# Patient Record
Sex: Female | Born: 1950
Health system: Southern US, Community
[De-identification: ages and names within clinical notes are randomized; demographics above are authoritative.]

## PROBLEM LIST (undated history)

## (undated) DIAGNOSIS — I10 Essential (primary) hypertension: Secondary | ICD-10-CM

## (undated) DIAGNOSIS — I341 Nonrheumatic mitral (valve) prolapse: Secondary | ICD-10-CM

## (undated) DIAGNOSIS — R7303 Prediabetes: Secondary | ICD-10-CM

## (undated) DIAGNOSIS — E119 Type 2 diabetes mellitus without complications: Secondary | ICD-10-CM

## (undated) DIAGNOSIS — E785 Hyperlipidemia, unspecified: Secondary | ICD-10-CM

## (undated) DIAGNOSIS — N200 Calculus of kidney: Secondary | ICD-10-CM

## (undated) HISTORY — DX: Nonrheumatic mitral (valve) prolapse: I34.1

## (undated) HISTORY — DX: Hyperlipidemia, unspecified: E78.5

## (undated) HISTORY — PX: TONSILLECTOMY AND ADENOIDECTOMY: SUR1326

## (undated) HISTORY — PX: OTHER SURGICAL HISTORY: SHX169

## (undated) HISTORY — DX: Calculus of kidney: N20.0

## (undated) HISTORY — PX: VAGINAL HYSTERECTOMY: SUR661

## (undated) HISTORY — DX: Type 2 diabetes mellitus without complications: E11.9

---

## 1978-07-08 HISTORY — PX: VAGINAL HYSTERECTOMY: SUR661

## 1997-12-27 ENCOUNTER — Encounter (INDEPENDENT_AMBULATORY_CARE_PROVIDER_SITE_OTHER): Payer: Self-pay | Admitting: Internal Medicine

## 1997-12-27 LAB — CONVERTED CEMR LAB: Pap Smear: NORMAL

## 1999-03-09 ENCOUNTER — Encounter (INDEPENDENT_AMBULATORY_CARE_PROVIDER_SITE_OTHER): Payer: Self-pay | Admitting: Internal Medicine

## 1999-03-09 LAB — CONVERTED CEMR LAB: Pap Smear: NORMAL

## 1999-03-19 ENCOUNTER — Other Ambulatory Visit: Admission: RE | Admit: 1999-03-19 | Discharge: 1999-03-19 | Payer: Self-pay | Admitting: Internal Medicine

## 2001-05-08 ENCOUNTER — Encounter (INDEPENDENT_AMBULATORY_CARE_PROVIDER_SITE_OTHER): Payer: Self-pay | Admitting: Internal Medicine

## 2001-05-08 LAB — CONVERTED CEMR LAB: Pap Smear: NORMAL

## 2001-05-28 ENCOUNTER — Other Ambulatory Visit: Admission: RE | Admit: 2001-05-28 | Discharge: 2001-05-28 | Payer: Self-pay | Admitting: Family Medicine

## 2004-01-06 ENCOUNTER — Encounter (INDEPENDENT_AMBULATORY_CARE_PROVIDER_SITE_OTHER): Payer: Self-pay | Admitting: Internal Medicine

## 2004-01-06 LAB — CONVERTED CEMR LAB
Pap Smear: NORMAL
Pap Smear: NORMAL

## 2004-01-25 ENCOUNTER — Other Ambulatory Visit: Admission: RE | Admit: 2004-01-25 | Discharge: 2004-01-25 | Payer: Self-pay | Admitting: Internal Medicine

## 2004-06-05 ENCOUNTER — Ambulatory Visit: Payer: Self-pay | Admitting: Family Medicine

## 2004-12-05 ENCOUNTER — Ambulatory Visit: Payer: Self-pay | Admitting: Internal Medicine

## 2005-01-28 ENCOUNTER — Emergency Department (HOSPITAL_COMMUNITY): Admission: EM | Admit: 2005-01-28 | Discharge: 2005-01-28 | Payer: Self-pay | Admitting: Emergency Medicine

## 2005-01-29 ENCOUNTER — Ambulatory Visit: Payer: Self-pay | Admitting: Internal Medicine

## 2005-03-05 ENCOUNTER — Ambulatory Visit: Payer: Self-pay | Admitting: Family Medicine

## 2005-03-13 ENCOUNTER — Ambulatory Visit: Payer: Self-pay | Admitting: Family Medicine

## 2005-06-07 ENCOUNTER — Ambulatory Visit: Payer: Self-pay | Admitting: Family Medicine

## 2005-06-18 ENCOUNTER — Ambulatory Visit: Payer: Self-pay | Admitting: Family Medicine

## 2005-12-12 ENCOUNTER — Ambulatory Visit: Payer: Self-pay | Admitting: Family Medicine

## 2006-08-01 ENCOUNTER — Ambulatory Visit: Payer: Self-pay | Admitting: Family Medicine

## 2006-08-25 ENCOUNTER — Ambulatory Visit: Payer: Self-pay | Admitting: Family Medicine

## 2006-08-27 ENCOUNTER — Ambulatory Visit: Payer: Self-pay | Admitting: Family Medicine

## 2006-11-10 ENCOUNTER — Telehealth (INDEPENDENT_AMBULATORY_CARE_PROVIDER_SITE_OTHER): Payer: Self-pay | Admitting: Internal Medicine

## 2006-11-11 ENCOUNTER — Ambulatory Visit: Payer: Self-pay | Admitting: Internal Medicine

## 2006-11-11 DIAGNOSIS — R002 Palpitations: Secondary | ICD-10-CM | POA: Insufficient documentation

## 2006-11-11 DIAGNOSIS — M8580 Other specified disorders of bone density and structure, unspecified site: Secondary | ICD-10-CM | POA: Insufficient documentation

## 2006-11-11 DIAGNOSIS — E785 Hyperlipidemia, unspecified: Secondary | ICD-10-CM | POA: Insufficient documentation

## 2006-11-12 ENCOUNTER — Ambulatory Visit: Payer: Self-pay | Admitting: Cardiology

## 2006-11-17 ENCOUNTER — Ambulatory Visit: Payer: Self-pay | Admitting: Cardiology

## 2006-12-08 ENCOUNTER — Ambulatory Visit: Payer: Self-pay | Admitting: Cardiology

## 2006-12-23 ENCOUNTER — Encounter: Payer: Self-pay | Admitting: Internal Medicine

## 2006-12-23 DIAGNOSIS — R87619 Unspecified abnormal cytological findings in specimens from cervix uteri: Secondary | ICD-10-CM | POA: Insufficient documentation

## 2006-12-23 DIAGNOSIS — F17200 Nicotine dependence, unspecified, uncomplicated: Secondary | ICD-10-CM | POA: Insufficient documentation

## 2006-12-23 DIAGNOSIS — N200 Calculus of kidney: Secondary | ICD-10-CM | POA: Insufficient documentation

## 2006-12-23 DIAGNOSIS — I059 Rheumatic mitral valve disease, unspecified: Secondary | ICD-10-CM | POA: Insufficient documentation

## 2006-12-25 ENCOUNTER — Ambulatory Visit: Payer: Self-pay | Admitting: Family Medicine

## 2006-12-25 DIAGNOSIS — E7800 Pure hypercholesterolemia, unspecified: Secondary | ICD-10-CM | POA: Insufficient documentation

## 2006-12-25 DIAGNOSIS — E669 Obesity, unspecified: Secondary | ICD-10-CM

## 2006-12-29 LAB — CONVERTED CEMR LAB
ALT: 28 units/L (ref 0–40)
AST: 22 units/L (ref 0–37)
BUN: 8 mg/dL (ref 6–23)
Basophils Absolute: 0 10*3/uL (ref 0.0–0.1)
Chloride: 111 meq/L (ref 96–112)
Cholesterol: 227 mg/dL (ref 0–200)
Eosinophils Absolute: 0.3 10*3/uL (ref 0.0–0.6)
GFR calc non Af Amer: 79 mL/min
HCT: 42.4 % (ref 36.0–46.0)
MCHC: 34.4 g/dL (ref 30.0–36.0)
MCV: 89 fL (ref 78.0–100.0)
Monocytes Absolute: 0.6 10*3/uL (ref 0.2–0.7)
Monocytes Relative: 8.6 % (ref 3.0–11.0)
Neutrophils Relative %: 46.8 % (ref 43.0–77.0)
Potassium: 4.7 meq/L (ref 3.5–5.1)
RBC: 4.76 M/uL (ref 3.87–5.11)
Sodium: 144 meq/L (ref 135–145)
Total CHOL/HDL Ratio: 4.7
Triglycerides: 95 mg/dL (ref 0–149)

## 2007-03-11 ENCOUNTER — Ambulatory Visit: Payer: Self-pay | Admitting: Internal Medicine

## 2007-03-17 ENCOUNTER — Ambulatory Visit: Payer: Self-pay | Admitting: Family Medicine

## 2007-03-17 ENCOUNTER — Encounter (INDEPENDENT_AMBULATORY_CARE_PROVIDER_SITE_OTHER): Payer: Self-pay | Admitting: Internal Medicine

## 2007-03-17 ENCOUNTER — Other Ambulatory Visit: Admission: RE | Admit: 2007-03-17 | Discharge: 2007-03-17 | Payer: Self-pay | Admitting: Family Medicine

## 2007-03-17 DIAGNOSIS — R030 Elevated blood-pressure reading, without diagnosis of hypertension: Secondary | ICD-10-CM | POA: Insufficient documentation

## 2007-03-20 ENCOUNTER — Encounter (INDEPENDENT_AMBULATORY_CARE_PROVIDER_SITE_OTHER): Payer: Self-pay | Admitting: Internal Medicine

## 2007-03-31 ENCOUNTER — Encounter (INDEPENDENT_AMBULATORY_CARE_PROVIDER_SITE_OTHER): Payer: Self-pay | Admitting: *Deleted

## 2007-03-31 LAB — CONVERTED CEMR LAB
AST: 18 units/L (ref 0–37)
Albumin: 3.7 g/dL (ref 3.5–5.2)
Basophils Absolute: 0 10*3/uL (ref 0.0–0.1)
Basophils Relative: 0.2 % (ref 0.0–1.0)
CO2: 31 meq/L (ref 19–32)
Chloride: 109 meq/L (ref 96–112)
Creatinine, Ser: 0.9 mg/dL (ref 0.4–1.2)
Eosinophils Relative: 5.5 % — ABNORMAL HIGH (ref 0.0–5.0)
Glucose, Bld: 97 mg/dL (ref 70–99)
HCT: 42 % (ref 36.0–46.0)
LDL Cholesterol: 106 mg/dL — ABNORMAL HIGH (ref 0–99)
Neutrophils Relative %: 49.1 % (ref 43.0–77.0)
RBC: 4.64 M/uL (ref 3.87–5.11)
RDW: 13.1 % (ref 11.5–14.6)
Sodium: 145 meq/L (ref 135–145)
TSH: 1.79 microintl units/mL (ref 0.35–5.50)
Total Bilirubin: 0.8 mg/dL (ref 0.3–1.2)
Total CHOL/HDL Ratio: 4
VLDL: 19 mg/dL (ref 0–40)
WBC: 7.8 10*3/uL (ref 4.5–10.5)

## 2007-05-05 ENCOUNTER — Telehealth (INDEPENDENT_AMBULATORY_CARE_PROVIDER_SITE_OTHER): Payer: Self-pay | Admitting: *Deleted

## 2007-08-31 ENCOUNTER — Telehealth (INDEPENDENT_AMBULATORY_CARE_PROVIDER_SITE_OTHER): Payer: Self-pay | Admitting: *Deleted

## 2007-09-14 ENCOUNTER — Ambulatory Visit: Payer: Self-pay | Admitting: Family Medicine

## 2007-09-15 LAB — CONVERTED CEMR LAB
ALT: 21 units/L (ref 0–35)
AST: 19 units/L (ref 0–37)
Total CHOL/HDL Ratio: 3.2

## 2007-09-24 ENCOUNTER — Encounter (INDEPENDENT_AMBULATORY_CARE_PROVIDER_SITE_OTHER): Payer: Self-pay | Admitting: Internal Medicine

## 2007-09-24 ENCOUNTER — Ambulatory Visit: Payer: Self-pay | Admitting: Family Medicine

## 2007-09-28 ENCOUNTER — Encounter (INDEPENDENT_AMBULATORY_CARE_PROVIDER_SITE_OTHER): Payer: Self-pay | Admitting: Internal Medicine

## 2007-09-28 DIAGNOSIS — N63 Unspecified lump in unspecified breast: Secondary | ICD-10-CM | POA: Insufficient documentation

## 2007-10-05 ENCOUNTER — Ambulatory Visit: Payer: Self-pay | Admitting: Family Medicine

## 2007-10-05 ENCOUNTER — Encounter (INDEPENDENT_AMBULATORY_CARE_PROVIDER_SITE_OTHER): Payer: Self-pay | Admitting: Internal Medicine

## 2008-01-14 ENCOUNTER — Encounter (INDEPENDENT_AMBULATORY_CARE_PROVIDER_SITE_OTHER): Payer: Self-pay | Admitting: Internal Medicine

## 2008-01-14 ENCOUNTER — Ambulatory Visit: Payer: Self-pay | Admitting: Family Medicine

## 2008-01-14 DIAGNOSIS — I10 Essential (primary) hypertension: Secondary | ICD-10-CM | POA: Insufficient documentation

## 2008-01-15 ENCOUNTER — Telehealth (INDEPENDENT_AMBULATORY_CARE_PROVIDER_SITE_OTHER): Payer: Self-pay | Admitting: Internal Medicine

## 2008-02-15 ENCOUNTER — Ambulatory Visit: Payer: Self-pay | Admitting: Family Medicine

## 2008-03-21 ENCOUNTER — Ambulatory Visit: Payer: Self-pay | Admitting: Family Medicine

## 2008-03-22 LAB — CONVERTED CEMR LAB
AST: 19 units/L (ref 0–37)
Alkaline Phosphatase: 62 units/L (ref 39–117)
HDL: 36.1 mg/dL — ABNORMAL LOW (ref >39.0)
LDL Cholesterol: 80 mg/dL (ref 0–99)
Total Bilirubin: 0.9 mg/dL (ref 0.3–1.2)
Total CHOL/HDL Ratio: 4

## 2008-05-17 ENCOUNTER — Ambulatory Visit: Payer: Self-pay | Admitting: Family Medicine

## 2008-05-17 DIAGNOSIS — L989 Disorder of the skin and subcutaneous tissue, unspecified: Secondary | ICD-10-CM | POA: Insufficient documentation

## 2008-05-17 DIAGNOSIS — L909 Atrophic disorder of skin, unspecified: Secondary | ICD-10-CM | POA: Insufficient documentation

## 2008-05-17 DIAGNOSIS — L919 Hypertrophic disorder of the skin, unspecified: Secondary | ICD-10-CM

## 2008-06-21 ENCOUNTER — Encounter (INDEPENDENT_AMBULATORY_CARE_PROVIDER_SITE_OTHER): Payer: Self-pay | Admitting: Internal Medicine

## 2008-08-25 ENCOUNTER — Ambulatory Visit: Payer: Self-pay | Admitting: Family Medicine

## 2008-08-25 ENCOUNTER — Encounter (INDEPENDENT_AMBULATORY_CARE_PROVIDER_SITE_OTHER): Payer: Self-pay | Admitting: Internal Medicine

## 2008-09-15 ENCOUNTER — Encounter (INDEPENDENT_AMBULATORY_CARE_PROVIDER_SITE_OTHER): Payer: Self-pay | Admitting: Internal Medicine

## 2008-09-20 ENCOUNTER — Ambulatory Visit: Payer: Self-pay | Admitting: Family Medicine

## 2008-09-20 LAB — CONVERTED CEMR LAB
AST: 18 units/L (ref 0–37)
Albumin: 3.7 g/dL (ref 3.5–5.2)
BUN: 10 mg/dL (ref 6–23)
Calcium: 9.3 mg/dL (ref 8.4–10.5)
Cholesterol: 144 mg/dL (ref 0–200)
GFR calc non Af Amer: 68.56 mL/min (ref >60)
HDL: 45.4 mg/dL (ref >39.00)
LDL Cholesterol: 79 mg/dL (ref 0–99)
Potassium: 4.3 meq/L (ref 3.5–5.1)
Total Bilirubin: 1 mg/dL (ref 0.3–1.2)
VLDL: 19.2 mg/dL (ref 0.0–40.0)

## 2008-09-28 ENCOUNTER — Ambulatory Visit: Payer: Self-pay | Admitting: Family Medicine

## 2008-10-05 ENCOUNTER — Encounter (INDEPENDENT_AMBULATORY_CARE_PROVIDER_SITE_OTHER): Payer: Self-pay | Admitting: Internal Medicine

## 2008-10-05 ENCOUNTER — Ambulatory Visit: Payer: Self-pay | Admitting: Family Medicine

## 2008-10-13 ENCOUNTER — Encounter (INDEPENDENT_AMBULATORY_CARE_PROVIDER_SITE_OTHER): Payer: Self-pay | Admitting: *Deleted

## 2008-10-13 ENCOUNTER — Encounter (INDEPENDENT_AMBULATORY_CARE_PROVIDER_SITE_OTHER): Payer: Self-pay | Admitting: Internal Medicine

## 2008-10-18 ENCOUNTER — Telehealth (INDEPENDENT_AMBULATORY_CARE_PROVIDER_SITE_OTHER): Payer: Self-pay | Admitting: Internal Medicine

## 2009-01-05 ENCOUNTER — Ambulatory Visit: Payer: Self-pay | Admitting: Family Medicine

## 2009-08-11 ENCOUNTER — Ambulatory Visit: Payer: Self-pay | Admitting: Family Medicine

## 2009-08-11 DIAGNOSIS — M48061 Spinal stenosis, lumbar region without neurogenic claudication: Secondary | ICD-10-CM | POA: Insufficient documentation

## 2009-08-11 DIAGNOSIS — S335XXA Sprain of ligaments of lumbar spine, initial encounter: Secondary | ICD-10-CM

## 2009-10-11 ENCOUNTER — Telehealth: Payer: Self-pay | Admitting: Family Medicine

## 2009-11-20 ENCOUNTER — Telehealth (INDEPENDENT_AMBULATORY_CARE_PROVIDER_SITE_OTHER): Payer: Self-pay | Admitting: *Deleted

## 2009-11-21 ENCOUNTER — Ambulatory Visit: Payer: Self-pay | Admitting: Family Medicine

## 2009-11-22 LAB — CONVERTED CEMR LAB
Albumin: 3.9 g/dL (ref 3.5–5.2)
BUN: 13 mg/dL (ref 6–23)
Calcium: 9.1 mg/dL (ref 8.4–10.5)
Cholesterol: 145 mg/dL (ref 0–200)
Creatinine, Ser: 0.8 mg/dL (ref 0.4–1.2)
GFR calc non Af Amer: 73.93 mL/min (ref >60)
Glucose, Bld: 86 mg/dL (ref 70–99)
HDL: 47.4 mg/dL (ref >39.00)
LDL Cholesterol: 79 mg/dL (ref 0–99)
Potassium: 4.7 meq/L (ref 3.5–5.1)
Total Bilirubin: 0.5 mg/dL (ref 0.3–1.2)
Triglycerides: 92 mg/dL (ref 0.0–149.0)
VLDL: 18.4 mg/dL (ref 0.0–40.0)

## 2009-11-27 ENCOUNTER — Ambulatory Visit: Payer: Self-pay | Admitting: Family Medicine

## 2009-11-27 ENCOUNTER — Other Ambulatory Visit: Admission: RE | Admit: 2009-11-27 | Discharge: 2009-11-27 | Payer: Self-pay | Admitting: Family Medicine

## 2009-11-28 ENCOUNTER — Encounter: Payer: Self-pay | Admitting: Family Medicine

## 2009-11-28 ENCOUNTER — Ambulatory Visit: Payer: Self-pay | Admitting: Family Medicine

## 2009-11-29 ENCOUNTER — Encounter: Payer: Self-pay | Admitting: Family Medicine

## 2009-11-30 ENCOUNTER — Encounter: Payer: Self-pay | Admitting: Family Medicine

## 2009-11-30 LAB — CONVERTED CEMR LAB

## 2010-05-25 ENCOUNTER — Ambulatory Visit: Payer: Self-pay | Admitting: Family Medicine

## 2010-05-25 DIAGNOSIS — J0100 Acute maxillary sinusitis, unspecified: Secondary | ICD-10-CM | POA: Insufficient documentation

## 2010-08-05 LAB — CONVERTED CEMR LAB
BUN: 8 mg/dL (ref 6–23)
CO2: 31 meq/L (ref 19–32)
Calcium: 9.2 mg/dL (ref 8.4–10.5)
Chloride: 104 meq/L (ref 96–112)
Creatinine, Ser: 0.9 mg/dL (ref 0.4–1.2)
HCT: 40.9 % (ref 36.0–46.0)
MCHC: 34.5 g/dL (ref 30.0–36.0)
Potassium: 4.1 meq/L (ref 3.5–5.1)
RDW: 12.9 % (ref 11.5–14.6)
TSH: 1.23 microintl units/mL (ref 0.35–5.50)
WBC: 6.9 10*3/uL (ref 4.5–10.5)

## 2010-08-07 NOTE — Assessment & Plan Note (Signed)
Summary: ?SINUS INFECTION/CLE   Vital Signs:  Patient profile:   60 year old female Height:      64.5 inches Weight:      186 pounds BMI:     31.55 Temp:     98.6 degrees F oral Pulse rate:   72 / minute Pulse rhythm:   regular BP sitting:   130 / 90  (left arm) Cuff size:   regular  Vitals Entered By: Linde Gillis CMA Duncan Dull) (May 25, 2010 12:39 PM) CC: ? sinus infection   History of Present Illness: 60 yo here old with ? sinus infection.  Had  a cold last week- runny nose, sneezing, dry cough.  Was just letting it runs it course but over past two days, has become febrile Tmax 101.2, body aches, facial pressure and malaise.  Taking OTC decongestant and Ibuprofen with mild relief of symptoms.  Current Medications (verified): 1)  Lipitor 10 Mg Tabs (Atorvastatin Calcium) .Marland Kitchen.. 1 By Mouth Once Daily 2)  Fish Oil 1000 Mg Caps (Omega-3 Fatty Acids) .Marland Kitchen.. 1 By Mouth Daily 3)  Daily Multiple Vitamins  Tabs (Multiple Vitamin) .Marland Kitchen.. 1 By Mouth Daily 4)  Caltrate 600   Tabs (Calcium Carbonate Tabs) .... 2 Once Daily 5)  Lisinopril 10 Mg  Tabs (Lisinopril) .Marland Kitchen.. 1 Once Daily For Bp By Mouth 6)  Augmentin 875-125 Mg Tabs (Amoxicillin-Pot Clavulanate) .Marland Kitchen.. 1 By Mouth 2 Times Daily X 10 Days  Allergies (verified): No Known Drug Allergies  Past History:  Past Medical History: Last updated: 11/11/2006 Hyperlipidemia Osteopenia MVP Renal stones  Abn pap Tobacco abuse  Past Surgical History: Last updated: 11/11/2006 Hysterectomy--class IV, abn pap.  OVARIES intact   ~1977 Tonsillectomy and adenoids Extraction of renal stones  ages 40 and 50 DEXA 7/05, 08/01/06  Family History: Last updated: 12/23/2006 Father: Alive 86, CVA age 9 Mother: Alive 53, high BP Siblings: No brothers, 1 sister L&W Grandparents:  MGM died (46) osteoporosis, CHF, MI (70's/80's) MGF died (97) CVA x 1;  PGM died 24 MI (#1) - high BP PGF died 58-65 CA lung - smoker Breast CA:  M G.Aunt,  lung  Social History: Last updated: 12/25/2006 Marital Status: Married #2 - 13 years Children: 2 (30 & 24) Occupation: Museum/gallery exhibitions officer Current Smoker, 1/2 PPD---quit 6/08 Alcohol use-yes, occasional Regular exercise-yes, walks   Risk Factors: Alcohol Use: <1 (09/28/2008) Caffeine Use: 3 (09/28/2008) Exercise: no (09/28/2008)  Risk Factors: Smoking Status: quit (09/28/2008) Packs/Day: 3/4 (09/28/2008) Passive Smoke Exposure: no (01/14/2008)  Review of Systems      See HPI General:  Complains of fever, malaise, and sweats. ENT:  Complains of nasal congestion, postnasal drainage, sinus pressure, and sore throat. Resp:  Complains of cough; denies pleuritic, shortness of breath, sputum productive, and wheezing.  Physical Exam  General:  alert, well-developed, well-nourished, and well-hydrated.   Ears:  TMs retracted bilaterally, right > left Nose:  boggy turbinates, maxillary sinuses TTP, right> left Mouth:  pharyngeal erythema.   Neck:  No deformities, masses, or tenderness noted. Lungs:  Normal respiratory effort, chest expands symmetrically. Lungs are clear to auscultation, no crackles or wheezes. Heart:  Normal rate and regular rhythm. S1 and S2 normal without gallop, murmur, click, rub or other extra sounds. Extremities:  no edema Psych:  memory intact for recent and remote, normally interactive, good eye contact, not anxious appearing, and not depressed appearing.     Impression & Recommendations:  Problem # 1:  ACUTE MAXILLARY SINUSITIS (ICD-461.0) Assessment New Given duration  and progression of symtpoms, will treat for bacterial sinusitis with Augmentin. Supportive care as per pt instructions. Her updated medication list for this problem includes:    Augmentin 875-125 Mg Tabs (Amoxicillin-pot clavulanate) .Marland Kitchen... 1 by mouth 2 times daily x 10 days  Complete Medication List: 1)  Lipitor 10 Mg Tabs (Atorvastatin calcium) .Marland Kitchen.. 1 by mouth once daily 2)  Fish Oil  1000 Mg Caps (Omega-3 fatty acids) .Marland Kitchen.. 1 by mouth daily 3)  Daily Multiple Vitamins Tabs (Multiple vitamin) .Marland Kitchen.. 1 by mouth daily 4)  Caltrate 600 Tabs (Calcium carbonate tabs) .... 2 once daily 5)  Lisinopril 10 Mg Tabs (Lisinopril) .Marland Kitchen.. 1 once daily for bp by mouth 6)  Augmentin 875-125 Mg Tabs (Amoxicillin-pot clavulanate) .Marland Kitchen.. 1 by mouth 2 times daily x 10 days  Patient Instructions: 1)  Take antibiotic as directed.  Drink lots of fluids.  Treat sympotmatically with Mucinex, nasal saline irrigation, and Tylenol/Ibuprofen. Also try claritin D or zyrtec D over the counter- two times a day as needed ( have to sign for them at pharmacy). You can use warm compresses.  Cough suppressant at night. Call if not improving as expected in 5-7 days.  Prescriptions: AUGMENTIN 875-125 MG TABS (AMOXICILLIN-POT CLAVULANATE) 1 by mouth 2 times daily x 10 days  #20 x 0   Entered and Authorized by:   Ruthe Mannan MD   Signed by:   Ruthe Mannan MD on 05/25/2010   Method used:   [facility]        [facility]  [address] [id number]       [address]       [place], [place]  [address]       Ph: [phone] or [phone]       Fax: [phone]   RxID:   [id number]    Orders Added: 1)  Est. Patient Level III [36644]    Current Allergies (reviewed today): No known allergies

## 2010-08-07 NOTE — Letter (Signed)
Summary: Results Follow up Letter  [place] at [facility]  [address] [place], [place] [address]   Phone: [phone]  Fax: [phone]    11/30/2009 MRN: [id number]  [facility] [address] [place], [place]  [address]  Dear Ms. Oblinger,  The following are the results of your recent test(s):  Test         Result    Pap Smear:        Normal __X___  Not Normal _____ Comments: ______________________________________________________ Cholesterol: LDL(Bad cholesterol):         Your goal is less than:         HDL (Good cholesterol):       Your goal is more than: Comments:  ______________________________________________________ Mammogram:        Normal _____  Not Normal _____ Comments:  ___________________________________________________________________ Hemoccult:        Normal _____  Not normal _______ Comments:    _____________________________________________________________________ Other Tests:    We routinely do not discuss normal results over the telephone.  If you desire a copy of the results, or you have any questions about this information we can discuss them at your next office visit.   Sincerely,        Ruthe Mannan, MD

## 2010-08-07 NOTE — Letter (Signed)
Summary: Results Follow up Letter  [place] at [facility]  [address] [place], [place] [address]   Phone: [phone]  Fax: [phone]    11/29/2009 MRN: [id number]  [facility] [address] [place], [place]  [address]  Dear Ms. Shiner,  The following are the results of your recent test(s):  Test         Result    Pap Smear:        Normal _____  Not Normal _____ Comments: ______________________________________________________ Cholesterol: LDL(Bad cholesterol):         Your goal is less than:         HDL (Good cholesterol):       Your goal is more than: Comments:  ______________________________________________________ Mammogram:        Normal __X___  Not Normal _____ Comments:  ___________________________________________________________________ Hemoccult:        Normal _____  Not normal _______ Comments:    _____________________________________________________________________ Other Tests:    We routinely do not discuss normal results over the telephone.  If you desire a copy of the results, or you have any questions about this information we can discuss them at your next office visit.   Sincerely,       Ruthe Mannan, MD

## 2010-08-07 NOTE — Assessment & Plan Note (Signed)
Summary: LOWER BACK PAIN/BILLIES PATIENT/[facility]   Vital Signs:  Patient profile:   60 year old female Height:      64.5 inches Weight:      182.50 pounds BMI:     30.95 Temp:     98.5 degrees F oral Pulse rate:   80 / minute Pulse rhythm:   regular BP sitting:   140 / 90  (left arm) Cuff size:   large  Vitals Entered By: Delilah Shan CMA Duncan Dull) (August 11, 2009 2:06 PM) CC: LBP   History of Present Illness: 60 yo female with 4 days of low back pain. No known trauma or injury. Woke up with pain in her lower back, bilateral. Worse when sitting or standing, tends to do better with walking. No tingling or weakness into her legs. No urinary incontinence. Never had anything like this before. No fevers. Taking Ibuprofen 800 mg three times a day with very minimal relief of symptoms.  Has been under a lot of stress, husband recently diagnosed with bladder cancer.  Current Medications (verified): 1)  Lipitor 10 Mg Tabs (Atorvastatin Calcium) .Marland Kitchen.. 1 By Mouth Once Daily 2)  Fish Oil 1000 Mg Caps (Omega-3 Fatty Acids) .Marland Kitchen.. 1 By Mouth Daily 3)  Daily Multiple Vitamins  Tabs (Multiple Vitamin) .Marland Kitchen.. 1 By Mouth Daily 4)  Caltrate 600   Tabs (Calcium Carbonate Tabs) .... 2 Once Daily 5)  Lisinopril 10 Mg  Tabs (Lisinopril) .Marland Kitchen.. 1 Once Daily For Bp By Mouth 6)  Soma 350 Mg Tabs (Carisoprodol) .Marland Kitchen.. 1 Tab By Mouth Three Times A Day As Needed Back Pain  Allergies (verified): No Known Drug Allergies  Review of Systems      See HPI General:  Denies chills, fatigue, fever, and malaise. GU:  Denies incontinence and urinary frequency. MS:  Complains of low back pain; denies loss of strength and muscle weakness.  Physical Exam  General:  alert, well-developed, well-nourished, and well-hydrated.   Lungs:     Msk:  Back: Normal inspection, no tenderness to palpation over paraspinous muscles, very tight lower lumbar parapspinous muscles. FROM -extension and flexion. SLR neg bilaterally. Neg  fabers bilaterally.   Impression & Recommendations:  Problem # 1:  LUMBAR STRAIN, ACUTE (ICD-847.2) Assessment New No red flag symptoms. Will give muscle relaxant, encourage exercises, Ibuprofen and heat. See pt instrucitons for details. To let me know if develops any red flag symptoms, including radiculopathy that would indicate need for possible imaging.  Complete Medication List: 1)  Lipitor 10 Mg Tabs (Atorvastatin calcium) .Marland Kitchen.. 1 by mouth once daily 2)  Fish Oil 1000 Mg Caps (Omega-3 fatty acids) .Marland Kitchen.. 1 by mouth daily 3)  Daily Multiple Vitamins Tabs (Multiple vitamin) .Marland Kitchen.. 1 by mouth daily 4)  Caltrate 600 Tabs (Calcium carbonate tabs) .... 2 once daily 5)  Lisinopril 10 Mg Tabs (Lisinopril) .Marland Kitchen.. 1 once daily for bp by mouth 6)  Soma 350 Mg Tabs (Carisoprodol) .Marland Kitchen.. 1 tab by mouth three times a day as needed back pain  Patient Instructions: 1)  Most patients (90%) with low back pain will improve with time ( 2-6 weeks). Keep active but avoid activities that are painful. Apply moist heat and/or ice to lower back several times a day.  2)  Soma three times a day as needed (muscle relaxant). 3)  Tell Mr. Mccartney I said hello and keep smiling! Prescriptions: SOMA 350 MG TABS (CARISOPRODOL) 1 tab by mouth three times a day as needed back pain  #30 x 0  Entered and Authorized by:   Ruthe Mannan MD   Signed by:   Ruthe Mannan MD on 08/11/2009   Method used:   [facility]        [facility]  [address]       [address]       [place], [place]  [address]       Ph: [phone] or [phone]       Fax: [phone]   RxID:   [id number]   Current Allergies (reviewed today): No known allergies

## 2010-08-07 NOTE — Progress Notes (Signed)
Summary: refill requests for lipitor, lisinopril  Phone Note Refill Request Message from:  Fax from Pharmacy  Refills Requested: Medication #1:  LISINOPRIL 10 MG  TABS 1 once daily for BP by mouth  Medication #2:  LIPITOR 10 MG TABS 1 by mouth once daily Faxed forms from [facility] are on your desk.  Initial call taken by: Lowella Petties CMA,  October 11, 2009 8:57 AM  Follow-up for Phone Call        On my desk. Follow-up by: Ruthe Mannan MD,  October 11, 2009 9:39 AM  Additional Follow-up for Phone Call Additional follow up Details #1::        Faxed Additional Follow-up by: Delilah Shan CMA (AAMA),  October 11, 2009 11:13 AM

## 2010-08-07 NOTE — Progress Notes (Signed)
----   Converted from flag ---- ---- 11/20/2009 9:33 AM, Ruthe Mannan MD wrote: BMET (401.9), fasting lipid/hepatic panel (272.0)  ---- 11/20/2009 8:14 AM, Liane Comber CMA (AAMA) wrote:  Pt is scheduled for cpx labs tomorrow, what labs to draw and dx codes? Thanks Tasha ------------------------------

## 2010-08-07 NOTE — Assessment & Plan Note (Signed)
Summary: cpx and estabh from [facility]   Vital Signs:  Patient profile:   60 year old female Height:      64.5 inches Weight:      183.13 pounds BMI:     31.06 Temp:     98.6 degrees F oral Pulse rate:   76 / minute Pulse rhythm:   regular BP sitting:   120 / 80  (left arm) Cuff size:   regular  Vitals Entered By: Linde Gillis CMA Duncan Dull) (Nov 27, 2009 8:36 AM) CC: complete physical, establish for Billie   History of Present Illness: 60 yo here to establish care and CPX.  HDL- well controlled on Lipitor 10 mg daily. HDL 47, LDL 79, TG 92. No myalgias or other side effects. Working on diet as well.  Osteopenia- dexa in 08/2008- osteopenia. Taking Catriate 1200 mg daily.  Well woman- due for tetanus, colonoscopy, mammogram. Had a hysterectomy due to heavy bleeding in 1976 but unsure if she still has cervix.  Current Medications (verified): 1)  Lipitor 10 Mg Tabs (Atorvastatin Calcium) .Marland Kitchen.. 1 By Mouth Once Daily 2)  Fish Oil 1000 Mg Caps (Omega-3 Fatty Acids) .Marland Kitchen.. 1 By Mouth Daily 3)  Daily Multiple Vitamins  Tabs (Multiple Vitamin) .Marland Kitchen.. 1 By Mouth Daily 4)  Caltrate 600   Tabs (Calcium Carbonate Tabs) .... 2 Once Daily 5)  Lisinopril 10 Mg  Tabs (Lisinopril) .Marland Kitchen.. 1 Once Daily For Bp By Mouth  Allergies (verified): No Known Drug Allergies  Past History:  Past Medical History: Last updated: 11/11/2006 Hyperlipidemia Osteopenia MVP Renal stones  Abn pap Tobacco abuse  Past Surgical History: Last updated: 11/11/2006 Hysterectomy--class IV, abn pap.  OVARIES intact   ~1977 Tonsillectomy and adenoids Extraction of renal stones  ages 34 and 69 DEXA 7/05, 08/01/06  Family History: Last updated: 12/23/2006 Father: Alive 6, CVA age 36 Mother: Alive 44, high BP Siblings: No brothers, 1 sister L&W Grandparents:  MGM died (60) osteoporosis, CHF, MI (70's/80's) MGF died (39) CVA x 1;  PGM died 37 MI (#1) - high BP PGF died 75-65 CA lung - smoker Breast CA:  M  G.Aunt, lung  Social History: Last updated: 12/25/2006 Marital Status: Married #2 - 13 years Children: 2 (30 & 24) Occupation: Museum/gallery exhibitions officer Current Smoker, 1/2 PPD---quit 6/08 Alcohol use-yes, occasional Regular exercise-yes, walks   Risk Factors: Alcohol Use: <1 (09/28/2008) Caffeine Use: 3 (09/28/2008) Exercise: no (09/28/2008)  Risk Factors: Smoking Status: quit (09/28/2008) Packs/Day: 3/4 (09/28/2008) Passive Smoke Exposure: no (01/14/2008)  Family History: Reviewed history from 12/23/2006 and no changes required. Father: Alive 12, CVA age 36 Mother: Alive 61, high BP Siblings: No brothers, 1 sister L&W Grandparents:  MGM died (72) osteoporosis, CHF, MI (70's/80's) MGF died (28) CVA x 1;  PGM died 11 MI (#1) - high BP PGF died 22-65 CA lung - smoker Breast CA:  M G.Aunt, lung  Social History: Reviewed history from 12/25/2006 and no changes required. Marital Status: Married #2 - 13 years Children: 2 (30 & 24) Occupation: realestate appraisor Current Smoker, 1/2 PPD---quit 6/08 Alcohol use-yes, occasional Regular exercise-yes, walks   Review of Systems      See HPI General:  Denies malaise. Eyes:  Denies blurring. ENT:  Denies difficulty swallowing. CV:  Denies chest pain or discomfort and difficulty breathing at night. Resp:  Denies shortness of breath. GI:  Denies abdominal pain, bloody stools, and change in bowel habits. GU:  Denies abnormal vaginal bleeding. MS:  Denies joint pain, joint redness,  and joint swelling. Derm:  Denies rash. Neuro:  Denies headaches. Psych:  Denies anxiety and depression. Endo:  Denies cold intolerance and heat intolerance. Heme:  Denies abnormal bruising and bleeding.  Physical Exam  General:  alert, well-developed, well-nourished, and well-hydrated.   Head:  Normocephalic and atraumatic without obvious abnormalities. No apparent alopecia or balding. Eyes:  vision grossly intact, pupils equal, and pupils round.     Ears:  R ear normal and L ear normal.   Nose:  no external deformity.   Mouth:  good dentition and no gingival abnormalities.   Breasts:  No mass, nodules, thickening, tenderness, bulging, retraction, inflamation, nipple discharge or skin changes noted.   Lungs:  Normal respiratory effort, chest expands symmetrically. Lungs are clear to auscultation, no crackles or wheezes. Heart:  Normal rate and regular rhythm. S1 and S2 normal without gallop, murmur, click, rub or other extra sounds. Abdomen:  Bowel sounds positive,abdomen soft and non-tender without masses, organomegaly or hernias noted. Genitalia:  Pelvic Exam:        External: normal female genitalia without lesions or masses        Vagina: normal without lesions or masses        Cervix: absent        Adnexa: normal bimanual exam without masses or fullness        Uterus: absent        Pap smear: performed Extremities:  no edema Neurologic:  alert & oriented X3.   Skin:  Intact without suspicious lesions or rashes Psych:  memory intact for recent and remote, normally interactive, good eye contact, not anxious appearing, and not depressed appearing.     Impression & Recommendations:  Problem # 1:  WELL ADULT (ICD-V70.0) Reviewed preventive care protocols, scheduled due services, and updated immunizations Discussed nutrition, exercise, diet, and healthy lifestyle.  tetanus today. Set up mammogram and colonscopy today.  Complete Medication List: 1)  Lipitor 10 Mg Tabs (Atorvastatin calcium) .Marland Kitchen.. 1 by mouth once daily 2)  Fish Oil 1000 Mg Caps (Omega-3 fatty acids) .Marland Kitchen.. 1 by mouth daily 3)  Daily Multiple Vitamins Tabs (Multiple vitamin) .Marland Kitchen.. 1 by mouth daily 4)  Caltrate 600 Tabs (Calcium carbonate tabs) .... 2 once daily 5)  Lisinopril 10 Mg Tabs (Lisinopril) .Marland Kitchen.. 1 once daily for bp by mouth  Other Orders: Pap Smear, Thin Prep ( Collection of) (G2694) Radiology Referral (Radiology) Gastroenterology Referral (GI) Tdap =>  54yrs IM ([address]462) Admin 1st Vaccine (70350)  Patient Instructions: 1)  Great to see you. 2)  Please tell Mr. Levings that I said hello. 3)  Please stop by to see Shirlee Limerick on your way out to set up your mammogram and colonoscopy.  Current Allergies (reviewed today): No known allergies     Tetanus Vaccine (to be given today)

## 2010-10-11 ENCOUNTER — Other Ambulatory Visit: Payer: Self-pay | Admitting: *Deleted

## 2010-10-11 MED ORDER — ATORVASTATIN CALCIUM 10 MG PO TABS: 10.0000 mg | ORAL_TABLET | Freq: Every day | ORAL | Status: DC

## 2010-10-11 MED ORDER — LISINOPRIL 10 MG PO TABS: 10.0000 mg | ORAL_TABLET | Freq: Every day | ORAL | Status: DC

## 2010-10-19 ENCOUNTER — Encounter: Payer: Self-pay | Admitting: Family Medicine

## 2010-10-19 LAB — HM MAMMOGRAPHY

## 2010-10-24 ENCOUNTER — Encounter: Payer: Self-pay | Admitting: Family Medicine

## 2010-10-24 ENCOUNTER — Ambulatory Visit (INDEPENDENT_AMBULATORY_CARE_PROVIDER_SITE_OTHER): Payer: Managed Care, Other (non HMO) | Admitting: Family Medicine

## 2010-10-24 DIAGNOSIS — L821 Other seborrheic keratosis: Secondary | ICD-10-CM | POA: Insufficient documentation

## 2010-10-24 DIAGNOSIS — M722 Plantar fascial fibromatosis: Secondary | ICD-10-CM

## 2010-10-24 NOTE — Progress Notes (Signed)
60 yo here for:  1.  Spot on right face- noticed it several months ago, has not changed in size. Has a similar one on her stomach for years.  Neither one are growing in size, bleeding or itching.  2.  Left foot pain- started several weeks ago.  Hurts from heel to mid foot. First step of the morning is the worst. No swelling, redness or LE weakness.  The PMH, PSH, Social History, Family History, Medications, and allergies have been reviewed in [facility], and have been updated if relevant..  ROS: See HPI  PHysical exam:  BP 110/70  Pulse 60  Temp(Src) 98.3 F (36.8 C) (Oral)  Ht 5\' 6"  (1.676 m)  Wt 186 lb 1.9 oz (84.423 kg)  BMI 30.04 kg/m2  General:  Well-developed,well-nourished,in no acute distress; alert,appropriate and cooperative throughout examination Head:  normocephalic and atraumatic.   Eyes:  vision grossly intact, pupils equal, pupils round, and pupils reactive to light.   Ears:  R ear normal and L ear normal.   Nose:  no external deformity.   Mouth:  good dentition.   Neck:  No deformities, masses, or tenderness noted. Msk:  No deformity of feet, can illict pain with streching foot from left toes, normal stability. Extremities:  No clubbing, cyanosis, edema, or deformity noted with normal full range of motion of all joints.   Neurologic:  alert & oriented X3 and gait normal.   Skin:  I.5 cm lesion on right face Cervical Nodes:  No lymphadenopathy noted Axillary Nodes:  No palpable lymphadenopathy Psych:  Cognition and judgment appear intact. Alert and cooperative with normal attention span and concentration. No apparent delusions, illusions, hallucinations

## 2010-10-24 NOTE — Assessment & Plan Note (Signed)
New. Reassurance provided. Discussed need for immediate follow up if lesion changes in size, color, or shape.

## 2010-10-24 NOTE — Patient Instructions (Addendum)
Plantar Fasciitis Plantar fasciitis is a common condition that causes foot pain. It is soreness (inflammation) of the band of tough fibrous tissue on the bottom of the foot that runs from the heel bone (calcaneus) to the ball of the foot. The cause of this soreness may be from excessive standing, poor fitting shoes, running on hard surfaces, being overweight, having an abnormal walk, or overuse (this is common in runners) of the painful foot or feet. It is also common in aerobic exercise dancers and ballet dancers.  SYMPTOMS Most people with plantar fasciitis complain of:  Severe pain in the morning on the bottom of their foot especially when taking the first steps out of bed. This pain recedes after a few minutes of walking.   Severe pain is experienced also during walking following a long period of inactivity.   Pain is worse when walking barefoot or up stairs  DIAGNOSIS  Your caregiver will diagnose this condition by examining and feeling your foot.   Special tests such as x-rays of your foot, are usually not needed.  PREVENTION  Consult a sports medicine professional before beginning a new exercise program.   Walking programs offer a good workout. With walking there is a lower chance of overuse injuries common to runners. There is less impact and less jarring of the joints.   Begin all new exercise programs slowly. If problems or pain develop, decrease the amount of time or distance until you are at a comfortable level.   Wear good shoes and replace them regularly.   Stretch your foot and the heel cords at the back of the ankle (Achilles tendon) both before and after exercise.   Run or exercise on even surfaces that are not hard. For example, asphalt is better than pavement.   Do not run barefoot on hard surfaces.   If using a treadmill, vary the incline.   Do not continue to workout if you have foot or joint problems. Seek professional help if they do not improve.  HOME CARE  INSTRUCTIONS  Avoid activities that cause you pain until you recover.   Use ice or cold packs on the problem or painful areas after working out.   Only take over-the-counter or prescription medicines for pain, discomfort, or fever as directed by your caregiver.   Soft shoe inserts or athletic shoes with air or gel sole cushions may be helpful.   If problems continue or become more severe, consult a sports medicine caregiver or your own health care provider. Cortisone is a potent anti-inflammatory medication that may be injected into the painful area. You can discuss this treatment with your caregiver.  MAKE SURE YOU:  Understand these instructions.   Will watch your condition.   Will get help right away if you are not doing well or get worse.    [email or web address]

## 2010-10-24 NOTE — Assessment & Plan Note (Signed)
New. Discussed treatment plan. See pt instructions for details.

## 2010-11-20 NOTE — Assessment & Plan Note (Signed)
[facility]                            [place] OFFICE NOTE   NAME:Jacqueline [name], Jacqueline [name]                           MRN:          [id number]  DATE:11/12/2006                            DOB:          05/13/1951    PRIMARY CARE PHYSICIAN:  Arta Silence, M.D.   REASON FOR PRESENTATION:  Evaluate patient with palpitations.   HISTORY OF PRESENT ILLNESS:  Patient is a pleasant, 60 year old white  female.  She does have a history of mitral valve prolapse, documented  about 20 years ago.  She has not been bothered by this over the years  and has taken no medications for this.  She presents because she has had  about two weeks of rapid heartbeat.  This occurs sporadically.  It may  happen a couple of times a day, or not at all.  It usually happens at  rest.  She says she will feel her heart pounding.  It may go on for 10-  30 minutes.  On one occasion, she felt nauseated.  She has felt a little  light-headed, but had no presyncope or syncope.  She has had no  vomiting.  She has had no shortness of breath.  She has no chest  pressure, neck or arm discomfort.  She cannot bring these on.  She  simply waits and they go away.  She has never had symptoms like this  before.   PAST MEDICAL HISTORY:  Borderline dyslipidemia, tobacco abuse.   PAST SURGICAL HISTORY:  Hysterectomy.   ALLERGIES:  None.   MEDICATIONS:  1. Lipitor 10 mg daily.  2. Multivitamin.  3. Calcium.  4. Fish oil.   SOCIAL HISTORY:  The patient is married.  She has two children.  She is  an Event organiser.  She has been smoking cigarettes for 20 years and  currently smokes about a quarter pack per day.  She rarely drinks  alcohol.   FAMILY HISTORY:  Noncontributory for early coronary artery disease,  sudden cardiac death, heart failure, syncope.   REVIEW OF SYSTEMS:  As stated in the HPI and negative for other systems.   PHYSICAL EXAM:  The patient is in no distress.  Blood pressure 142/86,  heart rate 68 and regular.  HEENT:  Eyes unremarkable.  Pupils were equal, round and react to light.  Fundi within normal limits.  Oral mucosa unremarkable.  NECK:  No jugular venous distention, wave form within normal limits.  Carotid upstroke brisk and symmetric.  No bruits, no thyromegaly.  LYMPHATICS:  No cervical, axillary or inguinal adenopathy.  LUNGS:  Clear to auscultation bilaterally.  BACK:  No costovertebral angle tenderness.  CHEST:  Unremarkable.  HEART:  PMI not displaced or sustained.  S1 and S2 within normal limits.  No S3, no S4, no clicks, no rubs, no murmurs.  ABDOMEN:  Flat, positive bowel sounds, normal in frequency and pitch.  No bruits, rebound, guarding.  No midline pulse, no mass, no  hepatomegaly, no splenomegaly.  SKIN:  No rashes, no nodules.  EXTREMITIES:  Two-plus pulses throughout, no edema, no cyanosis,  no  clubbing.  NEUROLOGIC:  Oriented to person, place and time.  Cranial nerves II  through XII grossly intact.  Motor grossly intact throughout.   EKG (Nov 11, 2006):  Sinus rhythm, axis within normal limits, intervals  within normal limits, no acute STT-wave changes.   ASSESSMENT AND PLAN:  1. Palpitations:  Patient has palpitations that may be a      supraventricular tachycardia.  She has had some labs drawn, to      include thyroid.  These are pending.  She is going to get a two-      week event monitor.  We discussed vagal maneuvers that could help.  2. Tobacco:  We had a long discussion about the need to stop smoking      (greater than three minutes).  She was given a prescription for      Chantix.  3. Followup:  I will see her back in about three weeks or sooner, if      she has any symptomatic tachyarrhythmias in need of immediate      attention.     Rollene Rotunda, MD, [facility]     JH/MedQ  DD: 11/12/2006  DT: 11/12/2006  Job #: [id number]   cc:   Willaim Sheng D. Bean, FNP

## 2010-11-20 NOTE — Assessment & Plan Note (Signed)
[facility]                            [place] OFFICE NOTE   NAME:Jacqueline [name], Jacqueline [name]                           MRN:          [id number]  DATE:12/08/2006                            DOB:          20-Feb-1951    PRIMARY CARE PHYSICIAN:  Dr. Laurita Quint.   REASON FOR PRESENTATION:  Patient palpitations.   HISTORY OF PRESENT ILLNESS:  The patient is a pleasant 60 year old who  presents for evaluation of palpitations.  She did wear a 2-week event  monitor.  She had occasional premature ventricular contractions and  premature atrial contractions which she did feel.  Mostly, these were at  rest.  She said overall she thinks she is having much less in the way of  palpitations.  She has not had any presyncope or syncope.  She is not  particularly bothered by these.  She has had no chest pressure or  shortness of breath.  She is cutting down on her cigarettes  significantly though she did not get her Chantix prescription yet.   PAST MEDICAL HISTORY:  Borderline dyslipidemia, borderline hypertension,  tobacco abuse, hysterectomy.   ALLERGIES:  None.   CURRENT MEDICATIONS:  1. Lipitor 10 q.h.s.  2. Multivitamin, calcium, fish oil.   REVIEW OF SYSTEMS:  As stated in the HPI and otherwise negative for any  other systems.   PHYSICAL EXAMINATION:  The patient is in no distress, her blood pressure  142/82, heart rate 76 and regular.  NECK:  No jugular venous distention at 45 degrees, no thyromegaly.  LUNGS:  Clear to auscultation bilaterally.  HEART:  PMI not displaced or sustained, S1 and S2 within normal limits,  no S3, S4, clicks, rubs, murmurs.  ABDOMEN:  Obese, positive bowel sounds normal in frequency, pitch, no  bruits, no rebound, no guarding, or midline pulsatile mass, no  organomegaly.  SKIN:  No rashes.  EXTREMITIES:  2+ pulse, no edema.   ASSESSMENT AND PLAN:  1. Palpitations.  We discussed the nature of these premature      ventricular  contractions.  They are not particularly bothering her.      At this point, she is going to let me know if she has any      increasing symptoms.  No further cardiovascular testing is planned.  2. Hypertension.  She has got borderline hypertension.  She is going      to keep an eye on this.  If in the future she is consistently      hypertensive I would start with therapeutic lifestyle changes.  She      needs to lose weight over time anyway.  She will pay attention to      this, though it may be difficult while she tries to stop smoking.  3. Tobacco.  This is her number one issue.  She is planning on      quitting altogether and I encouraged her to use the Chantix.  4. Followup will be as needed.     Rollene Rotunda, MD, [facility]  Electronically Signed    JH/MedQ  DD:  12/08/2006  DT: 12/08/2006  Job #: [id number]   cc:   Arta Silence, MD

## 2010-12-26 ENCOUNTER — Telehealth: Payer: Self-pay | Admitting: Family Medicine

## 2010-12-26 DIAGNOSIS — Z1231 Encounter for screening mammogram for malignant neoplasm of breast: Secondary | ICD-10-CM

## 2010-12-26 NOTE — Telephone Encounter (Signed)
Please place an order for a MMG at [facility].

## 2011-02-01 ENCOUNTER — Other Ambulatory Visit: Payer: Self-pay | Admitting: Family Medicine

## 2011-02-05 ENCOUNTER — Ambulatory Visit: Payer: Self-pay | Admitting: Family Medicine

## 2011-02-06 ENCOUNTER — Encounter: Payer: Self-pay | Admitting: Family Medicine

## 2011-02-07 ENCOUNTER — Encounter: Payer: Self-pay | Admitting: *Deleted

## 2011-03-08 ENCOUNTER — Ambulatory Visit (INDEPENDENT_AMBULATORY_CARE_PROVIDER_SITE_OTHER): Payer: Managed Care, Other (non HMO) | Admitting: Family Medicine

## 2011-03-08 ENCOUNTER — Encounter: Payer: Self-pay | Admitting: Family Medicine

## 2011-03-08 VITALS — BP 124/78 | HR 76 | Temp 98.0°F | Wt 192.8 lb

## 2011-03-08 DIAGNOSIS — M545 Low back pain, unspecified: Secondary | ICD-10-CM | POA: Insufficient documentation

## 2011-03-08 MED ORDER — NAPROXEN 500 MG PO TABS: ORAL_TABLET | ORAL | Status: DC

## 2011-03-08 NOTE — Patient Instructions (Signed)
I think this is sacroiliitis. Treat with naprosyn twice daily for 1 week then as needed (with food). We will set you up with physical therapy - pass by Marion's office to set this up. If not improving after treatment or any worsening, let us know for orthopedic referral.

## 2011-03-08 NOTE — Assessment & Plan Note (Signed)
Consistent with sacroiliitis, provided with stretching exercises from [facility] pt advisor. Will set up with PT, start naprosyn twice daily with food for 1 wk then as needed. If not improving with PT, pt will call us for referral to ortho for possible dx/tx SIJ injection. Pt agrees with plan.

## 2011-03-08 NOTE — Progress Notes (Signed)
  Subjective:    Patient ID: Jacqueline [name], female    DOB: 08-14-1950, 60 y.o.   MRN: [id number]  HPI CC: back pain  Describes about 4-5 mo h/o progressively worsening lower back pain described as dull achey constant pain, also feels popping in neck when walking.  Ibuprofen makes pain bearable.  Not positional really.  In mornings worse.  Has tempurpedic matress.  In here 2-3 mo ago for foot pain.  Back was bothering her then.  Told to walk and do exercises.  Foot got much better.  Back has not.    No radiculopathy, although does endorse mild left sided lateral shooting pain down to knee.  No fevers/chills, bowel/bladder incontinence.  Doesn't remember inciting event/trauma.  Has tried back stretching exercises including laying on back and bending hip/knee, sitting on wall, and raising legs when laying down, also walking.  Has tried ice which helped but then pain returns.  Taking ibuprofen 400mg  bid.  Review of Systems Per HPI    Objective:   Physical Exam  Nursing note and vitals reviewed. Constitutional: She appears well-developed and well-nourished. No distress.  HENT:  Head: Normocephalic and atraumatic.  Musculoskeletal: Normal range of motion. She exhibits no edema.       No midline thoracic, lumbar spinal pain.  + mild midline sacral pain.  No paraspinous mm tenderness. + pain worse at left SI joint. + FABER mildly on left. Neg SLR bilaterally. No pain with int/ext rotation at hips. No GTB pain or pain at sciatic notch.  Neurological: No sensory deficit.       Diminished DTRs througout  Skin: Skin is warm and dry. No rash noted.  Psychiatric: She has a normal mood and affect.    Mild L faber.      Assessment & Plan:

## 2011-03-13 ENCOUNTER — Encounter: Payer: Self-pay | Admitting: Family Medicine

## 2011-04-08 ENCOUNTER — Encounter: Payer: Self-pay | Admitting: Family Medicine

## 2011-08-28 ENCOUNTER — Ambulatory Visit (INDEPENDENT_AMBULATORY_CARE_PROVIDER_SITE_OTHER): Payer: Managed Care, Other (non HMO) | Admitting: Family Medicine

## 2011-08-28 ENCOUNTER — Encounter: Payer: Self-pay | Admitting: Family Medicine

## 2011-08-28 VITALS — BP 118/72 | HR 80 | Temp 98.2°F | Ht 66.0 in | Wt 188.8 lb

## 2011-08-28 DIAGNOSIS — A499 Bacterial infection, unspecified: Secondary | ICD-10-CM

## 2011-08-28 DIAGNOSIS — J329 Chronic sinusitis, unspecified: Secondary | ICD-10-CM

## 2011-08-28 DIAGNOSIS — B9689 Other specified bacterial agents as the cause of diseases classified elsewhere: Secondary | ICD-10-CM

## 2011-08-28 DIAGNOSIS — F17200 Nicotine dependence, unspecified, uncomplicated: Secondary | ICD-10-CM

## 2011-08-28 MED ORDER — AMOXICILLIN-POT CLAVULANATE 875-125 MG PO TABS: 1.0000 | ORAL_TABLET | Freq: Two times a day (BID) | ORAL | Status: AC

## 2011-08-28 NOTE — Progress Notes (Signed)
Subjective:    Patient ID: Jacqueline [name], female    DOB: July 25, 1950, 61 y.o.   MRN: [id number]  HPI Had a cold last week Got better and then got worse Now since Monday - a lot of facial pain - behind and under eyes  Worse when she bends forward Green nasal discharge  Was running fever last night - a little over 100   Still coughing a bit   Smoker too  She is working on cessation with Dr Dayton Martes Has cut down to 5 cig per day  Habit is the problem- does not crave nicotine  Needs to set a quit date   Husband smokes - this makes the process harder as he does not want to quit  Patient Active Problem List  Diagnoses  . HYPERCHOLESTEROLEMIA, PURE  . HYPERLIPIDEMIA  . OBESITY  . CIGARETTE SMOKER  . HYPERTENSION  . MITRAL VALVE PROLAPSE  . RENAL CALCULUS  . BREAST MASS  . SKIN TAG  . SKIN LESION  . OSTEOPENIA  . PALPITATIONS  . PAP SMEAR, ABNORMAL  . ELEVATED BLOOD PRESSURE WITHOUT DIAGNOSIS OF HYPERTENSION  . LUMBAR STRAIN, ACUTE  . Keratosis, seborrheic  . Plantar fascia syndrome  . Lower back pain  . Bacterial sinusitis   Past Medical History  Diagnosis Date  . Hyperlipidemia   . Osteoporosis     Osteopenia  . MVP (mitral valve prolapse)   . Calcium oxalate renal stones   . Abnormal Pap smear of vagina   . Tobacco abuse    Past Surgical History  Procedure Date  . Vaginal hysterectomy     class IV, ovaries intact  . Tonsillectomy and adenoidectomy   . Extraction of renal stones    History  Substance Use Topics  . Smoking status: Current Everyday Smoker -- 0.5 packs/day    Types: Cigarettes  . Smokeless tobacco: Not on file  . Alcohol Use: Yes   Family History  Problem Relation Age of Onset  . Hypertension Mother   . Stroke Father 64  . Cancer Maternal Aunt     breast and lung  . Heart disease Maternal Grandmother   . Heart disease Maternal Grandfather   . Hypertension Paternal Grandmother    No Known Allergies Current Outpatient Prescriptions on  File Prior to Visit  Medication Sig Dispense Refill  . Calcium Carbonate (CALTRATE 600 PO) Take 2 tablets by mouth daily.        . fish oil-omega-3 fatty acids 1000 MG capsule Take 1 g by mouth daily.        Marland Kitchen ibuprofen (ADVIL,MOTRIN) 200 MG tablet Take 800 mg by mouth 2 (two) times daily.        Marland Kitchen LIPITOR 10 MG tablet TAKE 1 TABLET (10 MG) BY MOUTH DAILY  90 tablet  3  . lisinopril (PRINIVIL,ZESTRIL) 10 MG tablet Take 1 tablet (10 mg total) by mouth daily.  90 tablet  0  . Multiple Vitamin (MULTIVITAMIN) tablet Take 1 tablet by mouth daily.        . naproxen (NAPROSYN) 500 MG tablet Take one po bid x 1 week then prn pain, take with food  60 tablet  0     Review of Systems Review of Systems  Constitutional: Negative for  unexpected weight change. pos for malaise and fever Eyes: Negative for pain and visual disturbance neg for red or swollen eyes ENT neg for st or ear pain .  Respiratory: Negative for sob/ pos for cough / neg for  wheeze   Cardiovascular: Negative for cp or palpitations    Gastrointestinal: Negative for nausea, diarrhea and constipation.  Genitourinary: Negative for urgency and frequency.  Skin: Negative for pallor or rash   Neurological: Negative for weakness, light-headedness, numbness and headaches.  Hematological: Negative for adenopathy. Does not bruise/bleed easily.  Psychiatric/Behavioral: Negative for dysphoric mood. The patient is not nervous/anxious.          Objective:   Physical Exam  Constitutional: She appears well-developed and well-nourished. No distress.  HENT:  Head: Normocephalic and atraumatic.  Right Ear: External ear normal.  Left Ear: External ear normal.  Mouth/Throat: Oropharynx is clear and moist. No oropharyngeal exudate.       Nares are injected and congested  Pos sinus TTP Throat- post nasal drip yellow mucous  Eyes: Conjunctivae and EOM are normal. Pupils are equal, round, and reactive to light. Right eye exhibits no discharge. Left  eye exhibits no discharge.  Neck: Normal range of motion. Neck supple. No JVD present. No thyromegaly present.  Cardiovascular: Normal rate, regular rhythm and normal heart sounds.   Pulmonary/Chest: Effort normal and breath sounds normal. No respiratory distress. She has no wheezes.       Diffusely distant bs Harsh bs throughout   Lymphadenopathy:    She has no cervical adenopathy.  Neurological: She is alert. She has normal reflexes. She exhibits normal muscle tone. Coordination normal.  Skin: Skin is warm and dry. No rash noted. No erythema.  Psychiatric: She has a normal mood and affect.          Assessment & Plan:

## 2011-08-28 NOTE — Assessment & Plan Note (Signed)
Disc in detail risks of smoking and possible outcomes including copd, vascular/ heart disease, cancer , respiratory and sinus infections  Pt voices understanding  She plans to set a quit date Cut down to 5 per day

## 2011-08-28 NOTE — Assessment & Plan Note (Signed)
In smoker s/p uri with sinus pain /fever/ purulent drainage tx with augmentin Disc symptomatic care - see instructions on AVS  Also handout given Update if not starting to improve in a week or if worsening  Will try to quit smoking

## 2011-08-28 NOTE — Patient Instructions (Signed)
Drink lots of fluids Use nasal saline spray for congestion Set a smoking quit date  Take the augmentin as directed mucinex is helpful to loosen mucous also   Sinusitis Sinuses are air pockets within the bones of your face. The growth of bacteria within a sinus leads to infection. The infection prevents the sinuses from draining. This infection is called sinusitis. SYMPTOMS   There will be different areas of pain depending on which sinuses have become infected.  The maxillary sinuses often produce pain beneath the eyes.     Frontal sinusitis may cause pain in the middle of the forehead and above the eyes.  Other problems (symptoms) include:  Toothaches.     Colored, pus-like (purulent) drainage from the nose.     Swelling, warmth, and tenderness over the sinus areas may be signs of infection.  TREATMENT   Sinusitis is most often determined by an exam.X-rays may be taken. If x-rays have been taken, make sure you obtain your results or find out how you are to obtain them. Your caregiver may give you medications (antibiotics). These are medications that will help kill the bacteria causing the infection. You may also be given a medication (decongestant) that helps to reduce sinus swelling.   HOME CARE INSTRUCTIONS    Only take over-the-counter or prescription medicines for pain, discomfort, or fever as directed by your caregiver.     Drink extra fluids. Fluids help thin the mucus so your sinuses can drain more easily.     Applying either moist heat or ice packs to the sinus areas may help relieve discomfort.     Use saline nasal sprays to help moisten your sinuses. The sprays can be found at your local drugstore.  SEEK IMMEDIATE MEDICAL CARE IF:  You have a fever.     You have increasing pain, severe headaches, or toothache.     You have nausea, vomiting, or drowsiness.     You develop unusual swelling around the face or trouble seeing.  MAKE SURE YOU:    Understand these  instructions.     Will watch your condition.     Will get help right away if you are not doing well or get worse.  Document Released: 06/24/2005 Document Revised: 03/06/2011 Document Reviewed: 01/21/2007 [facility] Patient Information 2012 [place], [place].Sinusitis Sinuses are air pockets within the bones of your face. The growth of bacteria within a sinus leads to infection. The infection prevents the sinuses from draining. This infection is called sinusitis. SYMPTOMS   There will be different areas of pain depending on which sinuses have become infected.  The maxillary sinuses often produce pain beneath the eyes.     Frontal sinusitis may cause pain in the middle of the forehead and above the eyes.  Other problems (symptoms) include:  Toothaches.     Colored, pus-like (purulent) drainage from the nose.     Swelling, warmth, and tenderness over the sinus areas may be signs of infection.  TREATMENT   Sinusitis is most often determined by an exam.X-rays may be taken. If x-rays have been taken, make sure you obtain your results or find out how you are to obtain them. Your caregiver may give you medications (antibiotics). These are medications that will help kill the bacteria causing the infection. You may also be given a medication (decongestant) that helps to reduce sinus swelling.   HOME CARE INSTRUCTIONS    Only take over-the-counter or prescription medicines for pain, discomfort, or fever as directed by your  caregiver.     Drink extra fluids. Fluids help thin the mucus so your sinuses can drain more easily.     Applying either moist heat or ice packs to the sinus areas may help relieve discomfort.     Use saline nasal sprays to help moisten your sinuses. The sprays can be found at your local drugstore.  SEEK IMMEDIATE MEDICAL CARE IF:  You have a fever.     You have increasing pain, severe headaches, or toothache.     You have nausea, vomiting, or drowsiness.     You develop  unusual swelling around the face or trouble seeing.  MAKE SURE YOU:    Understand these instructions.     Will watch your condition.     Will get help right away if you are not doing well or get worse.  Document Released: 06/24/2005 Document Revised: 03/06/2011 Document Reviewed: 01/21/2007 [facility] Patient Information 2012 [place], [place].

## 2011-09-20 ENCOUNTER — Ambulatory Visit (INDEPENDENT_AMBULATORY_CARE_PROVIDER_SITE_OTHER): Payer: Managed Care, Other (non HMO) | Admitting: Family Medicine

## 2011-09-20 ENCOUNTER — Encounter: Payer: Self-pay | Admitting: Family Medicine

## 2011-09-20 VITALS — BP 120/78 | HR 73 | Temp 98.1°F | Ht 66.0 in | Wt 190.4 lb

## 2011-09-20 DIAGNOSIS — J40 Bronchitis, not specified as acute or chronic: Secondary | ICD-10-CM

## 2011-09-20 MED ORDER — HYDROCOD POLST-CHLORPHEN POLST 10-8 MG/5ML PO LQCR: 5.0000 mL | Freq: Every evening | ORAL | Status: DC | PRN

## 2011-09-20 MED ORDER — AZITHROMYCIN 250 MG PO TABS: ORAL_TABLET | ORAL | Status: AC

## 2011-09-20 NOTE — Progress Notes (Signed)
SUBJECTIVE:  Jacqueline [name] is a 61 y.o. female who complains of productive cough, myalgias and headache for 3 weeks. She denies a history of anorexia, chest pain, fatigue, shortness of breath, sweats, vomiting and weakness and denies a history of asthma. Patient admits to smoke cigarettes but has not felt well enough to smoke last several weeks.   Saw Dr. Milinda Antis last month, treated for sinusitis with Augmentin.  Sinus symptoms resolved but cough seems to have progressed since then. No CP, SOB only while coughing.   Patient Active Problem List  Diagnoses  . HYPERCHOLESTEROLEMIA, PURE  . HYPERLIPIDEMIA  . OBESITY  . CIGARETTE SMOKER  . HYPERTENSION  . MITRAL VALVE PROLAPSE  . RENAL CALCULUS  . BREAST MASS  . SKIN TAG  . SKIN LESION  . OSTEOPENIA  . PALPITATIONS  . PAP SMEAR, ABNORMAL  . ELEVATED BLOOD PRESSURE WITHOUT DIAGNOSIS OF HYPERTENSION  . LUMBAR STRAIN, ACUTE  . Keratosis, seborrheic  . Plantar fascia syndrome  . Lower back pain  . Bacterial sinusitis   Past Medical History  Diagnosis Date  . Hyperlipidemia   . Osteoporosis     Osteopenia  . MVP (mitral valve prolapse)   . Calcium oxalate renal stones   . Abnormal Pap smear of vagina   . Tobacco abuse    Past Surgical History  Procedure Date  . Vaginal hysterectomy     class IV, ovaries intact  . Tonsillectomy and adenoidectomy   . Extraction of renal stones    History  Substance Use Topics  . Smoking status: Current Everyday Smoker -- 0.5 packs/day    Types: Cigarettes  . Smokeless tobacco: Not on file  . Alcohol Use: Yes   Family History  Problem Relation Age of Onset  . Hypertension Mother   . Stroke Father 68  . Cancer Maternal Aunt     breast and lung  . Heart disease Maternal Grandmother   . Heart disease Maternal Grandfather   . Hypertension Paternal Grandmother    No Known Allergies Current Outpatient Prescriptions on File Prior to Visit  Medication Sig Dispense Refill  . Calcium  Carbonate (CALTRATE 600 PO) Take 2 tablets by mouth daily.        . fish oil-omega-3 fatty acids 1000 MG capsule Take 1 g by mouth daily.        Marland Kitchen ibuprofen (ADVIL,MOTRIN) 200 MG tablet Take 800 mg by mouth 2 (two) times daily.        Marland Kitchen LIPITOR 10 MG tablet TAKE 1 TABLET (10 MG) BY MOUTH DAILY  90 tablet  3  . lisinopril (PRINIVIL,ZESTRIL) 10 MG tablet Take 1 tablet (10 mg total) by mouth daily.  90 tablet  0  . Multiple Vitamin (MULTIVITAMIN) tablet Take 1 tablet by mouth daily.        . naproxen (NAPROSYN) 500 MG tablet Take one po bid x 1 week then prn pain, take with food  60 tablet  0   The PMH, PSH, Social History, Family History, Medications, and allergies have been reviewed in [facility], and have been updated if relevant.  OBJECTIVE: BP 120/78  Pulse 73  Temp(Src) 98.1 F (36.7 C) (Oral)  Ht 5\' 6"  (1.676 m)  Wt 190 lb 6.4 oz (86.365 kg)  BMI 30.73 kg/m2  SpO2 97%  She appears well, vital signs are as noted. Ears normal.  Throat and pharynx normal.  Neck supple. No adenopathy in the neck. Nose is congested. Sinuses non tender. Pos wheezes bilaterally, right >  left  ASSESSMENT:  bronchitis  PLAN: Zpack as directed.  Pt declined inhaler. Symptomatic therapy suggested: push fluids, rest and return office visit prn if symptoms persist or worsen.  Call or return to clinic prn if these symptoms worsen or fail to improve as anticipated.

## 2011-09-20 NOTE — Patient Instructions (Signed)
Take Zpack as directed.  Drink lots of fluids.  Treat sympotmatically with Mucinex, nasal saline irrigation, and Tylenol/Ibuprofen.  Cough suppressant at night. Call if not improving as expected in 5-7 days.    

## 2011-12-13 ENCOUNTER — Other Ambulatory Visit: Payer: Self-pay | Admitting: Family Medicine

## 2012-04-29 ENCOUNTER — Other Ambulatory Visit: Payer: Self-pay | Admitting: Family Medicine

## 2012-04-29 NOTE — Telephone Encounter (Signed)
Refill request for lipitor 10 mg. Last OV was over 6 mos no current labs, ok to refill?

## 2012-06-17 ENCOUNTER — Other Ambulatory Visit: Payer: Self-pay | Admitting: Family Medicine

## 2012-06-17 DIAGNOSIS — E7800 Pure hypercholesterolemia, unspecified: Secondary | ICD-10-CM

## 2012-06-17 DIAGNOSIS — I10 Essential (primary) hypertension: Secondary | ICD-10-CM

## 2012-06-18 ENCOUNTER — Other Ambulatory Visit (INDEPENDENT_AMBULATORY_CARE_PROVIDER_SITE_OTHER): Payer: Managed Care, Other (non HMO)

## 2012-06-18 DIAGNOSIS — E7800 Pure hypercholesterolemia, unspecified: Secondary | ICD-10-CM

## 2012-06-18 DIAGNOSIS — I10 Essential (primary) hypertension: Secondary | ICD-10-CM

## 2012-06-18 LAB — LIPID PANEL
Cholesterol: 147 mg/dL (ref 0–200)
LDL Cholesterol: 84 mg/dL (ref 0–99)
Total CHOL/HDL Ratio: 3
VLDL: 15.8 mg/dL (ref 0.0–40.0)

## 2012-06-18 LAB — COMPREHENSIVE METABOLIC PANEL
AST: 18 U/L (ref 0–37)
Alkaline Phosphatase: 62 U/L (ref 39–117)
Glucose, Bld: 94 mg/dL (ref 70–99)
Sodium: 137 mEq/L (ref 135–145)
Total Bilirubin: 0.8 mg/dL (ref 0.3–1.2)
Total Protein: 7.5 g/dL (ref 6.0–8.3)

## 2012-06-25 ENCOUNTER — Ambulatory Visit (INDEPENDENT_AMBULATORY_CARE_PROVIDER_SITE_OTHER): Payer: Managed Care, Other (non HMO) | Admitting: Family Medicine

## 2012-06-25 ENCOUNTER — Encounter: Payer: Self-pay | Admitting: Family Medicine

## 2012-06-25 ENCOUNTER — Encounter: Payer: Self-pay | Admitting: Internal Medicine

## 2012-06-25 ENCOUNTER — Other Ambulatory Visit: Payer: Self-pay

## 2012-06-25 VITALS — BP 136/88 | HR 76 | Temp 98.3°F | Ht 64.75 in | Wt 184.0 lb

## 2012-06-25 DIAGNOSIS — Z0000 Encounter for general adult medical examination without abnormal findings: Secondary | ICD-10-CM

## 2012-06-25 DIAGNOSIS — I10 Essential (primary) hypertension: Secondary | ICD-10-CM

## 2012-06-25 DIAGNOSIS — Z1211 Encounter for screening for malignant neoplasm of colon: Secondary | ICD-10-CM

## 2012-06-25 DIAGNOSIS — M949 Disorder of cartilage, unspecified: Secondary | ICD-10-CM

## 2012-06-25 DIAGNOSIS — E785 Hyperlipidemia, unspecified: Secondary | ICD-10-CM

## 2012-06-25 DIAGNOSIS — Z1231 Encounter for screening mammogram for malignant neoplasm of breast: Secondary | ICD-10-CM

## 2012-06-25 DIAGNOSIS — D239 Other benign neoplasm of skin, unspecified: Secondary | ICD-10-CM

## 2012-06-25 DIAGNOSIS — D229 Melanocytic nevi, unspecified: Secondary | ICD-10-CM

## 2012-06-25 DIAGNOSIS — M899 Disorder of bone, unspecified: Secondary | ICD-10-CM

## 2012-06-25 MED ORDER — MELOXICAM 15 MG PO TABS: 15.0000 mg | ORAL_TABLET | Freq: Every day | ORAL | Status: DC

## 2012-06-25 NOTE — Patient Instructions (Addendum)
Check with your insurance to see if they will cover the shingles shot. Please stop by to see Shirlee Limerick on your way out to step up your colonoscopy and dermatology referral. Please call [place] to set up your mammogram.

## 2012-06-25 NOTE — Progress Notes (Signed)
Subjective:    Patient ID: Jacqueline [name], female    DOB: 23-Feb-1951, 61 y.o.   MRN: [id number]  HPI  61 yo here for CPX.  HDL- well controlled on Lipitor 10 mg daily.  Lab Results  Component Value Date   CHOL 147 06/18/2012   HDL 46.80 06/18/2012   LDLCALC 84 06/18/2012   LDLDIRECT 149.5 12/25/2006   TRIG 79.0 06/18/2012   CHOLHDL 3 06/18/2012   Lab Results  Component Value Date   ALT 21 06/18/2012   AST 18 06/18/2012   ALKPHOS 62 06/18/2012   BILITOT 0.8 06/18/2012    No myalgias or other side effects.  Working on diet as well.   Osteopenia- dexa in 08/2008- osteopenia.  Taking Catriate 1200 mg daily.   Well woman- due for tetanus, colonoscopy, mammogram.   Had a total hysterectomy due to heavy bleeding in 1976.  Mole on right temple- getting larger over past year.  No bleeding or irritation.  Patient Active Problem List  Diagnosis  . HYPERCHOLESTEROLEMIA, PURE  . HYPERLIPIDEMIA  . OBESITY  . CIGARETTE SMOKER  . HYPERTENSION  . MITRAL VALVE PROLAPSE  . RENAL CALCULUS  . BREAST MASS  . SKIN TAG  . SKIN LESION  . OSTEOPENIA  . PALPITATIONS  . PAP SMEAR, ABNORMAL  . LUMBAR STRAIN, ACUTE  . Keratosis, seborrheic  . Plantar fascia syndrome  . Routine general medical examination at a health care facility   Past Medical History  Diagnosis Date  . Hyperlipidemia   . Osteoporosis     Osteopenia  . MVP (mitral valve prolapse)   . Calcium oxalate renal stones   . Abnormal Pap smear of vagina   . Tobacco abuse    Past Surgical History  Procedure Date  . Vaginal hysterectomy     class IV, ovaries intact  . Tonsillectomy and adenoidectomy   . Extraction of renal stones    History  Substance Use Topics  . Smoking status: Current Every Day Smoker -- 0.5 packs/day    Types: Cigarettes  . Smokeless tobacco: Not on file  . Alcohol Use: Yes   Family History  Problem Relation Age of Onset  . Hypertension Mother   . Stroke Father 46  . Cancer Maternal  Aunt     breast and lung  . Heart disease Maternal Grandmother   . Heart disease Maternal Grandfather   . Hypertension Paternal Grandmother    No Known Allergies Current Outpatient Prescriptions on File Prior to Visit  Medication Sig Dispense Refill  . atorvastatin (LIPITOR) 10 MG tablet TAKE 1 TABLET (10MG ) BY MOUTH DAILY  90 tablet  3  . Calcium Carbonate (CALTRATE 600 PO) Take 2 tablets by mouth daily.        . chlorpheniramine-HYDROcodone (TUSSIONEX PENNKINETIC ER) 10-8 MG/5ML LQCR Take 5 mLs by mouth at bedtime as needed.  140 mL  0  . fish oil-omega-3 fatty acids 1000 MG capsule Take 1 g by mouth daily.        Marland Kitchen lisinopril (PRINIVIL,ZESTRIL) 10 MG tablet TAKE 1 TABLET (10MG  TOTAL) BY MOUTH DAILY  90 tablet  1  . Multiple Vitamin (MULTIVITAMIN) tablet Take 1 tablet by mouth daily.         The PMH, PSH, Social History, Family History, Medications, and allergies have been reviewed in [facility], and have been updated if relevant.    Review of Systems See HPI Patient reports no  vision/ hearing changes,anorexia, weight change, fever ,adenopathy, persistant / recurrent hoarseness, swallowing  issues, chest pain, edema,persistant / recurrent cough, hemoptysis, dyspnea(rest, exertional, paroxysmal nocturnal), gastrointestinal  bleeding (melena, rectal bleeding), abdominal pain, excessive heart burn, GU symptoms(dysuria, hematuria, pyuria, voiding/incontinence  Issues) syncope, focal weakness, severe memory loss, concerning skin lesions, depression, anxiety, abnormal bruising/bleeding, major joint swelling, breast masses or abnormal vaginal bleeding.       Objective:   Physical Exam BP 136/88  Pulse 76  Temp 98.3 F (36.8 C)  Ht 5' 4.75" (1.645 m)  Wt 184 lb (83.462 kg)  BMI 30.86 kg/m2   General:  Well-developed,well-nourished,in no acute distress; alert,appropriate and cooperative throughout examination Head:  normocephalic and atraumatic.   Raised brown circular lesion right temple   (?Sk) Eyes:  vision grossly intact, pupils equal, pupils round, and pupils reactive to light.   Ears:  R ear normal and L ear normal.   Nose:  no external deformity.   Mouth:  good dentition.   Neck:  No deformities, masses, or tenderness noted. Breasts:  No mass, nodules, thickening, tenderness, bulging, retraction, inflamation, nipple discharge or skin changes noted.   Lungs:  Normal respiratory effort, chest expands symmetrically. Lungs are clear to auscultation, no crackles or wheezes. Heart:  Normal rate and regular rhythm. S1 and S2 normal without gallop, murmur, click, rub or other extra sounds. Abdomen:  Bowel sounds positive,abdomen soft and non-tender without masses, organomegaly or hernias noted. Msk:  No deformity or scoliosis noted of thoracic or lumbar spine.   Extremities:  No clubbing, cyanosis, edema, or deformity noted with normal full range of motion of all joints.   Neurologic:  alert & oriented X3 and gait normal.   Skin:  Intact without suspicious lesions or rashes SKs on back Cervical Nodes:  No lymphadenopathy noted Axillary Nodes:  No palpable lymphadenopathy Psych:  Cognition and judgment appear intact. Alert and cooperative with normal attention span and concentration. No apparent delusions, illusions, hallucinations       Assessment & Plan:   1. HYPERTENSION  Stable.   2. HYPERLIPIDEMIA  Well controlled on lipitor 10 mg daily.   3. OSTEOPENIA  Repeat DEXA.   4. Routine general medical examination at a health care facility  Reviewed preventive care protocols, scheduled due services, and updated immunizations Discussed nutrition, exercise, diet, and healthy lifestyle.    5. Other screening mammogram  MM Digital Screening  6. Screening for colon cancer  Ambulatory referral to Gastroenterology  7. Atypical nevus  Probably benign but since it has changed, will refer to derm. The patient indicates understanding of these issues and agrees with the plan.   Ambulatory referral to Dermatology

## 2012-06-25 NOTE — Telephone Encounter (Signed)
[facility].

## 2012-06-25 NOTE — Telephone Encounter (Signed)
Pt was seen today and [facility] does not have Meloxicam prescription; Meloxicam was [facility]. I sent Meloxicam 15 mg as instructed to [facility]. Please call [facility] to cancel Meloxicam.

## 2012-07-15 ENCOUNTER — Ambulatory Visit: Payer: Self-pay | Admitting: Family Medicine

## 2012-07-16 ENCOUNTER — Encounter: Payer: Self-pay | Admitting: Family Medicine

## 2012-07-22 ENCOUNTER — Ambulatory Visit (AMBULATORY_SURGERY_CENTER): Payer: Managed Care, Other (non HMO)

## 2012-07-22 VITALS — Ht 66.0 in | Wt 186.2 lb

## 2012-07-22 DIAGNOSIS — Z1211 Encounter for screening for malignant neoplasm of colon: Secondary | ICD-10-CM

## 2012-07-22 MED ORDER — MOVIPREP 100 G PO SOLR: ORAL | Status: DC

## 2012-07-23 ENCOUNTER — Encounter: Payer: Self-pay | Admitting: Family Medicine

## 2012-07-23 ENCOUNTER — Ambulatory Visit: Payer: Self-pay | Admitting: Family Medicine

## 2012-07-23 ENCOUNTER — Other Ambulatory Visit: Payer: Self-pay | Admitting: Family Medicine

## 2012-07-23 ENCOUNTER — Encounter: Payer: Self-pay | Admitting: Internal Medicine

## 2012-07-23 DIAGNOSIS — N63 Unspecified lump in unspecified breast: Secondary | ICD-10-CM

## 2012-08-14 ENCOUNTER — Encounter: Payer: Self-pay | Admitting: Internal Medicine

## 2012-08-14 ENCOUNTER — Ambulatory Visit (AMBULATORY_SURGERY_CENTER): Payer: Managed Care, Other (non HMO) | Admitting: Internal Medicine

## 2012-08-14 VITALS — BP 139/53 | HR 76 | Temp 98.2°F | Resp 10 | Ht 66.0 in | Wt 188.0 lb

## 2012-08-14 DIAGNOSIS — Z1211 Encounter for screening for malignant neoplasm of colon: Secondary | ICD-10-CM

## 2012-08-14 MED ORDER — SODIUM CHLORIDE 0.9 % IV SOLN: 500.0000 mL | INTRAVENOUS | Status: DC

## 2012-08-14 NOTE — Patient Instructions (Addendum)

## 2012-08-14 NOTE — Op Note (Signed)
[facility] [address] [place], [address]   COLONOSCOPY PROCEDURE REPORT  PATIENT: Jacqueline [name], Jacqueline [name]  MR#: [id number] BIRTHDATE: 02-26-1951 , 62  yrs. old GENDER: Female ENDOSCOPIST: Hart Carwin, MD REFERRED BY:  Ruthe Mannan, M.D. PROCEDURE DATE:  08/14/2012 PROCEDURE:   Colonoscopy, screening ASA CLASS:   Class I INDICATIONS:Average risk patient for colon cancer. MEDICATIONS: MAC sedation, administered by CRNA and propofol (Diprivan) 250mg  IV  DESCRIPTION OF PROCEDURE:   After the risks and benefits and of the procedure were explained, informed consent was obtained.  A digital rectal exam revealed no abnormalities of the rectum.    The LB [id number]-[id number]  endoscope was introduced through the anus and advanced to the cecum, which was identified by both the appendix and ileocecal valve .  The quality of the prep was good, using MoviPrep .  The instrument was then slowly withdrawn as the colon was fully examined.     COLON FINDINGS: Mild diverticulosis was noted.     Retroflexed views revealed no abnormalities.     The scope was then withdrawn from the patient and the procedure completed.  COMPLICATIONS: There were no complications. ENDOSCOPIC IMPRESSION: Mild diverticulosis was noted  RECOMMENDATIONS: High fiber diet   REPEAT EXAM: In 10 year(s)  for Colonoscopy.  cc:  _______________________________ eSignedHart Carwin, MD 08/14/2012 10:54 AM

## 2012-08-14 NOTE — Progress Notes (Signed)
Patient did not experience any of the following events: a burn prior to discharge; a fall within the facility; wrong site/side/patient/procedure/implant event; or a hospital transfer or hospital admission upon discharge from the facility. (G8907) Patient did not have preoperative order for IV antibiotic SSI prophylaxis. (G8918)  

## 2012-08-14 NOTE — Progress Notes (Signed)
Pt stable to RR 

## 2012-08-17 ENCOUNTER — Telehealth: Payer: Self-pay | Admitting: *Deleted

## 2012-08-17 NOTE — Telephone Encounter (Signed)
  Follow up Call-  Call back number 08/14/2012  Post procedure Call Back phone  # [phone]  Permission to leave phone message Yes     Patient questions:  Do you have a fever, pain , or abdominal swelling? no Pain Score  0 *  Have you tolerated food without any problems? yes  Have you been able to return to your normal activities? yes  Do you have any questions about your discharge instructions: Diet   no Medications  no Follow up visit  no  Do you have questions or concerns about your Care? no  Actions: * If pain score is 4 or above: No action needed, pain <4.

## 2012-08-19 HISTORY — PX: BREAST BIOPSY: SHX20

## 2012-09-08 ENCOUNTER — Telehealth: Payer: Self-pay | Admitting: Family Medicine

## 2012-09-08 ENCOUNTER — Ambulatory Visit (INDEPENDENT_AMBULATORY_CARE_PROVIDER_SITE_OTHER): Payer: Managed Care, Other (non HMO) | Admitting: Family Medicine

## 2012-09-08 ENCOUNTER — Encounter: Payer: Self-pay | Admitting: Family Medicine

## 2012-09-08 VITALS — BP 130/76 | HR 96 | Temp 98.8°F | Wt 188.0 lb

## 2012-09-08 DIAGNOSIS — J019 Acute sinusitis, unspecified: Secondary | ICD-10-CM | POA: Insufficient documentation

## 2012-09-08 MED ORDER — AMOXICILLIN-POT CLAVULANATE 875-125 MG PO TABS: 1.0000 | ORAL_TABLET | Freq: Two times a day (BID) | ORAL | Status: DC

## 2012-09-08 NOTE — Telephone Encounter (Signed)
Patient Information:  Caller Name: Soley  Phone: [phone]  Patient: Jacqueline [name], Jacqueline [name]  Gender: Female  DOB: 06-26-1951  Age: 62 Years  PCP: Ruthe Mannan [facility])  Office Follow Up:  Does the office need to follow up with this patient?: No  Instructions For The Office: N/A  RN Note:  Right sided sinus pain and congestion with low grade fever (100 po 09/07/12) during past two evenings.  Mild sinus pain at present but painful when "mashes" on cheekbone or forehead."  Symptoms  Reason For Call & Symptoms: Sinus congestion with pain in right cheekbone and forehead; thinks has a sinus infection.  Reviewed Health History In EMR: Yes  Reviewed Medications In EMR: Yes  Reviewed Allergies In EMR: Yes  Reviewed Surgeries / Procedures: Yes  Date of Onset of Symptoms: 09/05/2012  Treatments Tried: Mucinex, Ibuprofen, nasal saline  Treatments Tried Worked: Yes  Guideline(s) Used:  Sinus Pain and Congestion  Disposition Per Guideline:   See Today or Tomorrow in Office  Reason For Disposition Reached:   Using nasal washes and pain medicine > 24 hours and sinus pain (lower forehead, cheekbone, or eye) persists  Advice Given:  Reassurance:   Sinus congestion is a normal part of a cold.  Usually home treatment with nasal washes can prevent an actual bacterial sinus infection.  Antibiotics are not helpful for the sinus congestion that occurs with colds.  Here is some care advice that should help.  For a Runny Nose With Profuse Discharge:  Nasal mucus and discharge helps to wash viruses and bacteria out of the nose and sinuses.  Blowing the nose is all that is needed.  For a Stuffy Nose - Use Nasal Washes:  Introduction: Saline (salt water) nasal irrigation (nasal wash) is an effective and simple home remedy for treating stuffy nose and sinus congestion. The nose can be irrigated by pouring, spraying, or squirting salt water into the nose and then letting it run back out.  Medicines  for a Stuffy or Runny Nose:  Most cold medicines that are available over-the-counter (OTC) are not helpful.  Antihistamines: Are only helpful if you also have nasal allergies.  If you have a very runny nose and you really think you need a medicine, you can try using a nasal decongestant for a couple days.  Pain and Fever Medicines:  For pain or fever relief, take either acetaminophen or ibuprofen.  Treat fevers above 101 F (38.3 C). The goal of fever therapy is to bring the fever down to a comfortable level. Remember that fever medicine usually lowers fever 2 degrees F (1 - 1 1/2 degrees C).  Hydration:  Drink plenty of liquids (6-8 glasses of water daily). If the air in your home is dry, use a cool mist humidifier  Expected Course:  Sinus congestion from viral upper respiratory infections (colds) usually lasts 5-10 days.  Occasionally a cold can worsen and turn into bacterial sinusitis. Clues to this are sinus symptoms lasting longer than 10 days, fever lasting longer than 3 days, and worsening pain. Bacterial sinusitis may need antibiotic treatment.  Call Back If:   Severe pain lasts longer than 2 hours after pain medicine  Sinus congestion (fullness) lasts longer than 10 days  Fever lasts longer than 3 days  You become worse.  Appointment Scheduled:  09/08/2012 16:00:00 Appointment Scheduled Provider:  Crawford Givens Clelia Croft) [facility])

## 2012-09-08 NOTE — Assessment & Plan Note (Signed)
Nontoxic.  Supportive care, start augmentin.  F/u prn.  D/w pt.

## 2012-09-08 NOTE — Patient Instructions (Signed)
Start the augmentin and use nasal saline spray.  Drink plenty of fluids, take tylenol as needed, and this should gradually improve.  Take care.  Let us know if you have other concerns.

## 2012-09-08 NOTE — Progress Notes (Signed)
She's trying quit smoking.    duration of symptoms: about 1 week, worse over the last few days.  Since ~Sunday with more facial pain.  Had a fever last 2 nights.  HA, R frontal and maxillary pain.   Rhinorrhea: yes congestion:yes ear pain:no  sore throat: no Cough: minimal other concerns: taking mucinex with some help for sleep.  Has had chills at night.   ROS: See HPI.  Otherwise negative.    Meds, vitals, and allergies reviewed.   GEN: nad, alert and oriented HEENT: mucous membranes moist, TM w/o erythema, nasal epithelium injected, OP with cobblestoning, R frontal and max sinus ttp NECK: supple w/o LA CV: rrr. PULM: ctab, no inc wob EXT: no edema

## 2012-09-14 ENCOUNTER — Ambulatory Visit: Payer: Self-pay | Admitting: General Surgery

## 2012-09-29 ENCOUNTER — Other Ambulatory Visit: Payer: Self-pay | Admitting: Family Medicine

## 2012-09-29 NOTE — Telephone Encounter (Signed)
Script sent to pharmacy.

## 2013-02-15 ENCOUNTER — Encounter: Payer: Self-pay | Admitting: Family Medicine

## 2013-02-15 ENCOUNTER — Ambulatory Visit (INDEPENDENT_AMBULATORY_CARE_PROVIDER_SITE_OTHER): Payer: 59 | Admitting: Family Medicine

## 2013-02-15 VITALS — BP 114/80 | HR 100 | Temp 99.0°F | Wt 187.8 lb

## 2013-02-15 DIAGNOSIS — J029 Acute pharyngitis, unspecified: Secondary | ICD-10-CM

## 2013-02-15 DIAGNOSIS — J069 Acute upper respiratory infection, unspecified: Secondary | ICD-10-CM | POA: Insufficient documentation

## 2013-02-15 MED ORDER — LIDOCAINE VISCOUS 2 % MT SOLN: 10.0000 mL | OROMUCOSAL | Status: DC | PRN

## 2013-02-15 MED ORDER — BENZONATATE 200 MG PO CAPS: 200.0000 mg | ORAL_CAPSULE | Freq: Two times a day (BID) | ORAL | Status: DC | PRN

## 2013-02-15 NOTE — Progress Notes (Signed)
3 days of sx, worse yesterday, dry cough.  No ear pain or sinus pressure.  ST, worse with swallowing.  Hoarse. Fever noted recently, sweats and chills. Some HA. Took ibuprofen for fever, some better overall with ibuprofen.  Likely sick contacts.  HA likely from cough per patient.   Meds, vitals, and allergies reviewed.   ROS: See HPI.  Otherwise, noncontributory.  GEN: nad, alert and oriented HEENT: mucous membranes moist, tm w/o erythema, nasal exam w/o erythema, clear discharge noted,  OP with cobblestoning, sinuses not ttp NECK: supple w/o LA CV: rrr.   PULM: ctab, no inc wob EXT: no edema SKIN: no acute rash  RST neg

## 2013-02-15 NOTE — Patient Instructions (Addendum)
Use the tessalon for the cough and the lidocaine for your sore throat. Drink plenty of fluids, take tylenol as needed, and gargle with warm salt water for your throat.  This should gradually improve.  Take care.  Let us know if you have other concerns.

## 2013-02-15 NOTE — Assessment & Plan Note (Signed)
Nontoxic, likely viral, supportive tx and fu prn.  Tessalon and lidocaine in meantime. RST neg.  D/w pt.  She agrees.

## 2013-02-17 ENCOUNTER — Encounter: Payer: Self-pay | Admitting: Family Medicine

## 2013-02-22 ENCOUNTER — Telehealth: Payer: Self-pay | Admitting: *Deleted

## 2013-02-22 NOTE — Telephone Encounter (Signed)
Patient called the office wanting to know if we would be scheduling next mammogram. She received a letter from the hospital stating she is due. I could not find anything in recalls and neither could Caryl-Lyn. Please advise. Thanks.

## 2013-02-25 ENCOUNTER — Telehealth: Payer: Self-pay | Admitting: *Deleted

## 2013-02-25 NOTE — Telephone Encounter (Signed)
Right unilateral mammogram completed in March 2014.  Bilateral diagnostic studies w/ right [facility] views due in September.

## 2013-02-25 NOTE — Telephone Encounter (Signed)
Message for patient to call the office.  Patient needs to be scheduled for a September mammogram and office visit.

## 2013-02-25 NOTE — Telephone Encounter (Signed)
Jacqueline Mayotte, MD at 02/25/2013 8:30 AM   Status: Signed            Right unilateral mammogram completed in March 2014. Bilateral diagnostic studies w/ right [facility] views due in September.    I spoke with patient today and notified her accordingly.  Kim at the [facility] states that patient is not due for a bilateral study until January 2015. Her last bilateral exam was done 07-15-12. Should I place patient in recalls for January? Thanks.

## 2013-02-26 NOTE — Telephone Encounter (Signed)
Will determine timing of future mammograms after September appointment.

## 2013-03-01 ENCOUNTER — Telehealth: Payer: Self-pay | Admitting: *Deleted

## 2013-03-01 NOTE — Telephone Encounter (Signed)
Per Dr. Rutherford Nail final review, patient is due January 2015 for a bilateral diagnostic mammogram with right [facility] views and office visit to follow. A detailed message was left on patient's cell phone with this information. She will be placed in January 2015 recalls.

## 2013-03-28 ENCOUNTER — Other Ambulatory Visit: Payer: Self-pay | Admitting: Family Medicine

## 2013-04-20 ENCOUNTER — Other Ambulatory Visit: Payer: Self-pay | Admitting: Family Medicine

## 2013-05-13 ENCOUNTER — Other Ambulatory Visit: Payer: Self-pay

## 2013-07-15 ENCOUNTER — Encounter: Payer: Self-pay | Admitting: Internal Medicine

## 2013-07-15 ENCOUNTER — Ambulatory Visit (INDEPENDENT_AMBULATORY_CARE_PROVIDER_SITE_OTHER): Payer: Medicare HMO | Admitting: Internal Medicine

## 2013-07-15 VITALS — BP 118/68 | HR 91 | Temp 98.4°F | Wt 185.0 lb

## 2013-07-15 DIAGNOSIS — R05 Cough: Secondary | ICD-10-CM

## 2013-07-15 DIAGNOSIS — J209 Acute bronchitis, unspecified: Secondary | ICD-10-CM

## 2013-07-15 DIAGNOSIS — R059 Cough, unspecified: Secondary | ICD-10-CM

## 2013-07-15 DIAGNOSIS — J208 Acute bronchitis due to other specified organisms: Secondary | ICD-10-CM

## 2013-07-15 NOTE — Patient Instructions (Signed)

## 2013-07-15 NOTE — Progress Notes (Signed)
Pre-visit discussion using our clinic review tool. No additional management support is needed unless otherwise documented below in the visit note.  

## 2013-07-15 NOTE — Progress Notes (Signed)
HPI  Pt presents to the clinic today with c/o cough, fever and chest congestion. This started about 3 weeks ago. The cough is productive of thick green mucous. The cough is worse at night and first thing in the morning. She has had some nasal congestion as well.  She does report that she has been running fevers. She has taken tylenol, ibuprofen and robitussin. She has had sick contacts. She is a current smoker.   Review of Systems      Past Medical History  Diagnosis Date  . Hyperlipidemia   . Osteoporosis     Osteopenia  . MVP (mitral valve prolapse)   . Calcium oxalate renal stones   . Abnormal Pap smear of vagina   . Tobacco abuse   . Anxiety     Family History  Problem Relation Age of Onset  . Hypertension Mother   . Stroke Father 7  . Cancer Maternal Aunt     breast and lung  . Heart disease Maternal Grandmother   . Heart disease Maternal Grandfather   . Hypertension Paternal Grandmother     History   Social History  . Marital Status: Married    Spouse Name: N/A    Number of Children: 2  . Years of Education: N/A   Occupational History  . 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 Smoking status: Current Every Day Smoker -- 0.25 packs/day for 30 years    Types: Cigarettes  . Smokeless tobacco: Never Used  . Alcohol Use: 0.6 oz/week    1 Glasses of wine per week     Comment: occasional  . Drug Use: No  . Sexual Activity: Not on file   Other Topics Concern  . Not on file   Social History Narrative  . No narrative on file    No Known Allergies   Constitutional: Positive headache, fatigue and fever. Denies abrupt weight changes.  HEENT:  Positive sore throat. Denies eye redness, eye pain, pressure behind the eyes, facial pain, nasal congestion, ear pain, ringing in the ears, wax buildup, runny nose or bloody nose. Respiratory: Positive cough. Denies difficulty breathing or shortness of breath.  Cardiovascular: Denies chest pain, chest  tightness, palpitations or swelling in the hands or feet.   No other specific complaints in a complete review of systems (except as listed in HPI above).  Objective:   BP 118/68  Pulse 91  Temp(Src) 98.4 F (36.9 C) (Oral)  Wt 185 lb (83.915 kg)  SpO2 98% Wt Readings from Last 3 Encounters:  07/15/13 185 lb (83.915 kg)  02/15/13 187 lb 12 oz (85.163 kg)  09/08/12 188 lb (85.276 kg)     General: Appears her stated age, well developed, well nourished in NAD. HEENT: Head: normal shape and size; Eyes: sclera white, no icterus, conjunctiva pink, PERRLA and EOMs intact; Ears: Tm's gray and intact, normal light reflex; Nose: mucosa pink and moist, septum midline; Throat/Mouth: + PND. Teeth present, mucosa erythematous and moist, no exudate noted, no lesions or ulcerations noted.  Neck: Neck supple, trachea midline. No massses, lumps or thyromegaly present.  Cardiovascular: Normal rate and rhythm. S1,S2 noted.  No murmur, rubs or gallops noted. No JVD or BLE edema. No carotid bruits noted. Pulmonary/Chest: Normal effort and positive vesicular breath sounds. No respiratory distress. No wheezes, rales or ronchi noted.      Assessment & Plan:   Bronchitis, likely viral, with residual cough  Get some rest and drink plenty of  water Do salt water gargles for the sore throat Take an allergy pill OTC and Delsym for cough She declines RX for cough syrup with codeine today  RTC as needed or if symptoms persist.

## 2013-07-16 ENCOUNTER — Telehealth: Payer: Self-pay | Admitting: Family Medicine

## 2013-07-16 ENCOUNTER — Ambulatory Visit: Payer: Self-pay | Admitting: General Surgery

## 2013-07-16 LAB — HM MAMMOGRAPHY: HM Mammogram: NEGATIVE

## 2013-07-16 NOTE — Telephone Encounter (Signed)
Relevant patient education assigned to patient using Emmi. ° °

## 2013-07-19 ENCOUNTER — Encounter: Payer: Self-pay | Admitting: General Surgery

## 2013-08-02 ENCOUNTER — Ambulatory Visit (INDEPENDENT_AMBULATORY_CARE_PROVIDER_SITE_OTHER): Payer: Managed Care, Other (non HMO) | Admitting: General Surgery

## 2013-08-02 ENCOUNTER — Encounter: Payer: Self-pay | Admitting: General Surgery

## 2013-08-02 VITALS — BP 130/76 | HR 76 | Resp 14 | Ht 66.0 in | Wt 186.0 lb

## 2013-08-02 DIAGNOSIS — Z1239 Encounter for other screening for malignant neoplasm of breast: Secondary | ICD-10-CM

## 2013-08-02 DIAGNOSIS — R920 Mammographic microcalcification found on diagnostic imaging of breast: Secondary | ICD-10-CM

## 2013-08-02 NOTE — Progress Notes (Signed)
Patient ID: Jacqueline [name], female   DOB: Apr 11, 1951, 63 y.o.   MRN: [id number]  Chief Complaint  Patient presents with  . Follow-up    mammogram    HPI Jacqueline [name] is a 63 y.o. female who presents for a breast evaluation. The most recent mammogram was done on 07/16/13.  Patient does perform regular self breast checks and gets regular mammograms done.    HPI  Past Medical History  Diagnosis Date  . Hyperlipidemia   . Osteoporosis     Osteopenia  . MVP (mitral valve prolapse)   . Calcium oxalate renal stones   . Abnormal Pap smear of vagina   . Tobacco abuse   . Anxiety     Past Surgical History  Procedure Laterality Date  . Vaginal hysterectomy      class IV, ovaries intact  . Tonsillectomy and adenoidectomy    . Extraction of renal stones      Family History  Problem Relation Age of Onset  . Hypertension Mother   . Stroke Father 75  . Cancer Maternal Aunt     breast and lung  . Heart disease Maternal Grandmother   . Heart disease Maternal Grandfather   . Hypertension Paternal Grandmother     Social History History  Substance Use Topics  . Smoking status: Current Every Day Smoker -- 0.25 packs/day for 30 years    Types: Cigarettes  . Smokeless tobacco: Never Used  . Alcohol Use: 0.6 oz/week    1 Glasses of wine per week     Comment: occasional    No Known Allergies  Current Outpatient Prescriptions  Medication Sig Dispense Refill  . atorvastatin (LIPITOR) 10 MG tablet TAKE 1 TABLET (10MG ) BY MOUTH DAILY  90 tablet  1  . lisinopril (PRINIVIL,ZESTRIL) 10 MG tablet TAKE 1 TABLET DAILY  90 tablet  0  . Multiple Vitamin (MULTIVITAMIN) tablet Take 1 tablet by mouth daily.         No current facility-administered medications for this visit.    Review of Systems Review of Systems  Constitutional: Negative.   Respiratory: Negative.   Cardiovascular: Negative.     Blood pressure 130/76, pulse 76, resp. rate 14, height 5\' 6"  (1.676 m), weight 186 lb (84.369  kg).  Physical Exam Physical Exam  Constitutional: She is oriented to person, place, and time. She appears well-developed and well-nourished.  Eyes: Conjunctivae are normal.  Neck: Neck supple.  Cardiovascular: Normal rate, regular rhythm and normal heart sounds.   Pulmonary/Chest: Effort normal and breath sounds normal. Right breast exhibits no inverted nipple, no mass, no nipple discharge, no skin change and no tenderness. Left breast exhibits no inverted nipple, no mass, no nipple discharge, no skin change and no tenderness.  Lymphadenopathy:    She has no cervical adenopathy.    She has no axillary adenopathy.  Neurological: She is alert and oriented to person, place, and time.  Skin: Skin is warm and dry.    Data Reviewed Pathology dated August 19, 2012 showed benign breast tissue, primarily fibroadipose with scattered atrophic memory glands with polarized low calcium oxalate crystals noted.  Bilateral mammogram dated July 16, 2013 showed no discernible abnormality. BI-RAD-1.   Assessment    Benign breast exam.     Plan    The patient will resume annual screening mammograms with her primary care provider.        Robert Bellow 08/02/2013, 8:03 PM

## 2013-08-02 NOTE — Patient Instructions (Signed)
Patient to return as needed. 

## 2013-08-07 ENCOUNTER — Other Ambulatory Visit: Payer: Self-pay | Admitting: Family Medicine

## 2013-08-09 ENCOUNTER — Other Ambulatory Visit: Payer: Self-pay | Admitting: *Deleted

## 2013-08-09 NOTE — Telephone Encounter (Signed)
i have sent pt a message response through mychart, informing her that an ov is required for additional refills.

## 2013-08-10 ENCOUNTER — Telehealth: Payer: Self-pay | Admitting: Family Medicine

## 2013-08-10 MED ORDER — ATORVASTATIN CALCIUM 10 MG PO TABS: ORAL_TABLET | ORAL | Status: DC

## 2013-08-10 NOTE — Telephone Encounter (Signed)
Pt has scheduled CPE out in April (THe first morning CPE that was open) Pt is needing refill on Lisinopril  I need to refill the Lisinopril 10MG  as well as the Atorvastatin 10 MG... Changed supplier for meds... [facility]... Phone # [phone] Fax [phone] [place] ID# [id number] [phone] member is DENNETTE FAULCONER... (your office has copy of card)... (I am almost out of the Lisinopril.. have about a week left).... Thank you!!!! Call me if you need...[phone]

## 2013-08-10 NOTE — Telephone Encounter (Signed)
Spoke to pt and informed her Rx sent to requested pharmacy. F/u appt sched for 08/17/13;required for refill of lisinopril

## 2013-08-17 ENCOUNTER — Ambulatory Visit (INDEPENDENT_AMBULATORY_CARE_PROVIDER_SITE_OTHER): Payer: Medicare HMO | Admitting: Family Medicine

## 2013-08-17 ENCOUNTER — Encounter: Payer: Self-pay | Admitting: Family Medicine

## 2013-08-17 ENCOUNTER — Encounter: Payer: Self-pay | Admitting: *Deleted

## 2013-08-17 VITALS — BP 122/62 | HR 76 | Temp 98.1°F | Ht 64.0 in | Wt 187.2 lb

## 2013-08-17 DIAGNOSIS — Z0000 Encounter for general adult medical examination without abnormal findings: Secondary | ICD-10-CM

## 2013-08-17 DIAGNOSIS — E785 Hyperlipidemia, unspecified: Secondary | ICD-10-CM

## 2013-08-17 DIAGNOSIS — I10 Essential (primary) hypertension: Secondary | ICD-10-CM

## 2013-08-17 LAB — COMPREHENSIVE METABOLIC PANEL
ALBUMIN: 3.8 g/dL (ref 3.5–5.2)
ALT: 20 U/L (ref 0–35)
AST: 17 U/L (ref 0–37)
Alkaline Phosphatase: 61 U/L (ref 39–117)
BUN: 12 mg/dL (ref 6–23)
CALCIUM: 9.1 mg/dL (ref 8.4–10.5)
CHLORIDE: 108 meq/L (ref 96–112)
CO2: 27 mEq/L (ref 19–32)
Creatinine, Ser: 0.8 mg/dL (ref 0.4–1.2)
GFR: 74.02 mL/min (ref >60.00)
Glucose, Bld: 92 mg/dL (ref 70–99)
POTASSIUM: 4.3 meq/L (ref 3.5–5.1)
Sodium: 142 mEq/L (ref 135–145)
Total Bilirubin: 0.9 mg/dL (ref 0.3–1.2)
Total Protein: 7.1 g/dL (ref 6.0–8.3)

## 2013-08-17 LAB — CBC WITH DIFFERENTIAL/PLATELET
Basophils Absolute: 0 10*3/uL (ref 0.0–0.1)
Basophils Relative: 0.3 % (ref 0.0–3.0)
EOS PCT: 4.4 % (ref 0.0–5.0)
Eosinophils Absolute: 0.4 10*3/uL (ref 0.0–0.7)
HEMATOCRIT: 43 % (ref 36.0–46.0)
HEMOGLOBIN: 14.2 g/dL (ref 12.0–15.0)
LYMPHS ABS: 2.5 10*3/uL (ref 0.7–4.0)
Lymphocytes Relative: 28.2 % (ref 12.0–46.0)
MCHC: 33.1 g/dL (ref 30.0–36.0)
MCV: 92.1 fl (ref 78.0–100.0)
MONOS PCT: 9.1 % (ref 3.0–12.0)
Monocytes Absolute: 0.8 10*3/uL (ref 0.1–1.0)
NEUTROS ABS: 5.1 10*3/uL (ref 1.4–7.7)
Neutrophils Relative %: 58 % (ref 43.0–77.0)
Platelets: 270 10*3/uL (ref 150.0–400.0)
RBC: 4.66 Mil/uL (ref 3.87–5.11)
RDW: 13.8 % (ref 11.5–14.6)
WBC: 8.9 10*3/uL (ref 4.5–10.5)

## 2013-08-17 LAB — LIPID PANEL
CHOL/HDL RATIO: 3
Cholesterol: 146 mg/dL (ref 0–200)
HDL: 46.4 mg/dL (ref >39.00)
LDL Cholesterol: 78 mg/dL (ref 0–99)
TRIGLYCERIDES: 106 mg/dL (ref 0.0–149.0)
VLDL: 21.2 mg/dL (ref 0.0–40.0)

## 2013-08-17 LAB — TSH: TSH: 0.72 u[IU]/mL (ref 0.35–5.50)

## 2013-08-17 MED ORDER — LISINOPRIL 10 MG PO TABS: ORAL_TABLET | ORAL | Status: DC

## 2013-08-17 NOTE — Assessment & Plan Note (Signed)
Rx refilled for lipitor. Check labs today. Keep CPX appt in 10/2013.  Orders Placed This Encounter  Procedures  . Comprehensive metabolic panel  . Lipid panel  . CBC with Differential  . TSH

## 2013-08-17 NOTE — Progress Notes (Signed)
Subjective:    Patient ID: Jacqueline [name], female    DOB: April 22, 1951, 63 y.o.   MRN: [id number]  HPI  63 yo pleasant female here for rx refills.  She has CPX appt scheduled for 10/2013.  HDL- well controlled on Lipitor 10 mg daily.   Denies myalgias. Lab Results  Component Value Date   CHOL 147 06/18/2012   HDL 46.80 06/18/2012   LDLCALC 84 06/18/2012   LDLDIRECT 149.5 12/25/2006   TRIG 79.0 06/18/2012   CHOLHDL 3 06/18/2012   Lab Results  Component Value Date   ALT 21 06/18/2012   AST 18 06/18/2012   ALKPHOS 62 06/18/2012   BILITOT 0.8 06/18/2012   HTN-  BP has been well controlled on Lisinopril 10 mg daily. Denies any HA, blurred vision, CP, or SOB.  Patient Active Problem List   Diagnosis Date Noted  . HYPERTENSION 01/14/2008    Priority: High  . Breast screening 08/02/2013  . Routine general medical examination at a health care facility 06/25/2012  . Keratosis, seborrheic 10/24/2010  . LUMBAR STRAIN, ACUTE 08/11/2009  . OBESITY 12/25/2006  . CIGARETTE SMOKER 12/23/2006  . MITRAL VALVE PROLAPSE 12/23/2006  . RENAL CALCULUS 12/23/2006  . PAP SMEAR, ABNORMAL 12/23/2006  . HYPERLIPIDEMIA 11/11/2006  . OSTEOPENIA 11/11/2006  . PALPITATIONS 11/11/2006   Past Medical History  Diagnosis Date  . Hyperlipidemia   . Osteoporosis     Osteopenia  . MVP (mitral valve prolapse)   . Calcium oxalate renal stones   . Abnormal Pap smear of vagina   . Tobacco abuse   . Anxiety    Past Surgical History  Procedure Laterality Date  . Vaginal hysterectomy      class IV, ovaries intact  . Tonsillectomy and adenoidectomy    . Extraction of renal stones     History  Substance Use Topics  . Smoking status: Current Every Day Smoker -- 0.25 packs/day for 30 years    Types: Cigarettes  . Smokeless tobacco: Never Used  . Alcohol Use: 0.6 oz/week    1 Glasses of wine per week     Comment: occasional   Family History  Problem Relation Age of Onset  . Hypertension Mother   .  Stroke Father 84  . Cancer Maternal Aunt     breast and lung  . Heart disease Maternal Grandmother   . Heart disease Maternal Grandfather   . Hypertension Paternal Grandmother    No Known Allergies Current Outpatient Prescriptions on File Prior to Visit  Medication Sig Dispense Refill  . atorvastatin (LIPITOR) 10 MG tablet TAKE 1 TABLET (10MG ) BY MOUTH DAILY  90 tablet  1  . Multiple Vitamin (MULTIVITAMIN) tablet Take 1 tablet by mouth daily.         No current facility-administered medications on file prior to visit.   The PMH, PSH, Social History, Family History, Medications, and allergies have been reviewed in [facility], and have been updated if relevant.    Review of Systems See HPI No LE edema    Objective:   Physical Exam BP 122/62  Pulse 76  Temp(Src) 98.1 F (36.7 C) (Oral)  Ht 5\' 4"  (1.626 m)  Wt 187 lb 4 oz (84.936 kg)  BMI 32.13 kg/m2  SpO2 95%   General:  Well-developed,well-nourished,in no acute distress; alert,appropriate and cooperative throughout examination Head:  normocephalic and atraumatic.   Lungs:  Normal respiratory effort, chest expands symmetrically. Lungs are clear to auscultation, no crackles or wheezes. Heart:  Normal rate and  regular rhythm. S1 and S2 normal without gallop, murmur, click, rub or other extra sounds. Abdomen:  Bowel sounds positive,abdomen soft and non-tender without masses, organomegaly or hernias noted. Msk:  No deformity or scoliosis noted of thoracic or lumbar spine.   Extremities:  No clubbing, cyanosis, edema, or deformity noted with normal full range of motion of all joints.   Neurologic:  alert & oriented X3 and gait normal.   Skin:  Intact without suspicious lesions or rashes Psych:  Cognition and judgment appear intact. Alert and cooperative with normal attention span and concentration. No apparent delusions, illusions, hallucinations       Assessment & Plan:

## 2013-08-17 NOTE — Patient Instructions (Signed)
Great to see you. I will call you with your lab results and can you view them online. Keep your appointment for your physical.

## 2013-08-17 NOTE — Progress Notes (Signed)
Pre-visit discussion using our clinic review tool. No additional management support is needed unless otherwise documented below in the visit note.  

## 2013-08-17 NOTE — Assessment & Plan Note (Signed)
Well controlled. Rx refilled. No changes.

## 2013-08-18 ENCOUNTER — Telehealth: Payer: Self-pay | Admitting: Family Medicine

## 2013-08-18 NOTE — Telephone Encounter (Signed)
Relevant patient education assigned to patient using Emmi. ° °

## 2013-09-06 ENCOUNTER — Other Ambulatory Visit: Payer: Self-pay | Admitting: Family Medicine

## 2013-09-06 MED ORDER — LISINOPRIL 10 MG PO TABS: ORAL_TABLET | ORAL | Status: DC

## 2013-10-18 ENCOUNTER — Other Ambulatory Visit: Payer: Self-pay | Admitting: Family Medicine

## 2013-10-18 MED ORDER — LISINOPRIL 10 MG PO TABS: ORAL_TABLET | ORAL | Status: DC

## 2013-10-27 ENCOUNTER — Encounter: Payer: Self-pay | Admitting: Family Medicine

## 2013-10-27 ENCOUNTER — Ambulatory Visit (INDEPENDENT_AMBULATORY_CARE_PROVIDER_SITE_OTHER): Payer: Medicare HMO | Admitting: Family Medicine

## 2013-10-27 VITALS — BP 128/66 | HR 62 | Temp 98.1°F | Ht 64.5 in | Wt 188.8 lb

## 2013-10-27 DIAGNOSIS — E785 Hyperlipidemia, unspecified: Secondary | ICD-10-CM

## 2013-10-27 DIAGNOSIS — Z23 Encounter for immunization: Secondary | ICD-10-CM

## 2013-10-27 DIAGNOSIS — Z0000 Encounter for general adult medical examination without abnormal findings: Secondary | ICD-10-CM

## 2013-10-27 DIAGNOSIS — I10 Essential (primary) hypertension: Secondary | ICD-10-CM

## 2013-10-27 DIAGNOSIS — F17200 Nicotine dependence, unspecified, uncomplicated: Secondary | ICD-10-CM

## 2013-10-27 MED ORDER — VARENICLINE TARTRATE 0.5 MG X 11 & 1 MG X 42 PO MISC: ORAL | Status: DC

## 2013-10-27 NOTE — Progress Notes (Signed)
Pre visit review using our clinic review tool, if applicable. No additional management support is needed unless otherwise documented below in the visit note. 

## 2013-10-27 NOTE — Assessment & Plan Note (Signed)
Well controlled on current rx. No changes. Does not need refills today.

## 2013-10-27 NOTE — Assessment & Plan Note (Signed)
Well controlled on current rx. No changes. 

## 2013-10-27 NOTE — Progress Notes (Signed)
Subjective:    Patient ID: Jacqueline [name], female    DOB: May 06, 1951, 63 y.o.   MRN: [id number]  HPI  63 yo pleasant female here for CPX.   S/p hysterectomy Colonoscopy 08/14/12 Olevia Perches)- 10 year recall. Mammogram 07/16/2013.   HDL- well controlled on Lipitor 10 mg daily.   Denies myalgias. Lab Results  Component Value Date   CHOL 146 08/17/2013   HDL 46.40 08/17/2013   LDLCALC 78 08/17/2013   LDLDIRECT 149.5 12/25/2006   TRIG 106.0 08/17/2013   CHOLHDL 3 08/17/2013   Lab Results  Component Value Date   ALT 20 08/17/2013   AST 17 08/17/2013   ALKPHOS 61 08/17/2013   BILITOT 0.9 08/17/2013   HTN-  BP has been well controlled on Lisinopril 10 mg daily. Denies any HA, blurred vision, CP, or SOB.  Lab Results  Component Value Date   CREATININE 0.8 08/17/2013   Tobacco abuse- smokes 4-5 cigs per day.  She has never quit.  She is motivated to quit now- has small grandchildren.  Denies cough, night sweats or hemoptysis.  Patient Active Problem List   Diagnosis Date Noted  . HYPERTENSION 01/14/2008    Priority: High  . HYPERLIPIDEMIA 11/11/2006    Priority: High  . Routine general medical examination at a health care facility 06/25/2012  . OBESITY 12/25/2006  . CIGARETTE SMOKER 12/23/2006  . MITRAL VALVE PROLAPSE 12/23/2006  . RENAL CALCULUS 12/23/2006  . OSTEOPENIA 11/11/2006  . PALPITATIONS 11/11/2006   Past Medical History  Diagnosis Date  . Hyperlipidemia   . Osteoporosis     Osteopenia  . MVP (mitral valve prolapse)   . Calcium oxalate renal stones   . Abnormal Pap smear of vagina   . Tobacco abuse   . Anxiety    Past Surgical History  Procedure Laterality Date  . Vaginal hysterectomy      class IV, ovaries intact  . Tonsillectomy and adenoidectomy    . Extraction of renal stones     History  Substance Use Topics  . Smoking status: Current Every Day Smoker -- 0.25 packs/day for 30 years    Types: Cigarettes  . Smokeless tobacco: Never Used  . Alcohol Use: 0.6  oz/week    1 Glasses of wine per week     Comment: occasional   Family History  Problem Relation Age of Onset  . Hypertension Mother   . Stroke Father 47  . Cancer Maternal Aunt     breast and lung  . Heart disease Maternal Grandmother   . Heart disease Maternal Grandfather   . Hypertension Paternal Grandmother    No Known Allergies Current Outpatient Prescriptions on File Prior to Visit  Medication Sig Dispense Refill  . atorvastatin (LIPITOR) 10 MG tablet TAKE 1 TABLET (10MG ) BY MOUTH DAILY  90 tablet  1  . lisinopril (PRINIVIL,ZESTRIL) 10 MG tablet TAKE 1 TABLET DAILY  30 tablet  0  . Multiple Vitamin (MULTIVITAMIN) tablet Take 1 tablet by mouth daily.         No current facility-administered medications on file prior to visit.   The PMH, PSH, Social History, Family History, Medications, and allergies have been reviewed in [facility], and have been updated if relevant.    Review of Systems See HPI + Intermittent left sided sciatica-improving with water aerobic  Patient reports no  vision/ hearing changes,anorexia, weight change, fever ,adenopathy, persistant / recurrent hoarseness, swallowing issues, chest pain, edema,persistant / recurrent cough, hemoptysis, dyspnea(rest, exertional, paroxysmal nocturnal), gastrointestinal  bleeding (  melena, rectal bleeding), abdominal pain, excessive heart burn, GU symptoms(dysuria, hematuria, pyuria, voiding/incontinence  Issues) syncope, focal weakness, severe memory loss, concerning skin lesions, depression, anxiety, abnormal bruising/bleeding, major joint swelling, breast masses or abnormal vaginal bleeding.       Objective:   Physical Exam BP 128/66  Pulse 62  Temp(Src) 98.1 F (36.7 C) (Oral)  Ht 5' 4.5" (1.638 m)  Wt 188 lb 12 oz (85.616 kg)  BMI 31.91 kg/m2  SpO2 98%   General:  Well-developed,well-nourished,in no acute distress; alert,appropriate and cooperative throughout examination Head:  normocephalic and atraumatic.   Eyes:   vision grossly intact, pupils equal, pupils round, and pupils reactive to light.   Ears:  R ear normal and L ear normal.   Nose:  no external deformity.   Mouth:  good dentition.   Neck:  No deformities, masses, or tenderness noted. Breasts:  No mass, nodules, thickening, tenderness, bulging, retraction, inflamation, nipple discharge or skin changes noted.   Lungs:  Normal respiratory effort, chest expands symmetrically. Lungs are clear to auscultation, no crackles or wheezes. Heart:  Normal rate and regular rhythm. S1 and S2 normal without gallop, murmur, click, rub or other extra sounds. Abdomen:  Bowel sounds positive,abdomen soft and non-tender without masses, organomegaly or hernias noted. Msk:  No deformity or scoliosis noted of thoracic or lumbar spine.   Extremities:  No clubbing, cyanosis, edema, or deformity noted with normal full range of motion of all joints.   Neurologic:  alert & oriented X3 and gait normal.   Skin:  Intact without suspicious lesions or rashes Psych:  Cognition and judgment appear intact. Alert and cooperative with normal attention span and concentration. No apparent delusions, illusions, hallucinations       Assessment & Plan:

## 2013-10-27 NOTE — Patient Instructions (Signed)
Great to see you. I am proud of you for quitting! Pick a quit date- continue smoke when you first start taking Chantix.  Your labs look great.

## 2013-10-27 NOTE — Assessment & Plan Note (Signed)
Reviewed preventive care protocols, scheduled due services, and updated immunizations Discussed nutrition, exercise, diet, and healthy lifestyle.  Zostavax today. 

## 2013-10-27 NOTE — Assessment & Plan Note (Signed)
Ready to quit. Given rx for Chantix. See AVS.

## 2013-10-27 NOTE — Addendum Note (Signed)
Addended by: Modena Nunnery on: 10/27/2013 09:31 AM   Modules accepted: Orders

## 2013-11-03 ENCOUNTER — Other Ambulatory Visit: Payer: Self-pay | Admitting: Family Medicine

## 2013-11-10 ENCOUNTER — Other Ambulatory Visit: Payer: Self-pay | Admitting: Family Medicine

## 2013-11-11 ENCOUNTER — Other Ambulatory Visit: Payer: Self-pay | Admitting: Family Medicine

## 2013-11-11 MED ORDER — LISINOPRIL 10 MG PO TABS: 10.0000 mg | ORAL_TABLET | Freq: Every day | ORAL | Status: DC

## 2013-11-11 NOTE — Telephone Encounter (Signed)
rx sent to pharmacy by e-script  

## 2014-04-30 ENCOUNTER — Other Ambulatory Visit: Payer: Self-pay | Admitting: Family Medicine

## 2014-05-09 ENCOUNTER — Encounter: Payer: Self-pay | Admitting: Family Medicine

## 2014-05-11 ENCOUNTER — Encounter: Payer: Self-pay | Admitting: Family Medicine

## 2014-05-11 ENCOUNTER — Ambulatory Visit (INDEPENDENT_AMBULATORY_CARE_PROVIDER_SITE_OTHER): Payer: Medicare HMO | Admitting: Family Medicine

## 2014-05-11 VITALS — BP 116/68 | HR 66 | Temp 98.0°F | Wt 192.0 lb

## 2014-05-11 DIAGNOSIS — I10 Essential (primary) hypertension: Secondary | ICD-10-CM

## 2014-05-11 DIAGNOSIS — E785 Hyperlipidemia, unspecified: Secondary | ICD-10-CM

## 2014-05-11 DIAGNOSIS — Z720 Tobacco use: Secondary | ICD-10-CM

## 2014-05-11 DIAGNOSIS — F17200 Nicotine dependence, unspecified, uncomplicated: Secondary | ICD-10-CM

## 2014-05-11 NOTE — Assessment & Plan Note (Signed)
Wanting to quit on her own. She wants to keep chantix on her med list as she may decide to fill it.

## 2014-05-11 NOTE — Patient Instructions (Addendum)
Great to see you. Say hello to your family for me.

## 2014-05-11 NOTE — Assessment & Plan Note (Signed)
Well controlled on current rx. No changes made. 

## 2014-05-11 NOTE — Progress Notes (Signed)
Pre visit review using our clinic review tool, if applicable. No additional management support is needed unless otherwise documented below in the visit note. 

## 2014-05-11 NOTE — Assessment & Plan Note (Signed)
At goal. No changes made 

## 2014-05-11 NOTE — Progress Notes (Signed)
Subjective:    Patient ID: Jacqueline [name], female    DOB: January 18, 1951, 63 y.o.   MRN: [id number]  HPI  63 yo pleasant female here for rx refills.    HDL- well controlled on Lipitor 10 mg daily.   Denies myalgias. Lab Results  Component Value Date   CHOL 146 08/17/2013   HDL 46.40 08/17/2013   LDLCALC 78 08/17/2013   LDLDIRECT 149.5 12/25/2006   TRIG 106.0 08/17/2013   CHOLHDL 3 08/17/2013   Lab Results  Component Value Date   ALT 20 08/17/2013   AST 17 08/17/2013   ALKPHOS 61 08/17/2013   BILITOT 0.9 08/17/2013   HTN-  BP has been well controlled on Lisinopril 10 mg daily. Denies any HA, blurred vision, CP, or SOB.  Lab Results  Component Value Date   CREATININE 0.8 08/17/2013   Tobacco abuse- smoking less- maybe 2 cigs per day- feels it's more of a habit at this point. Given rx for chantix at last office visit.  She never filled it-wants to try to quit on her own first.  Patient Active Problem List   Diagnosis Date Noted  . HYPERTENSION 01/14/2008    Priority: High  . HYPERLIPIDEMIA 11/11/2006    Priority: High  . OBESITY 12/25/2006  . CIGARETTE SMOKER 12/23/2006  . MITRAL VALVE PROLAPSE 12/23/2006  . RENAL CALCULUS 12/23/2006  . OSTEOPENIA 11/11/2006  . PALPITATIONS 11/11/2006   Past Medical History  Diagnosis Date  . Hyperlipidemia   . Osteoporosis     Osteopenia  . MVP (mitral valve prolapse)   . Calcium oxalate renal stones   . Abnormal Pap smear of vagina   . Tobacco abuse   . Anxiety    Past Surgical History  Procedure Laterality Date  . Vaginal hysterectomy      class IV, ovaries intact  . Tonsillectomy and adenoidectomy    . Extraction of renal stones     History  Substance Use Topics  . Smoking status: Current Every Day Smoker -- 0.25 packs/day for 30 years    Types: Cigarettes  . Smokeless tobacco: Never Used  . Alcohol Use: 0.6 oz/week    1 Glasses of wine per week     Comment: occasional   Family History  Problem Relation Age of  Onset  . Hypertension Mother   . Stroke Father 32  . Cancer Maternal Aunt     breast and lung  . Heart disease Maternal Grandmother   . Heart disease Maternal Grandfather   . Hypertension Paternal Grandmother    No Known Allergies Current Outpatient Prescriptions on File Prior to Visit  Medication Sig Dispense Refill  . atorvastatin (LIPITOR) 10 MG tablet TAKE 1 TABLET DAILY 90 tablet 0  . lisinopril (PRINIVIL,ZESTRIL) 10 MG tablet TAKE 1 TABLET DAILY 90 tablet 0  . Multiple Vitamin (MULTIVITAMIN) tablet Take 1 tablet by mouth daily.      . varenicline (CHANTIX STARTING MONTH PAK) 0.5 MG X 11 & 1 MG X 42 tablet Take one 0.5 mg tablet by mouth once daily for 3 days, then increase to one 0.5 mg tablet twice daily for 4 days, then increase to one 1 mg tablet twice daily. 53 tablet 0   No current facility-administered medications on file prior to visit.   The PMH, PSH, Social History, Family History, Medications, and allergies have been reviewed in [facility], and have been updated if relevant.    Review of Systems See HPI No LE edema   No HA,  blurred vision, CP or SOB Objective:   Physical Exam BP 116/68 mmHg  Pulse 66  Temp(Src) 98 F (36.7 C) (Oral)  Wt 192 lb (87.091 kg)  SpO2 97%   General:  Well-developed,well-nourished,in no acute distress; alert,appropriate and cooperative throughout examination Head:  normocephalic and atraumatic.   Lungs:  Normal respiratory effort, chest expands symmetrically. Lungs are clear to auscultation, no crackles or wheezes. Heart:  Normal rate and regular rhythm. S1 and S2 normal without gallop, murmur, click, rub or other extra sounds. Abdomen:  Bowel sounds positive,abdomen soft and non-tender without masses, organomegaly or hernias noted. Msk:  No deformity or scoliosis noted of thoracic or lumbar spine.   Extremities:  No clubbing, cyanosis, edema, or deformity noted with normal full range of motion of all joints.   Neurologic:  alert &  oriented X3 and gait normal.   Skin:  Intact without suspicious lesions or rashes Psych:  Cognition and judgment appear intact. Alert and cooperative with normal attention span and concentration. No apparent delusions, illusions, hallucinations       Assessment & Plan:

## 2014-07-29 ENCOUNTER — Other Ambulatory Visit: Payer: Self-pay | Admitting: Family Medicine

## 2014-08-01 NOTE — Telephone Encounter (Signed)
Received refill request electronically from pharmacy. Last refill note was sent that patient needs office visit for further refills. No upcoming appointment schedule. Is it okay to refill medication?

## 2014-08-02 ENCOUNTER — Other Ambulatory Visit: Payer: Self-pay | Admitting: Family Medicine

## 2014-08-03 MED ORDER — LISINOPRIL 10 MG PO TABS: 10.0000 mg | ORAL_TABLET | Freq: Every day | ORAL | Status: DC

## 2014-08-03 MED ORDER — ATORVASTATIN CALCIUM 10 MG PO TABS: 10.0000 mg | ORAL_TABLET | Freq: Every day | ORAL | Status: DC

## 2014-08-17 ENCOUNTER — Ambulatory Visit: Payer: Self-pay | Admitting: Family Medicine

## 2014-08-18 ENCOUNTER — Encounter: Payer: Self-pay | Admitting: Family Medicine

## 2014-08-23 ENCOUNTER — Ambulatory Visit: Payer: Self-pay | Admitting: Family Medicine

## 2014-08-24 ENCOUNTER — Encounter: Payer: Self-pay | Admitting: Family Medicine

## 2014-09-15 ENCOUNTER — Ambulatory Visit (INDEPENDENT_AMBULATORY_CARE_PROVIDER_SITE_OTHER): Payer: Managed Care, Other (non HMO) | Admitting: Primary Care

## 2014-09-15 ENCOUNTER — Encounter: Payer: Self-pay | Admitting: Primary Care

## 2014-09-15 VITALS — BP 118/68 | HR 73 | Temp 98.8°F | Ht 64.0 in | Wt 192.4 lb

## 2014-09-15 DIAGNOSIS — J069 Acute upper respiratory infection, unspecified: Secondary | ICD-10-CM

## 2014-09-15 MED ORDER — BENZONATATE 100 MG PO CAPS: 100.0000 mg | ORAL_CAPSULE | Freq: Three times a day (TID) | ORAL | Status: DC | PRN

## 2014-09-15 MED ORDER — FLUTICASONE PROPIONATE 50 MCG/ACT NA SUSP: 2.0000 | Freq: Every day | NASAL | Status: DC

## 2014-09-15 NOTE — Progress Notes (Signed)
Pre visit review using our clinic review tool, if applicable. No additional management support is needed unless otherwise documented below in the visit note. 

## 2014-09-15 NOTE — Assessment & Plan Note (Signed)
Suspect viral in origin. Mucinex DM, Flonase, tylenol as needed. Tessalon Pearls for cough. Fluids, rest. Call if symptoms worsen or no improvement by day 10.

## 2014-09-15 NOTE — Patient Instructions (Addendum)
Start Tessalon Pearls three times daily as needed for cough. Try Mucinex DM for chest congestion, tylenol for fevers. Use Flonase daily for nasal congestion. Debrox Wax Removal Kit: Found at any drugstore. Call if no improvement or worsening symptoms next week. I hope you feel better soon!

## 2014-09-15 NOTE — Progress Notes (Signed)
Subjective:    Patient ID: Jacqueline [name] [facility], female    DOB: Jun 08, 1951, 64 y.o.   MRN: [id number]  HPI  [name] is a 64 year old female who presents today with a chief complaint of non prodcutive cough since Sunday night with maxillary facial pressure and chest congestion since yesterday. She reports a fever with chills last night which have since resolved.  Symptoms are worse at night and early morning. She's taken tylenol for fever and theraflu for evening symptoms which did helped her to sleep. Cough has not worsened and fever has not returned.   Review of Systems  Constitutional: Positive for fever and chills. Negative for fatigue.  HENT: Positive for congestion, rhinorrhea and sinus pressure. Negative for ear pain, postnasal drip and sore throat.   Eyes: Negative for itching.  Respiratory: Positive for cough. Negative for shortness of breath.   Cardiovascular: Negative for chest pain.  Gastrointestinal: Negative for nausea and vomiting.  Musculoskeletal: Negative for myalgias.  Neurological: Positive for headaches. Negative for dizziness.       Past Medical History  Diagnosis Date  . Hyperlipidemia   . Osteoporosis     Osteopenia  . MVP (mitral valve prolapse)   . Calcium oxalate renal stones   . Abnormal Pap smear of vagina   . Tobacco abuse   . Anxiety     History   Social History  . Marital Status: Married    Spouse Name: N/A  . Number of Children: 2  . Years of Education: N/A   Occupational History  . 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 Smoking status: Current Every Day Smoker -- 0.25 packs/day for 30 years    Types: Cigarettes  . Smokeless tobacco: Never Used  . Alcohol Use: 0.6 oz/week    1 Glasses of wine per week     Comment: occasional  . Drug Use: No  . Sexual Activity: Not on file   Other Topics Concern  . Not on file   Social History Narrative    Past Surgical History  Procedure Laterality Date  . Vaginal  hysterectomy      class IV, ovaries intact  . Tonsillectomy and adenoidectomy    . Extraction of renal stones      Family History  Problem Relation Age of Onset  . Hypertension Mother   . Stroke Father 46  . Cancer Maternal Aunt     breast and lung  . Heart disease Maternal Grandmother   . Heart disease Maternal Grandfather   . Hypertension Paternal Grandmother     No Known Allergies  Current Outpatient Prescriptions on File Prior to Visit  Medication Sig Dispense Refill  . atorvastatin (LIPITOR) 10 MG tablet Take 1 tablet (10 mg total) by mouth daily. 90 tablet 0  . lisinopril (PRINIVIL,ZESTRIL) 10 MG tablet Take 1 tablet (10 mg total) by mouth daily. 90 tablet 3  . Multiple Vitamin (MULTIVITAMIN) tablet Take 1 tablet by mouth daily.      . varenicline (CHANTIX STARTING MONTH PAK) 0.5 MG X 11 & 1 MG X 42 tablet Take one 0.5 mg tablet by mouth once daily for 3 days, then increase to one 0.5 mg tablet twice daily for 4 days, then increase to one 1 mg tablet twice daily. 53 tablet 0   No current facility-administered medications on file prior to visit.    BP 118/68 mmHg  Pulse 73  Temp(Src) 98.8 F (37.1 C) (Oral)  Ht  5\' 4"  (1.626 m)  Wt 192 lb 6.4 oz (87.272 kg)  BMI 33.01 kg/m2  SpO2 95%    Objective:   Physical Exam  Constitutional: She is oriented to person, place, and time. She appears well-developed.  HENT:  Head: Normocephalic.  Right Ear: External ear normal.  Left Ear: External ear normal.  Nose: Nose normal.  Mouth/Throat: Oropharynx is clear and moist.  Eyes: Conjunctivae are normal. Pupils are equal, round, and reactive to light.  Neck: Neck supple.  Cardiovascular: Normal rate and regular rhythm.   Pulmonary/Chest: Effort normal. She has no wheezes. She has rales.  Lymphadenopathy:    She has no cervical adenopathy.  Neurological: She is alert and oriented to person, place, and time.  Skin: Skin is warm and dry.  Psychiatric: She has a normal mood  and affect.          Assessment & Plan:

## 2015-02-07 ENCOUNTER — Other Ambulatory Visit: Payer: Self-pay | Admitting: Family Medicine

## 2015-02-08 ENCOUNTER — Encounter: Payer: Self-pay | Admitting: Family Medicine

## 2015-02-08 MED ORDER — ATORVASTATIN CALCIUM 10 MG PO TABS
10.0000 mg | ORAL_TABLET | Freq: Every day | ORAL | Status: DC
Start: 2015-02-08 — End: 2015-04-24

## 2015-02-17 ENCOUNTER — Encounter: Payer: Self-pay | Admitting: Family Medicine

## 2015-02-17 MED ORDER — LISINOPRIL 10 MG PO TABS: 10.0000 mg | ORAL_TABLET | Freq: Every day | ORAL | Status: DC

## 2015-02-21 ENCOUNTER — Encounter: Payer: Self-pay | Admitting: Family Medicine

## 2015-02-21 ENCOUNTER — Other Ambulatory Visit (INDEPENDENT_AMBULATORY_CARE_PROVIDER_SITE_OTHER): Payer: Managed Care, Other (non HMO)

## 2015-02-21 ENCOUNTER — Other Ambulatory Visit: Payer: Self-pay | Admitting: Family Medicine

## 2015-02-21 ENCOUNTER — Ambulatory Visit: Payer: Managed Care, Other (non HMO) | Admitting: Family Medicine

## 2015-02-21 VITALS — HR 68 | Temp 98.0°F | Wt 189.5 lb

## 2015-02-21 DIAGNOSIS — Z01419 Encounter for gynecological examination (general) (routine) without abnormal findings: Secondary | ICD-10-CM

## 2015-02-21 DIAGNOSIS — E785 Hyperlipidemia, unspecified: Secondary | ICD-10-CM

## 2015-02-21 DIAGNOSIS — Z0000 Encounter for general adult medical examination without abnormal findings: Secondary | ICD-10-CM

## 2015-02-21 LAB — LIPID PANEL
CHOLESTEROL: 134 mg/dL (ref 0–200)
HDL: 47.3 mg/dL (ref >39.00)
LDL Cholesterol: 69 mg/dL (ref 0–99)
NonHDL: 87.06
TRIGLYCERIDES: 88 mg/dL (ref 0.0–149.0)
Total CHOL/HDL Ratio: 3
VLDL: 17.6 mg/dL (ref 0.0–40.0)

## 2015-02-21 LAB — COMPREHENSIVE METABOLIC PANEL
ALBUMIN: 3.9 g/dL (ref 3.5–5.2)
ALT: 19 U/L (ref 0–35)
AST: 19 U/L (ref 0–37)
Alkaline Phosphatase: 55 U/L (ref 39–117)
BILIRUBIN TOTAL: 0.5 mg/dL (ref 0.2–1.2)
BUN: 14 mg/dL (ref 6–23)
CALCIUM: 9.2 mg/dL (ref 8.4–10.5)
CO2: 29 mEq/L (ref 19–32)
CREATININE: 0.83 mg/dL (ref 0.40–1.20)
Chloride: 104 mEq/L (ref 96–112)
GFR: 73.66 mL/min (ref >60.00)
Glucose, Bld: 90 mg/dL (ref 70–99)
Potassium: 4.6 mEq/L (ref 3.5–5.1)
Sodium: 139 mEq/L (ref 135–145)
TOTAL PROTEIN: 6.8 g/dL (ref 6.0–8.3)

## 2015-02-21 LAB — CBC WITH DIFFERENTIAL/PLATELET
BASOS ABS: 0 10*3/uL (ref 0.0–0.1)
BASOS PCT: 0.4 % (ref 0.0–3.0)
EOS ABS: 0.4 10*3/uL (ref 0.0–0.7)
Eosinophils Relative: 4.1 % (ref 0.0–5.0)
HEMATOCRIT: 42.9 % (ref 36.0–46.0)
HEMOGLOBIN: 14.2 g/dL (ref 12.0–15.0)
LYMPHS PCT: 27.7 % (ref 12.0–46.0)
Lymphs Abs: 2.6 10*3/uL (ref 0.7–4.0)
MCHC: 33.1 g/dL (ref 30.0–36.0)
MCV: 92.9 fl (ref 78.0–100.0)
Monocytes Absolute: 1 10*3/uL (ref 0.1–1.0)
Monocytes Relative: 10.1 % (ref 3.0–12.0)
Neutro Abs: 5.5 10*3/uL (ref 1.4–7.7)
Neutrophils Relative %: 57.7 % (ref 43.0–77.0)
Platelets: 242 10*3/uL (ref 150.0–400.0)
RBC: 4.62 Mil/uL (ref 3.87–5.11)
RDW: 13.5 % (ref 11.5–15.5)
WBC: 9.5 10*3/uL (ref 4.0–10.5)

## 2015-02-21 LAB — TSH: TSH: 1.47 u[IU]/mL (ref 0.35–4.50)

## 2015-02-21 NOTE — Progress Notes (Unsigned)
Pre visit review using our clinic review tool, if applicable. No additional management support is needed unless otherwise documented below in the visit note. 

## 2015-03-02 ENCOUNTER — Encounter: Payer: Self-pay | Admitting: Family Medicine

## 2015-03-02 ENCOUNTER — Ambulatory Visit (INDEPENDENT_AMBULATORY_CARE_PROVIDER_SITE_OTHER): Payer: Managed Care, Other (non HMO) | Admitting: Family Medicine

## 2015-03-02 VITALS — BP 114/60 | HR 59 | Temp 98.1°F | Ht 64.5 in | Wt 188.0 lb

## 2015-03-02 DIAGNOSIS — Z0000 Encounter for general adult medical examination without abnormal findings: Secondary | ICD-10-CM | POA: Diagnosis not present

## 2015-03-02 DIAGNOSIS — F17200 Nicotine dependence, unspecified, uncomplicated: Secondary | ICD-10-CM

## 2015-03-02 DIAGNOSIS — Z01419 Encounter for gynecological examination (general) (routine) without abnormal findings: Secondary | ICD-10-CM

## 2015-03-02 DIAGNOSIS — Z720 Tobacco use: Secondary | ICD-10-CM | POA: Diagnosis not present

## 2015-03-02 DIAGNOSIS — E785 Hyperlipidemia, unspecified: Secondary | ICD-10-CM | POA: Diagnosis not present

## 2015-03-02 DIAGNOSIS — I059 Rheumatic mitral valve disease, unspecified: Secondary | ICD-10-CM

## 2015-03-02 DIAGNOSIS — E669 Obesity, unspecified: Secondary | ICD-10-CM

## 2015-03-02 DIAGNOSIS — I10 Essential (primary) hypertension: Secondary | ICD-10-CM

## 2015-03-02 MED ORDER — VARENICLINE TARTRATE 0.5 MG X 11 & 1 MG X 42 PO MISC: ORAL | Status: DC

## 2015-03-02 NOTE — Assessment & Plan Note (Signed)
Well controlled on low dose lipitor. No changes made.

## 2015-03-02 NOTE — Assessment & Plan Note (Signed)
Ready to quit. Smoking cessation instruction/counseling given:  counseled patient on the dangers of tobacco use, advised patient to stop smoking, and reviewed strategies to maximize success  Chantix rx given to patient.

## 2015-03-02 NOTE — Progress Notes (Signed)
Subjective:    Patient ID: Jacqueline [name] [facility], female    DOB: [birth year], 64 y.o.   MRN: [id number]  HPI  64 yo pleasant female with h/o HLD, HTN here for CPX and follow up of chronic medical conditions.    Remote h/o hysterectomy Colonoscopy 08/14/12 Olevia Perches)- 10 year recall. Mammogram 08/23/14 Zostavax 10/27/13 Tdap 11/27/09    HLD- well controlled on Lipitor 10 mg daily.   Denies myalgias. Lab Results  Component Value Date   CHOL 134 02/21/2015   HDL 47.30 02/21/2015   LDLCALC 69 02/21/2015   LDLDIRECT 149.5 12/25/2006   TRIG 88.0 02/21/2015   CHOLHDL 3 02/21/2015   Lab Results  Component Value Date   ALT 19 02/21/2015   AST 19 02/21/2015   ALKPHOS 55 02/21/2015   BILITOT 0.5 02/21/2015   HTN-  BP has been well controlled on Lisinopril 10 mg daily. Denies any HA, blurred vision, CP, or SOB.  Lab Results  Component Value Date   CREATININE 0.83 02/21/2015   Tobacco abuse- smokes 4-5 cigs per day.  She has never quit.  I have prescribed chantix in past but she never filled it, would rather try to quit on her own. Now she is finally ready to quit and try chantix again. Patient Active Problem List   Diagnosis Date Noted  . Essential hypertension 01/14/2008    Priority: High  . HLD (hyperlipidemia) 11/11/2006    Priority: High  . Well woman exam 03/02/2015  . Obesity 12/25/2006  . CIGARETTE SMOKER 12/23/2006  . Mitral valve disorder 12/23/2006  . RENAL CALCULUS 12/23/2006  . OSTEOPENIA 11/11/2006  . PALPITATIONS 11/11/2006   Past Medical History  Diagnosis Date  . Hyperlipidemia   . Osteoporosis     Osteopenia  . MVP (mitral valve prolapse)   . Calcium oxalate renal stones   . Abnormal Pap smear of vagina   . Tobacco abuse   . Anxiety    Past Surgical History  Procedure Laterality Date  . Vaginal hysterectomy      class IV, ovaries intact  . Tonsillectomy and adenoidectomy    . Extraction of renal stones     Social History  Substance Use Topics  .  Smoking status: Current Every Day Smoker -- 0.25 packs/day for 30 years    Types: Cigarettes  . Smokeless tobacco: Never Used  . Alcohol Use: 0.6 oz/week    1 Glasses of wine per week     Comment: occasional   Family History  Problem Relation Age of Onset  . Hypertension Mother   . Stroke Father 51  . Cancer Maternal Aunt     breast and lung  . Heart disease Maternal Grandmother   . Heart disease Maternal Grandfather   . Hypertension Paternal Grandmother    No Known Allergies Current Outpatient Prescriptions on File Prior to Visit  Medication Sig Dispense Refill  . atorvastatin (LIPITOR) 10 MG tablet Take 1 tablet (10 mg total) by mouth daily. Office visit with labs required for additional refills 90 tablet 0  . lisinopril (PRINIVIL,ZESTRIL) 10 MG tablet Take 1 tablet (10 mg total) by mouth daily. 7 tablet 0  . Multiple Vitamin (MULTIVITAMIN) tablet Take 1 tablet by mouth daily.       No current facility-administered medications on file prior to visit.   The PMH, PSH, Social History, Family History, Medications, and allergies have been reviewed in [facility], and have been updated if relevant.    Review of Systems  Constitutional: Negative.  HENT: Negative.   Eyes: Negative.   Respiratory: Negative.   Cardiovascular: Negative.   Gastrointestinal: Negative.   Endocrine: Negative.   Genitourinary: Negative.   Musculoskeletal: Negative.   Skin: Negative.   Allergic/Immunologic: Negative.   Neurological: Negative.   Hematological: Negative.   Psychiatric/Behavioral: Negative.   All other systems reviewed and are negative.       Objective:   Physical Exam BP 114/60 mmHg  Pulse 59  Temp(Src) 98.1 F (36.7 C) (Oral)  Ht 5' 4.5" (1.638 m)  Wt 188 lb (85.276 kg)  BMI 31.78 kg/m2  SpO2 96%  Wt Readings from Last 3 Encounters:  03/02/15 188 lb (85.276 kg)  02/21/15 189 lb 8 oz (85.957 kg)  09/15/14 192 lb 6.4 oz (87.272 kg)    General:   Well-developed,well-nourished,in no acute distress; alert,appropriate and cooperative throughout examination Head:  normocephalic and atraumatic.   Eyes:  vision grossly intact, pupils equal, pupils round, and pupils reactive to light.   Ears:  R ear normal and L ear normal.   Nose:  no external deformity.   Mouth:  good dentition.   Neck:  No deformities, masses, or tenderness noted. Breasts:  No mass, nodules, thickening, tenderness, bulging, retraction, inflamation, nipple discharge or skin changes noted.   Lungs:  Normal respiratory effort, chest expands symmetrically. Lungs are clear to auscultation, no crackles or wheezes. Heart:  Normal rate and regular rhythm. S1 and S2 normal without gallop, murmur, click, rub or other extra sounds. Abdomen:  Bowel sounds positive,abdomen soft and non-tender without masses, organomegaly or hernias noted. Msk:  No deformity or scoliosis noted of thoracic or lumbar spine.   Extremities:  No clubbing, cyanosis, edema, or deformity noted with normal full range of motion of all joints.   Neurologic:  alert & oriented X3 and gait normal.   Skin:  Intact without suspicious lesions or rashes Psych:  Cognition and judgment appear intact. Alert and cooperative with normal attention span and concentration. No apparent delusions, illusions, hallucinations       Assessment & Plan:

## 2015-03-02 NOTE — Progress Notes (Signed)
Pre visit review using our clinic review tool, if applicable. No additional management support is needed unless otherwise documented below in the visit note. 

## 2015-03-02 NOTE — Assessment & Plan Note (Signed)
Well controlled on current rx. No changes made today. 

## 2015-03-02 NOTE — Assessment & Plan Note (Signed)
Reviewed preventive care protocols, scheduled due services, and updated immunizations Discussed nutrition, exercise, diet, and healthy lifestyle.  

## 2015-03-22 ENCOUNTER — Encounter: Payer: Self-pay | Admitting: Family Medicine

## 2015-04-24 ENCOUNTER — Other Ambulatory Visit: Payer: Self-pay | Admitting: Family Medicine

## 2015-07-17 ENCOUNTER — Other Ambulatory Visit: Payer: Self-pay | Admitting: Family Medicine

## 2015-07-26 ENCOUNTER — Encounter: Payer: Self-pay | Admitting: Family Medicine

## 2015-07-26 ENCOUNTER — Other Ambulatory Visit: Payer: Self-pay | Admitting: Family Medicine

## 2015-07-26 MED ORDER — VARENICLINE TARTRATE 0.5 MG X 11 & 1 MG X 42 PO MISC: ORAL | Status: DC

## 2015-08-07 ENCOUNTER — Encounter: Payer: Self-pay | Admitting: Family Medicine

## 2015-08-07 MED ORDER — VARENICLINE TARTRATE 0.5 MG X 11 & 1 MG X 42 PO MISC: ORAL | Status: DC

## 2015-08-28 ENCOUNTER — Other Ambulatory Visit: Payer: Self-pay | Admitting: Family Medicine

## 2015-09-07 ENCOUNTER — Other Ambulatory Visit: Payer: Self-pay | Admitting: Family Medicine

## 2015-09-07 DIAGNOSIS — Z1231 Encounter for screening mammogram for malignant neoplasm of breast: Secondary | ICD-10-CM

## 2015-09-19 ENCOUNTER — Ambulatory Visit
Admission: RE | Admit: 2015-09-19 | Discharge: 2015-09-19 | Disposition: A | Discharge status: Home / Self Care | Payer: Managed Care, Other (non HMO) | Source: Ambulatory Visit | Attending: Family Medicine | Admitting: Family Medicine

## 2015-09-19 DIAGNOSIS — Z1231 Encounter for screening mammogram for malignant neoplasm of breast: Secondary | ICD-10-CM | POA: Diagnosis present

## 2015-09-30 ENCOUNTER — Other Ambulatory Visit: Payer: Self-pay | Admitting: Family Medicine

## 2015-11-13 ENCOUNTER — Encounter: Payer: Self-pay | Admitting: Family Medicine

## 2015-11-13 MED ORDER — ATORVASTATIN CALCIUM 10 MG PO TABS: ORAL_TABLET | ORAL | Status: DC

## 2015-11-23 ENCOUNTER — Ambulatory Visit (INDEPENDENT_AMBULATORY_CARE_PROVIDER_SITE_OTHER): Payer: Managed Care, Other (non HMO) | Admitting: Family Medicine

## 2015-11-23 ENCOUNTER — Encounter: Payer: Self-pay | Admitting: Family Medicine

## 2015-11-23 VITALS — BP 128/84 | HR 84 | Temp 98.1°F | Wt 186.5 lb

## 2015-11-23 DIAGNOSIS — W57XXXA Bitten or stung by nonvenomous insect and other nonvenomous arthropods, initial encounter: Secondary | ICD-10-CM

## 2015-11-23 DIAGNOSIS — T148 Other injury of unspecified body region: Secondary | ICD-10-CM

## 2015-11-23 MED ORDER — DOXYCYCLINE HYCLATE 100 MG PO TABS: 100.0000 mg | ORAL_TABLET | Freq: Two times a day (BID) | ORAL | Status: DC

## 2015-11-23 NOTE — Patient Instructions (Signed)
This does not sound or look like tick borne illness - I think more likely you do have slight infection or cellulitis around tick bite. Treat with 7 day doxycycline course.  Avoid sun.

## 2015-11-23 NOTE — Progress Notes (Signed)
   BP 128/84 mmHg  Pulse 84  Temp(Src) 98.1 F (36.7 C) (Oral)  Wt 186 lb 8 oz (84.596 kg)   CC: tick bite  Subjective:    Patient ID: Jacqueline [name], female    DOB: 1951-01-22, 65 y.o.   MRN: [id number]  HPI: Jacqueline [name] is a 65 y.o. female presenting on 11/23/2015 for Tick bite   3 wks ago found tick bite on left left lateral lower abdomen/upper thigh. She did have redness develop around tick bite. She treated with alcohol. On Tuesday found 2nd tick at same site! Again red mark around tick.   Has felt well. No fevers, new rash, joint pains, headache or neck stiffness, abd pain, nausea.   She has had tick bites previously but never had red marks around bite.   Relevant past medical, surgical, family and social history reviewed and updated as indicated. Interim medical history since our last visit reviewed. Allergies and medications reviewed and updated. Current Outpatient Prescriptions on File Prior to Visit  Medication Sig  . atorvastatin (LIPITOR) 10 MG tablet TAKE 1 TABLET DAILY  . lisinopril (PRINIVIL,ZESTRIL) 10 MG tablet Take 1 tablet (10 mg total) by mouth daily.  . Multiple Vitamin (MULTIVITAMIN) tablet Take 1 tablet by mouth daily.     No current facility-administered medications on file prior to visit.    Review of Systems Per HPI unless specifically indicated in ROS section     Objective:    BP 128/84 mmHg  Pulse 84  Temp(Src) 98.1 F (36.7 C) (Oral)  Wt 186 lb 8 oz (84.596 kg)  Wt Readings from Last 3 Encounters:  11/23/15 186 lb 8 oz (84.596 kg)  03/02/15 188 lb (85.276 kg)  02/21/15 189 lb 8 oz (85.957 kg)    Physical Exam  Constitutional: She appears well-developed and well-nourished. No distress.  HENT:  Mouth/Throat: Oropharynx is clear and moist. No oropharyngeal exudate.  Skin: Skin is warm and dry. No rash noted. There is erythema.  2 tick bites L lateral upper thigh with ~5cm surrounding erythema and warmth No central clearing    Nursing note and vitals reviewed.      Assessment & Plan:   Problem List Items Addressed This Visit    Tick bite - Primary    Reassured no signs of tick borne illness or retained tick parts however she seems to have developed cellulitis around tick bite - treat with 7d doxycycline. Discussed taking with food and avoiding sun on abx. Pt agrees with plan.          Follow up plan: Return if symptoms worsen or fail to improve.  Ria Bush, MD

## 2015-11-23 NOTE — Assessment & Plan Note (Signed)
Reassured no signs of tick borne illness or retained tick parts however she seems to have developed cellulitis around tick bite - treat with 7d doxycycline. Discussed taking with food and avoiding sun on abx. Pt agrees with plan.

## 2015-11-23 NOTE — Progress Notes (Signed)
Pre visit review using our clinic review tool, if applicable. No additional management support is needed unless otherwise documented below in the visit note. 

## 2016-02-06 ENCOUNTER — Other Ambulatory Visit: Payer: Self-pay | Admitting: Family Medicine

## 2016-03-22 ENCOUNTER — Other Ambulatory Visit: Payer: Self-pay | Admitting: Family Medicine

## 2016-03-22 DIAGNOSIS — E785 Hyperlipidemia, unspecified: Secondary | ICD-10-CM

## 2016-03-22 DIAGNOSIS — Z01419 Encounter for gynecological examination (general) (routine) without abnormal findings: Secondary | ICD-10-CM

## 2016-03-22 DIAGNOSIS — I10 Essential (primary) hypertension: Secondary | ICD-10-CM

## 2016-03-26 ENCOUNTER — Other Ambulatory Visit (INDEPENDENT_AMBULATORY_CARE_PROVIDER_SITE_OTHER): Payer: Managed Care, Other (non HMO)

## 2016-03-26 DIAGNOSIS — Z0000 Encounter for general adult medical examination without abnormal findings: Secondary | ICD-10-CM | POA: Diagnosis not present

## 2016-03-26 DIAGNOSIS — E785 Hyperlipidemia, unspecified: Secondary | ICD-10-CM | POA: Diagnosis not present

## 2016-03-26 DIAGNOSIS — Z01419 Encounter for gynecological examination (general) (routine) without abnormal findings: Secondary | ICD-10-CM

## 2016-03-26 LAB — LIPID PANEL
Cholesterol: 137 mg/dL (ref 0–200)
HDL: 46.6 mg/dL (ref >39.00)
LDL Cholesterol: 71 mg/dL (ref 0–99)
NONHDL: 90.3
TRIGLYCERIDES: 97 mg/dL (ref 0.0–149.0)
Total CHOL/HDL Ratio: 3
VLDL: 19.4 mg/dL (ref 0.0–40.0)

## 2016-03-26 LAB — CBC WITH DIFFERENTIAL/PLATELET
Basophils Absolute: 0 10*3/uL (ref 0.0–0.1)
Basophils Relative: 0.6 % (ref 0.0–3.0)
EOS PCT: 4.3 % (ref 0.0–5.0)
Eosinophils Absolute: 0.3 10*3/uL (ref 0.0–0.7)
HCT: 41 % (ref 36.0–46.0)
Hemoglobin: 14.1 g/dL (ref 12.0–15.0)
LYMPHS ABS: 2.3 10*3/uL (ref 0.7–4.0)
Lymphocytes Relative: 31.7 % (ref 12.0–46.0)
MCHC: 34.4 g/dL (ref 30.0–36.0)
MCV: 91 fl (ref 78.0–100.0)
MONOS PCT: 10.2 % (ref 3.0–12.0)
Monocytes Absolute: 0.8 10*3/uL (ref 0.1–1.0)
NEUTROS ABS: 3.9 10*3/uL (ref 1.4–7.7)
NEUTROS PCT: 53.2 % (ref 43.0–77.0)
PLATELETS: 255 10*3/uL (ref 150.0–400.0)
RBC: 4.51 Mil/uL (ref 3.87–5.11)
RDW: 13.3 % (ref 11.5–15.5)
WBC: 7.3 10*3/uL (ref 4.0–10.5)

## 2016-03-26 LAB — COMPREHENSIVE METABOLIC PANEL
ALK PHOS: 49 U/L (ref 39–117)
ALT: 18 U/L (ref 0–35)
AST: 17 U/L (ref 0–37)
Albumin: 3.8 g/dL (ref 3.5–5.2)
BUN: 12 mg/dL (ref 6–23)
CALCIUM: 8.7 mg/dL (ref 8.4–10.5)
CO2: 29 mEq/L (ref 19–32)
Chloride: 108 mEq/L (ref 96–112)
Creatinine, Ser: 0.81 mg/dL (ref 0.40–1.20)
GFR: 75.5 mL/min (ref >60.00)
GLUCOSE: 88 mg/dL (ref 70–99)
POTASSIUM: 4.3 meq/L (ref 3.5–5.1)
Sodium: 141 mEq/L (ref 135–145)
TOTAL PROTEIN: 6.7 g/dL (ref 6.0–8.3)
Total Bilirubin: 0.7 mg/dL (ref 0.2–1.2)

## 2016-03-26 LAB — TSH: TSH: 1.15 u[IU]/mL (ref 0.35–4.50)

## 2016-04-02 ENCOUNTER — Ambulatory Visit (INDEPENDENT_AMBULATORY_CARE_PROVIDER_SITE_OTHER): Payer: Managed Care, Other (non HMO) | Admitting: Family Medicine

## 2016-04-02 ENCOUNTER — Ambulatory Visit (INDEPENDENT_AMBULATORY_CARE_PROVIDER_SITE_OTHER)
Admission: RE | Admit: 2016-04-02 | Discharge: 2016-04-02 | Disposition: A | Discharge status: Home / Self Care | Payer: Managed Care, Other (non HMO) | Source: Ambulatory Visit | Attending: Family Medicine | Admitting: Family Medicine

## 2016-04-02 ENCOUNTER — Encounter: Payer: Self-pay | Admitting: Family Medicine

## 2016-04-02 VITALS — BP 126/74 | HR 88 | Temp 98.2°F | Ht 64.5 in | Wt 189.0 lb

## 2016-04-02 DIAGNOSIS — E785 Hyperlipidemia, unspecified: Secondary | ICD-10-CM

## 2016-04-02 DIAGNOSIS — M25561 Pain in right knee: Secondary | ICD-10-CM | POA: Diagnosis not present

## 2016-04-02 DIAGNOSIS — Z0000 Encounter for general adult medical examination without abnormal findings: Secondary | ICD-10-CM | POA: Diagnosis not present

## 2016-04-02 DIAGNOSIS — Z01419 Encounter for gynecological examination (general) (routine) without abnormal findings: Secondary | ICD-10-CM

## 2016-04-02 DIAGNOSIS — I10 Essential (primary) hypertension: Secondary | ICD-10-CM

## 2016-04-02 NOTE — Progress Notes (Signed)
Subjective:    Patient ID: Jacqueline [name] [facility], female    DOB: Jul 23, 1950, 65 y.o.   MRN: [id number]  HPI  65 yo pleasant female with h/o HLD, HTN here for CPX and follow up of chronic medical conditions.    Remote h/o hysterectomy Colonoscopy 08/14/12 Olevia Perches)- 10 year recall. Mammogram 09/19/15 Zostavax 10/27/13 Tdap 11/27/09    HLD- well controlled on Lipitor 10 mg daily.   Denies myalgias. Lab Results  Component Value Date   CHOL 137 03/26/2016   HDL 46.60 03/26/2016   LDLCALC 71 03/26/2016   LDLDIRECT 149.5 12/25/2006   TRIG 97.0 03/26/2016   CHOLHDL 3 03/26/2016   Lab Results  Component Value Date   ALT 18 03/26/2016   AST 17 03/26/2016   ALKPHOS 49 03/26/2016   BILITOT 0.7 03/26/2016   HTN-  BP has been well controlled on Lisinopril 10 mg daily. Denies any HA, blurred vision, CP, or SOB.  Lab Results  Component Value Date   CREATININE 0.81 03/26/2016    Patient Active Problem List  Diagnosis  . HLD (hyperlipidemia)  . Obesity  . CIGARETTE SMOKER  . Essential hypertension  . Mitral valve disorder  . RENAL CALCULUS  . OSTEOPENIA  . PALPITATIONS  . Well woman exam  . Tick bite   Past Medical History:  Diagnosis Date  . Abnormal Pap smear of vagina   . Anxiety   . Calcium oxalate renal stones   . Hyperlipidemia   . MVP (mitral valve prolapse)   . Osteoporosis    Osteopenia  . Tobacco abuse    Past Surgical History:  Procedure Laterality Date  . BREAST BIOPSY Right 08/19/12  . extraction of renal stones    . TONSILLECTOMY AND ADENOIDECTOMY    . VAGINAL HYSTERECTOMY     class IV, ovaries intact   Social History  Substance Use Topics  . Smoking status: Former Smoker    Packs/day: 0.25    Years: 30.00    Types: Cigarettes  . Smokeless tobacco: Never Used  . Alcohol use 0.6 oz/week    1 Glasses of wine per week     Comment: occasional   Family History  Problem Relation Age of Onset  . Hypertension Mother   . Stroke Father 93  . Cancer  Maternal Aunt     breast and lung  . Heart disease Maternal Grandmother   . Heart disease Maternal Grandfather   . Hypertension Paternal Grandmother    No Known Allergies Current Outpatient Prescriptions on File Prior to Visit  Medication Sig Dispense Refill  . atorvastatin (LIPITOR) 10 MG tablet TAKE 1 TABLET DAILY 90 tablet 0  . lisinopril (PRINIVIL,ZESTRIL) 10 MG tablet Take 1 tablet (10 mg total) by mouth daily. COMPLETE PHYSICAL EXAM REQUIRED FOR ADDITIONAL REFILLS 90 tablet 0  . Multiple Vitamin (MULTIVITAMIN) tablet Take 1 tablet by mouth daily.       No current facility-administered medications on file prior to visit.    The PMH, PSH, Social History, Family History, Medications, and allergies have been reviewed in [facility], and have been updated if relevant.    Review of Systems  Constitutional: Negative.   HENT: Negative.   Eyes: Negative.   Respiratory: Negative.   Cardiovascular: Negative.   Gastrointestinal: Negative.   Endocrine: Negative.   Genitourinary: Negative.   Musculoskeletal: Negative.   Skin: Negative.   Allergic/Immunologic: Negative.   Neurological: Negative.   Hematological: Negative.   Psychiatric/Behavioral: Negative.   All other systems reviewed and are  negative.       Objective:   Physical Exam BP 126/74   Pulse 88   Temp 98.2 F (36.8 C) (Oral)   Ht 5' 4.5" (1.638 m)   Wt 189 lb (85.7 kg)   SpO2 98%   BMI 31.94 kg/m   Wt Readings from Last 3 Encounters:  04/02/16 189 lb (85.7 kg)  11/23/15 186 lb 8 oz (84.6 kg)  03/02/15 188 lb (85.3 kg)    General:  Well-developed,well-nourished,in no acute distress; alert,appropriate and cooperative throughout examination Head:  normocephalic and atraumatic.   Eyes:  vision grossly intact, pupils equal, pupils round, and pupils reactive to light.   Ears:  R ear normal and L ear normal.   Nose:  no external deformity.   Mouth:  good dentition.   Neck:  No deformities, masses, or tenderness  noted. Breasts:  No mass, nodules, thickening, tenderness, bulging, retraction, inflamation, nipple discharge or skin changes noted.   Lungs:  Normal respiratory effort, chest expands symmetrically. Lungs are clear to auscultation, no crackles or wheezes. Heart:  Normal rate and regular rhythm. S1 and S2 normal without gallop, murmur, click, rub or other extra sounds. Abdomen:  Bowel sounds positive,abdomen soft and non-tender without masses, organomegaly or hernias noted. Msk:  No deformity or scoliosis noted of thoracic or lumbar spine.   Extremities:  No clubbing, cyanosis, edema, or deformity noted with normal full range of motion of all joints.   Neurologic:  alert & oriented X3 and gait normal.   Skin:  Intact without suspicious lesions or rashes Psych:  Cognition and judgment appear intact. Alert and cooperative with normal attention span and concentration. No apparent delusions, illusions, hallucinations       Assessment & Plan:

## 2016-04-02 NOTE — Progress Notes (Signed)
SUBJECTIVE: Jacqueline [name] is a 65 y.o. female who complains of a right knee pain for past  1 month(s).  No know injury. Symptoms have been recurrent since that time. Prior history of related problems: no prior problems with this area in the past.  Current Outpatient Prescriptions on File Prior to Visit  Medication Sig Dispense Refill  . atorvastatin (LIPITOR) 10 MG tablet TAKE 1 TABLET DAILY 90 tablet 0  . lisinopril (PRINIVIL,ZESTRIL) 10 MG tablet Take 1 tablet (10 mg total) by mouth daily. COMPLETE PHYSICAL EXAM REQUIRED FOR ADDITIONAL REFILLS 90 tablet 0  . Multiple Vitamin (MULTIVITAMIN) tablet Take 1 tablet by mouth daily.       No current facility-administered medications on file prior to visit.     No Known Allergies  Past Medical History:  Diagnosis Date  . Abnormal Pap smear of vagina   . Anxiety   . Calcium oxalate renal stones   . Hyperlipidemia   . MVP (mitral valve prolapse)   . Osteoporosis    Osteopenia  . Tobacco abuse     Past Surgical History:  Procedure Laterality Date  . BREAST BIOPSY Right 08/19/12  . extraction of renal stones    . TONSILLECTOMY AND ADENOIDECTOMY    . VAGINAL HYSTERECTOMY     class IV, ovaries intact    Family History  Problem Relation Age of Onset  . Hypertension Mother   . Stroke Father 15  . Cancer Maternal Aunt     breast and lung  . Heart disease Maternal Grandmother   . Heart disease Maternal Grandfather   . Hypertension Paternal Grandmother     Social History   Social History  . Marital status: Married    Spouse name: N/A  . Number of children: 2  . 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 Smoking status: Former Smoker    Packs/day: 0.25    Years: 30.00    Types: Cigarettes  . Smokeless tobacco: Never Used  . Alcohol use 0.6 oz/week    1 Glasses of wine per week     Comment: occasional  . Drug use: No  . Sexual activity: Not on file    Other Topics Concern  . Not on file   Social History Narrative  . No narrative on file   The PMH, PSH, Social History, Family History, Medications, and allergies have been reviewed in [facility], and have been updated if relevant.  OBJECTIVE: BP 126/74   Pulse 88   Temp 98.2 F (36.8 C) (Oral)   Ht 5' 4.5" (1.638 m)   Wt 189 lb (85.7 kg)   SpO2 98%   BMI 31.94 kg/m   Vital signs as noted above. Appearance: alert, well appearing, and in no distress and oriented to person, place, and time. Knee exam: effusion, exam limited by acuity of pain, negative drawer sign. X-ray: ordered, but results not yet available.  ASSESSMENT: Knee underlying chronic DJD likely  PLAN: rest the injured area as much as practical See orders for this visit as documented in the electronic medical record.

## 2016-04-02 NOTE — Progress Notes (Signed)
Pre visit review using our clinic review tool, if applicable. No additional management support is needed unless otherwise documented below in the visit note. 

## 2016-04-02 NOTE — Patient Instructions (Signed)
Great to see you.  I will call you with your xray results. 

## 2016-04-10 ENCOUNTER — Encounter: Payer: Self-pay | Admitting: Family Medicine

## 2016-04-10 ENCOUNTER — Ambulatory Visit (INDEPENDENT_AMBULATORY_CARE_PROVIDER_SITE_OTHER): Payer: Managed Care, Other (non HMO) | Admitting: Family Medicine

## 2016-04-10 VITALS — BP 110/64 | HR 80 | Temp 98.7°F | Ht 64.5 in | Wt 189.5 lb

## 2016-04-10 DIAGNOSIS — M25561 Pain in right knee: Secondary | ICD-10-CM

## 2016-04-10 NOTE — Patient Instructions (Signed)
Balance Exercises:  While brushing teeth, practice standing on 1 foot. Do for as long as you can, ideally 30 seconds to 1 minute each foot.     Treadmill: lower extremity rehab  Forward Walking: Go light 2 mins, easy about 2-3 mph: At Sideways Left: 2 mins, 0.6 - 0.8 mph Sideways Right: 2 mins, 0.6 - 0.8 mph Backwards, 2 mins, 1.8 - 2.2 mph Repeat, several cycles Goal is 30 minute  Start at a 3 degree incline - can slightly lower if necessary When able, increase to 3.5, then 4, then 4.5 (After you comfortably can do 30 minutes)

## 2016-04-10 NOTE — Progress Notes (Signed)
Dr. Frederico Hamman T. Lacey Wallman, MD, [facility] [place], [address] Phone: [phone] Fax: [phone]  04/10/2016  Patient: Jacqueline [name] [facility], MRN: [id number], DOB: Mar 27, 1951, 65 y.o.  Primary Physician:  Arnette Norris, MD   Chief Complaint  Patient presents with  . Knee Pain    Right-comes and goes x 1 month   Subjective:   Zoeya Styers is a 65 y.o. very pleasant female patient who presents with the following:  A couple of weeks ago, flared up after walking on sand. She's been having some pain intermittently for the last 4 weeks. She does not have any symptoms at all right now. She has not had any mechanical symptoms. She has not had any swelling. No prior history of knee injury or pain. No history of fracture.  She is a very functional and active at age 47.  Past Medical History, Surgical History, Social History, Family History, Problem List, Medications, and Allergies have been reviewed and updated if relevant.  Patient Active Problem List   Diagnosis Date Noted  . Right knee pain 04/02/2016  . Tick bite 11/23/2015  . Well woman exam 03/02/2015  . Essential hypertension 01/14/2008  . Obesity 12/25/2006  . CIGARETTE SMOKER 12/23/2006  . Mitral valve disorder 12/23/2006  . RENAL CALCULUS 12/23/2006  . HLD (hyperlipidemia) 11/11/2006  . OSTEOPENIA 11/11/2006  . PALPITATIONS 11/11/2006    Past Medical History:  Diagnosis Date  . Abnormal Pap smear of vagina   . Anxiety   . Calcium oxalate renal stones   . Hyperlipidemia   . MVP (mitral valve prolapse)   . Osteoporosis    Osteopenia  . Tobacco abuse     Past Surgical History:  Procedure Laterality Date  . BREAST BIOPSY Right 08/19/12  . extraction of renal stones    . TONSILLECTOMY AND ADENOIDECTOMY    . VAGINAL HYSTERECTOMY     class IV, ovaries intact    Social History   Social History  . Marital status: Married    Spouse name: N/A  .  Number of children: 2  . 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 Smoking status: Former Smoker    Packs/day: 0.25    Years: 30.00    Types: Cigarettes  . Smokeless tobacco: Never Used  . Alcohol use 0.6 oz/week    1 Glasses of wine per week     Comment: occasional  . Drug use: No  . Sexual activity: Not on file   Other Topics Concern  . Not on file   Social History Narrative  . No narrative on file    Family History  Problem Relation Age of Onset  . Hypertension Mother   . Stroke Father 56  . Cancer Maternal Aunt     breast and lung  . Heart disease Maternal Grandmother   . Heart disease Maternal Grandfather   . Hypertension Paternal Grandmother     No Known Allergies  Medication list reviewed and updated in full in [place].  GEN: No fevers, chills. Nontoxic. Primarily MSK c/o today. MSK: Detailed in the HPI GI: tolerating PO intake without difficulty Neuro: No numbness, parasthesias, or tingling associated. Otherwise the pertinent positives of the ROS are noted above.   Objective:   BP 110/64   Pulse 80   Temp 98.7 F (37.1 C) (Oral)   Ht 5' 4.5" (  1.638 m)   Wt 189 lb 8 oz (86 kg)   BMI 32.03 kg/m    GEN: WDWN, NAD, Non-toxic, Alert & Oriented x 3 HEENT: Atraumatic, Normocephalic.  Ears and Nose: No external deformity. EXTR: No clubbing/cyanosis/edema NEURO: Normal gait.  PSYCH: Normally interactive. Conversant. Not depressed or anxious appearing.  Calm demeanor.   Knee:  R Gait: Normal heel toe pattern ROM: 0-130 Effusion: neg Echymosis or edema: none Patellar tendon NT Painful PLICA: neg Patellar grind: negative Medial and lateral patellar facet loading: negative medial and lateral joint lines:NT Mcmurray's neg Flexion-pinch neg Varus and valgus stress: stable Lachman: neg Ant and Post drawer: neg Hip abduction, IR, ER: WNL Hip flexion str: 5/5 Hip abd:  5/5 Quad: 5/5 VMO atrophy:No Hamstring concentric and eccentric: 5/5   Radiology: Dg Knee Complete 4 Views Right  Result Date: 04/02/2016 CLINICAL DATA:  Right knee pain for 3 weeks.  Swelling. EXAM: RIGHT KNEE - COMPLETE 4+ VIEW COMPARISON:  None. FINDINGS: No evidence of fracture, dislocation, or joint effusion. No evidence of arthropathy or other focal bone abnormality. Soft tissues are unremarkable. IMPRESSION: No fracture, dislocation or arthropathy of the right knee. Electronically Signed   By: Ulyses Jarred M.D.   On: 04/02/2016 13:32    Assessment and Plan:   Acute pain of right knee   >25 minutes spent in face to face time with patient, >50% spent in counselling or coordination of care  Clinically and on exam, the patient's knee is very stable with no sign of internal derangement. Excellent joint space on radiograph. I reassured the patient.  I'm suspicious that she is having some pain after her knee gets tired. Her proprioception is not good, and likely would be worsened after walking or walking on uneven surfaces.  I gave her some additional rehabilitation and proprioception to try to work on her home.  Follow-up: Return in about 6 weeks (around 05/22/2016).  Patient Instructions  Balance Exercises:  While brushing teeth, practice standing on 1 foot. Do for as long as you can, ideally 30 seconds to 1 minute each foot.     Treadmill: lower extremity rehab  Forward Walking: Go light 2 mins, easy about 2-3 mph: At Sideways Left: 2 mins, 0.6 - 0.8 mph Sideways Right: 2 mins, 0.6 - 0.8 mph Backwards, 2 mins, 1.8 - 2.2 mph Repeat, several cycles Goal is 30 minute  Start at a 3 degree incline - can slightly lower if necessary When able, increase to 3.5, then 4, then 4.5 (After you comfortably can do 30 minutes)     Signed,  Dekari Bures T. Shantanique Hodo, MD   Patient's Medications  New Prescriptions   No medications on file  Previous Medications   ATORVASTATIN (LIPITOR)  10 MG TABLET    TAKE 1 TABLET DAILY   LISINOPRIL (PRINIVIL,ZESTRIL) 10 MG TABLET    Take 1 tablet (10 mg total) by mouth daily. COMPLETE PHYSICAL EXAM REQUIRED FOR ADDITIONAL REFILLS   MULTIPLE VITAMIN (MULTIVITAMIN) TABLET    Take 1 tablet by mouth daily.    Modified Medications   No medications on file  Discontinued Medications   No medications on file

## 2016-04-10 NOTE — Progress Notes (Signed)
Pre visit review using our clinic review tool, if applicable. No additional management support is needed unless otherwise documented below in the visit note. 

## 2016-04-26 ENCOUNTER — Other Ambulatory Visit: Payer: Self-pay | Admitting: Family Medicine

## 2016-05-12 ENCOUNTER — Other Ambulatory Visit: Payer: Self-pay | Admitting: Family Medicine

## 2016-05-22 ENCOUNTER — Ambulatory Visit: Payer: Managed Care, Other (non HMO) | Admitting: Family Medicine

## 2016-08-06 ENCOUNTER — Other Ambulatory Visit: Payer: Self-pay | Admitting: *Deleted

## 2016-08-06 MED ORDER — ATORVASTATIN CALCIUM 10 MG PO TABS: 10.0000 mg | ORAL_TABLET | Freq: Every day | ORAL | 1 refills | Status: DC

## 2016-08-14 ENCOUNTER — Other Ambulatory Visit: Payer: Self-pay | Admitting: Family Medicine

## 2016-08-14 DIAGNOSIS — Z1231 Encounter for screening mammogram for malignant neoplasm of breast: Secondary | ICD-10-CM

## 2016-09-19 ENCOUNTER — Ambulatory Visit
Admission: RE | Admit: 2016-09-19 | Discharge: 2016-09-19 | Disposition: A | Discharge status: Home / Self Care | Payer: Managed Care, Other (non HMO) | Source: Ambulatory Visit | Attending: Family Medicine | Admitting: Family Medicine

## 2016-09-19 DIAGNOSIS — Z1231 Encounter for screening mammogram for malignant neoplasm of breast: Secondary | ICD-10-CM | POA: Diagnosis not present

## 2016-10-14 ENCOUNTER — Encounter: Payer: Self-pay | Admitting: Internal Medicine

## 2016-10-14 ENCOUNTER — Ambulatory Visit (INDEPENDENT_AMBULATORY_CARE_PROVIDER_SITE_OTHER): Payer: 59 | Admitting: Internal Medicine

## 2016-10-14 VITALS — BP 126/84 | HR 72 | Temp 98.2°F | Wt 199.0 lb

## 2016-10-14 DIAGNOSIS — M25561 Pain in right knee: Secondary | ICD-10-CM

## 2016-10-14 DIAGNOSIS — R635 Abnormal weight gain: Secondary | ICD-10-CM

## 2016-10-14 DIAGNOSIS — G8929 Other chronic pain: Secondary | ICD-10-CM | POA: Diagnosis not present

## 2016-10-14 MED ORDER — PREDNISONE 10 MG PO TABS: ORAL_TABLET | ORAL | 0 refills | Status: DC

## 2016-10-14 NOTE — Patient Instructions (Signed)
Knee Exercises Ask your health care provider which exercises are safe for you. Do exercises exactly as told by your health care provider and adjust them as directed. It is normal to feel mild stretching, pulling, tightness, or discomfort as you do these exercises, but you should stop right away if you feel sudden pain or your pain gets worse.Do not begin these exercises until told by your health care provider. STRETCHING AND RANGE OF MOTION EXERCISES  These exercises warm up your muscles and joints and improve the movement and flexibility of your knee. These exercises also help to relieve pain, numbness, and tingling. Exercise A: Knee Extension, Prone  1. Lie on your abdomen on a bed. 2. Place your left / right knee just beyond the edge of the surface so your knee is not on the bed. You can put a towel under your left / right thigh just above your knee for comfort. 3. Relax your leg muscles and allow gravity to straighten your knee. You should feel a stretch behind your left / right knee. 4. Hold this position for __________ seconds. 5. Scoot up so your knee is supported between repetitions. Repeat __________ times. Complete this stretch __________ times a day. Exercise B: Knee Flexion, Active   1. Lie on your back with both knees straight. If this causes back discomfort, bend your left / right knee so your foot is flat on the floor. 2. Slowly slide your left / right heel back toward your buttocks until you feel a gentle stretch in the front of your knee or thigh. 3. Hold this position for __________ seconds. 4. Slowly slide your left / right heel back to the starting position. Repeat __________ times. Complete this exercise __________ times a day. Exercise C: Quadriceps, Prone   1. Lie on your abdomen on a firm surface, such as a bed or padded floor. 2. Bend your left / right knee and hold your ankle. If you cannot reach your ankle or pant leg, loop a belt around your foot and grab the belt  instead. 3. Gently pull your heel toward your buttocks. Your knee should not slide out to the side. You should feel a stretch in the front of your thigh and knee. 4. Hold this position for __________ seconds. Repeat __________ times. Complete this stretch __________ times a day. Exercise D: Hamstring, Supine  1. Lie on your back. 2. Loop a belt or towel over the ball of your left / right foot. The ball of your foot is on the walking surface, right under your toes. 3. Straighten your left / right knee and slowly pull on the belt to raise your leg until you feel a gentle stretch behind your knee.  Do not let your left / right knee bend while you do this.  Keep your other leg flat on the floor. 4. Hold this position for __________ seconds. Repeat __________ times. Complete this stretch __________ times a day. STRENGTHENING EXERCISES  These exercises build strength and endurance in your knee. Endurance is the ability to use your muscles for a long time, even after they get tired. Exercise E: Quadriceps, Isometric   1. Lie on your back with your left / right leg extended and your other knee bent. Put a rolled towel or small pillow under your knee if told by your health care provider. 2. Slowly tense the muscles in the front of your left / right thigh. You should see your kneecap slide up toward your hip or see  increased dimpling just above the knee. This motion will push the back of the knee toward the floor. 3. For __________ seconds, keep the muscle as tight as you can without increasing your pain. 4. Relax the muscles slowly and completely. Repeat __________ times. Complete this exercise __________ times a day. Exercise F: Straight Leg Raises - Quadriceps  1. Lie on your back with your left / right leg extended and your other knee bent. 2. Tense the muscles in the front of your left / right thigh. You should see your kneecap slide up or see increased dimpling just above the knee. Your thigh  may even shake a bit. 3. Keep these muscles tight as you raise your leg 4-6 inches (10-15 cm) off the floor. Do not let your knee bend. 4. Hold this position for __________ seconds. 5. Keep these muscles tense as you lower your leg. 6. Relax your muscles slowly and completely after each repetition. Repeat __________ times. Complete this exercise __________ times a day. Exercise G: Hamstring, Isometric  1. Lie on your back on a firm surface. 2. Bend your left / right knee approximately __________ degrees. 3. Dig your left / right heel into the surface as if you are trying to pull it toward your buttocks. Tighten the muscles in the back of your thighs to dig as hard as you can without increasing any pain. 4. Hold this position for __________ seconds. 5. Release the tension gradually and allow your muscles to relax completely for __________ seconds after each repetition. Repeat __________ times. Complete this exercise __________ times a day. Exercise H: Hamstring Curls   If told by your health care provider, do this exercise while wearing ankle weights. Begin with __________ weights. Then increase the weight by 1 lb (0.5 kg) increments. Do not wear ankle weights that are more than __________. 1. Lie on your abdomen with your legs straight. 2. Bend your left / right knee as far as you can without feeling pain. Keep your hips flat against the floor. 3. Hold this position for __________ seconds. 4. Slowly lower your leg to the starting position. Repeat __________ times. Complete this exercise __________ times a day. Exercise I: Squats (Quadriceps)  1. Stand in front of a table, with your feet and knees pointing straight ahead. You may rest your hands on the table for balance but not for support. 2. Slowly bend your knees and lower your hips like you are going to sit in a chair.  Keep your weight over your heels, not over your toes.  Keep your lower legs upright so they are parallel with the  table legs.  Do not let your hips go lower than your knees.  Do not bend lower than told by your health care provider.  If your knee pain increases, do not bend as low. 3. Hold the squat position for __________ seconds. 4. Slowly push with your legs to return to standing. Do not use your hands to pull yourself to standing. Repeat __________ times. Complete this exercise __________ times a day. Exercise J: Wall Slides (Quadriceps)   1. Lean your back against a smooth wall or door while you walk your feet out 18-24 inches (46-61 cm) from it. 2. Place your feet hip-width apart. 3. Slowly slide down the wall or door until your knees Repeat __________ times. Complete this exercise __________ times a day. 4. Exercise K: Straight Leg Raises - Hip Abductors  1. Lie on your side with your left / right leg   in the top position. Lie so your head, shoulder, knee, and hip line up. You may bend your bottom knee to help you keep your balance. 2. Roll your hips slightly forward so your hips are stacked directly over each other and your left / right knee is facing forward. 3. Leading with your heel, lift your top leg 4-6 inches (10-15 cm). You should feel the muscles in your outer hip lifting.  Do not let your foot drift forward.  Do not let your knee roll toward the ceiling. 4. Hold this position for __________ seconds. 5. Slowly return your leg to the starting position. 6. Let your muscles relax completely after each repetition. Repeat __________ times. Complete this exercise __________ times a day. Exercise L: Straight Leg Raises - Hip Extensors  1. Lie on your abdomen on a firm surface. You can put a pillow under your hips if that is more comfortable. 2. Tense the muscles in your buttocks and lift your left / right leg about 4-6 inches (10-15 cm). Keep your knee straight as you lift your leg. 3. Hold this position for __________ seconds. 4. Slowly lower your leg to the starting position. 5. Let  your leg relax completely after each repetition. Repeat __________ times. Complete this exercise __________ times a day. This information is not intended to replace advice given to you by your health care provider. Make sure you discuss any questions you have with your health care provider. Document Released: 05/08/2005 Document Revised: 03/18/2016 Document Reviewed: 04/30/2015 Elsevier Interactive Patient Education  2017 [employer]  

## 2016-10-14 NOTE — Progress Notes (Signed)
Subjective:    Patient ID: Jacqueline [name] [facility], female    DOB: 1951-06-05, 66 y.o.   MRN: [id number]  HPI  Pt presents to the clinic today with c/o right knee pain. She reports she woke up this morning with the pain. The pain is located on the inner side of her knee and underneath her knee. She describes the pain as throbbing. The pain does radiate into her lower leg. She has noticed some associated swelling. She is having trouble bending it, and having trouble driving. She has gained 10 lbs in the last 6 months which is likely a contributing factor. She has been trying the exercise that were given to her by Dr. Lorelei Pont 04/2016, used a brace, ice and Ibuprofen with minimal relief. She had an xray of her right knee 03/2016 which was normal.   Review of Systems      Past Medical History:  Diagnosis Date  . Abnormal Pap smear of vagina   . Anxiety   . Calcium oxalate renal stones   . Hyperlipidemia   . MVP (mitral valve prolapse)   . Osteoporosis    Osteopenia  . Tobacco abuse     Current Outpatient Prescriptions  Medication Sig Dispense Refill  . atorvastatin (LIPITOR) 10 MG tablet Take 1 tablet (10 mg total) by mouth daily. 90 tablet 1  . lisinopril (PRINIVIL,ZESTRIL) 10 MG tablet Take 1 tablet (10 mg total) by mouth daily. 90 tablet 2  . Multiple Vitamin (MULTIVITAMIN) tablet Take 1 tablet by mouth daily.       No current facility-administered medications for this visit.     No Known Allergies  Family History  Problem Relation Age of Onset  . Hypertension Mother   . Stroke Father 88  . Heart disease Maternal Grandmother   . Heart disease Maternal Grandfather   . Hypertension Paternal Grandmother   . Cancer Maternal Aunt     breast and lung    Social History   Social History  . Marital status: Married    Spouse name: N/A  . Number of children: 2  . 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 Smoking  status: Former Smoker    Packs/day: 0.25    Years: 30.00    Types: Cigarettes  . Smokeless tobacco: Never Used  . Alcohol use 0.6 oz/week    1 Glasses of wine per week     Comment: occasional  . Drug use: No  . Sexual activity: Not on file   Other Topics Concern  . Not on file   Social History Narrative  . No narrative on file     Constitutional: Denies fever, malaise, fatigue, headache or abrupt weight changes.  Musculoskeletal: Pt reports right knee pain and swelling. Denies decrease in range of motion, difficulty with gait, muscle pain.  Skin: Denies redness, rashes, lesions or ulcercations.    No other specific complaints in a complete review of systems (except as listed in HPI above).  Objective:   Physical Exam  BP 126/84   Pulse 72   Temp 98.2 F (36.8 C) (Oral)   Wt 199 lb (90.3 kg)   SpO2 98%   BMI 33.63 kg/m  Wt Readings from Last 3 Encounters:  10/14/16 199 lb (90.3 kg)  04/10/16 189 lb 8 oz (86 kg)  04/02/16 189 lb (85.7 kg)    General: Appears her stated age, obese in NAD. Skin: No  redness or warmth noted of right knee. Musculoskeletal:  Normal extension. Decreased flexion of the knee. No pain with palpation of the patellar tendons, bursa or joint lines. Pain with McMurray. Negative Lachman's. She is limping when she walks. She is able to walk on toes and heels.  Neurological: Sensation intact to BLE.   BMET    Component Value Date/Time   NA 141 03/26/2016 0845   K 4.3 03/26/2016 0845   CL 108 03/26/2016 0845   CO2 29 03/26/2016 0845   GLUCOSE 88 03/26/2016 0845   BUN 12 03/26/2016 0845   CREATININE 0.81 03/26/2016 0845   CALCIUM 8.7 03/26/2016 0845   GFRNONAA 73.93 11/21/2009 0915   GFRAA 84 03/11/2007 1057    Lipid Panel     Component Value Date/Time   CHOL 137 03/26/2016 0845   TRIG 97.0 03/26/2016 0845   HDL 46.60 03/26/2016 0845   CHOLHDL 3 03/26/2016 0845   VLDL 19.4 03/26/2016 0845   LDLCALC 71 03/26/2016 0845    CBC      Component Value Date/Time   WBC 7.3 03/26/2016 0845   RBC 4.51 03/26/2016 0845   HGB 14.1 03/26/2016 0845   HCT 41.0 03/26/2016 0845   PLT 255.0 03/26/2016 0845   MCV 91.0 03/26/2016 0845   MCHC 34.4 03/26/2016 0845   RDW 13.3 03/26/2016 0845   LYMPHSABS 2.3 03/26/2016 0845   MONOABS 0.8 03/26/2016 0845   EOSABS 0.3 03/26/2016 0845   BASOSABS 0.0 03/26/2016 0845    Hgb A1C No results found for: HGBA1C          Assessment & Plan:   Persistent Right Knee Pain:  Stop Ibuprofen eRx for Pred Taper x 9 days Continue rest, compression and ice She declines PT at this time, will consider again if Prednisone doesn't work If worse, consider MRI  Advised her to follow up with Dr. Lorelei Pont at any time if knee pain persist or worsens Kavina Cantave, NP

## 2016-10-15 ENCOUNTER — Encounter: Payer: Self-pay | Admitting: Internal Medicine

## 2016-10-24 ENCOUNTER — Other Ambulatory Visit: Payer: Self-pay | Admitting: Family Medicine

## 2016-12-16 ENCOUNTER — Encounter: Payer: Self-pay | Admitting: Internal Medicine

## 2016-12-17 ENCOUNTER — Ambulatory Visit (INDEPENDENT_AMBULATORY_CARE_PROVIDER_SITE_OTHER): Payer: 59 | Admitting: Internal Medicine

## 2016-12-17 ENCOUNTER — Encounter: Payer: Self-pay | Admitting: Internal Medicine

## 2016-12-17 VITALS — BP 126/80 | HR 78 | Temp 98.2°F | Wt 199.0 lb

## 2016-12-17 DIAGNOSIS — M25561 Pain in right knee: Secondary | ICD-10-CM

## 2016-12-17 DIAGNOSIS — G8929 Other chronic pain: Secondary | ICD-10-CM | POA: Diagnosis not present

## 2016-12-17 MED ORDER — PREDNISONE 10 MG PO TABS: ORAL_TABLET | ORAL | 0 refills | Status: DC

## 2016-12-17 NOTE — Progress Notes (Signed)
Subjective:    Patient ID: Jacqueline [name] [facility], female    DOB: 06/23/1951, 66 y.o.   MRN: [id number]  HPI  Pt presents to the clinic today to follow up right knee pain. This is a recurrent issue for her but she reports this episode started 4-5 days ago. The pain is located on the inner portion of her knee, under the kneecap and behind her knee. She describes the pain as achy and throbbing. The pain radiates into her lower leg. She reports associated swelling but denies numbness and tingling. Pain is worse with bending. She denies any injury to the area. She has tried knee exercises, ice, Ibuprofen. Xray of the knee from 03/2016 did not show any acute findings. She was last seen for this 10/2016, treated with Prednisone, with good relief.  Review of Systems      Past Medical History:  Diagnosis Date  . Abnormal Pap smear of vagina   . Anxiety   . Calcium oxalate renal stones   . Hyperlipidemia   . MVP (mitral valve prolapse)   . Osteoporosis    Osteopenia  . Tobacco abuse     Current Outpatient Prescriptions  Medication Sig Dispense Refill  . atorvastatin (LIPITOR) 10 MG tablet Take 1 tablet (10 mg total) by mouth daily. 90 tablet 1  . atorvastatin (LIPITOR) 10 MG tablet TAKE 1 TABLET BY MOUTH DAILY 90 tablet 1  . lisinopril (PRINIVIL,ZESTRIL) 10 MG tablet Take 1 tablet (10 mg total) by mouth daily. 90 tablet 2  . Multiple Vitamin (MULTIVITAMIN) tablet Take 1 tablet by mouth daily.      . predniSONE (DELTASONE) 10 MG tablet Take 3 tabs on days 1-3, take 2 tabs on days 4-6, take 1 tab on days 7-9 18 tablet 0   No current facility-administered medications for this visit.     No Known Allergies  Family History  Problem Relation Age of Onset  . Hypertension Mother   . Stroke Father 48  . Heart disease Maternal Grandmother   . Heart disease Maternal Grandfather   . Hypertension Paternal Grandmother   . Cancer Maternal Aunt        breast and lung    Social History   Social  History  . Marital status: Married    Spouse name: N/A  . Number of children: 2  . 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 Smoking status: Former Smoker    Packs/day: 0.25    Years: 30.00    Types: Cigarettes  . Smokeless tobacco: Never Used  . Alcohol use 0.6 oz/week    1 Glasses of wine per week     Comment: occasional  . Drug use: No  . Sexual activity: Not on file   Other Topics Concern  . Not on file   Social History Narrative  . No narrative on file     Constitutional: Denies fever, malaise, fatigue, headache or abrupt weight changes.  Musculoskeletal: Pt reports knee pain. Denies decrease in range of motion, difficulty with gait, muscle pain.    No other specific complaints in a complete review of systems (except as listed in HPI above).  Objective:   Physical Exam  BP 126/80   Pulse 78   Temp 98.2 F (36.8 C) (Oral)   Wt 199 lb (90.3 kg)   SpO2 98%   BMI 33.63 kg/m  Wt Readings from Last 3 Encounters:  12/17/16  199 lb (90.3 kg)  10/14/16 199 lb (90.3 kg)  04/10/16 189 lb 8 oz (86 kg)    General: Appears her stated age, well developed, well nourished in NAD. Musculoskeletal: Normal extension. Pain with flexion of the right knee. No joint swelling noted. Pain with McMurry. Negative Lachman's. No Baker's Cyst noted. Gait slow but steady.  BMET    Component Value Date/Time   NA 141 03/26/2016 0845   K 4.3 03/26/2016 0845   CL 108 03/26/2016 0845   CO2 29 03/26/2016 0845   GLUCOSE 88 03/26/2016 0845   BUN 12 03/26/2016 0845   CREATININE 0.81 03/26/2016 0845   CALCIUM 8.7 03/26/2016 0845   GFRNONAA 73.93 11/21/2009 0915   GFRAA 84 03/11/2007 1057    Lipid Panel     Component Value Date/Time   CHOL 137 03/26/2016 0845   TRIG 97.0 03/26/2016 0845   HDL 46.60 03/26/2016 0845   CHOLHDL 3 03/26/2016 0845   VLDL 19.4 03/26/2016 0845   LDLCALC 71 03/26/2016 0845    CBC      Component Value Date/Time   WBC 7.3 03/26/2016 0845   RBC 4.51 03/26/2016 0845   HGB 14.1 03/26/2016 0845   HCT 41.0 03/26/2016 0845   PLT 255.0 03/26/2016 0845   MCV 91.0 03/26/2016 0845   MCHC 34.4 03/26/2016 0845   RDW 13.3 03/26/2016 0845   LYMPHSABS 2.3 03/26/2016 0845   MONOABS 0.8 03/26/2016 0845   EOSABS 0.3 03/26/2016 0845   BASOSABS 0.0 03/26/2016 0845    Hgb A1C No results found for: HGBA1C          Assessment & Plan:   Chronic Right Knee Pain:  Advised her she is going to have to have further workup at some point, PT vs MRI. She reports she is leaving for the beach this week and so she can't schedule that now eRx for Pred Taper x 6 days Continue ice Knee exercises given  RTC as needed or if symptoms persist or worsen BAITY, REGINA, NP

## 2016-12-17 NOTE — Patient Instructions (Signed)

## 2017-01-13 ENCOUNTER — Encounter: Payer: Self-pay | Admitting: Family Medicine

## 2017-01-14 ENCOUNTER — Other Ambulatory Visit: Payer: Self-pay

## 2017-01-14 MED ORDER — LISINOPRIL 10 MG PO TABS: 10.0000 mg | ORAL_TABLET | Freq: Every day | ORAL | 0 refills | Status: DC

## 2017-04-12 ENCOUNTER — Other Ambulatory Visit: Payer: Self-pay | Admitting: Family Medicine

## 2017-05-07 ENCOUNTER — Telehealth: Payer: Self-pay | Admitting: Family Medicine

## 2017-05-07 MED ORDER — LISINOPRIL 10 MG PO TABS: 10.0000 mg | ORAL_TABLET | Freq: Every day | ORAL | 0 refills | Status: DC

## 2017-05-07 NOTE — Telephone Encounter (Signed)
Copied from [place] [phone]. Topic: Quick Communication - See Telephone Encounter >> May 07, 2017  2:34 PM Arletha Grippe wrote: CRM for notification. See Telephone encounter for:  05/07/17. Pt would like to have lisinopril (PRINIVIL,ZESTRIL) 10 MG tablet refilled.  Pt uses [facility]  cb for pt is [phone]

## 2017-05-07 NOTE — Telephone Encounter (Signed)
Rx refill

## 2017-05-07 NOTE — Telephone Encounter (Signed)
Faxed in only #15 for Lisinopril with note (again) needs OV for further fills/thx dmf

## 2017-05-12 ENCOUNTER — Other Ambulatory Visit: Payer: Self-pay | Admitting: Family Medicine

## 2017-05-12 NOTE — Telephone Encounter (Signed)
Refill was faxed in on 10.31.18 with note for pt to sched appt/thx dmf

## 2017-05-26 ENCOUNTER — Other Ambulatory Visit: Payer: Self-pay | Admitting: Internal Medicine

## 2017-06-08 ENCOUNTER — Other Ambulatory Visit: Payer: Self-pay | Admitting: Internal Medicine

## 2017-06-10 MED ORDER — LISINOPRIL 10 MG PO TABS: 10.0000 mg | ORAL_TABLET | Freq: Every day | ORAL | 0 refills | Status: DC

## 2017-06-10 NOTE — Telephone Encounter (Signed)
Copied from [place] [phone]. Topic: Quick Communication - See Telephone Encounter >> Jun 10, 2017  2:18 PM Ether Griffins B wrote: CRM for notification. See Telephone encounter for:  Pt needing refill on lisinopril. Pt has set up an appt to transfer care to baity 07/10/17 06/10/17.

## 2017-06-10 NOTE — Telephone Encounter (Signed)
I asked R Baity NP if could refill lisinopril # 30 until new pt transfer on 07/10/17; Avie Echevaria NP OKed refill # 30. I spoke with pt and she request rx to [facility]. Refill done as requested.

## 2017-06-18 NOTE — Telephone Encounter (Signed)
Spoken to patient. We have on her chart that [facility]. I have asked [facility] to [facility]. Patient verbalized understanding.

## 2017-06-18 NOTE — Telephone Encounter (Signed)
Refill status? Patient only has 1 more pill

## 2017-06-21 ENCOUNTER — Other Ambulatory Visit: Payer: Self-pay | Admitting: Family Medicine

## 2017-07-10 ENCOUNTER — Ambulatory Visit (INDEPENDENT_AMBULATORY_CARE_PROVIDER_SITE_OTHER): Payer: 59 | Admitting: Internal Medicine

## 2017-07-10 ENCOUNTER — Encounter: Payer: Self-pay | Admitting: Internal Medicine

## 2017-07-10 DIAGNOSIS — M8580 Other specified disorders of bone density and structure, unspecified site: Secondary | ICD-10-CM

## 2017-07-10 DIAGNOSIS — E7800 Pure hypercholesterolemia, unspecified: Secondary | ICD-10-CM

## 2017-07-10 DIAGNOSIS — I10 Essential (primary) hypertension: Secondary | ICD-10-CM | POA: Diagnosis not present

## 2017-07-10 MED ORDER — LISINOPRIL 10 MG PO TABS: 10.0000 mg | ORAL_TABLET | Freq: Every day | ORAL | 1 refills | Status: DC

## 2017-07-10 MED ORDER — ATORVASTATIN CALCIUM 10 MG PO TABS: 10.0000 mg | ORAL_TABLET | Freq: Every day | ORAL | 1 refills | Status: DC

## 2017-07-10 NOTE — Assessment & Plan Note (Signed)
Encouraged her to consume a low fat diet Continue Atorvastatin, refilled today

## 2017-07-10 NOTE — Assessment & Plan Note (Signed)
Will get bone density at annual exam Advised her to start Calcium and Vit D OTC Encouraged her to get 30 minutes of weight bearing activity daily.

## 2017-07-10 NOTE — Assessment & Plan Note (Signed)
Controlled on Lisinopril, refilled today Discussed DASH diet and exercise for weight loss

## 2017-07-10 NOTE — Progress Notes (Signed)
HPI  Pt presents to the clinic today to establish care and for management of the conditions listed below. She is transferring care from Dr. Deborra Medina.  HLD: Her last LDL was 71, 03/2016. She denies myalgias on Atorvastatin. She tries to consume a low fat diet.  Osteopenia: Bone density from 07/2010 reviewed. She is taking a multivitamin.   HTN: Her BP today is 128/68. She is taking Lisinopril as prescribed. ECG from 01/2008 reviewed.  Flu: never Tetanus: 11/2009 Pneumovax: never Prevnar: never Zostovax: 10/2013 Shingrix: never Mammogram: 09/2016 Pap Smear: 10/2010, s/p partial hysterectomy Colon Screening: 08/2012, 10 years Vision Screening: annually Dentist: as needed  Past Medical History:  Diagnosis Date  . Abnormal Pap smear of vagina   . Anxiety   . Calcium oxalate renal stones   . Hyperlipidemia   . MVP (mitral valve prolapse)   . Osteoporosis    Osteopenia  . Tobacco abuse     Current Outpatient Medications  Medication Sig Dispense Refill  . atorvastatin (LIPITOR) 10 MG tablet TAKE 1 TABLET BY MOUTH DAILY 90 tablet 1  . lisinopril (PRINIVIL,ZESTRIL) 10 MG tablet Take 1 tablet (10 mg total) by mouth daily. 30 tablet 0  . Multiple Vitamin (MULTIVITAMIN) tablet Take 1 tablet by mouth daily.      . predniSONE (DELTASONE) 10 MG tablet Take 6 tabs day 1, 5 tabs day 2, 4 tabs day 3, 3 tabs day 4, 2 tabs day 5, 1 tab day 6 21 tablet 0   No current facility-administered medications for this visit.     No Known Allergies  Family History  Problem Relation Age of Onset  . Hypertension Mother   . Stroke Father 36  . Heart disease Maternal Grandmother   . Heart disease Maternal Grandfather   . Hypertension Paternal Grandmother   . Cancer Maternal Aunt        breast and lung    Social History   Socioeconomic History  . Marital status: Married    Spouse name: Not on file  . Number of children: 2  . Years of education: Not on file  . Highest education level: Not on file   Social Needs  . Financial resource strain: Not on file  . Food insecurity - worry: Not on file  . Food insecurity - inability: Not on file  . Transportation needs - medical: Not on file  . Transportation needs - non-medical: Not on file  Occupational History  . Occupation: Naval architect  Tobacco Use  . Smoking status: Former Smoker    Packs/day: 0.25    Years: 30.00    Pack years: 7.50    Types: Cigarettes  . Smokeless tobacco: Never Used  Substance and Sexual Activity  . Alcohol use: Yes    Alcohol/week: 0.6 oz    Types: 1 Glasses of wine per week    Comment: occasional  . Drug use: No  . Sexual activity: Not on file  Other Topics Concern  . Not on file  Social History Narrative  . Not on file    ROS:  Constitutional: Denies fever, malaise, fatigue, headache or abrupt weight changes.  HEENT: Denies eye pain, eye redness, ear pain, ringing in the ears, wax buildup, runny nose, nasal congestion, bloody nose, or sore throat. Respiratory: Denies difficulty breathing, shortness of breath, cough or sputum production.   Cardiovascular: Denies chest pain, chest tightness, palpitations or swelling in the hands or feet.  Gastrointestinal: Denies abdominal pain, bloating, constipation, diarrhea or blood in the  stool.  GU: Denies frequency, urgency, pain with urination, blood in urine, odor or discharge. Musculoskeletal: Pt reports intermittent joint pain. Denies decrease in range of motion, difficulty with gait, muscle pain or joint swelling.  Skin: Denies redness, rashes, lesions or ulcercations.  Neurological: Denies dizziness, difficulty with memory, difficulty with speech or problems with balance and coordination.  Psych: Denies anxiety, depression, SI/HI.  No other specific complaints in a complete review of systems (except as listed in HPI above).  PE:  BP 128/68   Pulse 71   Temp 98.1 F (36.7 C) (Oral)   Wt 200 lb 8 oz (90.9 kg)   SpO2 97%   BMI 33.88 kg/m    Wt Readings from Last 3 Encounters:  12/17/16 199 lb (90.3 kg)  10/14/16 199 lb (90.3 kg)  04/10/16 189 lb 8 oz (86 kg)    General: Appears her stated age, obese in NAD. Cardiovascular: Normal rate and rhythm. S1,S2 noted.  No murmur, rubs or gallops noted. No JVD or BLE edema.  Pulmonary/Chest: Normal effort and positive vesicular breath sounds. No respiratory distress. No wheezes, rales or ronchi noted.  Musculoskeletal: No difficulty with gait.  Neurological: Alert and oriented.  Psychiatric: Mood and affect normal. Behavior is normal. Judgment and thought content normal.    BMET    Component Value Date/Time   NA 141 03/26/2016 0845   K 4.3 03/26/2016 0845   CL 108 03/26/2016 0845   CO2 29 03/26/2016 0845   GLUCOSE 88 03/26/2016 0845   BUN 12 03/26/2016 0845   CREATININE 0.81 03/26/2016 0845   CALCIUM 8.7 03/26/2016 0845   GFRNONAA 73.93 11/21/2009 0915   GFRAA 84 03/11/2007 1057    Lipid Panel     Component Value Date/Time   CHOL 137 03/26/2016 0845   TRIG 97.0 03/26/2016 0845   HDL 46.60 03/26/2016 0845   CHOLHDL 3 03/26/2016 0845   VLDL 19.4 03/26/2016 0845   LDLCALC 71 03/26/2016 0845    CBC    Component Value Date/Time   WBC 7.3 03/26/2016 0845   RBC 4.51 03/26/2016 0845   HGB 14.1 03/26/2016 0845   HCT 41.0 03/26/2016 0845   PLT 255.0 03/26/2016 0845   MCV 91.0 03/26/2016 0845   MCHC 34.4 03/26/2016 0845   RDW 13.3 03/26/2016 0845   LYMPHSABS 2.3 03/26/2016 0845   MONOABS 0.8 03/26/2016 0845   EOSABS 0.3 03/26/2016 0845   BASOSABS 0.0 03/26/2016 0845    Hgb A1C No results found for: HGBA1C   Assessment and Plan:

## 2017-07-10 NOTE — Patient Instructions (Signed)
Fat and Cholesterol Restricted Diet Getting too much fat and cholesterol in your diet may cause health problems. Following this diet helps keep your fat and cholesterol at normal levels. This can keep you from getting sick. What types of fat should I choose?  Choose monosaturated and polyunsaturated fats. These are found in foods such as olive oil, canola oil, flaxseeds, walnuts, almonds, and seeds.  Eat more omega-3 fats. Good choices include salmon, mackerel, sardines, tuna, flaxseed oil, and ground flaxseeds.  Limit saturated fats. These are in animal products such as meats, butter, and cream. They can also be in plant products such as palm oil, palm kernel oil, and coconut oil.  Avoid foods with partially hydrogenated oils in them. These contain trans fats. Examples of foods that have trans fats are stick margarine, some tub margarines, cookies, crackers, and other baked goods. What general guidelines do I need to follow?  Check food labels. Look for the words "trans fat" and "saturated fat."  When preparing a meal: ? Fill half of your plate with vegetables and green salads. ? Fill one fourth of your plate with whole grains. Look for the word "whole" as the first word in the ingredient list. ? Fill one fourth of your plate with lean protein foods.  Eat more foods that have fiber, like apples, carrots, beans, peas, and barley.  Eat more home-cooked foods. Eat less at restaurants and buffets.  Limit or avoid alcohol.  Limit foods high in starch and sugar.  Limit fried foods.  Cook foods without frying them. Baking, boiling, grilling, and broiling are all great options.  Lose weight if you are overweight. Losing even a small amount of weight can help your overall health. It can also help prevent diseases such as diabetes and heart disease. What foods can I eat? Grains Whole grains, such as whole wheat or whole grain breads, crackers, cereals, and pasta. Unsweetened oatmeal,  bulgur, barley, quinoa, or brown rice. Corn or whole wheat flour tortillas. Vegetables Fresh or frozen vegetables (raw, steamed, roasted, or grilled). Green salads. Fruits All fresh, canned (in natural juice), or frozen fruits. Meat and Other Protein Products Ground beef (85% or leaner), grass-fed beef, or beef trimmed of fat. Skinless chicken or turkey. Ground chicken or turkey. Pork trimmed of fat. All fish and seafood. Eggs. Dried beans, peas, or lentils. Unsalted nuts or seeds. Unsalted canned or dry beans. Dairy Low-fat dairy products, such as skim or 1% milk, 2% or reduced-fat cheeses, low-fat ricotta or cottage cheese, or plain low-fat yogurt. Fats and Oils Tub margarines without trans fats. Light or reduced-fat mayonnaise and salad dressings. Avocado. Olive, canola, sesame, or safflower oils. Natural peanut or almond butter (choose ones without added sugar and oil). The items listed above may not be a complete list of recommended foods or beverages. Contact your dietitian for more options. What foods are not recommended? Grains White bread. White pasta. White rice. Cornbread. Bagels, pastries, and croissants. Crackers that contain trans fat. Vegetables White potatoes. Corn. Creamed or fried vegetables. Vegetables in a cheese sauce. Fruits Dried fruits. Canned fruit in light or heavy syrup. Fruit juice. Meat and Other Protein Products Fatty cuts of meat. Ribs, chicken wings, bacon, sausage, bologna, salami, chitterlings, fatback, hot dogs, bratwurst, and packaged luncheon meats. Liver and organ meats. Dairy Whole or 2% milk, cream, half-and-half, and cream cheese. Whole milk cheeses. Whole-fat or sweetened yogurt. Full-fat cheeses. Nondairy creamers and whipped toppings. Processed cheese, cheese spreads, or cheese curds. Sweets and Desserts Corn   syrup, sugars, honey, and molasses. Candy. Jam and jelly. Syrup. Sweetened cereals. Cookies, pies, cakes, donuts, muffins, and ice  cream. Fats and Oils Butter, stick margarine, lard, shortening, ghee, or bacon fat. Coconut, palm kernel, or palm oils. Beverages Alcohol. Sweetened drinks (such as sodas, lemonade, and fruit drinks or punches). The items listed above may not be a complete list of foods and beverages to avoid. Contact your dietitian for more information. This information is not intended to replace advice given to you by your health care provider. Make sure you discuss any questions you have with your health care provider. Document Released: 12/24/2011 Document Revised: 02/29/2016 Document Reviewed: 09/23/2013 Elsevier Interactive Patient Education  2018 [employer]  

## 2017-08-20 DIAGNOSIS — M5416 Radiculopathy, lumbar region: Secondary | ICD-10-CM | POA: Diagnosis not present

## 2017-08-20 DIAGNOSIS — M5432 Sciatica, left side: Secondary | ICD-10-CM | POA: Diagnosis not present

## 2017-08-20 DIAGNOSIS — M9903 Segmental and somatic dysfunction of lumbar region: Secondary | ICD-10-CM | POA: Diagnosis not present

## 2017-08-20 DIAGNOSIS — M9905 Segmental and somatic dysfunction of pelvic region: Secondary | ICD-10-CM | POA: Diagnosis not present

## 2017-09-10 ENCOUNTER — Other Ambulatory Visit: Payer: Self-pay | Admitting: Family Medicine

## 2017-09-10 DIAGNOSIS — M5416 Radiculopathy, lumbar region: Secondary | ICD-10-CM | POA: Diagnosis not present

## 2017-09-10 DIAGNOSIS — Z1231 Encounter for screening mammogram for malignant neoplasm of breast: Secondary | ICD-10-CM

## 2017-09-10 DIAGNOSIS — M9903 Segmental and somatic dysfunction of lumbar region: Secondary | ICD-10-CM | POA: Diagnosis not present

## 2017-09-10 DIAGNOSIS — M9905 Segmental and somatic dysfunction of pelvic region: Secondary | ICD-10-CM | POA: Diagnosis not present

## 2017-09-10 DIAGNOSIS — M5432 Sciatica, left side: Secondary | ICD-10-CM | POA: Diagnosis not present

## 2017-09-22 ENCOUNTER — Ambulatory Visit
Admission: RE | Admit: 2017-09-22 | Discharge: 2017-09-22 | Disposition: A | Discharge status: Home / Self Care | Payer: Medicare HMO | Source: Ambulatory Visit | Attending: Family Medicine | Admitting: Family Medicine

## 2017-09-22 DIAGNOSIS — L578 Other skin changes due to chronic exposure to nonionizing radiation: Secondary | ICD-10-CM | POA: Diagnosis not present

## 2017-09-22 DIAGNOSIS — L739 Follicular disorder, unspecified: Secondary | ICD-10-CM | POA: Diagnosis not present

## 2017-09-22 DIAGNOSIS — Z1231 Encounter for screening mammogram for malignant neoplasm of breast: Secondary | ICD-10-CM | POA: Insufficient documentation

## 2017-09-22 DIAGNOSIS — L821 Other seborrheic keratosis: Secondary | ICD-10-CM | POA: Diagnosis not present

## 2017-09-22 DIAGNOSIS — L570 Actinic keratosis: Secondary | ICD-10-CM | POA: Diagnosis not present

## 2017-09-22 DIAGNOSIS — Z1283 Encounter for screening for malignant neoplasm of skin: Secondary | ICD-10-CM | POA: Diagnosis not present

## 2017-09-22 DIAGNOSIS — L814 Other melanin hyperpigmentation: Secondary | ICD-10-CM | POA: Diagnosis not present

## 2017-09-25 ENCOUNTER — Ambulatory Visit (INDEPENDENT_AMBULATORY_CARE_PROVIDER_SITE_OTHER): Payer: Medicare HMO | Admitting: Internal Medicine

## 2017-09-25 ENCOUNTER — Encounter: Payer: Self-pay | Admitting: Internal Medicine

## 2017-09-25 VITALS — BP 126/84 | HR 76 | Temp 98.2°F | Ht 64.5 in | Wt 199.0 lb

## 2017-09-25 DIAGNOSIS — H6123 Impacted cerumen, bilateral: Secondary | ICD-10-CM

## 2017-09-25 DIAGNOSIS — Z0001 Encounter for general adult medical examination with abnormal findings: Secondary | ICD-10-CM | POA: Diagnosis not present

## 2017-09-25 DIAGNOSIS — F5104 Psychophysiologic insomnia: Secondary | ICD-10-CM | POA: Diagnosis not present

## 2017-09-25 DIAGNOSIS — Z1159 Encounter for screening for other viral diseases: Secondary | ICD-10-CM

## 2017-09-25 DIAGNOSIS — Z0000 Encounter for general adult medical examination without abnormal findings: Secondary | ICD-10-CM

## 2017-09-25 DIAGNOSIS — R69 Illness, unspecified: Secondary | ICD-10-CM | POA: Diagnosis not present

## 2017-09-25 DIAGNOSIS — E559 Vitamin D deficiency, unspecified: Secondary | ICD-10-CM | POA: Diagnosis not present

## 2017-09-25 LAB — CBC
HEMATOCRIT: 45.4 % (ref 36.0–46.0)
HEMOGLOBIN: 15.1 g/dL — AB (ref 12.0–15.0)
MCHC: 33.2 g/dL (ref 30.0–36.0)
MCV: 92.1 fl (ref 78.0–100.0)
PLATELETS: 297 10*3/uL (ref 150.0–400.0)
RBC: 4.93 Mil/uL (ref 3.87–5.11)
RDW: 13.5 % (ref 11.5–15.5)
WBC: 7.2 10*3/uL (ref 4.0–10.5)

## 2017-09-25 LAB — VITAMIN D 25 HYDROXY (VIT D DEFICIENCY, FRACTURES): VITD: 26.17 ng/mL — ABNORMAL LOW (ref 30.00–100.00)

## 2017-09-25 MED ORDER — TRAZODONE HCL 50 MG PO TABS: 25.0000 mg | ORAL_TABLET | Freq: Every evening | ORAL | 3 refills | Status: DC | PRN

## 2017-09-25 NOTE — Patient Instructions (Signed)
Health Maintenance for Postmenopausal Women Menopause is a normal process in which your reproductive ability comes to an end. This process happens gradually over a span of months to years, usually between the ages of 22 and 9. Menopause is complete when you have missed 12 consecutive menstrual periods. It is important to talk with your health care provider about some of the most common conditions that affect postmenopausal women, such as heart disease, cancer, and bone loss (osteoporosis). Adopting a healthy lifestyle and getting preventive care can help to promote your health and wellness. Those actions can also lower your chances of developing some of these common conditions. What should I know about menopause? During menopause, you may experience a number of symptoms, such as:  Moderate-to-severe hot flashes.  Night sweats.  Decrease in sex drive.  Mood swings.  Headaches.  Tiredness.  Irritability.  Memory problems.  Insomnia.  Choosing to treat or not to treat menopausal changes is an individual decision that you make with your health care provider. What should I know about hormone replacement therapy and supplements? Hormone therapy products are effective for treating symptoms that are associated with menopause, such as hot flashes and night sweats. Hormone replacement carries certain risks, especially as you become older. If you are thinking about using estrogen or estrogen with progestin treatments, discuss the benefits and risks with your health care provider. What should I know about heart disease and stroke? Heart disease, heart attack, and stroke become more likely as you age. This may be due, in part, to the hormonal changes that your body experiences during menopause. These can affect how your body processes dietary fats, triglycerides, and cholesterol. Heart attack and stroke are both medical emergencies. There are many things that you can do to help prevent heart disease  and stroke:  Have your blood pressure checked at least every 1-2 years. High blood pressure causes heart disease and increases the risk of stroke.  If you are 53-22 years old, ask your health care provider if you should take aspirin to prevent a heart attack or a stroke.  Do not use any tobacco products, including cigarettes, chewing tobacco, or electronic cigarettes. If you need help quitting, ask your health care provider.  It is important to eat a healthy diet and maintain a healthy weight. ? Be sure to include plenty of vegetables, fruits, low-fat dairy products, and lean protein. ? Avoid eating foods that are high in solid fats, added sugars, or salt (sodium).  Get regular exercise. This is one of the most important things that you can do for your health. ? Try to exercise for at least 150 minutes each week. The type of exercise that you do should increase your heart rate and make you sweat. This is known as moderate-intensity exercise. ? Try to do strengthening exercises at least twice each week. Do these in addition to the moderate-intensity exercise.  Know your numbers.Ask your health care provider to check your cholesterol and your blood glucose. Continue to have your blood tested as directed by your health care provider.  What should I know about cancer screening? There are several types of cancer. Take the following steps to reduce your risk and to catch any cancer development as early as possible. Breast Cancer  Practice breast self-awareness. ? This means understanding how your breasts normally appear and feel. ? It also means doing regular breast self-exams. Let your health care provider know about any changes, no matter how small.  If you are 40  or older, have a clinician do a breast exam (clinical breast exam or CBE) every year. Depending on your age, family history, and medical history, it may be recommended that you also have a yearly breast X-ray (mammogram).  If you  have a family history of breast cancer, talk with your health care provider about genetic screening.  If you are at high risk for breast cancer, talk with your health care provider about having an MRI and a mammogram every year.  Breast cancer (BRCA) gene test is recommended for women who have family members with BRCA-related cancers. Results of the assessment will determine the need for genetic counseling and BRCA1 and for BRCA2 testing. BRCA-related cancers include these types: ? Breast. This occurs in males or females. ? Ovarian. ? Tubal. This may also be called fallopian tube cancer. ? Cancer of the abdominal or pelvic lining (peritoneal cancer). ? Prostate. ? Pancreatic.  Cervical, Uterine, and Ovarian Cancer Your health care provider may recommend that you be screened regularly for cancer of the pelvic organs. These include your ovaries, uterus, and vagina. This screening involves a pelvic exam, which includes checking for microscopic changes to the surface of your cervix (Pap test).  For women ages 21-65, health care providers may recommend a pelvic exam and a Pap test every three years. For women ages 79-65, they may recommend the Pap test and pelvic exam, combined with testing for human papilloma virus (HPV), every five years. Some types of HPV increase your risk of cervical cancer. Testing for HPV may also be done on women of any age who have unclear Pap test results.  Other health care providers may not recommend any screening for nonpregnant women who are considered low risk for pelvic cancer and have no symptoms. Ask your health care provider if a screening pelvic exam is right for you.  If you have had past treatment for cervical cancer or a condition that could lead to cancer, you need Pap tests and screening for cancer for at least 20 years after your treatment. If Pap tests have been discontinued for you, your risk factors (such as having a new sexual partner) need to be  reassessed to determine if you should start having screenings again. Some women have medical problems that increase the chance of getting cervical cancer. In these cases, your health care provider may recommend that you have screening and Pap tests more often.  If you have a family history of uterine cancer or ovarian cancer, talk with your health care provider about genetic screening.  If you have vaginal bleeding after reaching menopause, tell your health care provider.  There are currently no reliable tests available to screen for ovarian cancer.  Lung Cancer Lung cancer screening is recommended for adults 69-62 years old who are at high risk for lung cancer because of a history of smoking. A yearly low-dose CT scan of the lungs is recommended if you:  Currently smoke.  Have a history of at least 30 pack-years of smoking and you currently smoke or have quit within the past 15 years. A pack-year is smoking an average of one pack of cigarettes per day for one year.  Yearly screening should:  Continue until it has been 15 years since you quit.  Stop if you develop a health problem that would prevent you from having lung cancer treatment.  Colorectal Cancer  This type of cancer can be detected and can often be prevented.  Routine colorectal cancer screening usually begins at  age 42 and continues through age 45.  If you have risk factors for colon cancer, your health care provider may recommend that you be screened at an earlier age.  If you have a family history of colorectal cancer, talk with your health care provider about genetic screening.  Your health care provider may also recommend using home test kits to check for hidden blood in your stool.  A small camera at the end of a tube can be used to examine your colon directly (sigmoidoscopy or colonoscopy). This is done to check for the earliest forms of colorectal cancer.  Direct examination of the colon should be repeated every  5-10 years until age 71. However, if early forms of precancerous polyps or small growths are found or if you have a family history or genetic risk for colorectal cancer, you may need to be screened more often.  Skin Cancer  Check your skin from head to toe regularly.  Monitor any moles. Be sure to tell your health care provider: ? About any new moles or changes in moles, especially if there is a change in a mole's shape or color. ? If you have a mole that is larger than the size of a pencil eraser.  If any of your family members has a history of skin cancer, especially at a young age, talk with your health care provider about genetic screening.  Always use sunscreen. Apply sunscreen liberally and repeatedly throughout the day.  Whenever you are outside, protect yourself by wearing long sleeves, pants, a wide-brimmed hat, and sunglasses.  What should I know about osteoporosis? Osteoporosis is a condition in which bone destruction happens more quickly than new bone creation. After menopause, you may be at an increased risk for osteoporosis. To help prevent osteoporosis or the bone fractures that can happen because of osteoporosis, the following is recommended:  If you are 46-71 years old, get at least 1,000 mg of calcium and at least 600 mg of vitamin D per day.  If you are older than age 55 but younger than age 65, get at least 1,200 mg of calcium and at least 600 mg of vitamin D per day.  If you are older than age 54, get at least 1,200 mg of calcium and at least 800 mg of vitamin D per day.  Smoking and excessive alcohol intake increase the risk of osteoporosis. Eat foods that are rich in calcium and vitamin D, and do weight-bearing exercises several times each week as directed by your health care provider. What should I know about how menopause affects my mental health? Depression may occur at any age, but it is more common as you become older. Common symptoms of depression  include:  Low or sad mood.  Changes in sleep patterns.  Changes in appetite or eating patterns.  Feeling an overall lack of motivation or enjoyment of activities that you previously enjoyed.  Frequent crying spells.  Talk with your health care provider if you think that you are experiencing depression. What should I know about immunizations? It is important that you get and maintain your immunizations. These include:  Tetanus, diphtheria, and pertussis (Tdap) booster vaccine.  Influenza every year before the flu season begins.  Pneumonia vaccine.  Shingles vaccine.  Your health care provider may also recommend other immunizations. This information is not intended to replace advice given to you by your health care provider. Make sure you discuss any questions you have with your health care provider. Document Released: 08/16/2005  Document Revised: 01/12/2016 Document Reviewed: 03/28/2015 Else[employer] Interactive Patient Education  2018 [employer]  

## 2017-09-25 NOTE — Progress Notes (Addendum)
HPI:  Pt presents to the clinic today for her Medicare Wellness Exam.   She c/o insomnia. This has been going on for the last few months. She reports her son is an alcoholic. She is very concerned about him and she thinks this is why she isn't able to sleep at night. She is having trouble falling asleep and staying asleep. She has tried Melatonin, Benadryl and Tylenol PM with minimal relief.   Past Medical History:  Diagnosis Date  . Calcium oxalate renal stones   . Hyperlipidemia   . MVP (mitral valve prolapse)     Current Outpatient Medications  Medication Sig Dispense Refill  . atorvastatin (LIPITOR) 10 MG tablet Take 1 tablet (10 mg total) by mouth daily. 90 tablet 1  . lisinopril (PRINIVIL,ZESTRIL) 10 MG tablet Take 1 tablet (10 mg total) by mouth daily. 90 tablet 1  . Multiple Vitamin (MULTIVITAMIN) tablet Take 1 tablet by mouth daily.       No current facility-administered medications for this visit.     No Known Allergies  Family History  Problem Relation Age of Onset  . Hypertension Mother   . Stroke Father 83  . Heart disease Maternal Grandmother   . Heart disease Maternal Grandfather   . Hypertension Paternal Grandmother   . Cancer Maternal Aunt        breast and lung    Social History   Socioeconomic History  . Marital status: Married    Spouse name: Not on file  . Number of children: 2  . Years of education: Not on file  . Highest education level: Not on file  Occupational History  . Occupation: Naval architect  Social Needs  . Financial resource strain: Not on file  . Food insecurity:    Worry: Not on file    Inability: Not on file  . Transportation needs:    Medical: Not on file    Non-medical: Not on file  Tobacco Use  . Smoking status: Former Smoker    Packs/day: 0.25    Years: 30.00    Pack years: 7.50    Types: Cigarettes  . Smokeless tobacco: Never Used  Substance and Sexual Activity  . Alcohol use: Yes    Alcohol/week: 0.6 oz     Types: 1 Glasses of wine per week    Comment: occasional  . Drug use: No  . Sexual activity: Not on file  Lifestyle  . Physical activity:    Days per week: Not on file    Minutes per session: Not on file  . Stress: Not on file  Relationships  . Social connections:    Talks on phone: Not on file    Gets together: Not on file    Attends religious service: Not on file    Active member of club or organization: Not on file    Attends meetings of clubs or organizations: Not on file    Relationship status: Not on file  . Intimate partner violence:    Fear of current or ex partner: Not on file    Emotionally abused: Not on file    Physically abused: Not on file    Forced sexual activity: Not on file  Other Topics Concern  . Not on file  Social History Narrative  . Not on file    Hospitiliaztions: None  Health Maintenance:    Flu: never  Tetanus: 11/2009  Pneumovax: never  Prevnar: never  Zostavax: 10/2013  Mammogram: 09/2017  Pap Smear: 2012-  hysterectomy  Bone Density: never  Colon Screening: 08/2012  Eye Doctor: annually  Dental Exam: as needed   Providers:   PCP: Webb Silversmith, NP-C  Dermatologist: Dr. Nicole Kindred    I have personally reviewed and have noted:  1. The patient's medical and social history 2. Their use of alcohol, tobacco or illicit drugs 3. Their current medications and supplements 4. The patient's functional ability including ADL's, fall risks, home safety risks and hearing or visual impairment. 5. Diet and physical activities 6. Evidence for depression or mood disorder  Subjective:   Review of Systems:   Constitutional: Denies fever, malaise, fatigue, headache or abrupt weight changes.  HEENT: Pt reports hearing loss secondary to wax buildup. Denies eye pain, eye redness, ear pain, ringing in the ears, runny nose, nasal congestion, bloody nose, or sore throat. Respiratory: Denies difficulty breathing, shortness of breath, cough or sputum  production.   Cardiovascular: Denies chest pain, chest tightness, palpitations or swelling in the hands or feet.  Gastrointestinal: Pt reports reflux (triggered by spicy and greasy foods). Denies abdominal pain, bloating, constipation, diarrhea or blood in the stool.  GU: Denies urgency, frequency, pain with urination, burning sensation, blood in urine, odor or discharge. Musculoskeletal: Denies decrease in range of motion, difficulty with gait, muscle pain or joint pain and swelling.  Skin: Denies redness, rashes, lesions or ulcercations.  Neurological: Pt reports insomnia. Denies dizziness, difficulty with memory, difficulty with speech or problems with balance and coordination.  Psych: Denies anxiety, depression, SI/HI.  No other specific complaints in a complete review of systems (except as listed in HPI above).  Objective:  PE:   BP 126/84   Pulse 76   Temp 98.2 F (36.8 C) (Oral)   Ht 5' 4.5" (1.638 m)   Wt 199 lb (90.3 kg)   SpO2 98%   BMI 33.63 kg/m   Wt Readings from Last 3 Encounters:  07/10/17 200 lb 8 oz (90.9 kg)  12/17/16 199 lb (90.3 kg)  10/14/16 199 lb (90.3 kg)    General: Appears her stated age, obese in NAD. ENT: Bilateral cerumen buildup. Cardiovascular: Normal rate and rhythm. S1,S2 noted.  No murmur, rubs or gallops noted. No JVD or BLE edema. No carotid bruits noted. Pulmonary/Chest: Normal effort and positive vesicular breath sounds. No respiratory distress. No wheezes, rales or ronchi noted.  Abdomen: Soft and nontender. Normal bowel sounds. No distention or masses noted. Liver, spleen and kidneys non palpable. Musculoskeletal:  Strength 5/5 BUE/BLE. No difficulty with gait.  Neurological: Alert and oriented. Coordination normal.  Psychiatric: Mood and affect normal. Behavior is normal. Judgment and thought content normal.     BMET    Component Value Date/Time   NA 141 03/26/2016 0845   K 4.3 03/26/2016 0845   CL 108 03/26/2016 0845   CO2 29  03/26/2016 0845   GLUCOSE 88 03/26/2016 0845   BUN 12 03/26/2016 0845   CREATININE 0.81 03/26/2016 0845   CALCIUM 8.7 03/26/2016 0845   GFRNONAA 73.93 11/21/2009 0915   GFRAA 84 03/11/2007 1057    Lipid Panel     Component Value Date/Time   CHOL 137 03/26/2016 0845   TRIG 97.0 03/26/2016 0845   HDL 46.60 03/26/2016 0845   CHOLHDL 3 03/26/2016 0845   VLDL 19.4 03/26/2016 0845   LDLCALC 71 03/26/2016 0845    CBC    Component Value Date/Time   WBC 7.3 03/26/2016 0845   RBC 4.51 03/26/2016 0845   HGB 14.1 03/26/2016 0845  HCT 41.0 03/26/2016 0845   PLT 255.0 03/26/2016 0845   MCV 91.0 03/26/2016 0845   MCHC 34.4 03/26/2016 0845   RDW 13.3 03/26/2016 0845   LYMPHSABS 2.3 03/26/2016 0845   MONOABS 0.8 03/26/2016 0845   EOSABS 0.3 03/26/2016 0845   BASOSABS 0.0 03/26/2016 0845    Hgb A1C No results found for: HGBA1C    Assessment and Plan:   Medicare Annual Wellness Visit:  Diet: She does eat meat. She consumes fruits and veggies daily. She rarely eats fried foods. She drinks mostly Gatorade, Powerade, Sweet Tea. Physical activity: Treadmill for 20 minutes 3 days per week Depression/mood screen: Negative Hearing: Intact to whispered voice Visual acuity: Grossly normal, performs annual eye exam  ADLs: Capable Fall risk: None Home safety: Good Cognitive evaluation: Intact to orientation, naming, recall and repetition EOL planning: No adv directives, full code/ I agree  Preventative Medicine: She declines flu, pneumovax or prevnar. Tetanus and zostovax UTD. Mammogram UTD. She no longer needs pap smears. She declines bone density exam. Colon screening UTD. Encouraged her to consume a balanced diet and exercise regimen. Advised her to see an eye doctor and dentist annually. Will check CBC, CMET, Lipid, Hep C and Vit D today.  Insomnia:  Support offered today Will trial Trazadone, eRx sent to pharmacy  Hearing Loss Secondary to Bilateral Cerumen  Impaction:  Manual lavage by CMA, canal's clear but erythematous, TM's normal. Pt tolerated well. Advised her to try Debrox 2 x week to prevent wax buildup   Next appointment: 1 year, Medicare Wellness Exam   Webb Silversmith, NP

## 2017-09-26 LAB — COMPREHENSIVE METABOLIC PANEL
ALT: 23 U/L (ref 0–35)
AST: 18 U/L (ref 0–37)
Albumin: 4.3 g/dL (ref 3.5–5.2)
Alkaline Phosphatase: 64 U/L (ref 39–117)
BILIRUBIN TOTAL: 0.4 mg/dL (ref 0.2–1.2)
BUN: 13 mg/dL (ref 6–23)
CALCIUM: 9.7 mg/dL (ref 8.4–10.5)
CO2: 22 mEq/L (ref 19–32)
Chloride: 105 mEq/L (ref 96–112)
Creatinine, Ser: 0.94 mg/dL (ref 0.40–1.20)
GFR: 63.29 mL/min (ref >60.00)
GLUCOSE: 97 mg/dL (ref 70–99)
POTASSIUM: 4.9 meq/L (ref 3.5–5.1)
Sodium: 141 mEq/L (ref 135–145)
Total Protein: 7.6 g/dL (ref 6.0–8.3)

## 2017-09-26 LAB — LIPID PANEL
Cholesterol: 148 mg/dL (ref 0–200)
HDL: 53.9 mg/dL (ref >39.00)
LDL Cholesterol: 79 mg/dL (ref 0–99)
NONHDL: 93.87
TRIGLYCERIDES: 73 mg/dL (ref 0.0–149.0)
Total CHOL/HDL Ratio: 3
VLDL: 14.6 mg/dL (ref 0.0–40.0)

## 2017-09-26 LAB — HEPATITIS C ANTIBODY
Hepatitis C Ab: NONREACTIVE
SIGNAL TO CUT-OFF: 0.04 (ref <1.00)

## 2017-09-27 ENCOUNTER — Encounter: Payer: Self-pay | Admitting: Internal Medicine

## 2017-10-08 DIAGNOSIS — M5432 Sciatica, left side: Secondary | ICD-10-CM | POA: Diagnosis not present

## 2017-10-08 DIAGNOSIS — M5416 Radiculopathy, lumbar region: Secondary | ICD-10-CM | POA: Diagnosis not present

## 2017-10-08 DIAGNOSIS — M9903 Segmental and somatic dysfunction of lumbar region: Secondary | ICD-10-CM | POA: Diagnosis not present

## 2017-10-08 DIAGNOSIS — M9905 Segmental and somatic dysfunction of pelvic region: Secondary | ICD-10-CM | POA: Diagnosis not present

## 2017-10-20 ENCOUNTER — Other Ambulatory Visit: Payer: Self-pay

## 2017-10-20 MED ORDER — TRAZODONE HCL 50 MG PO TABS: 25.0000 mg | ORAL_TABLET | Freq: Every evening | ORAL | 0 refills | Status: DC | PRN

## 2017-10-30 ENCOUNTER — Ambulatory Visit (INDEPENDENT_AMBULATORY_CARE_PROVIDER_SITE_OTHER): Payer: Medicare HMO | Admitting: Internal Medicine

## 2017-10-30 ENCOUNTER — Encounter: Payer: Self-pay | Admitting: Internal Medicine

## 2017-10-30 DIAGNOSIS — J209 Acute bronchitis, unspecified: Secondary | ICD-10-CM

## 2017-10-30 MED ORDER — METHYLPREDNISOLONE ACETATE 80 MG/ML IJ SUSP
80.0000 mg | Freq: Once | INTRAMUSCULAR | Status: AC
  Administered 2017-10-30: 80 mg via INTRAMUSCULAR

## 2017-10-30 MED ORDER — AZITHROMYCIN 250 MG PO TABS: ORAL_TABLET | ORAL | 0 refills | Status: DC

## 2017-10-30 NOTE — Patient Instructions (Signed)

## 2017-10-30 NOTE — Progress Notes (Signed)
HPI  Pt presents to the clinic today with c/o sore throat, cough and chest congestion. This started 4 days ago. The cough is non productive. She denies shortness of breath. She denies runny nose, nasal congestion or ear pain.  She reports low grade fever at night but denies chills or body aches. She has tried Ibuprofen and Mucinex with minimal relief. She denies history of allergies or breathing problems. She has not had sick contacts. She no longer smokes.   Review of Systems      Past Medical History:  Diagnosis Date  . Calcium oxalate renal stones   . Hyperlipidemia   . MVP (mitral valve prolapse)     Family History  Problem Relation Age of Onset  . Hypertension Mother   . Stroke Father 28  . Heart disease Maternal Grandmother   . Heart disease Maternal Grandfather   . Hypertension Paternal Grandmother   . Cancer Maternal Aunt        breast and lung    Social History   Socioeconomic History  . Marital status: Married    Spouse name: Not on file  . Number of children: 2  . Years of education: Not on file  . Highest education level: Not on file  Occupational History  . Occupation: Naval architect  Social Needs  . Financial resource strain: Not on file  . Food insecurity:    Worry: Not on file    Inability: Not on file  . Transportation needs:    Medical: Not on file    Non-medical: Not on file  Tobacco Use  . Smoking status: Former Smoker    Packs/day: 0.25    Years: 30.00    Pack years: 7.50    Types: Cigarettes  . Smokeless tobacco: Never Used  Substance and Sexual Activity  . Alcohol use: Yes    Alcohol/week: 0.6 oz    Types: 1 Glasses of wine per week    Comment: occasional  . Drug use: No  . Sexual activity: Not on file  Lifestyle  . Physical activity:    Days per week: Not on file    Minutes per session: Not on file  . Stress: Not on file  Relationships  . Social connections:    Talks on phone: Not on file    Gets together: Not on file   Attends religious service: Not on file    Active member of club or organization: Not on file    Attends meetings of clubs or organizations: Not on file    Relationship status: Not on file  . Intimate partner violence:    Fear of current or ex partner: Not on file    Emotionally abused: Not on file    Physically abused: Not on file    Forced sexual activity: Not on file  Other Topics Concern  . Not on file  Social History Narrative  . Not on file    No Known Allergies   Constitutional: Denies headache, fatigue, fever or abrupt weight changes.  HEENT:  Positive sore throat. Denies eye redness, eye pain, pressure behind the eyes, facial pain, nasal congestion, ear pain, ringing in the ears, wax buildup, runny nose or bloody nose. Respiratory: Positive cough. Denies difficulty breathing or shortness of breath.  Cardiovascular: Denies chest pain, chest tightness, palpitations or swelling in the hands or feet.   No other specific complaints in a complete review of systems (except as listed in HPI above).  Objective:   BP 118/60 (  BP Location: Right Arm, Patient Position: Sitting, Cuff Size: Normal)   Pulse 89   Temp 98.5 F (36.9 C) (Oral)   Ht 5' 4.5" (1.638 m)   Wt 200 lb 8 oz (90.9 kg)   SpO2 96%   BMI 33.88 kg/m  Wt Readings from Last 3 Encounters:  10/30/17 200 lb 8 oz (90.9 kg)  09/25/17 199 lb (90.3 kg)  07/10/17 200 lb 8 oz (90.9 kg)     General: Appears her stated age, in NAD. HEENT: Head: normal shape and size, no sinus tenderness noted;Throat/Mouth: + PND. Teeth present, mucosa erythematous and moist, no exudate noted, no lesions or ulcerations noted.  Neck: No cervical lymphadenopathy.  Cardiovascular: Normal rate and rhythm. S1,S2 noted.  No murmur, rubs or gallops noted.  Pulmonary/Chest: Normal effort with scattered rhonchi and intermittent bilateral expiratory wheezing noted. No respiratory distress.       Assessment & Plan:   Acute Bronchitis:  Get some  rest and drink plenty of water Do salt water gargles for the sore throat Start Zyrtec OTC eRx for Azithromax x 5 days 80 mg Depo IM today Delsym as needed for cough  RTC as needed or if symptoms persist.   Webb Silversmith, NP

## 2017-11-04 DIAGNOSIS — M9903 Segmental and somatic dysfunction of lumbar region: Secondary | ICD-10-CM | POA: Diagnosis not present

## 2017-11-04 DIAGNOSIS — M9905 Segmental and somatic dysfunction of pelvic region: Secondary | ICD-10-CM | POA: Diagnosis not present

## 2017-11-04 DIAGNOSIS — M5432 Sciatica, left side: Secondary | ICD-10-CM | POA: Diagnosis not present

## 2017-11-04 DIAGNOSIS — M5416 Radiculopathy, lumbar region: Secondary | ICD-10-CM | POA: Diagnosis not present

## 2017-12-04 DIAGNOSIS — M9903 Segmental and somatic dysfunction of lumbar region: Secondary | ICD-10-CM | POA: Diagnosis not present

## 2017-12-04 DIAGNOSIS — M9905 Segmental and somatic dysfunction of pelvic region: Secondary | ICD-10-CM | POA: Diagnosis not present

## 2017-12-04 DIAGNOSIS — M5416 Radiculopathy, lumbar region: Secondary | ICD-10-CM | POA: Diagnosis not present

## 2017-12-04 DIAGNOSIS — M5432 Sciatica, left side: Secondary | ICD-10-CM | POA: Diagnosis not present

## 2017-12-31 DIAGNOSIS — M5432 Sciatica, left side: Secondary | ICD-10-CM | POA: Diagnosis not present

## 2017-12-31 DIAGNOSIS — M9905 Segmental and somatic dysfunction of pelvic region: Secondary | ICD-10-CM | POA: Diagnosis not present

## 2017-12-31 DIAGNOSIS — M9903 Segmental and somatic dysfunction of lumbar region: Secondary | ICD-10-CM | POA: Diagnosis not present

## 2017-12-31 DIAGNOSIS — M5416 Radiculopathy, lumbar region: Secondary | ICD-10-CM | POA: Diagnosis not present

## 2018-01-08 ENCOUNTER — Other Ambulatory Visit: Payer: Self-pay | Admitting: Internal Medicine

## 2018-01-20 ENCOUNTER — Other Ambulatory Visit: Payer: Self-pay | Admitting: Internal Medicine

## 2018-01-29 DIAGNOSIS — M5432 Sciatica, left side: Secondary | ICD-10-CM | POA: Diagnosis not present

## 2018-01-29 DIAGNOSIS — M9905 Segmental and somatic dysfunction of pelvic region: Secondary | ICD-10-CM | POA: Diagnosis not present

## 2018-01-29 DIAGNOSIS — M5416 Radiculopathy, lumbar region: Secondary | ICD-10-CM | POA: Diagnosis not present

## 2018-01-29 DIAGNOSIS — M9903 Segmental and somatic dysfunction of lumbar region: Secondary | ICD-10-CM | POA: Diagnosis not present

## 2018-02-26 DIAGNOSIS — M9903 Segmental and somatic dysfunction of lumbar region: Secondary | ICD-10-CM | POA: Diagnosis not present

## 2018-02-26 DIAGNOSIS — M9905 Segmental and somatic dysfunction of pelvic region: Secondary | ICD-10-CM | POA: Diagnosis not present

## 2018-02-26 DIAGNOSIS — M5432 Sciatica, left side: Secondary | ICD-10-CM | POA: Diagnosis not present

## 2018-02-26 DIAGNOSIS — M5416 Radiculopathy, lumbar region: Secondary | ICD-10-CM | POA: Diagnosis not present

## 2018-03-17 DIAGNOSIS — H524 Presbyopia: Secondary | ICD-10-CM | POA: Diagnosis not present

## 2018-03-26 DIAGNOSIS — M9903 Segmental and somatic dysfunction of lumbar region: Secondary | ICD-10-CM | POA: Diagnosis not present

## 2018-03-26 DIAGNOSIS — M5416 Radiculopathy, lumbar region: Secondary | ICD-10-CM | POA: Diagnosis not present

## 2018-03-26 DIAGNOSIS — M5432 Sciatica, left side: Secondary | ICD-10-CM | POA: Diagnosis not present

## 2018-03-26 DIAGNOSIS — M9905 Segmental and somatic dysfunction of pelvic region: Secondary | ICD-10-CM | POA: Diagnosis not present

## 2018-04-30 DIAGNOSIS — M9905 Segmental and somatic dysfunction of pelvic region: Secondary | ICD-10-CM | POA: Diagnosis not present

## 2018-04-30 DIAGNOSIS — M5416 Radiculopathy, lumbar region: Secondary | ICD-10-CM | POA: Diagnosis not present

## 2018-04-30 DIAGNOSIS — M5432 Sciatica, left side: Secondary | ICD-10-CM | POA: Diagnosis not present

## 2018-04-30 DIAGNOSIS — M9903 Segmental and somatic dysfunction of lumbar region: Secondary | ICD-10-CM | POA: Diagnosis not present

## 2018-05-28 DIAGNOSIS — M9903 Segmental and somatic dysfunction of lumbar region: Secondary | ICD-10-CM | POA: Diagnosis not present

## 2018-05-28 DIAGNOSIS — M9905 Segmental and somatic dysfunction of pelvic region: Secondary | ICD-10-CM | POA: Diagnosis not present

## 2018-05-28 DIAGNOSIS — M5432 Sciatica, left side: Secondary | ICD-10-CM | POA: Diagnosis not present

## 2018-05-28 DIAGNOSIS — M5416 Radiculopathy, lumbar region: Secondary | ICD-10-CM | POA: Diagnosis not present

## 2018-06-15 ENCOUNTER — Other Ambulatory Visit: Payer: Self-pay | Admitting: Internal Medicine

## 2018-06-25 DIAGNOSIS — M5432 Sciatica, left side: Secondary | ICD-10-CM | POA: Diagnosis not present

## 2018-06-25 DIAGNOSIS — M5416 Radiculopathy, lumbar region: Secondary | ICD-10-CM | POA: Diagnosis not present

## 2018-06-25 DIAGNOSIS — M9903 Segmental and somatic dysfunction of lumbar region: Secondary | ICD-10-CM | POA: Diagnosis not present

## 2018-06-25 DIAGNOSIS — M9905 Segmental and somatic dysfunction of pelvic region: Secondary | ICD-10-CM | POA: Diagnosis not present

## 2018-07-18 ENCOUNTER — Other Ambulatory Visit: Payer: Self-pay | Admitting: Internal Medicine

## 2018-07-23 DIAGNOSIS — M9903 Segmental and somatic dysfunction of lumbar region: Secondary | ICD-10-CM | POA: Diagnosis not present

## 2018-07-23 DIAGNOSIS — M5416 Radiculopathy, lumbar region: Secondary | ICD-10-CM | POA: Diagnosis not present

## 2018-07-23 DIAGNOSIS — M9905 Segmental and somatic dysfunction of pelvic region: Secondary | ICD-10-CM | POA: Diagnosis not present

## 2018-07-23 DIAGNOSIS — M5432 Sciatica, left side: Secondary | ICD-10-CM | POA: Diagnosis not present

## 2018-07-30 ENCOUNTER — Ambulatory Visit (INDEPENDENT_AMBULATORY_CARE_PROVIDER_SITE_OTHER): Payer: Medicare HMO

## 2018-07-30 DIAGNOSIS — Z23 Encounter for immunization: Secondary | ICD-10-CM | POA: Diagnosis not present

## 2018-08-20 DIAGNOSIS — M5432 Sciatica, left side: Secondary | ICD-10-CM | POA: Diagnosis not present

## 2018-08-20 DIAGNOSIS — M9905 Segmental and somatic dysfunction of pelvic region: Secondary | ICD-10-CM | POA: Diagnosis not present

## 2018-08-20 DIAGNOSIS — M5416 Radiculopathy, lumbar region: Secondary | ICD-10-CM | POA: Diagnosis not present

## 2018-08-20 DIAGNOSIS — M9903 Segmental and somatic dysfunction of lumbar region: Secondary | ICD-10-CM | POA: Diagnosis not present

## 2018-09-17 DIAGNOSIS — M9905 Segmental and somatic dysfunction of pelvic region: Secondary | ICD-10-CM | POA: Diagnosis not present

## 2018-09-17 DIAGNOSIS — M5432 Sciatica, left side: Secondary | ICD-10-CM | POA: Diagnosis not present

## 2018-09-17 DIAGNOSIS — M5416 Radiculopathy, lumbar region: Secondary | ICD-10-CM | POA: Diagnosis not present

## 2018-09-17 DIAGNOSIS — M9903 Segmental and somatic dysfunction of lumbar region: Secondary | ICD-10-CM | POA: Diagnosis not present

## 2018-09-22 DIAGNOSIS — L812 Freckles: Secondary | ICD-10-CM | POA: Diagnosis not present

## 2018-09-22 DIAGNOSIS — L859 Epidermal thickening, unspecified: Secondary | ICD-10-CM | POA: Diagnosis not present

## 2018-09-22 DIAGNOSIS — L739 Follicular disorder, unspecified: Secondary | ICD-10-CM | POA: Diagnosis not present

## 2018-09-22 DIAGNOSIS — Z1283 Encounter for screening for malignant neoplasm of skin: Secondary | ICD-10-CM | POA: Diagnosis not present

## 2018-09-22 DIAGNOSIS — L820 Inflamed seborrheic keratosis: Secondary | ICD-10-CM | POA: Diagnosis not present

## 2018-09-22 DIAGNOSIS — L821 Other seborrheic keratosis: Secondary | ICD-10-CM | POA: Diagnosis not present

## 2018-09-22 DIAGNOSIS — D1800 Hemangioma unspecified site: Secondary | ICD-10-CM | POA: Diagnosis not present

## 2018-09-22 DIAGNOSIS — L578 Other skin changes due to chronic exposure to nonionizing radiation: Secondary | ICD-10-CM | POA: Diagnosis not present

## 2018-10-04 ENCOUNTER — Other Ambulatory Visit: Payer: Self-pay | Admitting: Internal Medicine

## 2018-10-13 ENCOUNTER — Other Ambulatory Visit: Payer: Self-pay | Admitting: Internal Medicine

## 2018-10-13 DIAGNOSIS — M5416 Radiculopathy, lumbar region: Secondary | ICD-10-CM | POA: Diagnosis not present

## 2018-10-13 DIAGNOSIS — M9905 Segmental and somatic dysfunction of pelvic region: Secondary | ICD-10-CM | POA: Diagnosis not present

## 2018-10-13 DIAGNOSIS — M5432 Sciatica, left side: Secondary | ICD-10-CM | POA: Diagnosis not present

## 2018-10-13 DIAGNOSIS — Z1231 Encounter for screening mammogram for malignant neoplasm of breast: Secondary | ICD-10-CM

## 2018-10-13 DIAGNOSIS — M9903 Segmental and somatic dysfunction of lumbar region: Secondary | ICD-10-CM | POA: Diagnosis not present

## 2018-10-13 NOTE — Telephone Encounter (Signed)
Pt has appt tomorrow will wait

## 2018-10-13 NOTE — Telephone Encounter (Signed)
appt scheduled for tomorrow

## 2018-10-13 NOTE — Telephone Encounter (Signed)
Left message on voicemail to see when pt can do a video visit to f/u

## 2018-10-14 ENCOUNTER — Encounter: Payer: Self-pay | Admitting: Internal Medicine

## 2018-10-14 ENCOUNTER — Ambulatory Visit (INDEPENDENT_AMBULATORY_CARE_PROVIDER_SITE_OTHER): Payer: Medicare HMO | Admitting: Internal Medicine

## 2018-10-14 ENCOUNTER — Other Ambulatory Visit: Payer: Self-pay | Admitting: Internal Medicine

## 2018-10-14 DIAGNOSIS — I10 Essential (primary) hypertension: Secondary | ICD-10-CM

## 2018-10-14 DIAGNOSIS — E7800 Pure hypercholesterolemia, unspecified: Secondary | ICD-10-CM | POA: Diagnosis not present

## 2018-10-14 DIAGNOSIS — Z87442 Personal history of urinary calculi: Secondary | ICD-10-CM | POA: Insufficient documentation

## 2018-10-14 DIAGNOSIS — F5101 Primary insomnia: Secondary | ICD-10-CM

## 2018-10-14 DIAGNOSIS — G4700 Insomnia, unspecified: Secondary | ICD-10-CM | POA: Insufficient documentation

## 2018-10-14 DIAGNOSIS — M8589 Other specified disorders of bone density and structure, multiple sites: Secondary | ICD-10-CM

## 2018-10-14 DIAGNOSIS — R69 Illness, unspecified: Secondary | ICD-10-CM | POA: Diagnosis not present

## 2018-10-14 NOTE — Assessment & Plan Note (Signed)
Will set up nurse visit for BP check Continue Lisinopril for now Reinforced DASH diet and exercise for weight loss Will set up lab only appt for CMET

## 2018-10-14 NOTE — Assessment & Plan Note (Signed)
Will set up lab only appt for CMET and Lipid profile Encouraged her to consume a low fat diet Continue Atorvastatin for now.

## 2018-10-14 NOTE — Patient Instructions (Signed)
Fat and Cholesterol Restricted Eating Plan Getting too much fat and cholesterol in your diet may cause health problems. Choosing the right foods helps keep your fat and cholesterol at normal levels. This can keep you from getting certain diseases. Your doctor may recommend an eating plan that includes:  Total fat: ______% or less of total calories a day.  Saturated fat: ______% or less of total calories a day.  Cholesterol: less than _________mg a day.  Fiber: ______g a day. What are tips for following this plan? Meal planning  At meals, divide your plate into four equal parts: ? Fill one-half of your plate with vegetables and green salads. ? Fill one-fourth of your plate with whole grains. ? Fill one-fourth of your plate with low-fat (lean) protein foods.  Eat fish that is high in omega-3 fats at least two times a week. This includes mackerel, tuna, sardines, and salmon.  Eat foods that are high in fiber, such as whole grains, beans, apples, broccoli, carrots, peas, and barley. General tips   Work with your doctor to lose weight if you need to.  Avoid: ? Foods with added sugar. ? Fried foods. ? Foods with partially hydrogenated oils.  Limit alcohol intake to no more than 1 drink a day for nonpregnant women and 2 drinks a day for men. One drink equals 12 oz of beer, 5 oz of wine, or 1 oz of hard liquor. Reading food labels  Check food labels for: ? Trans fats. ? Partially hydrogenated oils. ? Saturated fat (g) in each serving. ? Cholesterol (mg) in each serving. ? Fiber (g) in each serving.  Choose foods with healthy fats, such as: ? Monounsaturated fats. ? Polyunsaturated fats. ? Omega-3 fats.  Choose grain products that have whole grains. Look for the word "whole" as the first word in the ingredient list. Cooking  Cook foods using low-fat methods. These include baking, boiling, grilling, and broiling.  Eat more home-cooked foods. Eat at restaurants and buffets  less often.  Avoid cooking using saturated fats, such as butter, cream, palm oil, palm kernel oil, and coconut oil. Recommended foods  Fruits  All fresh, canned (in natural juice), or frozen fruits. Vegetables  Fresh or frozen vegetables (raw, steamed, roasted, or grilled). Green salads. Grains  Whole grains, such as whole wheat or whole grain breads, crackers, cereals, and pasta. Unsweetened oatmeal, bulgur, barley, quinoa, or brown rice. Corn or whole wheat flour tortillas. Meats and other protein foods  Ground beef (85% or leaner), grass-fed beef, or beef trimmed of fat. Skinless chicken or turkey. Ground chicken or turkey. Pork trimmed of fat. All fish and seafood. Egg whites. Dried beans, peas, or lentils. Unsalted nuts or seeds. Unsalted canned beans. Nut butters without added sugar or oil. Dairy  Low-fat or nonfat dairy products, such as skim or 1% milk, 2% or reduced-fat cheeses, low-fat and fat-free ricotta or cottage cheese, or plain low-fat and nonfat yogurt. Fats and oils  Tub margarine without trans fats. Light or reduced-fat mayonnaise and salad dressings. Avocado. Olive, canola, sesame, or safflower oils. The items listed above may not be a complete list of foods and beverages you can eat. Contact a dietitian for more information. Foods to avoid Fruits  Canned fruit in heavy syrup. Fruit in cream or butter sauce. Fried fruit. Vegetables  Vegetables cooked in cheese, cream, or butter sauce. Fried vegetables. Grains  White bread. White pasta. White rice. Cornbread. Bagels, pastries, and croissants. Crackers and snack foods that contain trans fat   and hydrogenated oils. Meats and other protein foods  Fatty cuts of meat. Ribs, chicken wings, bacon, sausage, bologna, salami, chitterlings, fatback, hot dogs, bratwurst, and packaged lunch meats. Liver and organ meats. Whole eggs and egg yolks. Chicken and turkey with skin. Fried meat. Dairy  Whole or 2% milk, cream,  half-and-half, and cream cheese. Whole milk cheeses. Whole-fat or sweetened yogurt. Full-fat cheeses. Nondairy creamers and whipped toppings. Processed cheese, cheese spreads, and cheese curds. Beverages  Alcohol. Sugar-sweetened drinks such as sodas, lemonade, and fruit drinks. Fats and oils  Butter, stick margarine, lard, shortening, ghee, or bacon fat. Coconut, palm kernel, and palm oils. Sweets and desserts  Corn syrup, sugars, honey, and molasses. Candy. Jam and jelly. Syrup. Sweetened cereals. Cookies, pies, cakes, donuts, muffins, and ice cream. The items listed above may not be a complete list of foods and beverages you should avoid. Contact a dietitian for more information. Summary  Choosing the right foods helps keep your fat and cholesterol at normal levels. This can keep you from getting certain diseases.  At meals, fill one-half of your plate with vegetables and green salads.  Eat high-fiber foods, like whole grains, beans, apples, carrots, peas, and barley.  Limit added sugar, saturated fats, alcohol, and fried foods. This information is not intended to replace advice given to you by your health care provider. Make sure you discuss any questions you have with your health care provider. Document Released: 12/24/2011 Document Revised: 02/25/2018 Document Reviewed: 03/11/2017 Elsevier Interactive Patient Education  2019 [employer]   

## 2018-10-14 NOTE — Assessment & Plan Note (Signed)
None recently Will monitor

## 2018-10-14 NOTE — Assessment & Plan Note (Signed)
Will obtain bone density Continue Calcium and Vit D Encouraged daily weight bearing exercise

## 2018-10-14 NOTE — Progress Notes (Signed)
Virtual Visit via Video Note  I connected with Elon Lom[facility] on 10/14/18 at  3:15 PM EDT by a video enabled telemedicine application and verified that I am speaking with the correct person using two identifiers.   I discussed the limitations of evaluation and management by telemedicine and the availability of in person appointments. The patient expressed understanding and agreed to proceed.  History of Present Illness:  Pt due for follow up of chronic conditions.  HLD: Her last LDL was 79, 09/2017.  She denies myalgias on Atorvastatin. She does not consume a low fat diet.  HTN: She does not monitor her blood pressure at home. She is taking Lisinopril as prescribed. ECG from 2009 reviewed.  Hx of Kidney Stones: None recently.  Osteopenia: She has not had a bone density in > 5 years. She is taking Calcium and Vit D as prescribed. She tries to get 30 minutes of weight bearing exercise daily.   Insomnia: She has trouble falling asleep. She takes Trazadone nightly with good relief.  Observations/Objective:  Alert and oriented NAD Well kempt Judgement and thought content are normal.   Assessment and Plan:  See problem based charting.  Follow Up Instructions:    I discussed the assessment and treatment plan with the patient. The patient was provided an opportunity to ask questions and all were answered. The patient agreed with the plan and demonstrated an understanding of the instructions.   The patient was advised to call back or seek an in-person evaluation if the symptoms worsen or if the condition fails to improve as anticipated.     Webb Silversmith, NP

## 2018-10-14 NOTE — Assessment & Plan Note (Signed)
Continue Trazadone Will monitor

## 2018-10-15 ENCOUNTER — Other Ambulatory Visit: Payer: Self-pay

## 2018-10-15 ENCOUNTER — Ambulatory Visit: Payer: Medicare HMO

## 2018-10-15 ENCOUNTER — Other Ambulatory Visit (INDEPENDENT_AMBULATORY_CARE_PROVIDER_SITE_OTHER): Payer: Medicare HMO

## 2018-10-15 VITALS — BP 132/84

## 2018-10-15 DIAGNOSIS — I10 Essential (primary) hypertension: Secondary | ICD-10-CM | POA: Diagnosis not present

## 2018-10-15 DIAGNOSIS — R69 Illness, unspecified: Secondary | ICD-10-CM | POA: Diagnosis not present

## 2018-10-15 DIAGNOSIS — M8589 Other specified disorders of bone density and structure, multiple sites: Secondary | ICD-10-CM | POA: Diagnosis not present

## 2018-10-15 DIAGNOSIS — E7800 Pure hypercholesterolemia, unspecified: Secondary | ICD-10-CM | POA: Diagnosis not present

## 2018-10-15 DIAGNOSIS — F5101 Primary insomnia: Secondary | ICD-10-CM

## 2018-10-15 LAB — LIPID PANEL
Cholesterol: 136 mg/dL (ref 0–200)
HDL: 46.5 mg/dL (ref >39.00)
LDL Cholesterol: 78 mg/dL (ref 0–99)
NonHDL: 89.56
Total CHOL/HDL Ratio: 3
Triglycerides: 59 mg/dL (ref 0.0–149.0)
VLDL: 11.8 mg/dL (ref 0.0–40.0)

## 2018-10-15 LAB — COMPREHENSIVE METABOLIC PANEL
ALT: 24 U/L (ref 0–35)
AST: 17 U/L (ref 0–37)
Albumin: 3.9 g/dL (ref 3.5–5.2)
Alkaline Phosphatase: 56 U/L (ref 39–117)
BUN: 14 mg/dL (ref 6–23)
CO2: 28 mEq/L (ref 19–32)
Calcium: 9.2 mg/dL (ref 8.4–10.5)
Chloride: 105 mEq/L (ref 96–112)
Creatinine, Ser: 0.9 mg/dL (ref 0.40–1.20)
GFR: 62.41 mL/min (ref >60.00)
Glucose, Bld: 101 mg/dL — ABNORMAL HIGH (ref 70–99)
Potassium: 4.5 mEq/L (ref 3.5–5.1)
Sodium: 140 mEq/L (ref 135–145)
Total Bilirubin: 0.5 mg/dL (ref 0.2–1.2)
Total Protein: 6.9 g/dL (ref 6.0–8.3)

## 2018-10-15 NOTE — Progress Notes (Signed)
Pt came in for a BP Check per [facility]. 132/84 in the right arm. No issues.

## 2018-10-26 ENCOUNTER — Telehealth: Payer: Self-pay

## 2018-10-26 NOTE — Telephone Encounter (Signed)
Left message on voicemail for pt to increase lisinopril to 20mg , she needs to schedule nurse visit in 2 weeks for BP recheck

## 2018-10-26 NOTE — Telephone Encounter (Signed)
Pt returned your call.  She stated she already talked to you last week

## 2018-11-03 ENCOUNTER — Other Ambulatory Visit: Payer: Self-pay

## 2018-11-03 ENCOUNTER — Ambulatory Visit: Payer: Medicare HMO

## 2018-11-03 VITALS — BP 124/78

## 2018-11-03 DIAGNOSIS — I10 Essential (primary) hypertension: Secondary | ICD-10-CM

## 2018-11-03 NOTE — Progress Notes (Signed)
Patient was seen for a nurse visit for a blood pressure check following an increase in Lisinopril from 10mg  to 20mg . She states that the medication is taken at night blood pressure was 124 78

## 2018-11-10 DIAGNOSIS — M5432 Sciatica, left side: Secondary | ICD-10-CM | POA: Diagnosis not present

## 2018-11-10 DIAGNOSIS — M5416 Radiculopathy, lumbar region: Secondary | ICD-10-CM | POA: Diagnosis not present

## 2018-11-10 DIAGNOSIS — M9905 Segmental and somatic dysfunction of pelvic region: Secondary | ICD-10-CM | POA: Diagnosis not present

## 2018-11-10 DIAGNOSIS — M9903 Segmental and somatic dysfunction of lumbar region: Secondary | ICD-10-CM | POA: Diagnosis not present

## 2018-11-16 ENCOUNTER — Ambulatory Visit (INDEPENDENT_AMBULATORY_CARE_PROVIDER_SITE_OTHER): Payer: Medicare HMO | Admitting: Internal Medicine

## 2018-11-16 ENCOUNTER — Encounter: Payer: Self-pay | Admitting: Internal Medicine

## 2018-11-16 DIAGNOSIS — J301 Allergic rhinitis due to pollen: Secondary | ICD-10-CM | POA: Diagnosis not present

## 2018-11-16 MED ORDER — HYDROCODONE-HOMATROPINE 5-1.5 MG/5ML PO SYRP: 5.0000 mL | ORAL_SOLUTION | Freq: Three times a day (TID) | ORAL | 0 refills | Status: DC | PRN

## 2018-11-16 NOTE — Progress Notes (Signed)
Virtual Visit via Video Note  I connected with Jacqueline [name] [facility] on 11/16/18 at  9:45 AM EDT by a video enabled telemedicine application and verified that I am speaking with the correct person using two identifiers.  Location: Patient: Home Provider: Office   I discussed the limitations of evaluation and management by telemedicine and the availability of in person appointments. The patient expressed understanding and agreed to proceed.  History of Present Illness:  Pt reports nasal congestion, cough and chest congestion. This started 5 days ago. She is blowing clear mucous out of her nose. The cough is non productive. Her symptoms seem worse in the morning. She denies runny nose, ear pain, sore throat or shortness of breath. She denies fever, chills, body aches or shortness of breath. She has tried Mucinex and Tylenol with minimal relief. She has no history of allergies. She has not had sick contacts.   Past Medical History:  Diagnosis Date  . Calcium oxalate renal stones   . Hyperlipidemia   . MVP (mitral valve prolapse)     Current Outpatient Medications  Medication Sig Dispense Refill  . atorvastatin (LIPITOR) 10 MG tablet Take 1 tablet (10 mg total) by mouth daily. MUST SCHEDULE ANNUAL PHYSICAL 90 tablet 0  . calcium carbonate (CALCIUM 600) 600 MG TABS tablet Take 1 tablet (600 mg total) by mouth 2 (two) times daily with a meal. 30 tablet 0  . Cholecalciferol (VITAMIN D) 50 MCG (2000 UT) CAPS Take by mouth. 30 capsule   . lisinopril (PRINIVIL,ZESTRIL) 10 MG tablet TAKE 1 TABLET BY MOUTH EVERY DAY 90 tablet 0  . Multiple Vitamin (MULTIVITAMIN) tablet Take 1 tablet by mouth daily.      . traZODone (DESYREL) 50 MG tablet Take 0.5-1 tablets (25-50 mg total) by mouth at bedtime. 90 tablet 1   No current facility-administered medications for this visit.     No Known Allergies  Family History  Problem Relation Age of Onset  . Hypertension Mother   . Stroke Father 42  . Heart  disease Maternal Grandmother   . Heart disease Maternal Grandfather   . Hypertension Paternal Grandmother   . Cancer Maternal Aunt        breast and lung    Social History   Socioeconomic History  . Marital status: Married    Spouse name: Not on file  . Number of children: 2  . Years of education: Not on file  . Highest education level: Not on file  Occupational History  . Occupation: Naval architect  Social Needs  . Financial resource strain: Not on file  . Food insecurity:    Worry: Not on file    Inability: Not on file  . Transportation needs:    Medical: Not on file    Non-medical: Not on file  Tobacco Use  . Smoking status: Former Smoker    Packs/day: 0.25    Years: 30.00    Pack years: 7.50    Types: Cigarettes  . Smokeless tobacco: Never Used  Substance and Sexual Activity  . Alcohol use: Yes    Alcohol/week: 1.0 standard drinks    Types: 1 Glasses of wine per week    Comment: occasional  . Drug use: No  . Sexual activity: Not on file  Lifestyle  . Physical activity:    Days per week: Not on file    Minutes per session: Not on file  . Stress: Not on file  Relationships  . Social connections:  Talks on phone: Not on file    Gets together: Not on file    Attends religious service: Not on file    Active member of club or organization: Not on file    Attends meetings of clubs or organizations: Not on file    Relationship status: Not on file  . Intimate partner violence:    Fear of current or ex partner: Not on file    Emotionally abused: Not on file    Physically abused: Not on file    Forced sexual activity: Not on file  Other Topics Concern  . Not on file  Social History Narrative  . Not on file     Constitutional: Denies fever, malaise, fatigue, headache or abrupt weight changes.  HEENT: Pt reports nasal congestion. Denies eye pain, eye redness, ear pain, ringing in the ears, wax buildup, runny nose, bloody nose, or sore  throat. Respiratory: Pt reports cough. Denies difficulty breathing, shortness of breath, or sputum production.   Cardiovascular: Denies chest pain, chest tightness, palpitations or swelling in the hands or feet.   No other specific complaints in a complete review of systems (except as listed in HPI above).   Wt Readings from Last 3 Encounters:  10/30/17 200 lb 8 oz (90.9 kg)  09/25/17 199 lb (90.3 kg)  07/10/17 200 lb 8 oz (90.9 kg)    General: Appears her stated age, well developed, well nourished in NAD. HEENT: Head: normal shape and size; Eyes: sclera white, EOMs intact;  Pulmonary/Chest: Normal effort. No respiratory distress.  Neurological: Alert and oriented.    BMET    Component Value Date/Time   NA 140 10/15/2018 0904   K 4.5 10/15/2018 0904   CL 105 10/15/2018 0904   CO2 28 10/15/2018 0904   GLUCOSE 101 (H) 10/15/2018 0904   BUN 14 10/15/2018 0904   CREATININE 0.90 10/15/2018 0904   CALCIUM 9.2 10/15/2018 0904   GFRNONAA 73.93 11/21/2009 0915   GFRAA 84 03/11/2007 1057    Lipid Panel     Component Value Date/Time   CHOL 136 10/15/2018 0904   TRIG 59.0 10/15/2018 0904   HDL 46.50 10/15/2018 0904   CHOLHDL 3 10/15/2018 0904   VLDL 11.8 10/15/2018 0904   LDLCALC 78 10/15/2018 0904    CBC    Component Value Date/Time   WBC 7.2 09/25/2017 0916   RBC 4.93 09/25/2017 0916   HGB 15.1 (H) 09/25/2017 0916   HCT 45.4 09/25/2017 0916   PLT 297.0 09/25/2017 0916   MCV 92.1 09/25/2017 0916   MCHC 33.2 09/25/2017 0916   RDW 13.5 09/25/2017 0916   LYMPHSABS 2.3 03/26/2016 0845   MONOABS 0.8 03/26/2016 0845   EOSABS 0.3 03/26/2016 0845   BASOSABS 0.0 03/26/2016 0845    Hgb A1C No results found for: HGBA1C      Assessment and Plan:  Allergic Rhinitis:  Start Allegra and Flonase OTC RX for Hycodan for cough  Return precautions discussed  Follow Up Instructions:    I discussed the assessment and treatment plan with the patient. The patient was  provided an opportunity to ask questions and all were answered. The patient agreed with the plan and demonstrated an understanding of the instructions.   The patient was advised to call back or seek an in-person evaluation if the symptoms worsen or if the condition fails to improve as anticipated.     Webb Silversmith, NP

## 2018-11-16 NOTE — Patient Instructions (Signed)

## 2018-12-01 ENCOUNTER — Other Ambulatory Visit: Payer: Self-pay | Admitting: Internal Medicine

## 2018-12-03 MED ORDER — ATORVASTATIN CALCIUM 10 MG PO TABS: 10.0000 mg | ORAL_TABLET | Freq: Every day | ORAL | 0 refills | Status: DC

## 2018-12-03 MED ORDER — LISINOPRIL 20 MG PO TABS: 20.0000 mg | ORAL_TABLET | Freq: Every day | ORAL | 0 refills | Status: DC

## 2018-12-03 NOTE — Addendum Note (Signed)
Addended by: Lurlean Nanny on: 12/03/2018 03:46 PM   Modules accepted: Orders

## 2018-12-03 NOTE — Telephone Encounter (Signed)
Best number [phone] Pt called checking on her refills for   Lisinopril Atorvastatin  Pt is complete out of both meds [facility]  [facility]

## 2018-12-03 NOTE — Addendum Note (Signed)
Addended by: Lurlean Nanny on: 12/03/2018 03:45 PM   Modules accepted: Orders

## 2018-12-03 NOTE — Telephone Encounter (Signed)
Rx sent through e-scribe  

## 2018-12-08 DIAGNOSIS — M9903 Segmental and somatic dysfunction of lumbar region: Secondary | ICD-10-CM | POA: Diagnosis not present

## 2018-12-08 DIAGNOSIS — M5432 Sciatica, left side: Secondary | ICD-10-CM | POA: Diagnosis not present

## 2018-12-08 DIAGNOSIS — M5416 Radiculopathy, lumbar region: Secondary | ICD-10-CM | POA: Diagnosis not present

## 2018-12-08 DIAGNOSIS — M9905 Segmental and somatic dysfunction of pelvic region: Secondary | ICD-10-CM | POA: Diagnosis not present

## 2018-12-22 ENCOUNTER — Other Ambulatory Visit: Payer: Self-pay

## 2018-12-22 ENCOUNTER — Ambulatory Visit (INDEPENDENT_AMBULATORY_CARE_PROVIDER_SITE_OTHER)
Admission: RE | Admit: 2018-12-22 | Discharge: 2018-12-22 | Disposition: A | Discharge status: Home / Self Care | Payer: Medicare HMO | Source: Ambulatory Visit | Attending: Internal Medicine | Admitting: Internal Medicine

## 2018-12-22 DIAGNOSIS — M8589 Other specified disorders of bone density and structure, multiple sites: Secondary | ICD-10-CM

## 2018-12-22 DIAGNOSIS — E7800 Pure hypercholesterolemia, unspecified: Secondary | ICD-10-CM

## 2018-12-22 DIAGNOSIS — I10 Essential (primary) hypertension: Secondary | ICD-10-CM

## 2018-12-22 DIAGNOSIS — F5101 Primary insomnia: Secondary | ICD-10-CM

## 2018-12-28 ENCOUNTER — Ambulatory Visit (INDEPENDENT_AMBULATORY_CARE_PROVIDER_SITE_OTHER): Payer: Medicare HMO | Admitting: Family Medicine

## 2018-12-28 ENCOUNTER — Other Ambulatory Visit: Payer: Self-pay

## 2018-12-28 ENCOUNTER — Encounter: Payer: Self-pay | Admitting: Family Medicine

## 2018-12-28 VITALS — BP 104/70 | HR 79 | Temp 97.9°F | Ht 66.0 in | Wt 203.0 lb

## 2018-12-28 DIAGNOSIS — N898 Other specified noninflammatory disorders of vagina: Secondary | ICD-10-CM | POA: Diagnosis not present

## 2018-12-28 DIAGNOSIS — R350 Frequency of micturition: Secondary | ICD-10-CM | POA: Diagnosis not present

## 2018-12-28 LAB — POC URINALSYSI DIPSTICK (AUTOMATED)
Bilirubin, UA: NEGATIVE
Blood, UA: NEGATIVE
Glucose, UA: NEGATIVE
Ketones, UA: NEGATIVE
Leukocytes, UA: NEGATIVE
Nitrite, UA: NEGATIVE
Protein, UA: NEGATIVE
Spec Grav, UA: >=1.03 — AB (ref 1.010–1.025)
Urobilinogen, UA: 0.2 E.U./dL
pH, UA: 6 (ref 5.0–8.0)

## 2018-12-28 MED ORDER — SULFAMETHOXAZOLE-TRIMETHOPRIM 800-160 MG PO TABS: 1.0000 | ORAL_TABLET | Freq: Two times a day (BID) | ORAL | 0 refills | Status: DC

## 2018-12-28 NOTE — Progress Notes (Signed)
Subjective:    Patient ID: Jacqueline [name] [facility], female    DOB: December 26, 1950, 68 y.o.   MRN: [id number]  HPI This is a 68 yo female who presents today with two days of urinary frequency, lower abdominal pressure, voiding small amounts. No back pian, no fever, no nausea or vomiting. Some dysuria. No hematuria.  Has had some ongoing vaginal itching, small amount discharge in underwear. Not wearing pads.  Remote history of kidney stones.    Past Medical History:  Diagnosis Date  . Calcium oxalate renal stones   . Hyperlipidemia   . MVP (mitral valve prolapse)    Past Surgical History:  Procedure Laterality Date  . BREAST BIOPSY Right 08/19/12   neg  . extraction of renal stones    . TONSILLECTOMY AND ADENOIDECTOMY    . VAGINAL HYSTERECTOMY     class IV, ovaries intact   Family History  Problem Relation Age of Onset  . Hypertension Mother   . Stroke Father 8  . Heart disease Maternal Grandmother   . Heart disease Maternal Grandfather   . Hypertension Paternal Grandmother   . Cancer Maternal Aunt        breast and lung   Social History   Tobacco Use  . Smoking status: Former Smoker    Packs/day: 0.25    Years: 30.00    Pack years: 7.50    Types: Cigarettes  . Smokeless tobacco: Never Used  Substance Use Topics  . Alcohol use: Yes    Alcohol/week: 1.0 standard drinks    Types: 1 Glasses of wine per week    Comment: occasional  . Drug use: No      Review of Systems Per HPI    Objective:   Physical Exam Physical Exam  Constitutional: She is oriented to person, place, and time. She appears well-developed and well-nourished. No distress.  HENT:  Head: Normocephalic and atraumatic.  Cardiovascular: Normal rate, regular rhythm and normal heart sounds.   Pulmonary/Chest: Effort normal and breath sounds normal.  Abdominal: Soft. She exhibits no distension. There is no tenderness. There is no rebound, no guarding and no CVA tenderness.  Neurological: She is alert and  oriented to person, place, and time.  Skin: Skin is warm and dry. She is not diaphoretic.  Psychiatric: She has a normal mood and affect. Her behavior is normal. Judgment and thought content normal.  Vitals reviewed.     BP 104/70 (BP Location: Left Arm, Patient Position: Sitting, Cuff Size: Large)   Pulse 79   Temp 97.9 F (36.6 C) (Oral)   Ht 5\' 6"  (1.676 m) Comment: per pt  Wt 203 lb (92.1 kg)   SpO2 97%   BMI 32.77 kg/m  Wt Readings from Last 3 Encounters:  12/28/18 203 lb (92.1 kg)  10/30/17 200 lb 8 oz (90.9 kg)  09/25/17 199 lb (90.3 kg)   Results for orders placed or performed in visit on 12/28/18  POCT Urinalysis Dipstick (Automated)  Result Value Ref Range   Color, UA yellow    Clarity, UA clear    Glucose, UA Negative Negative   Bilirubin, UA negative    Ketones, UA negative    Spec Grav, UA >=1.030 (A) 1.010 - 1.025   Blood, UA negative    pH, UA 6.0 5.0 - 8.0   Protein, UA Negative Negative   Urobilinogen, UA 0.2 0.2 or 1.0 E.U./dL   Nitrite, UA negative    Leukocytes, UA Negative Negative  Assessment & Plan:  1. Urinary frequency - urine concentrated, otherwise unremarkable, will go ahead and treat based on symptoms while awaiting culture results. Encouraged her to significantly increase fluids. -  ? could be related to yeast/BV - POCT Urinalysis Dipstick (Automated) - Urine Culture - sulfamethoxazole-trimethoprim (BACTRIM DS) 800-160 MG tablet; Take 1 tablet by mouth 2 (two) times daily.  Dispense: 10 tablet; Refill: 0 - Follow up precautions reviewed  2. Vaginal itching - WET PREP BY MOLECULAR PROBE  3. Vaginal discharge - WET PREP BY MOLECULAR PROBE   Clarene Reamer, FNP-BC  [facility] [place] [facility]  12/28/2018 2:31 PM

## 2018-12-28 NOTE — Patient Instructions (Signed)
Good to see you today  I have sent in a prescription for an antibiotic based on your symptoms. I will notify you of your culture results and vaginal swab results in a couple of days.   Please be in touch if you develop fever over 100, vomiting or severe pain.

## 2018-12-29 ENCOUNTER — Ambulatory Visit
Admission: RE | Admit: 2018-12-29 | Discharge: 2018-12-29 | Disposition: A | Discharge status: Home / Self Care | Payer: Medicare HMO | Source: Ambulatory Visit | Attending: Internal Medicine | Admitting: Internal Medicine

## 2018-12-29 DIAGNOSIS — Z1231 Encounter for screening mammogram for malignant neoplasm of breast: Secondary | ICD-10-CM | POA: Diagnosis not present

## 2018-12-29 LAB — WET PREP BY MOLECULAR PROBE
Candida species: NOT DETECTED
Gardnerella vaginalis: NOT DETECTED
MICRO NUMBER:: 593511
SPECIMEN QUALITY:: ADEQUATE
Trichomonas vaginosis: NOT DETECTED

## 2018-12-29 LAB — URINE CULTURE
MICRO NUMBER:: 593512
SPECIMEN QUALITY:: ADEQUATE

## 2019-01-06 DIAGNOSIS — M9905 Segmental and somatic dysfunction of pelvic region: Secondary | ICD-10-CM | POA: Diagnosis not present

## 2019-01-06 DIAGNOSIS — M5432 Sciatica, left side: Secondary | ICD-10-CM | POA: Diagnosis not present

## 2019-01-06 DIAGNOSIS — M9903 Segmental and somatic dysfunction of lumbar region: Secondary | ICD-10-CM | POA: Diagnosis not present

## 2019-01-06 DIAGNOSIS — M5416 Radiculopathy, lumbar region: Secondary | ICD-10-CM | POA: Diagnosis not present

## 2019-02-11 DIAGNOSIS — M9903 Segmental and somatic dysfunction of lumbar region: Secondary | ICD-10-CM | POA: Diagnosis not present

## 2019-02-11 DIAGNOSIS — M5416 Radiculopathy, lumbar region: Secondary | ICD-10-CM | POA: Diagnosis not present

## 2019-02-11 DIAGNOSIS — M9905 Segmental and somatic dysfunction of pelvic region: Secondary | ICD-10-CM | POA: Diagnosis not present

## 2019-02-11 DIAGNOSIS — M5432 Sciatica, left side: Secondary | ICD-10-CM | POA: Diagnosis not present

## 2019-02-27 ENCOUNTER — Other Ambulatory Visit: Payer: Self-pay | Admitting: Internal Medicine

## 2019-03-02 ENCOUNTER — Other Ambulatory Visit: Payer: Self-pay | Admitting: Internal Medicine

## 2019-03-11 DIAGNOSIS — M5432 Sciatica, left side: Secondary | ICD-10-CM | POA: Diagnosis not present

## 2019-03-11 DIAGNOSIS — M9903 Segmental and somatic dysfunction of lumbar region: Secondary | ICD-10-CM | POA: Diagnosis not present

## 2019-03-11 DIAGNOSIS — M9905 Segmental and somatic dysfunction of pelvic region: Secondary | ICD-10-CM | POA: Diagnosis not present

## 2019-03-11 DIAGNOSIS — M5416 Radiculopathy, lumbar region: Secondary | ICD-10-CM | POA: Diagnosis not present

## 2019-03-22 ENCOUNTER — Other Ambulatory Visit: Payer: Self-pay

## 2019-03-22 DIAGNOSIS — Z20822 Contact with and (suspected) exposure to covid-19: Secondary | ICD-10-CM

## 2019-03-22 DIAGNOSIS — R6889 Other general symptoms and signs: Secondary | ICD-10-CM | POA: Diagnosis not present

## 2019-03-23 LAB — NOVEL CORONAVIRUS, NAA: SARS-CoV-2, NAA: NOT DETECTED

## 2019-03-24 ENCOUNTER — Ambulatory Visit (INDEPENDENT_AMBULATORY_CARE_PROVIDER_SITE_OTHER): Payer: Medicare HMO | Admitting: Family Medicine

## 2019-03-24 ENCOUNTER — Encounter: Payer: Self-pay | Admitting: Family Medicine

## 2019-03-24 ENCOUNTER — Other Ambulatory Visit (INDEPENDENT_AMBULATORY_CARE_PROVIDER_SITE_OTHER): Payer: Medicare HMO

## 2019-03-24 VITALS — Temp 99.0°F | Ht 66.0 in | Wt 195.0 lb

## 2019-03-24 DIAGNOSIS — R509 Fever, unspecified: Secondary | ICD-10-CM | POA: Diagnosis not present

## 2019-03-24 NOTE — Progress Notes (Signed)
Virtual Visit via Video Note  I connected with [name] [facility] on 03/24/19 at  2:00 PM EDT by a video enabled telemedicine application and verified that I am speaking with Jacqueline correct person using two identifiers.  Location: Patient: In her home Provider: [place]   I discussed Jacqueline limitations of evaluation and management by telemedicine and Jacqueline availability of in person appointments. Jacqueline patient expressed understanding and agreed to proceed.  History of Present Illness: Chief Complaint  Patient presents with  . Fever    x 10days, ranging 99-101.8 (orally). Pt states that she feels bad. Feels okay in AM but when fever started setting in later in Jacqueline afternoon she starts feeling worse. No other symptoms. Covid test NEG.  This is a 68 yo female who requests virtual visit for Jacqueline above cc.  Patient reports fevers that started in Jacqueline late afternoon/early evening for approximately 10 days.  They have ranged from 99 to 1-1.8.  Approximately September 4 she had what she suspects was a bug bite on her toe.  She had some localized redness, swelling and what sounds like a pustule.  She used a needle to lance this and it drained some pus.  She reports that she had complete resolution of this issue.  Around that time she had a fever for 3-4 nights and then it resolved for several nights.  She has had about an 8 pound weight loss in Jacqueline last several months but she attributes this to watching her diet more carefully and insists that this is intentional. She has a few body aches as Jacqueline fever comes on in Jacqueline evening.  She has been taking 2 extra strength Tylenol in Jacqueline evening.  Her fever resolves with this and she also has sweating requiring changing her pajamas.  She has had a negative COVID test since her symptoms started. She has had a little headache in Jacqueline mornings but not anything that is unusual for her. She denies visual changes, nasal drainage, sore throat, ear pain, cough, shortness of  breath, palpitations, fatigue, hematuria, urine frequency, abdominal pain, nausea, diarrhea.   Past Medical History:  Diagnosis Date  . Calcium oxalate renal stones   . Hyperlipidemia   . MVP (mitral valve prolapse)    Past Surgical History:  Procedure Laterality Date  . BREAST BIOPSY Right 08/19/12   neg  . extraction of renal stones    . TONSILLECTOMY AND ADENOIDECTOMY    . VAGINAL HYSTERECTOMY     class IV, ovaries intact   Family History  Problem Relation Age of Onset  . Hypertension Mother   . Stroke Father 52  . Heart disease Maternal Grandmother   . Heart disease Maternal Grandfather   . Hypertension Paternal Grandmother   . Cancer Maternal Aunt        breast and lung      Observations/Objective: Jacqueline patient is alert and answers questions appropriately.  Visible skin is unremarkable.  She is normally conversive without audible wheeze, shortness of breath or witnessed cough.  Her mood and affect are appropriate. Temp 99 F (37.2 C) (Oral)   Ht 5\' 6"  (1.676 m)   Wt 185 lb (83.9 kg)   BMI 29.86 kg/m  Wt Readings from Last 3 Encounters:  03/24/19 195 lb (88.5 kg)  12/28/18 203 lb (92.1 kg)  10/30/17 200 lb 8 oz (90.9 kg)     Assessment and Plan: 1. Intermittent FUO -Unclear etiology, she will come in today to have labs drawn -  She was instructed to notify me of any new symptoms - CBC with Differential; Future - Sedimentation rate; Future - Comprehensive metabolic panel; Future - High sensitivity CRP; Future - Urine Culture; Future   Clarene Reamer, FNP-BC  [place] [facility] [place] [facility]  03/26/2019 7:36 AM   Follow Up Instructions: Visit recap sent via my chart   I discussed Jacqueline assessment and treatment plan with Jacqueline patient. Jacqueline patient was provided an opportunity to ask questions and all were answered. Jacqueline patient agreed with Jacqueline plan and demonstrated an understanding of Jacqueline instructions.   Jacqueline patient was advised  to call back or seek an in-person evaluation if Jacqueline symptoms worsen or if Jacqueline condition fails to improve as anticipated.   Elby Beck, FNP

## 2019-03-25 ENCOUNTER — Encounter: Payer: Self-pay | Admitting: Family Medicine

## 2019-03-25 LAB — CBC WITH DIFFERENTIAL/PLATELET
Basophils Absolute: 0.2 10*3/uL — ABNORMAL HIGH (ref 0.0–0.1)
Basophils Relative: 3.6 % — ABNORMAL HIGH (ref 0.0–3.0)
Eosinophils Absolute: 0.3 10*3/uL (ref 0.0–0.7)
Eosinophils Relative: 5.9 % — ABNORMAL HIGH (ref 0.0–5.0)
HCT: 39.1 % (ref 36.0–46.0)
Hemoglobin: 13 g/dL (ref 12.0–15.0)
Lymphocytes Relative: 28.1 % (ref 12.0–46.0)
Lymphs Abs: 1.6 10*3/uL (ref 0.7–4.0)
MCHC: 33.3 g/dL (ref 30.0–36.0)
MCV: 91.6 fl (ref 78.0–100.0)
Monocytes Absolute: 0.8 10*3/uL (ref 0.1–1.0)
Monocytes Relative: 14.5 % — ABNORMAL HIGH (ref 3.0–12.0)
Neutro Abs: 2.7 10*3/uL (ref 1.4–7.7)
Neutrophils Relative %: 47.9 % (ref 43.0–77.0)
Platelets: 210 10*3/uL (ref 150.0–400.0)
RBC: 4.27 Mil/uL (ref 3.87–5.11)
RDW: 13.4 % (ref 11.5–15.5)
WBC: 5.7 10*3/uL (ref 4.0–10.5)

## 2019-03-25 LAB — COMPREHENSIVE METABOLIC PANEL
ALT: 33 U/L (ref 0–35)
AST: 24 U/L (ref 0–37)
Albumin: 3.6 g/dL (ref 3.5–5.2)
Alkaline Phosphatase: 56 U/L (ref 39–117)
BUN: 17 mg/dL (ref 6–23)
CO2: 28 mEq/L (ref 19–32)
Calcium: 8.8 mg/dL (ref 8.4–10.5)
Chloride: 104 mEq/L (ref 96–112)
Creatinine, Ser: 0.88 mg/dL (ref 0.40–1.20)
GFR: 63.97 mL/min (ref >60.00)
Glucose, Bld: 156 mg/dL — ABNORMAL HIGH (ref 70–99)
Potassium: 4.1 mEq/L (ref 3.5–5.1)
Sodium: 138 mEq/L (ref 135–145)
Total Bilirubin: 0.5 mg/dL (ref 0.2–1.2)
Total Protein: 6.6 g/dL (ref 6.0–8.3)

## 2019-03-25 LAB — SEDIMENTATION RATE: Sed Rate: 16 mm/hr (ref 0–30)

## 2019-03-25 LAB — HIGH SENSITIVITY CRP: CRP, High Sensitivity: 5.95 mg/L — ABNORMAL HIGH (ref 0.000–5.000)

## 2019-03-26 ENCOUNTER — Encounter: Payer: Self-pay | Admitting: Family Medicine

## 2019-03-26 ENCOUNTER — Other Ambulatory Visit: Payer: Self-pay | Admitting: Internal Medicine

## 2019-03-26 ENCOUNTER — Other Ambulatory Visit (INDEPENDENT_AMBULATORY_CARE_PROVIDER_SITE_OTHER): Payer: Medicare HMO

## 2019-03-26 DIAGNOSIS — R7309 Other abnormal glucose: Secondary | ICD-10-CM | POA: Diagnosis not present

## 2019-03-26 LAB — HEMOGLOBIN A1C: Hgb A1c MFr Bld: 6.6 % — ABNORMAL HIGH (ref 4.6–6.5)

## 2019-03-26 LAB — URINE CULTURE
MICRO NUMBER:: 888431
Result:: NO GROWTH
SPECIMEN QUALITY:: ADEQUATE

## 2019-03-26 MED ORDER — AMOXICILLIN-POT CLAVULANATE 875-125 MG PO TABS: 1.0000 | ORAL_TABLET | Freq: Two times a day (BID) | ORAL | 0 refills | Status: DC

## 2019-03-29 ENCOUNTER — Other Ambulatory Visit: Payer: Self-pay | Admitting: Family Medicine

## 2019-03-29 DIAGNOSIS — R509 Fever, unspecified: Secondary | ICD-10-CM

## 2019-04-01 ENCOUNTER — Encounter: Payer: Self-pay | Admitting: Internal Medicine

## 2019-04-01 ENCOUNTER — Encounter: Payer: Self-pay | Admitting: Infectious Diseases

## 2019-04-01 ENCOUNTER — Ambulatory Visit: Payer: Medicare HMO | Attending: Infectious Diseases | Admitting: Infectious Diseases

## 2019-04-01 ENCOUNTER — Ambulatory Visit (INDEPENDENT_AMBULATORY_CARE_PROVIDER_SITE_OTHER): Payer: Medicare HMO | Admitting: Internal Medicine

## 2019-04-01 ENCOUNTER — Other Ambulatory Visit: Payer: Self-pay

## 2019-04-01 VITALS — BP 106/71 | HR 102 | Temp 98.4°F | Wt 202.0 lb

## 2019-04-01 VITALS — BP 107/63 | Temp 98.6°F

## 2019-04-01 DIAGNOSIS — I341 Nonrheumatic mitral (valve) prolapse: Secondary | ICD-10-CM | POA: Diagnosis not present

## 2019-04-01 DIAGNOSIS — Z87891 Personal history of nicotine dependence: Secondary | ICD-10-CM

## 2019-04-01 DIAGNOSIS — E119 Type 2 diabetes mellitus without complications: Secondary | ICD-10-CM | POA: Diagnosis not present

## 2019-04-01 DIAGNOSIS — Z87442 Personal history of urinary calculi: Secondary | ICD-10-CM | POA: Diagnosis not present

## 2019-04-01 DIAGNOSIS — Z792 Long term (current) use of antibiotics: Secondary | ICD-10-CM

## 2019-04-01 DIAGNOSIS — R509 Fever, unspecified: Secondary | ICD-10-CM | POA: Diagnosis not present

## 2019-04-01 DIAGNOSIS — R7982 Elevated C-reactive protein (CRP): Secondary | ICD-10-CM | POA: Diagnosis not present

## 2019-04-01 NOTE — Patient Instructions (Addendum)
You have fever for past 3 weeks- clinically theres is no localizing finding. You are on antibiotic now- stop it and we will do blood work on Monday including blood culture.please monitor your temp 4 times a day and bring the log book next visit.if your symptoms change please call me or if severe go to ED  Will follow up after that.

## 2019-04-01 NOTE — Patient Instructions (Signed)

## 2019-04-01 NOTE — Progress Notes (Signed)
Virtual Visit via Video Note  I connected with Utha Frede [facility] on 04/01/19 at  8:30 AM EDT by a video enabled telemedicine application and verified that I am speaking with the correct person using two identifiers.  Location: Patient: Home Provider: Office   I discussed the limitations of evaluation and management by telemedicine and the availability of in person appointments. The patient expressed understanding and agreed to proceed.  History of Present Illness:  Pt needs to follow up recent labs ordered by Tor Netters, NP. CRP of 5, having URI symptoms and fevers. COVID test was negative. Has been referred to ID for further evaluation.  Also recent A1C of 6.6%. She has never been told that she has diabetes. She does not check her sugars. She denies visual changes, increased thirst, urinary frequency, numbness or tingling in her feet or non healing wounds. She has no family history of diabetes.    Past Medical History:  Diagnosis Date  . Calcium oxalate renal stones   . Hyperlipidemia   . MVP (mitral valve prolapse)     Current Outpatient Medications  Medication Sig Dispense Refill  . amoxicillin-clavulanate (AUGMENTIN) 875-125 MG tablet Take 1 tablet by mouth 2 (two) times daily. 20 tablet 0  . atorvastatin (LIPITOR) 10 MG tablet TAKE 1 TABLET (10 MG TOTAL) BY MOUTH DAILY. MUST SCHEDULE ANNUAL PHYSICAL 90 tablet 0  . calcium carbonate (CALCIUM 600) 600 MG TABS tablet Take 1 tablet (600 mg total) by mouth 2 (two) times daily with a meal. 30 tablet 0  . Cholecalciferol (VITAMIN D) 50 MCG (2000 UT) CAPS Take by mouth. 30 capsule   . lisinopril (ZESTRIL) 20 MG tablet Take 1 tablet (20 mg total) by mouth daily. 90 tablet 0  . Multiple Vitamin (MULTIVITAMIN) tablet Take 1 tablet by mouth daily.      . traZODone (DESYREL) 50 MG tablet Take 0.5-1 tablets (25-50 mg total) by mouth at bedtime. 90 tablet 1   No current facility-administered medications for this visit.     No Known  Allergies  Family History  Problem Relation Age of Onset  . Hypertension Mother   . Stroke Father 27  . Heart disease Maternal Grandmother   . Heart disease Maternal Grandfather   . Hypertension Paternal Grandmother   . Cancer Maternal Aunt        breast and lung    Social History   Socioeconomic History  . Marital status: Married    Spouse name: Not on file  . Number of children: 2  . Years of education: Not on file  . Highest education level: Not on file  Occupational History  . Occupation: Naval architect  Social Needs  . Financial resource strain: Not on file  . Food insecurity    Worry: Not on file    Inability: Not on file  . Transportation needs    Medical: Not on file    Non-medical: Not on file  Tobacco Use  . Smoking status: Former Smoker    Packs/day: 0.25    Years: 30.00    Pack years: 7.50    Types: Cigarettes  . Smokeless tobacco: Never Used  Substance and Sexual Activity  . Alcohol use: Yes    Alcohol/week: 1.0 standard drinks    Types: 1 Glasses of wine per week    Comment: occasional  . Drug use: No  . Sexual activity: Not on file  Lifestyle  . Physical activity    Days per week: Not on file  Minutes per session: Not on file  . Stress: Not on file  Relationships  . Social Herbalist on phone: Not on file    Gets together: Not on file    Attends religious service: Not on file    Active member of club or organization: Not on file    Attends meetings of clubs or organizations: Not on file    Relationship status: Not on file  . Intimate partner violence    Fear of current or ex partner: Not on file    Emotionally abused: Not on file    Physically abused: Not on file    Forced sexual activity: Not on file  Other Topics Concern  . Not on file  Social History Narrative  . Not on file     Constitutional: Denies fever, malaise, fatigue, headache or abrupt weight changes.  HEENT: Denies eye pain, eye redness, ear pain,  ringing in the ears, wax buildup, runny nose, nasal congestion, bloody nose, or sore throat. Respiratory: Denies difficulty breathing, shortness of breath, cough or sputum production.   Cardiovascular: Denies chest pain, chest tightness, palpitations or swelling in the hands or feet.  Gastrointestinal: Denies abdominal pain, bloating, constipation, diarrhea or blood in the stool.  GU: Denies urgency, frequency, pain with urination, burning sensation, blood in urine, odor or discharge. Skin: Denies redness, rashes, lesions or ulcercations.  Neurological: Denies dizziness, difficulty with memory, difficulty with speech or problems with balance and coordination.   No other specific complaints in a complete review of systems (except as listed in HPI above).  Observations/Objective:  Wt Readings from Last 3 Encounters:  03/24/19 195 lb (88.5 kg)  12/28/18 203 lb (92.1 kg)  10/30/17 200 lb 8 oz (90.9 kg)    General: Appears her stated age, well developed, well nourished in NAD. Skin: No ulcerations noted of BLE. Pulmonary/Chest: Normal effort. No respiratory distress.  Neurological: Alert and oriented.  BMET    Component Value Date/Time   NA 138 03/24/2019 1500   K 4.1 03/24/2019 1500   CL 104 03/24/2019 1500   CO2 28 03/24/2019 1500   GLUCOSE 156 (H) 03/24/2019 1500   BUN 17 03/24/2019 1500   CREATININE 0.88 03/24/2019 1500   CALCIUM 8.8 03/24/2019 1500   GFRNONAA 73.93 11/21/2009 0915   GFRAA 84 03/11/2007 1057    Lipid Panel     Component Value Date/Time   CHOL 136 10/15/2018 0904   TRIG 59.0 10/15/2018 0904   HDL 46.50 10/15/2018 0904   CHOLHDL 3 10/15/2018 0904   VLDL 11.8 10/15/2018 0904   LDLCALC 78 10/15/2018 0904    CBC    Component Value Date/Time   WBC 5.7 03/24/2019 1500   RBC 4.27 03/24/2019 1500   HGB 13.0 03/24/2019 1500   HCT 39.1 03/24/2019 1500   PLT 210.0 03/24/2019 1500   MCV 91.6 03/24/2019 1500   MCHC 33.3 03/24/2019 1500   RDW 13.4  03/24/2019 1500   LYMPHSABS 1.6 03/24/2019 1500   MONOABS 0.8 03/24/2019 1500   EOSABS 0.3 03/24/2019 1500   BASOSABS 0.2 (H) 03/24/2019 1500    Hgb A1C Lab Results  Component Value Date   HGBA1C 6.6 (H) 03/26/2019        Assessment and Plan:  New Onset DM 2:  Discussed diabetes and standards of medical care She wants to hold off on medication at this time Discussed lifestyle changes, low carb diet, exercise for weight loss She declines referral to diabetes education and  nutrition Discussed need for flu and pneumonia vaccines- she is not feeling well today, will get at next visit Discussed importance of eye exam, foot exam  Elevated CRP, Fevers:  She has been referred to ID  If they do not repeat CRP, will have her schedule lab only appt for 1 week for repeat CRP   Follow Up Instructions:    I discussed the assessment and treatment plan with the patient. The patient was provided an opportunity to ask questions and all were answered. The patient agreed with the plan and demonstrated an understanding of the instructions.   The patient was advised to call back or seek an in-person evaluation if the symptoms worsen or if the condition fails to improve as anticipated.    Webb Silversmith, NP

## 2019-04-01 NOTE — Progress Notes (Signed)
NAME: Jalayiah Bibian [facility]  DOB: [birth year]  MRN: [id number]  Date/Time: 04/01/2019 10:22 AM  REQUESTING PROVIDER: Webb Silversmith Subjective:  REASON FOR CONSULT: FUO ? Stefanee Mckell [facility] is a 68 y.o.female with history of renal stones presents with fever for 3 weeks duration. 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  And in the morning she would get headache and dizziness. 2 days later she noted on her left fifth toe a small boil-like lesion which she used depends soaked in alcohol and opened it and release some pus and that healed after that.  The fever continued.  It was not accompanied by sore throat or cough or shortness of breath or runny nose or nasal congestion or diarrhea or abdominal pain or dysuria or incontinence or hematuria or joint pain or skin rash.  She has good appetite.  She has not lost any weight.  She had checked her temperature couple of times and it was 101 once but predominantly around 99 when she will take a Tylenol.   she did not have an obvious tick bite or insect bite.  But she does work in the yard a lot.  [place] continued to go to work on a limited hour basis. She lives with her husband and takes care every day for 2 hours her grandchild who is 67 years old. She also has an 50-monthgreat grandson whom she takes care of once a week.  None of the family numbers have been sick. She has no travel history other than going to the beach. She has a 67year-old lab.  No animal bites. No water sports or exploring caves. She had a virtual visit with her PCP on 03/24/2019 and was given amoxicillin clavulanate.  Some blood work was sent.  Urine culture was negative, WBC was normal but had in the differential 15% monocytes, 5% eosinophils . LFTs were normal.  Renal function normal.  ESR was  15. She has a history of mitral valve prolapse which was 2 she was told many years ago but is not been giving her any problems and I do not see any echo done recently. She has not been to the dentist in 5 years.  But she does not have any tooth pain currently.  Past Medical History:  Diagnosis Date  . Calcium oxalate renal stones   . Hyperlipidemia   . MVP (mitral valve prolapse)     Past Surgical History:  Procedure Laterality Date  . BREAST BIOPSY Right 08/19/12   neg  . extraction of renal stones    . TONSILLECTOMY AND ADENOIDECTOMY    . VAGINAL HYSTERECTOMY     class IV, ovaries intact    Social History   Socioeconomic History  . Marital status: Married    Spouse name: Not on file  . Number of children: 2  . Years of education: Not on file  . Highest education level: Not on file  Occupational History  . Occupation: [employer] architect Social Needs  . Financial resource strain: Not on file  . Food insecurity    Worry: Not on file    Inability: Not on file  . Transportation needs    Medical: Not on file    Non-medical: Not on file  Tobacco Use  . Smoking status: Former Smoker  Packs/day: 0.25    Years: 30.00    Pack years: 7.50    Types: Cigarettes  . Smokeless tobacco: Never Used  Substance and Sexual Activity  . Alcohol use: Yes    Alcohol/week: 1.0 standard drinks    Types: 1 Glasses of wine per week    Comment: occasional  . Drug use: No  . Sexual activity: Not on file  Lifestyle  . Physical activity    Days per week: Not on file    Minutes per session: Not on file  . Stress: Not on file  Relationships  . Social Herbalist on phone: Not on file    Gets together: Not on file    Attends religious service: Not on file    Active member of club or organization: Not on file    Attends meetings of clubs or organizations: Not on file    Relationship status: Not on file  . Intimate partner violence    Fear of current or ex partner: Not on  file    Emotionally abused: Not on file    Physically abused: Not on file    Forced sexual activity: Not on file  Other Topics Concern  . Not on file  Social History Narrative  . Not on file    Family History  Problem Relation Age of Onset  . Hypertension Mother   . Stroke Father 51  . Heart disease Maternal Grandmother   . Heart disease Maternal Grandfather   . Hypertension Paternal Grandmother   . Cancer Maternal Aunt        breast and lung   No Known Allergies I ? Current Outpatient Medications  Medication Sig Dispense Refill  . amoxicillin-clavulanate (AUGMENTIN) 875-125 MG tablet Take 1 tablet by mouth 2 (two) times daily. 20 tablet 0  . atorvastatin (LIPITOR) 10 MG tablet TAKE 1 TABLET (10 MG TOTAL) BY MOUTH DAILY. MUST SCHEDULE ANNUAL PHYSICAL 90 tablet 0  . calcium carbonate (CALCIUM 600) 600 MG TABS tablet Take 1 tablet (600 mg total) by mouth 2 (two) times daily with a meal. 30 tablet 0  . Cholecalciferol (VITAMIN D) 50 MCG (2000 UT) CAPS Take by mouth. 30 capsule   . lisinopril (ZESTRIL) 20 MG tablet Take 1 tablet (20 mg total) by mouth daily. 90 tablet 0  . Multiple Vitamin (MULTIVITAMIN) tablet Take 1 tablet by mouth daily.      . traZODone (DESYREL) 50 MG tablet Take 0.5-1 tablets (25-50 mg total) by mouth at bedtime. 90 tablet 1   No current facility-administered medications for this visit.      Abtx:  Anti-infectives (From admission, onward)   None      REVIEW OF SYSTEMS:  Const: fever, chills, negative weight loss Eyes: negative diplopia or visual changes, negative eye pain ENT: negative coryza, negative sore throat Resp: negative cough, hemoptysis, dyspnea Cards: negative for chest pain, palpitations, lower extremity edema GU: negative for frequency, dysuria and hematuria GI: Negative for abdominal pain, diarrhea, bleeding, constipation Skin: negative for rash and pruritus Heme: negative for easy bruising and gum/nose bleeding MS: negative for  myalgias, arthralgias, back pain and muscle weakness Neurolo:negative for headaches, dizziness, vertigo, memory problems  Psych: negative for feelings of anxiety, depression  Endocrine: negative for thyroid, diabetes Allergy/Immunology- negative for any medication or food allergies ? Objective:  VITALS:  BP 106/71 (BP Location: Right Arm, Patient Position: Sitting, Cuff Size: Large)   Pulse (!) 102   Temp 98.4 F (36.9 C) (  Oral)   Wt 202 lb (91.6 kg)   BMI 32.60 kg/m  PHYSICAL EXAM:  General: Alert, cooperative, no distress, appears stated age.  Head: Normocephalic, without obvious abnormality, atraumatic. Eyes: Conjunctivae clear, anicteric sclerae. Pupils are equal ENT Nares normal. No drainage or sinus tenderness. Lips, mucosa, and tongue normal. No Thrush Dentition poor Neck: Supple, symmetrical, no adenopathy, thyroid: non tender no carotid bruit and no JVD. Back: No CVA tenderness. Lungs: Clear to auscultation bilaterally. No Wheezing or Rhonchi. No rales. Heart: Regular rate and rhythm, no murmur, rub or gallop. Abdomen: Soft, non-tender,not distended. Bowel sounds normal. No masses Extremities: atraumatic, no cyanosis. No edema. No clubbing Skin: No rashes or lesions. Or bruising Lymph: Cervical, supraclavicular normal. Neurologic: Grossly non-focal Pertinent Labs Lab Results CBC    Component Value Date/Time   WBC 5.7 03/24/2019 1500   RBC 4.27 03/24/2019 1500   HGB 13.0 03/24/2019 1500   HCT 39.1 03/24/2019 1500   PLT 210.0 03/24/2019 1500   MCV 91.6 03/24/2019 1500   MCHC 33.3 03/24/2019 1500   RDW 13.4 03/24/2019 1500   LYMPHSABS 1.6 03/24/2019 1500   MONOABS 0.8 03/24/2019 1500   EOSABS 0.3 03/24/2019 1500   BASOSABS 0.2 (H) 03/24/2019 1500    CMP Latest Ref Rng & Units 03/24/2019 10/15/2018 09/25/2017  Glucose 70 - 99 mg/dL 156(H) 101(H) 97  BUN 6 - 23 mg/dL 17 14 13   Creatinine 0.40 - 1.20 mg/dL 0.88 0.90 0.94  Sodium 135 - 145 mEq/L 138 140 141   Potassium 3.5 - 5.1 mEq/L 4.1 4.5 4.9  Chloride 96 - 112 mEq/L 104 105 105  CO2 19 - 32 mEq/L 28 28 22   Calcium 8.4 - 10.5 mg/dL 8.8 9.2 9.7  Total Protein 6.0 - 8.3 g/dL 6.6 6.9 7.6  Total Bilirubin 0.2 - 1.2 mg/dL 0.5 0.5 0.4  Alkaline Phos 39 - 117 U/L 56 56 64  AST 0 - 37 U/L 24 17 18   ALT 0 - 35 U/L 33 24 23      Microbiology: Recent Results (from the past 240 hour(s))  Urine Culture     Status: None   Collection Time: 03/24/19  3:00 PM   Specimen: Urine  Result Value Ref Range Status   MICRO NUMBER: [id number]  Final   SPECIMEN QUALITY: Adequate  Final   Sample Source URINE  Final   STATUS: FINAL  Final   Result: No Growth  Final    IMAGING RESULTS:  None ? Impression/Recommendation ?Fever of unknown origin.  Patient does not have any other localizing signs or symptoms.  She only has headache and dizziness in the morning.  ESR is normal. Unclear etiology for fever Could be infectious versus noninfectious cause. If it is infection it could be viral.  She was tested negative for COVID.  As she is currently on oral antibiotics I have asked her to stop it and we will do blood culture and other tests including LDH, ferritin, CMV antibodies, SARS-CoV-2 antibodies, QuantiFERON gold, Ehrlichia antibodies, peripheral smear, chest x-ray early next week. With a history of mitral valve prolapse even though I am not able to hear a murmur would consider endocarditis if blood culture is positive or fever persist and may get a 2D echo. I have asked her to maintain a temperature log off Tylenol and bring it back next week when I see her ? ?Discussed the management with the patient in great detail.   Note:  This document was prepared using Set designer  software and may include unintentional dictation errors.

## 2019-04-05 ENCOUNTER — Ambulatory Visit
Admission: RE | Admit: 2019-04-05 | Discharge: 2019-04-05 | Disposition: A | Discharge status: Home / Self Care | Payer: Medicare HMO | Source: Ambulatory Visit | Attending: Infectious Diseases | Admitting: Infectious Diseases

## 2019-04-05 ENCOUNTER — Other Ambulatory Visit
Admission: RE | Admit: 2019-04-05 | Discharge: 2019-04-05 | Disposition: A | Discharge status: Home / Self Care | Payer: Medicare HMO | Source: Ambulatory Visit | Attending: Infectious Diseases | Admitting: Infectious Diseases

## 2019-04-05 DIAGNOSIS — R509 Fever, unspecified: Secondary | ICD-10-CM | POA: Diagnosis not present

## 2019-04-05 DIAGNOSIS — Z0184 Encounter for antibody response examination: Secondary | ICD-10-CM | POA: Insufficient documentation

## 2019-04-05 DIAGNOSIS — R5383 Other fatigue: Secondary | ICD-10-CM | POA: Diagnosis not present

## 2019-04-05 DIAGNOSIS — R0602 Shortness of breath: Secondary | ICD-10-CM | POA: Diagnosis not present

## 2019-04-05 LAB — COMPREHENSIVE METABOLIC PANEL
ALT: 56 U/L — ABNORMAL HIGH (ref 0–44)
AST: 43 U/L — ABNORMAL HIGH (ref 15–41)
Albumin: 3.4 g/dL — ABNORMAL LOW (ref 3.5–5.0)
Alkaline Phosphatase: 50 U/L (ref 38–126)
Anion gap: 7 (ref 5–15)
BUN: 15 mg/dL (ref 8–23)
CO2: 27 mmol/L (ref 22–32)
Calcium: 9.1 mg/dL (ref 8.9–10.3)
Chloride: 100 mmol/L (ref 98–111)
Creatinine, Ser: 1.03 mg/dL — ABNORMAL HIGH (ref 0.44–1.00)
GFR calc Af Amer: >60 mL/min (ref >60)
GFR calc non Af Amer: 56 mL/min — ABNORMAL LOW (ref >60)
Glucose, Bld: 117 mg/dL — ABNORMAL HIGH (ref 70–99)
Potassium: 4.6 mmol/L (ref 3.5–5.1)
Sodium: 134 mmol/L — ABNORMAL LOW (ref 135–145)
Total Bilirubin: 0.8 mg/dL (ref 0.3–1.2)
Total Protein: 7.2 g/dL (ref 6.5–8.1)

## 2019-04-05 LAB — URINALYSIS, ROUTINE W REFLEX MICROSCOPIC
Bilirubin Urine: NEGATIVE
Glucose, UA: NEGATIVE mg/dL
Hgb urine dipstick: NEGATIVE
Ketones, ur: NEGATIVE mg/dL
Leukocytes,Ua: NEGATIVE
Nitrite: NEGATIVE
Protein, ur: NEGATIVE mg/dL
Specific Gravity, Urine: 1.023 (ref 1.005–1.030)
pH: 6 (ref 5.0–8.0)

## 2019-04-05 LAB — CBC WITH DIFFERENTIAL/PLATELET
Abs Immature Granulocytes: 0.13 10*3/uL — ABNORMAL HIGH (ref 0.00–0.07)
Basophils Absolute: 0.2 10*3/uL — ABNORMAL HIGH (ref 0.0–0.1)
Basophils Relative: 1 %
Eosinophils Absolute: 0.3 10*3/uL (ref 0.0–0.5)
Eosinophils Relative: 2 %
HCT: 42.7 % (ref 36.0–46.0)
Hemoglobin: 14 g/dL (ref 12.0–15.0)
Immature Granulocytes: 1 %
Lymphocytes Relative: 55 %
Lymphs Abs: 8 10*3/uL — ABNORMAL HIGH (ref 0.7–4.0)
MCH: 29.6 pg (ref 26.0–34.0)
MCHC: 32.8 g/dL (ref 30.0–36.0)
MCV: 90.3 fL (ref 80.0–100.0)
Monocytes Absolute: 1.3 10*3/uL — ABNORMAL HIGH (ref 0.1–1.0)
Monocytes Relative: 9 %
Neutro Abs: 4.7 10*3/uL (ref 1.7–7.7)
Neutrophils Relative %: 32 %
Platelets: 266 10*3/uL (ref 150–400)
RBC: 4.73 MIL/uL (ref 3.87–5.11)
RDW: 13.5 % (ref 11.5–15.5)
Smear Review: NORMAL
WBC: 14.6 10*3/uL — ABNORMAL HIGH (ref 4.0–10.5)
nRBC: 0 % (ref 0.0–0.2)

## 2019-04-05 LAB — FERRITIN: Ferritin: 606 ng/mL — ABNORMAL HIGH (ref 11–307)

## 2019-04-05 LAB — TSH: TSH: 1.193 u[IU]/mL (ref 0.350–4.500)

## 2019-04-05 LAB — LACTATE DEHYDROGENASE: LDH: 327 U/L — ABNORMAL HIGH (ref 98–192)

## 2019-04-05 LAB — PATHOLOGIST SMEAR REVIEW

## 2019-04-05 MED ORDER — BLOOD GLUCOSE MONITOR KIT: PACK | 0 refills | Status: AC

## 2019-04-05 NOTE — Addendum Note (Signed)
Addended by: Santiago Bur on: 04/05/2019 08:36 AM   Modules accepted: Orders

## 2019-04-05 NOTE — Addendum Note (Signed)
Addended by: Santiago Bur on: 04/05/2019 08:35 AM   Modules accepted: Orders

## 2019-04-06 ENCOUNTER — Other Ambulatory Visit: Payer: Self-pay | Admitting: Infectious Diseases

## 2019-04-06 DIAGNOSIS — R509 Fever, unspecified: Secondary | ICD-10-CM

## 2019-04-06 LAB — ANA COMPREHENSIVE PANEL
Anti JO-1: <0.2 AI (ref 0.0–0.9)
Centromere Ab Screen: <0.2 AI (ref 0.0–0.9)
Chromatin Ab SerPl-aCnc: <0.2 AI (ref 0.0–0.9)
ENA SM Ab Ser-aCnc: <0.2 AI (ref 0.0–0.9)
Ribonucleic Protein: >8 AI — ABNORMAL HIGH (ref 0.0–0.9)
SSA (Ro) (ENA) Antibody, IgG: <0.2 AI (ref 0.0–0.9)
SSB (La) (ENA) Antibody, IgG: <0.2 AI (ref 0.0–0.9)
Scleroderma (Scl-70) (ENA) Antibody, IgG: <0.2 AI (ref 0.0–0.9)
ds DNA Ab: <1 IU/mL (ref 0–9)

## 2019-04-06 LAB — CMV IGM: CMV IgM: >240 AU/mL — ABNORMAL HIGH (ref 0.0–29.9)

## 2019-04-06 LAB — HIV ANTIBODY (ROUTINE TESTING W REFLEX): HIV Screen 4th Generation wRfx: NONREACTIVE

## 2019-04-06 LAB — EHRLICHIA ANTIBODY PANEL
E chaffeensis (HGE) Ab, IgG: NEGATIVE
E chaffeensis (HGE) Ab, IgM: NEGATIVE
E. Chaffeensis (HME) IgM Titer: NEGATIVE
E.Chaffeensis (HME) IgG: NEGATIVE

## 2019-04-06 LAB — CMV ANTIBODY, IGG (EIA): CMV Ab - IgG: 5.3 U/mL — ABNORMAL HIGH (ref 0.00–0.59)

## 2019-04-07 ENCOUNTER — Other Ambulatory Visit: Payer: Self-pay | Admitting: Internal Medicine

## 2019-04-07 LAB — QUANTIFERON-TB GOLD PLUS (RQFGPL)
QuantiFERON Mitogen Value: 7.46 IU/mL
QuantiFERON Nil Value: 0.1 IU/mL
QuantiFERON TB1 Ag Value: 0.13 IU/mL
QuantiFERON TB2 Ag Value: 0.12 IU/mL

## 2019-04-07 LAB — QUANTIFERON-TB GOLD PLUS: QuantiFERON-TB Gold Plus: NEGATIVE

## 2019-04-08 ENCOUNTER — Ambulatory Visit: Payer: Medicare HMO | Attending: Infectious Diseases | Admitting: Infectious Diseases

## 2019-04-08 ENCOUNTER — Other Ambulatory Visit
Admission: RE | Admit: 2019-04-08 | Discharge: 2019-04-08 | Disposition: A | Discharge status: Home / Self Care | Payer: Medicare HMO | Source: Ambulatory Visit | Attending: Infectious Diseases | Admitting: Infectious Diseases

## 2019-04-08 ENCOUNTER — Other Ambulatory Visit: Payer: Self-pay

## 2019-04-08 ENCOUNTER — Encounter: Payer: Self-pay | Admitting: Infectious Diseases

## 2019-04-08 VITALS — BP 103/71 | HR 105 | Temp 98.0°F | Wt 198.0 lb

## 2019-04-08 DIAGNOSIS — R509 Fever, unspecified: Secondary | ICD-10-CM | POA: Diagnosis not present

## 2019-04-08 DIAGNOSIS — R42 Dizziness and giddiness: Secondary | ICD-10-CM

## 2019-04-08 DIAGNOSIS — Z87442 Personal history of urinary calculi: Secondary | ICD-10-CM | POA: Diagnosis not present

## 2019-04-08 DIAGNOSIS — Z79899 Other long term (current) drug therapy: Secondary | ICD-10-CM

## 2019-04-08 DIAGNOSIS — R768 Other specified abnormal immunological findings in serum: Secondary | ICD-10-CM

## 2019-04-08 DIAGNOSIS — Z87891 Personal history of nicotine dependence: Secondary | ICD-10-CM | POA: Diagnosis not present

## 2019-04-08 DIAGNOSIS — K0889 Other specified disorders of teeth and supporting structures: Secondary | ICD-10-CM | POA: Diagnosis not present

## 2019-04-08 DIAGNOSIS — R5383 Other fatigue: Secondary | ICD-10-CM

## 2019-04-08 LAB — SAR COV2 SEROLOGY (COVID19)AB(IGG),IA: SARS-CoV-2 Ab, IgG: NONREACTIVE

## 2019-04-08 NOTE — Patient Instructions (Addendum)
You are here for follow up of Fever- the last 4 days your fever curve is trending low. You are still dizzyy and your BP is low Your labs revealed highly positive CMV IgM, slightly increased liver enzymes and increased lymphocytes indicative of a viral infection- called CMV I am doing another confirmatory test today to check any virus particles in the blood-  Drink plenty of fluids, do not take tylenol or advil unless temp > 100.6 Continue the temp log Will check your labs in 2 weeks  Talk to your PCP about low BP and lisinopril  Cytomegalovirus Infection, Adult Cytomegalovirus (CMV) is a common virus. Usually, a CMV infection does not cause problems in adults with a normal body defense system (immune system). However, in some people with a weak immune system, the infection can lead to serious conditions, such as blindness or swelling in the brain. If the virus spreads to a baby during pregnancy, it can cause pregnancy loss or growth problems or developmental disabilities in the baby. A CMV infection is more likely to be serious in:  People who have a weak immune system, such as from: ? HIV (human immunodeficiency virus). ? Cancer treatment. ? Treatment related to an organ or stem cell transplant.  Babies of pregnant women infected with the virus. A CMV infection cannot be prevented with a vaccine. What are the causes? This condition is caused by CMV, which may also be called human herpesvirus 5. This virus can spread through body fluids such as blood, urine, breast milk, saliva, and semen. You can get CMV by coming into contact with the body fluids of someone who is infected. This can happen if:  You get body fluids from an infected person on your hands and then rub your eyes or touch the inside of your nose or mouth.  You have unprotected sex with an infected person.  You kiss an infected person on the mouth.  You receive infected blood.  You have an organ transplant or bone marrow  transplant from an infected donor. What are the signs or symptoms? Symptoms of this condition include:  Tiredness (fatigue).  Weakness.  Fever that lasts for several days.  Achy muscles.  Sore throat.  Sore or enlarged lymph nodes.  Weight loss.  Headache. Symptoms may be more severe in people who have a weak immune system. In those people, the virus may affect one part of the body. For example, it may affect:  The stomach or intestines. This can lead to an infection called gastroenteritis.  The liver. This can lead to a liver disease called hepatitis.  The lungs. This can lead to inflammation of the lungs (pneumonia).  The eyes. This can make it hard to see and can lead to blindness.  The brain (rare). This can lead to seizures or a coma. Most people who have this condition never have symptoms. How is this diagnosed? This condition is diagnosed based on:  Symptoms.  A physical exam.  Blood and urine tests. How is this treated? This condition may be managed with:  Medicines to relieve symptoms, prevent complications, and keep the virus from spreading.  Antiviral medicines to treat symptoms if you are at higher risk of having a severe infection. No treatment will completely remove the virus from your body. If you get the virus, you will have it for the rest of your life. Follow these instructions at home:   Take over-the-counter and prescription medicines only as told by your health care provider.  Drink enough fluid to keep your urine pale yellow.  Take actions to keep the virus from spreading to others. For example: ? Wash your hands often. ? Throw away used tissues. ? Avoid sharing eating and drinking utensils.  Keep all follow-up visits as told by your health care provider. This is important. Contact a health care provider if:  You develop any symptoms.  You have a fever.  You are extremely tired.  You have muscle aches and a headache.  Your  muscles are unusually weak.  You become confused.  You are very irritable.  Your vision gets blurry or you have trouble seeing.  You get pregnant or you wish to get pregnant. Summary  Cytomegalovirus (CMV) is a common virus.  A CMV infection does not usually cause problems in adults with a normal body defense system (immune system). However, in some people with a weak immune system, the infection can lead to serious conditions, such as blindness or swelling in the brain.  Most people with CMV never have symptoms. If it does cause symptoms, they are similar to a flu-like illness.  No treatment will completely remove the virus from your body. If you get the virus, you will have it for the rest of your life. However, medicines can help relieve symptoms, prevent complications, and keep the virus from spreading. This information is not intended to replace advice given to you by your health care provider. Make sure you discuss any questions you have with your health care provider. Document Released: 06/12/2009 Document Revised: 06/26/2017 Document Reviewed: 06/26/2017 Elsevier Patient Education  2020 [employer].

## 2019-04-08 NOTE — Progress Notes (Signed)
NAME: [name]  DOB: Dec 23, 1950  MRN: [id number]  Date/Time: 04/08/2019 9:46 AM   Subjective:  followup visit for FUO-= saw her first last week and aksed her to keep a temperature log and get labs off antibiotic for 96 hrs- she had all her labs done this Monday   ?the following is from my last visit on 04/01/19 [name] is a 68 y.o.female with history of renal stones presents with fever for 3 weeks duration. 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  And in the morning she would get headache and dizziness. 2 days later she noted on her left fifth toe a small boil-like lesion which she used depends soaked in alcohol and opened it and release some pus and that healed after that.  The fever continued.  It was not accompanied by sore throat or cough or shortness of breath or runny nose or nasal congestion or diarrhea or abdominal pain or dysuria or incontinence or hematuria or joint pain or skin rash.  She has good appetite.  She has not lost any weight.  She had checked her temperature couple of times and it was 101 once but predominantly around 99 when she will take a Tylenol.   she did not have an obvious tick bite or insect bite.  But she does work in the yard a lot.  [place] She continued to go to work on a limited hour basis. She lives with her husband and takes care every day for 2 hours her grandchild who is 42 years old. She also has an 3-monthgreat grandson whom she takes care of once a week.  None of the family numbers have been sick. She has no travel history other than going to the beach. She has a 67year-old lab.  No animal bites. No water sports or exploring caves. She had a virtual visit with her PCP on 03/24/2019 and was given amoxicillin clavulanate.  Some blood work was  sent.  Urine culture was negative, WBC was normal but had in the differential 15% monocytes, 5% eosinophils . LFTs were normal.  Renal function normal.  ESR was 15. She has a history of mitral valve prolapse which was 2 she was told many years ago but is not been giving her any problems and I do not see any echo done recently. She has not been to the dentist in 5 years.  But she does not have any tooth pain currently.   Pt today is still having fatigue, fever curve trending down     She is dizzy early morning. She takes 20 mg of lisinopril at night Headaches better  Past Medical History:  Diagnosis Date  . Calcium oxalate renal stones   . Hyperlipidemia   . MVP (mitral valve prolapse)     Past Surgical History:  Procedure Laterality Date  . BREAST BIOPSY Right 08/19/12   neg  . extraction of renal stones    . TONSILLECTOMY AND ADENOIDECTOMY    . VAGINAL HYSTERECTOMY     class IV, ovaries intact    Social History   Socioeconomic History  . Marital status: Married    Spouse name: Not on file  . Number of children: 2  . Years of education: Not on file  . Highest education level: Not  on file  Occupational History  . Occupation: Naval architect  Social Needs  . Financial resource strain: Not on file  . Food insecurity    Worry: Not on file    Inability: Not on file  . Transportation needs    Medical: Not on file    Non-medical: Not on file  Tobacco Use  . Smoking status: Former Smoker    Packs/day: 0.25    Years: 30.00    Pack years: 7.50    Types: Cigarettes  . Smokeless tobacco: Never Used  Substance and Sexual Activity  . Alcohol use: Yes    Alcohol/week: 1.0 standard drinks    Types: 1 Glasses of wine per week    Comment: occasional  . Drug use: No  . Sexual activity: Not on file  Lifestyle  . Physical activity    Days per week: Not on file    Minutes per session: Not on file  . Stress: Not on file  Relationships  . Social Herbalist on  phone: Not on file    Gets together: Not on file    Attends religious service: Not on file    Active member of club or organization: Not on file    Attends meetings of clubs or organizations: Not on file    Relationship status: Not on file  . Intimate partner violence    Fear of current or ex partner: Not on file    Emotionally abused: Not on file    Physically abused: Not on file    Forced sexual activity: Not on file  Other Topics Concern  . Not on file  Social History Narrative  . Not on file    Family History  Problem Relation Age of Onset  . Hypertension Mother   . Stroke Father 72  . Heart disease Maternal Grandmother   . Heart disease Maternal Grandfather   . Hypertension Paternal Grandmother   . Cancer Maternal Aunt        breast and lung   No Known Allergies I ? Current Outpatient Medications  Medication Sig Dispense Refill  . atorvastatin (LIPITOR) 10 MG tablet TAKE 1 TABLET (10 MG TOTAL) BY MOUTH DAILY. MUST SCHEDULE ANNUAL PHYSICAL 90 tablet 0  . blood glucose meter kit and supplies KIT Lifescan meter Check blood sugar 2 times daily as needed 1 each 0  . calcium carbonate (CALCIUM 600) 600 MG TABS tablet Take 1 tablet (600 mg total) by mouth 2 (two) times daily with a meal. 30 tablet 0  . Cholecalciferol (VITAMIN D) 50 MCG (2000 UT) CAPS Take by mouth. 30 capsule   . lisinopril (ZESTRIL) 20 MG tablet Take 1 tablet (20 mg total) by mouth daily. 90 tablet 0  . Multiple Vitamin (MULTIVITAMIN) tablet Take 1 tablet by mouth daily.      . traZODone (DESYREL) 50 MG tablet Take 0.5-1 tablets (25-50 mg total) by mouth at bedtime. 90 tablet 1  . amoxicillin-clavulanate (AUGMENTIN) 875-125 MG tablet Take 1 tablet by mouth 2 (two) times daily. (Patient not taking: Reported on 04/08/2019) 20 tablet 0   No current facility-administered medications for this visit.      Abtx:  Anti-infectives (From admission, onward)   None      REVIEW OF SYSTEMS:  Const: fever negative  weight loss Eyes: negative diplopia or visual changes, negative eye pain ENT: negative coryza, negative sore throat Resp: negative cough, hemoptysis, dyspnea Cards: negative for chest pain, palpitations, lower extremity edema GU:  negative for frequency, dysuria and hematuria GI: Negative for abdominal pain, diarrhea, bleeding, constipation Skin: negative for rash and pruritus Heme: negative for easy bruising and gum/nose bleeding MS: negative for myalgias, arthralgias, back pain and muscle weakness Neurolo: headaches, dizziness, No vertigo, memory problems  Psych: negative for feelings of anxiety, depression  Endocrine: negative for thyroid, diabetes Allergy/Immunology- negative for any medication or food allergies ? Objective:  VITALS:  BP 103/71 (BP Location: Left Arm, Patient Position: Sitting, Cuff Size: Normal)   Pulse (!) 105   Temp 98 F (36.7 C) (Oral)   Wt 198 lb (89.8 kg)   BMI 31.96 kg/m     115/74 supine- 101 126/75 sitting-112 113/75 standing-109   PHYSICAL EXAM:  General: Alert, cooperative, no distress, appears stated age.  Head: Normocephalic, without obvious abnormality, atraumatic. Eyes: Conjunctivae clear, anicteric sclerae. Pupils are equal ENT Nares normal. No drainage or sinus tenderness. Lips, mucosa, and tongue normal. No Thrush Dentition poor Neck: Supple, symmetrical, no adenopathy, thyroid: non tender no carotid bruit and no JVD. Back: No CVA tenderness. Lungs: Clear to auscultation bilaterally. No Wheezing or Rhonchi. No rales. Heart: Regular rate and rhythm, no murmur, rub or gallop. Abdomen: Soft, non-tender,not distended. Bowel sounds normal. No masses Extremities: atraumatic, no cyanosis. No edema. No clubbing Skin: No rashes or lesions. Or bruising Lymph: Cervical, supraclavicular normal. Neurologic: Grossly non-focal Pertinent Labs Lab Results CBC    Component Value Date/Time   WBC 14.6 (H) 04/05/2019 0924   RBC 4.73 04/05/2019  0924   HGB 14.0 04/05/2019 0924   HCT 42.7 04/05/2019 0924   PLT 266 04/05/2019 0924   MCV 90.3 04/05/2019 0924   MCH 29.6 04/05/2019 0924   MCHC 32.8 04/05/2019 0924   RDW 13.5 04/05/2019 0924   LYMPHSABS 8.0 (H) 04/05/2019 0924   MONOABS 1.3 (H) 04/05/2019 0924   EOSABS 0.3 04/05/2019 0924   BASOSABS 0.2 (H) 04/05/2019 0924    CMP Latest Ref Rng & Units 04/05/2019 03/24/2019 10/15/2018  Glucose 70 - 99 mg/dL 117(H) 156(H) 101(H)  BUN 8 - 23 mg/dL 15 17 14   Creatinine 0.44 - 1.00 mg/dL 1.03(H) 0.88 0.90  Sodium 135 - 145 mmol/L 134(L) 138 140  Potassium 3.5 - 5.1 mmol/L 4.6 4.1 4.5  Chloride 98 - 111 mmol/L 100 104 105  CO2 22 - 32 mmol/L 27 28 28   Calcium 8.9 - 10.3 mg/dL 9.1 8.8 9.2  Total Protein 6.5 - 8.1 g/dL 7.2 6.6 6.9  Total Bilirubin 0.3 - 1.2 mg/dL 0.8 0.5 0.5  Alkaline Phos 38 - 126 U/L 50 56 56  AST 15 - 41 U/L 43(H) 24 17  ALT 0 - 44 U/L 56(H) 33 24      Microbiology: Recent Results (from the past 240 hour(s))  Blood culture (routine single)     Status: None (Preliminary result)   Collection Time: 04/05/19  9:24 AM   Specimen: BLOOD  Result Value Ref Range Status   Specimen Description BLOOD LEFT ANTECUBITAL  Final   Special Requests   Final    BOTTLES DRAWN AEROBIC AND ANAEROBIC Blood Culture adequate volume   Culture   Final    NO GROWTH 3 DAYS Performed at [facility], [address] [place], [place] [address]    Report Status PENDING  Incomplete  Blood culture (routine single)     Status: None (Preliminary result)   Collection Time: 04/05/19  9:24 AM   Specimen: BLOOD  Result Value Ref Range Status   Specimen  Description BLOOD RIGHT ANTECUBITAL  Final   Special Requests   Final    BOTTLES DRAWN AEROBIC AND ANAEROBIC Blood Culture adequate volume   Culture   Final    NO GROWTH 3 DAYS Performed at [facility], [address] [place], [place] [address]    Report Status PENDING  Incomplete    IMAGING RESULTS:  CXr N ?  Impression/Recommendation ?Fever of unknown origin.  Work up revealed CMV IgM > 200 and IgG only 3.atypical lymphocytosis in the peripheral smear, minimally elevated AST/ALT- they all favor acute CMV mononucleosis syndrome Fever curve is down, headache better She is dizzy every morning She takes lisinopril 20 mg at night and BP was low- No orthostasis but BP on the lower side   Other work up including Quantiferon gold, HIV, CXR, Blood culture, ehrlichia panel were all negative. ANA complex showed RNP > 8- dont know the significance- could be false positive - can check later   Asked lab to check CMV DNA   Asked patient to monitor her temp, no take any tylenol, Speal to ehr PCP about reducing dose of lisinopril as the dizziness is due to that. Asked her to hold lisinopril until she gets in touch with her Will repeat CBC/CMP 2-3 weeks   Note:  This document was prepared using Dragon voice recognition software and may include unintentional dictation errors.

## 2019-04-09 DIAGNOSIS — S99922A Unspecified injury of left foot, initial encounter: Secondary | ICD-10-CM | POA: Diagnosis not present

## 2019-04-09 DIAGNOSIS — S8992XA Unspecified injury of left lower leg, initial encounter: Secondary | ICD-10-CM | POA: Diagnosis not present

## 2019-04-09 DIAGNOSIS — I10 Essential (primary) hypertension: Secondary | ICD-10-CM | POA: Diagnosis not present

## 2019-04-09 DIAGNOSIS — M62838 Other muscle spasm: Secondary | ICD-10-CM | POA: Diagnosis not present

## 2019-04-09 DIAGNOSIS — S99912A Unspecified injury of left ankle, initial encounter: Secondary | ICD-10-CM | POA: Diagnosis not present

## 2019-04-09 DIAGNOSIS — M6281 Muscle weakness (generalized): Secondary | ICD-10-CM | POA: Diagnosis not present

## 2019-04-09 DIAGNOSIS — W1789XA Other fall from one level to another, initial encounter: Secondary | ICD-10-CM | POA: Diagnosis not present

## 2019-04-09 DIAGNOSIS — M79669 Pain in unspecified lower leg: Secondary | ICD-10-CM | POA: Diagnosis not present

## 2019-04-09 DIAGNOSIS — E119 Type 2 diabetes mellitus without complications: Secondary | ICD-10-CM | POA: Diagnosis not present

## 2019-04-09 LAB — EPSTEIN-BARR VIRUS (EBV) ANTIBODY PROFILE
EBV NA IgG: 526 U/mL — ABNORMAL HIGH (ref 0.0–17.9)
EBV VCA IgG: 580 U/mL — ABNORMAL HIGH (ref 0.0–17.9)
EBV VCA IgM: >160 U/mL — ABNORMAL HIGH (ref 0.0–35.9)

## 2019-04-10 LAB — CULTURE, BLOOD (SINGLE)
Culture: NO GROWTH
Culture: NO GROWTH
Special Requests: ADEQUATE
Special Requests: ADEQUATE

## 2019-04-12 LAB — HEPATITIS PANEL, ACUTE
HCV Ab: <0.1 s/co ratio (ref 0.0–0.9)
Hep A IgM: NEGATIVE
Hep B C IgM: NEGATIVE
Hepatitis B Surface Ag: NEGATIVE

## 2019-04-13 LAB — CMV DNA, QUANTITATIVE, PCR
CMV DNA Quant: 1080 IU/mL
Log10 CMV Qn DNA Pl: 3.033 log10 IU/mL

## 2019-04-14 ENCOUNTER — Encounter: Payer: Self-pay | Admitting: Internal Medicine

## 2019-04-14 LAB — MISC LABCORP TEST (SEND OUT): Labcorp test code: 520075

## 2019-04-15 ENCOUNTER — Encounter: Payer: Self-pay | Admitting: Infectious Diseases

## 2019-04-15 NOTE — Progress Notes (Signed)
Pt left a message thru my chart saying she had a leg cramp when she was out on staurday which made her fall and she had to go to the ER in [place]. Electrolytes were normal she said-  I had seen her on 9/24 and 10/1  for FUO and was diagnosed with Acute CMV infection-CMV DNA was around 1000 indicating that it is improving and her acute infection is resolving. her fever has resolved - and she has not had any in the past week. She was having dizziness and during her last visit to me her BP was low and she had orthostasis- I asked her to hold lisinopril and contact her PCP ASAP to adjust the dose. Which patient did not do. When she went to the ER on Saturday she was asked to take 1/2 lisinopril and since then she does not have dizziness The cramping of her legs is not due to CMV and I think is related to low BP. She will follow up with her PCP- she will need CBC/CMP in 2 weeks done by her PCP to check whether the LFTS have normalized- She also had a high ANA and positive RNP ( part of FUO workup) . Not sure whether this is false positive due to CMV or whether ti is  True positive test = She will have to follow up with her PCP regarding this as well. Explained this to her and  answered her questions

## 2019-04-16 ENCOUNTER — Ambulatory Visit (INDEPENDENT_AMBULATORY_CARE_PROVIDER_SITE_OTHER): Payer: Medicare HMO | Admitting: Internal Medicine

## 2019-04-16 ENCOUNTER — Other Ambulatory Visit: Payer: Self-pay

## 2019-04-16 ENCOUNTER — Encounter: Payer: Self-pay | Admitting: Internal Medicine

## 2019-04-16 VITALS — BP 112/76 | HR 75 | Temp 98.3°F | Wt 193.0 lb

## 2019-04-16 DIAGNOSIS — R768 Other specified abnormal immunological findings in serum: Secondary | ICD-10-CM | POA: Diagnosis not present

## 2019-04-16 DIAGNOSIS — B251 Cytomegaloviral hepatitis: Secondary | ICD-10-CM

## 2019-04-16 DIAGNOSIS — I951 Orthostatic hypotension: Secondary | ICD-10-CM | POA: Diagnosis not present

## 2019-04-16 DIAGNOSIS — R252 Cramp and spasm: Secondary | ICD-10-CM

## 2019-04-16 DIAGNOSIS — I952 Hypotension due to drugs: Secondary | ICD-10-CM

## 2019-04-16 DIAGNOSIS — R69 Illness, unspecified: Secondary | ICD-10-CM | POA: Diagnosis not present

## 2019-04-16 MED ORDER — LISINOPRIL 10 MG PO TABS: 10.0000 mg | ORAL_TABLET | Freq: Every day | ORAL | 2 refills | Status: DC

## 2019-04-16 NOTE — Progress Notes (Signed)
Subjective:    Patient ID: Jacqueline [name] [facility], female    DOB: [birth year], 68 y.o.   MRN: [id number]  HPI  Pt presents to the clinic today for follow up of HTN and cramping in her legs. She reports she had been feeling lightheaded the last few months. She was advised by ID to hold her Lisinopril which she did not. She ended up being out of town and became very lightheaded. She went to the ER in [facility]. She was diagnosed with orthostasis and hypotension. She was advised to cut her Lisinopril in half. She has been taking the half dose of Lisinopril and feeling much better. Her dizziness has resolved. It sounded like the leg cramping was actually a charlie horse and has resolved without reoccurrence. Her BP's at home are running around 115/70.  She also saw ID for follow up for FUO. She was diagnosed with CMV. Labs show improving infection. Her ANA and RNP were elevated. She wanted to have repeat labwork for further evaluation.  Review of Systems      Past Medical History:  Diagnosis Date  . Calcium oxalate renal stones   . Hyperlipidemia   . MVP (mitral valve prolapse)     Current Outpatient Medications  Medication Sig Dispense Refill  . amoxicillin-clavulanate (AUGMENTIN) 875-125 MG tablet Take 1 tablet by mouth 2 (two) times daily. (Patient not taking: Reported on 04/08/2019) 20 tablet 0  . atorvastatin (LIPITOR) 10 MG tablet TAKE 1 TABLET (10 MG TOTAL) BY MOUTH DAILY. MUST SCHEDULE ANNUAL PHYSICAL 90 tablet 0  . blood glucose meter kit and supplies KIT Lifescan meter Check blood sugar 2 times daily as needed 1 each 0  . calcium carbonate (CALCIUM 600) 600 MG TABS tablet Take 1 tablet (600 mg total) by mouth 2 (two) times daily with a meal. 30 tablet 0  . Cholecalciferol (VITAMIN D) 50 MCG (2000 UT) CAPS Take by mouth. 30 capsule   . lisinopril (ZESTRIL) 20 MG tablet Take 1 tablet (20 mg total) by mouth daily. 90 tablet 0  . Multiple Vitamin (MULTIVITAMIN) tablet Take 1 tablet by  mouth daily.      . traZODone (DESYREL) 50 MG tablet TAKE 0.5-1 TABLETS (25-50 MG TOTAL) BY MOUTH AT BEDTIME. 90 tablet 0   No current facility-administered medications for this visit.     No Known Allergies  Family History  Problem Relation Age of Onset  . Hypertension Mother   . Stroke Father 82  . Heart disease Maternal Grandmother   . Heart disease Maternal Grandfather   . Hypertension Paternal Grandmother   . Cancer Maternal Aunt        breast and lung    Social History   Socioeconomic History  . Marital status: Married    Spouse name: Not on file  . Number of children: 2  . Years of education: Not on file  . Highest education level: Not on file  Occupational History  . Occupation: Naval architect  Social Needs  . Financial resource strain: Not on file  . Food insecurity    Worry: Not on file    Inability: Not on file  . Transportation needs    Medical: Not on file    Non-medical: Not on file  Tobacco Use  . Smoking status: Former Smoker    Packs/day: 0.25    Years: 30.00    Pack years: 7.50    Types: Cigarettes  . Smokeless tobacco: Never Used  Substance and Sexual Activity  .  Alcohol use: Yes    Alcohol/week: 1.0 standard drinks    Types: 1 Glasses of wine per week    Comment: occasional  . Drug use: No  . Sexual activity: Not on file  Lifestyle  . Physical activity    Days per week: Not on file    Minutes per session: Not on file  . Stress: Not on file  Relationships  . Social Herbalist on phone: Not on file    Gets together: Not on file    Attends religious service: Not on file    Active member of club or organization: Not on file    Attends meetings of clubs or organizations: Not on file    Relationship status: Not on file  . Intimate partner violence    Fear of current or ex partner: Not on file    Emotionally abused: Not on file    Physically abused: Not on file    Forced sexual activity: Not on file  Other Topics  Concern  . Not on file  Social History Narrative  . Not on file     Constitutional: Denies fever, malaise, fatigue, headache or abrupt weight changes.  Respiratory: Denies difficulty breathing, shortness of breath, cough or sputum production.   Cardiovascular: Denies chest pain, chest tightness, palpitations or swelling in the hands or feet.  Musculoskeletal: Pt reports muscle cramp of leg, now resolved. Denies decrease in range of motion, difficulty with gait, muscle or joint pain and swelling.  Skin: Denies redness, rashes, lesions or ulcercations.  Neurological: Denies dizziness, difficulty with memory, difficulty with speech or problems with balance and coordination.    No other specific complaints in a complete review of systems (except as listed in HPI above).  Objective:   Physical Exam  BP 112/76   Pulse 75   Temp 98.3 F (36.8 C) (Oral)   Wt 193 lb (87.5 kg)   SpO2 98%   BMI 31.15 kg/m  Wt Readings from Last 3 Encounters:  04/16/19 193 lb (87.5 kg)  04/08/19 198 lb (89.8 kg)  04/01/19 202 lb (91.6 kg)    General: Appears her stated age, obese, in NAD. Cardiovascular: Normal rate and rhythm. S1,S2 noted.  No murmur, rubs or gallops noted. No JVD or BLE edema.  Pulmonary/Chest: Normal effort and positive vesicular breath sounds. No respiratory distress. No wheezes, rales or ronchi noted.  Musculoskeletal: Strength 5/5 BUE/BLE. No difficulty with gait.  Neurological: Alert and oriented. Cranial nerves II-XII grossly intact. Coordination normal.  Psychiatric: Anxious appearing.  BMET    Component Value Date/Time   NA 134 (L) 04/05/2019 0924   K 4.6 04/05/2019 0924   CL 100 04/05/2019 0924   CO2 27 04/05/2019 0924   GLUCOSE 117 (H) 04/05/2019 0924   BUN 15 04/05/2019 0924   CREATININE 1.03 (H) 04/05/2019 0924   CALCIUM 9.1 04/05/2019 0924   GFRNONAA 56 (L) 04/05/2019 0924   GFRAA >60 04/05/2019 0924    Lipid Panel     Component Value Date/Time   CHOL 136  10/15/2018 0904   TRIG 59.0 10/15/2018 0904   HDL 46.50 10/15/2018 0904   CHOLHDL 3 10/15/2018 0904   VLDL 11.8 10/15/2018 0904   LDLCALC 78 10/15/2018 0904    CBC    Component Value Date/Time   WBC 14.6 (H) 04/05/2019 0924   RBC 4.73 04/05/2019 0924   HGB 14.0 04/05/2019 0924   HCT 42.7 04/05/2019 0924   PLT 266 04/05/2019 0924  MCV 90.3 04/05/2019 0924   MCH 29.6 04/05/2019 0924   MCHC 32.8 04/05/2019 0924   RDW 13.5 04/05/2019 0924   LYMPHSABS 8.0 (H) 04/05/2019 0924   MONOABS 1.3 (H) 04/05/2019 0924   EOSABS 0.3 04/05/2019 0924   BASOSABS 0.2 (H) 04/05/2019 0924    Hgb A1C Lab Results  Component Value Date   HGBA1C 6.6 (H) 03/26/2019            Assessment & Plan:   ER Follow Up for Hypotension, Orthostasis, Muscle Cramp of Leg:  ER notes, labs and imaging reviewed Will plan to continue Lisinopril 10 mg daily Encouraged adequate water intake, make position changes slowly Encouraged regular stretching of the lower extremities No further workup needed at this time  Elevated  ANA, RNP, CMV Infection:  Will repeat CMET, ANA, RNP in 1 month, lab only  Will continue to monitor blood pressure, update me if symptoms reoccur Webb Silversmith, NP

## 2019-04-19 ENCOUNTER — Encounter: Payer: Self-pay | Admitting: Internal Medicine

## 2019-04-19 NOTE — Patient Instructions (Signed)
Hypotension °As your heart beats, it forces blood through your body. This force is called blood pressure. If you have hypotension, you have low blood pressure. When your blood pressure is too low, you may not get enough blood to your brain or other parts of your body. This may cause you to feel weak, light-headed, have a fast heartbeat, or even pass out (faint). Low blood pressure may be harmless, or it may cause serious problems. °What are the causes? °· Blood loss. °· Not enough water in the body (dehydration). °· Heart problems. °· Hormone problems. °· Pregnancy. °· A very bad infection. °· Not having enough of certain nutrients. °· Very bad allergic reactions. °· Certain medicines. °What increases the risk? °· Age. The risk increases as you get older. °· Conditions that affect the heart or the brain and spinal cord (central nervous system). °· Taking certain medicines. °· Being pregnant. °What are the signs or symptoms? °· Feeling: °? Weak. °? Light-headed. °? Dizzy. °? Tired (fatigued). °· Blurred vision. °· Fast heartbeat. °· Passing out, in very bad cases. °How is this treated? °· Changing your diet. This may involve eating more salt (sodium) or drinking more water. °· Taking medicines to raise your blood pressure. °· Changing how much you take (the dosage) of some of your medicines. °· Wearing compression stockings. These stockings help to prevent blood clots and reduce swelling in your legs. °In some cases, you may need to go to the hospital for: °· Fluid replacement. This means you will receive fluids through an IV tube. °· Blood replacement. This means you will receive donated blood through an IV tube (transfusion). °· Treating an infection or heart problems, if this applies. °· Monitoring. You may need to be monitored while medicines that you are taking wear off. °Follow these instructions at home: °Eating and drinking ° °· Drink enough fluids to keep your pee (urine) pale yellow. °· Eat a healthy diet.  Follow instructions from your doctor about what you can eat or drink. A healthy diet includes: °? Fresh fruits and vegetables. °? Whole grains. °? Low-fat (lean) meats. °? Low-fat dairy products. °· Eat extra salt only as told. Do not add extra salt to your diet unless your doctor tells you to. °· Eat small meals often. °· Avoid standing up quickly after you eat. °Medicines °· Take over-the-counter and prescription medicines only as told by your doctor. °? Follow instructions from your doctor about changing how much you take of your medicines, if this applies. °? Do not stop or change any of your medicines on your own. °General instructions ° °· Wear compression stockings as told by your doctor. °· Get up slowly from lying down or sitting. °· Avoid hot showers and a lot of heat as told by your doctor. °· Return to your normal activities as told by your doctor. Ask what activities are safe for you. °· Do not use any products that contain nicotine or tobacco, such as cigarettes, e-cigarettes, and chewing tobacco. If you need help quitting, ask your doctor. °· Keep all follow-up visits as told by your doctor. This is important. °Contact a doctor if: °· You throw up (vomit). °· You have watery poop (diarrhea). °· You have a fever for more than 2-3 days. °· You feel more thirsty than normal. °· You feel weak and tired. °Get help right away if: °· You have chest pain. °· You have a fast or uneven heartbeat. °· You lose feeling (have numbness) in any   part of your body. °· You cannot move your arms or your legs. °· You have trouble talking. °· You get sweaty or feel light-headed. °· You pass out. °· You have trouble breathing. °· You have trouble staying awake. °· You feel mixed up (confused). °Summary °· Hypotension is also called low blood pressure. It is when the force of blood pumping through your arteries is too weak. °· Hypotension may be harmless, or it may cause serious problems. °· Treatment may include changing  your diet and medicines, and wearing compression stockings. °· In very bad cases, you may need to go to the hospital. °This information is not intended to replace advice given to you by your health care provider. Make sure you discuss any questions you have with your health care provider. °Document Released: 09/18/2009 Document Revised: 12/18/2017 Document Reviewed: 12/18/2017 °Elsevier Patient Education © 2020 [employer] ° °

## 2019-04-30 ENCOUNTER — Other Ambulatory Visit (INDEPENDENT_AMBULATORY_CARE_PROVIDER_SITE_OTHER): Payer: Medicare HMO

## 2019-04-30 DIAGNOSIS — B251 Cytomegaloviral hepatitis: Secondary | ICD-10-CM

## 2019-04-30 DIAGNOSIS — R768 Other specified abnormal immunological findings in serum: Secondary | ICD-10-CM | POA: Diagnosis not present

## 2019-04-30 LAB — COMPREHENSIVE METABOLIC PANEL
ALT: 32 U/L (ref 0–35)
AST: 24 U/L (ref 0–37)
Albumin: 4 g/dL (ref 3.5–5.2)
Alkaline Phosphatase: 53 U/L (ref 39–117)
BUN: 13 mg/dL (ref 6–23)
CO2: 31 mEq/L (ref 19–32)
Calcium: 9.7 mg/dL (ref 8.4–10.5)
Chloride: 101 mEq/L (ref 96–112)
Creatinine, Ser: 1.18 mg/dL (ref 0.40–1.20)
GFR: 45.58 mL/min — ABNORMAL LOW (ref >60.00)
Glucose, Bld: 147 mg/dL — ABNORMAL HIGH (ref 70–99)
Potassium: 4.1 mEq/L (ref 3.5–5.1)
Sodium: 138 mEq/L (ref 135–145)
Total Bilirubin: 0.7 mg/dL (ref 0.2–1.2)
Total Protein: 7.2 g/dL (ref 6.0–8.3)

## 2019-05-03 LAB — ANA: Anti Nuclear Antibody (ANA): NEGATIVE

## 2019-05-03 LAB — RNP ANTIBODY: Ribonucleic Protein(ENA) Antibody, IgG: >8 AI — AB

## 2019-05-05 ENCOUNTER — Encounter: Payer: Self-pay | Admitting: Internal Medicine

## 2019-05-05 NOTE — Telephone Encounter (Signed)
Lab result note forwarded to ID, Dr who requested labs

## 2019-05-28 ENCOUNTER — Other Ambulatory Visit: Payer: Self-pay

## 2019-05-28 ENCOUNTER — Encounter: Payer: Self-pay | Admitting: Family Medicine

## 2019-05-28 ENCOUNTER — Ambulatory Visit (INDEPENDENT_AMBULATORY_CARE_PROVIDER_SITE_OTHER): Payer: Medicare HMO | Admitting: Family Medicine

## 2019-05-28 VITALS — BP 132/68 | HR 94 | Temp 97.3°F | Resp 16 | Ht 64.0 in | Wt 189.1 lb

## 2019-05-28 DIAGNOSIS — R7402 Elevation of levels of lactic acid dehydrogenase (LDH): Secondary | ICD-10-CM | POA: Diagnosis not present

## 2019-05-28 DIAGNOSIS — R768 Other specified abnormal immunological findings in serum: Secondary | ICD-10-CM | POA: Diagnosis not present

## 2019-05-28 DIAGNOSIS — M62838 Other muscle spasm: Secondary | ICD-10-CM | POA: Diagnosis not present

## 2019-05-28 DIAGNOSIS — M791 Myalgia, unspecified site: Secondary | ICD-10-CM | POA: Diagnosis not present

## 2019-05-28 DIAGNOSIS — E785 Hyperlipidemia, unspecified: Secondary | ICD-10-CM

## 2019-05-28 DIAGNOSIS — I10 Essential (primary) hypertension: Secondary | ICD-10-CM | POA: Diagnosis not present

## 2019-05-28 DIAGNOSIS — N179 Acute kidney failure, unspecified: Secondary | ICD-10-CM

## 2019-05-28 DIAGNOSIS — E119 Type 2 diabetes mellitus without complications: Secondary | ICD-10-CM

## 2019-05-28 DIAGNOSIS — B259 Cytomegaloviral disease, unspecified: Secondary | ICD-10-CM

## 2019-05-28 DIAGNOSIS — Z5181 Encounter for therapeutic drug level monitoring: Secondary | ICD-10-CM

## 2019-05-28 DIAGNOSIS — Z7689 Persons encountering health services in other specified circumstances: Secondary | ICD-10-CM | POA: Diagnosis not present

## 2019-05-28 NOTE — Progress Notes (Signed)
Name: Jacqueline [name] [facility]   MRN: [id number]    DOB: October 07, 1950   Date:05/28/2019       Progress Note  Chief Complaint  Patient presents with  . Establish Care  . Diabetes  . Extremity Weakness    Bilateral legs and they will spasm up  . CMV    Has quesions about her new diagnosis-ran a fever 102 F over a month in September     Subjective:   Jacqueline [name] is a 68 y.o. female, presents to clinic to establish care, was previously established with PCP at [facility].  She complains of a lot of new acute issues, leg cramps, a fall, hospitalizations, infections and shes seen specialist and has a lot of abnormal labs that have not been followed up on - per her report.    Leg cramps, onset about 5-6 weeks ago, sudden onset and severe - were like a severe charlie horse, both legs in calves and up the back of her legs feel tight, spasm, they feel weak as well.  Muscle spasms cramps and pain are not waking from sleep, sx have been less severe but continued to occur intermittently, several times a day, no certain activity seems to be bringing it on.  Feels like spasms, cramps, aches and pain also described as weakness.   For example she had some earlier today while at her desk working.  [place], has been seen by her previous PCP since for follow-up, and on her own she has been supplementing magnesium, potassium, drinking gallon of water a day.  She does feel weak all over and slightly achy all over when asked if she has some similar symptoms in her arm she does endorse similar symptoms all over her body.  She needs help getting up and down from the exam table today and she is never had anything like this before. [place] first weekend of October, she was at [employer] on vacation at the grocery store had severe charlie horse cause her to fall and then she went to the hospital   2 months ago she also had  fever of unknown origin which was initially worked up by her PCP and she was then referred to infectious disease.   ID initial OV A&P : ?Fever of unknown origin.  Patient does not have any other localizing signs or symptoms.  She only has headache and dizziness in the morning.  ESR is normal. Unclear etiology for fever Could be infectious versus noninfectious cause. If it is infection it could be viral.  She was tested negative for COVID.  As she is currently on oral antibiotics I have asked her to stop it and we will do blood culture and other tests including LDH, ferritin, CMV antibodies, SARS-CoV-2 antibodies, QuantiFERON gold, Ehrlichia antibodies, peripheral smear, chest x-ray early next week. She was found to have CMV, and Epstein-Barr virus antibody panel + for past infection.  Patient has a lot of questions about her labs from the specialist With initial work-up LDH was also elevated, ferritin was elevated, LFTs were mildly elevated, albumin was low creatinine was mildly out of normal range serum creatinine 1.03  Infectious disease, Dr. Delaine Lame did follow CMV viral load which continue to decrease.  Follow-up visit on October 1 OV A&P:   Impression/Recommendation ?Fever of unknown origin.  Work up revealed CMV IgM > 200 and IgG only 3.atypical  lymphocytosis in the peripheral smear, minimally elevated AST/ALT- they all favor acute CMV mononucleosis syndrome Fever curve is down, headache better She is dizzy every morning She takes lisinopril 20 mg at night and BP was low- No orthostasis but BP on the lower side   Other work up including Quantiferon gold, HIV, CXR, Blood culture, ehrlichia panel were all negative. ANA complex showed RNP > 8- dont know the significance- could be false positive - can check later   Asked lab to check CMV DNA   Asked patient to monitor her temp, no take any tylenol, Speal to ehr PCP about reducing dose of lisinopril as the dizziness is due to that.  Asked her to hold lisinopril until she gets in touch with her Will repeat CBC/CMP 2-3 weeks  Patient was given in a handout explaining all of her lab work on 04/08/2019 she also was given a handout about cytomegaly virus.  Dr. Delaine Lame also did listen to the patient's symptoms noticed her blood pressure was soft and encouraged her to decrease her blood pressure medication dose and follow-up with her PCP and this was written in the chart and given to the patient on her instructions.  About a week later patient called Dr. Alfonso Patten again about her blood pressure medication and was again asked to hold her lisinopril and contact her PCP as soon as possible to adjust the dose.  It was explained again to the patient that leg cramps were not due to CMV is more likely related to blood pressure medication or soft BP. She was told that her labs for her liver improved but she did have high ANA and positive RNP work-up, and that it was unclear if it was a false positive due to CMV or if it is a true positive test which should be followed up by PCP.  It was documented that all of these things were explained to her and her questions were asked and answered. The next day the patient did follow-up with her primary care doctor.  Her PCP at that time plan to continue the decreased dose of lisinopril at 10 mg daily, encouraged water intake changing positions slowly.  Elevated labs, ANA RNP, CMV her PCP plan for repeat CMP ANA RNP in 1 month -  They were repeated on April 30, 2019 ANA was negative, RNP antibody was positive, and chemistry was significant for improved albumin, normal liver function test, normal potassium, decreased GFR 45, and elevated blood sugar 147.    I reached out to [facility] NP her past PCP -she states that she wanted her to follow back up with infectious disease on their labs that they ordered which were just drawn at her clinic.   Diabetes Mellitus Type II: Diagnosed in September 2020 Currently managing  with lifestyle diet changes:  She has been cutting out simple carbs and sweets, shes been walking Denies: Polyuria, polydipsia, polyphagia, vision changes, or neuropathy She drinks a lot of water and does have polyuria and wakes up once a night to urinate, drinks her water up until 8 pm Recent pertinent labs: Lab Results  Component Value Date   HGBA1C 6.6 (H) 03/26/2019      Component Value Date/Time   NA 138 04/30/2019 0903   K 4.1 04/30/2019 0903   CL 101 04/30/2019 0903   CO2 31 04/30/2019 0903   GLUCOSE 147 (H) 04/30/2019 0903   BUN 13 04/30/2019 0903   CREATININE 1.18 04/30/2019 0903   CALCIUM 9.7 04/30/2019 0903  PROT 7.2 04/30/2019 0903   ALBUMIN 4.0 04/30/2019 0903   AST 24 04/30/2019 0903   ALT 32 04/30/2019 0903   ALKPHOS 53 04/30/2019 0903   BILITOT 0.7 04/30/2019 0903   GFRNONAA 56 (L) 04/05/2019 0924   GFRAA >60 04/05/2019 0924   Current diet: in general, a "healthy" diet   Current exercise: walking -her physical activity has been limited from her pain weakness ACEI/ARB: Yes Statin: Yes   Hyperlipidemia: Current Medication Regimen:   lipitor 10 mg on for years for cholesterol - no SE in the past, she does eat a healthy diet and is also taking aspirin.  Never had myalgias before.  Labs a few months ago show elevated LFTs but most recent labs show normalized LFTs Last Lipids: Lab Results  Component Value Date   CHOL 136 10/15/2018   HDL 46.50 10/15/2018   LDLCALC 78 10/15/2018   LDLDIRECT 149.5 12/25/2006   TRIG 59.0 10/15/2018   CHOLHDL 3 10/15/2018   - Current Diet:  Healthy - Denies: Chest pain, shortness of breath Discussed further her leg symptoms and cramps and they do not come on with exertion - so no claudication, she denies any thinning of her skin or hair in her lower extremities, pallor - Documented aortic atherosclerosis? No - Risk factors for atherosclerosis: diabetes mellitus, hypercholesterolemia and hypertension  Hypertension:  Currently  managed on lisinopril 10 mg daily Pt reports good med compliance and denies any SE. after decreasing dose from 20 mg back down to 10 mg she denies  lightheadedness, hypotension, syncope. Blood pressure today is well controlled. BP Readings from Last 3 Encounters:  05/28/19 132/68  04/16/19 112/76  04/08/19 103/71   Pt denies CP, SOB, exertional sx, LE edema, palpitation, Ha's, visual disturbances  Patient denies any associated rash, skin changes, lymphadenopathy, chest pain, shortness of breath, headaches, abdominal pain, nausea, vomiting, change in bowel movement, back pain.  She denies any joint swelling or stiffness.    AKI? Patient's most recent labs with her prior PCP did also show a decrease in GFR 45  -her baseline prior to that was GFR > 60, 60-75 over the past several years.    Ref. Range 02/21/2015 09:39 03/26/2016 08:45 09/25/2017 09:16 10/15/2018 09:04 03/24/2019 15:00 04/05/2019 09:24 04/30/2019 09:03  GFR Latest Ref Range: >60.00 mL/min 73.66 75.50 63.29 62.41 63.97  45.58 (L)      Patient Active Problem List   Diagnosis Date Noted  . New onset type 2 diabetes mellitus ([place]) 04/01/2019  . FUO (fever of unknown origin) 04/01/2019  . History of kidney stones 10/14/2018  . Insomnia 10/14/2018  . Essential hypertension 01/14/2008  . HLD (hyperlipidemia) 11/11/2006  . Osteopenia 11/11/2006    Past Surgical History:  Procedure Laterality Date  . BREAST BIOPSY Right 08/19/2012   neg, Dr. Bary Castilla  . extraction of renal stones    . TONSILLECTOMY AND ADENOIDECTOMY    . VAGINAL HYSTERECTOMY  1980   class IV, ovaries intact    Family History  Problem Relation Age of Onset  . Hypertension Mother   . Dementia Mother   . Stroke Mother   . Cancer - Other Father        Bile Duct  . Heart disease Maternal Grandmother   . Heart disease Maternal Grandfather   . Hypertension Paternal Grandmother   . Cancer Maternal Aunt        breast and lung  . Stroke Paternal Grandfather      Social History   Socioeconomic  History  . Marital status: Married    Spouse name: Richardson Landry  . Number of children: 2  . Years of education: Not on file  . Highest education level: Some college, no degree  Occupational History  . Occupation: Naval architect  Social Needs  . Financial resource strain: Not hard at all  . Food insecurity    Worry: Never true    Inability: Never true  . Transportation needs    Medical: No    Non-medical: No  Tobacco Use  . Smoking status: Former Smoker    Packs/day: 0.25    Years: 30.00    Pack years: 7.50    Types: Cigarettes    Start date: 07/09/1983    Quit date: 05/27/2014    Years since quitting: 5.0  . Smokeless tobacco: Never Used  Substance and Sexual Activity  . Alcohol use: Yes    Alcohol/week: 1.0 standard drinks    Types: 1 Glasses of wine per week    Comment: occasional  . Drug use: No  . Sexual activity: Yes    Partners: Male  Lifestyle  . Physical activity    Days per week: 7 days    Minutes per session: 60 min  . Stress: Not at all  Relationships  . Social connections    Talks on phone: More than three times a week    Gets together: More than three times a week    Attends religious service: More than 4 times per year    Active member of club or organization: Yes    Attends meetings of clubs or organizations: More than 4 times per year    Relationship status: Married  . Intimate partner violence    Fear of current or ex partner: No    Emotionally abused: No    Physically abused: No    Forced sexual activity: No  Other Topics Concern  . Not on file  Social History Narrative  . Not on file     Current Outpatient Medications:  .  atorvastatin (LIPITOR) 10 MG tablet, TAKE 1 TABLET (10 MG TOTAL) BY MOUTH DAILY. MUST SCHEDULE ANNUAL PHYSICAL, Disp: 90 tablet, Rfl: 0 .  blood glucose meter kit and supplies KIT, Lifescan meter Check blood sugar 2 times daily as needed, Disp: 1 each, Rfl: 0 .  calcium carbonate  (CALCIUM 600) 600 MG TABS tablet, Take 1 tablet (600 mg total) by mouth 2 (two) times daily with a meal., Disp: 30 tablet, Rfl: 0 .  Cholecalciferol (VITAMIN D) 50 MCG (2000 UT) CAPS, Take by mouth., Disp: 30 capsule, Rfl:  .  Lancets (ONETOUCH DELICA PLUS ASTMHD62I) MISC, USE AS DIRECTED TWICE A DAY AS NEEDED, Disp: , Rfl:  .  lisinopril (ZESTRIL) 10 MG tablet, Take 1 tablet (10 mg total) by mouth daily., Disp: 30 tablet, Rfl: 2 .  Multiple Vitamin (MULTIVITAMIN) tablet, Take 1 tablet by mouth daily.  , Disp: , Rfl:  .  ONETOUCH ULTRA test strip, USE AS DIRECTED TWICE A DAY AS NEEDED, Disp: , Rfl:  .  OVER THE COUNTER MEDICATION, Take 100 mg by mouth daily. Potassium, Disp: , Rfl:  .  OVER THE COUNTER MEDICATION, Take 400 mg by mouth daily. Magnesium, Disp: , Rfl:  .  traZODone (DESYREL) 50 MG tablet, TAKE 0.5-1 TABLETS (25-50 MG TOTAL) BY MOUTH AT BEDTIME., Disp: 90 tablet, Rfl: 0  No Known Allergies  I personally reviewed active problem list, medication list, allergies, family history, social history, health maintenance, notes from  last encounter, lab results, imaging with the patient/caregiver today.  Review of Systems  Constitutional: Negative.  Negative for activity change, appetite change, fatigue and unexpected weight change.  HENT: Negative.   Eyes: Negative.   Respiratory: Negative.  Negative for shortness of breath.   Cardiovascular: Negative.  Negative for chest pain, palpitations and leg swelling.  Gastrointestinal: Negative.  Negative for abdominal pain, blood in stool, constipation, diarrhea, nausea and vomiting.  Endocrine: Negative.   Genitourinary: Negative.  Negative for dysuria.  Musculoskeletal: Positive for myalgias. Negative for arthralgias, gait problem and joint swelling.  Skin: Negative.  Negative for color change, pallor and rash.  Allergic/Immunologic: Negative.   Neurological: Positive for weakness. Negative for syncope.  Hematological: Negative.  Negative for  adenopathy.  Psychiatric/Behavioral: Negative.  Negative for confusion, dysphoric mood, self-injury and suicidal ideas. The patient is not nervous/anxious.      Objective:    Vitals:   05/28/19 1103  BP: 132/68  Pulse: 94  Resp: 16  Temp: (!) 97.3 F (36.3 C)  TempSrc: Temporal  SpO2: 96%  Weight: 189 lb 1.6 oz (85.8 kg)  Height: 5' 4"  (1.626 m)    Body mass index is 32.46 kg/m.  Physical Exam Vitals signs and nursing note reviewed.  Constitutional:      General: She is not in acute distress.    Appearance: Normal appearance. She is well-developed. She is not ill-appearing, toxic-appearing or diaphoretic.     Interventions: Face mask in place.  HENT:     Head: Normocephalic and atraumatic.     Right Ear: External ear normal.     Left Ear: External ear normal.     Nose: Nose normal.     Mouth/Throat:     Mouth: Mucous membranes are moist.     Pharynx: Oropharynx is clear. No oropharyngeal exudate or posterior oropharyngeal erythema.  Eyes:     General: Lids are normal. No scleral icterus.       Right eye: No discharge.        Left eye: No discharge.     Conjunctiva/sclera: Conjunctivae normal.  Neck:     Musculoskeletal: Normal range of motion and neck supple.     Trachea: Phonation normal. No tracheal deviation.  Cardiovascular:     Rate and Rhythm: Normal rate and regular rhythm.     Pulses: Normal pulses.          Radial pulses are 2+ on the right side and 2+ on the left side.       Posterior tibial pulses are 2+ on the right side and 2+ on the left side.     Heart sounds: Normal heart sounds. No murmur. No friction rub. No gallop.   Pulmonary:     Effort: Pulmonary effort is normal. No respiratory distress.     Breath sounds: Normal breath sounds. No stridor. No wheezing, rhonchi or rales.  Chest:     Chest wall: No tenderness.  Abdominal:     General: Bowel sounds are normal. There is no distension.     Palpations: Abdomen is soft.     Tenderness: There  is no abdominal tenderness. There is no guarding or rebound.  Musculoskeletal: Normal range of motion.        General: No deformity.     Right lower leg: No edema.     Left lower leg: No edema.     Comments: Muscle compartments soft and nontender to bilateral lower extremities No increased tension or fasciculations visualized or  palpated  Lymphadenopathy:     Cervical: No cervical adenopathy.     Upper Body:     Right upper body: No supraclavicular, axillary or pectoral adenopathy.     Left upper body: No supraclavicular, axillary or pectoral adenopathy.  Skin:    General: Skin is warm and dry.     Capillary Refill: Capillary refill takes less than 2 seconds.     Coloration: Skin is not jaundiced or pale.     Findings: No bruising, erythema or rash.  Neurological:     Mental Status: She is alert and oriented to person, place, and time.     Cranial Nerves: Cranial nerves are intact.     Motor: Weakness (generalized and very slight,, needs help getting up and down from exam table) present. No abnormal muscle tone.     Coordination: Coordination is intact.     Gait: Gait is intact. Gait normal.  Psychiatric:        Mood and Affect: Mood normal.        Speech: Speech normal.        Behavior: Behavior normal.      Recent Results (from the past 2160 hour(s))  Novel Coronavirus, NAA ([facility])     Status: None   Collection Time: 03/22/19 12:00 AM   Specimen: Oropharyngeal(OP) collection in vial transport medium   OROPHARYNGEA  TESTING  Result Value Ref Range   SARS-CoV-2, NAA Not Detected Not Detected    Comment: Testing was performed using the cobas(R) SARS-CoV-2 test. This nucleic acid amplification test was developed and its performance characteristics determined by [employer]. Nucleic acid amplification tests include PCR and TMA. This test has not been FDA cleared or approved. This test has been authorized by FDA under an Emergency Use Authorization (EUA). This test is  only authorized for the duration of time the declaration that circumstances exist justifying the authorization of the emergency use of in vitro diagnostic tests for detection of SARS-CoV-2 virus and/or diagnosis of COVID-19 infection under section 564(b)(1) of the Act, 21 U.S.C. 893YBO-1(B) (1), unless the authorization is terminated or revoked sooner. When diagnostic testing is negative, the possibility of a false negative result should be considered in the context of a patient's recent exposures and the presence of clinical signs and symptoms consistent with COVID-19. An individual without symptoms  of COVID-19 and who is not shedding SARS-CoV-2 virus would expect to have a negative (not detected) result in this assay.   Urine Culture     Status: None   Collection Time: 03/24/19  3:00 PM   Specimen: Urine  Result Value Ref Range   MICRO NUMBER: [id number]    SPECIMEN QUALITY: Adequate    Sample Source URINE    STATUS: FINAL    Result: No Growth   High sensitivity CRP     Status: Abnormal   Collection Time: 03/24/19  3:00 PM  Result Value Ref Range   CRP, High Sensitivity 5.950 (H) 0.000 - 5.000 mg/L    Comment: Note:  An elevated hs-CRP (>5 mg/L) should be repeated after 2 weeks to rule out recent infection or trauma.  Comprehensive metabolic panel     Status: Abnormal   Collection Time: 03/24/19  3:00 PM  Result Value Ref Range   Sodium 138 135 - 145 mEq/L   Potassium 4.1 3.5 - 5.1 mEq/L   Chloride 104 96 - 112 mEq/L   CO2 28 19 - 32 mEq/L   Glucose, Bld 156 (H) 70 - 99  mg/dL   BUN 17 6 - 23 mg/dL   Creatinine, Ser 0.88 0.40 - 1.20 mg/dL   Total Bilirubin 0.5 0.2 - 1.2 mg/dL   Alkaline Phosphatase 56 39 - 117 U/L   AST 24 0 - 37 U/L   ALT 33 0 - 35 U/L   Total Protein 6.6 6.0 - 8.3 g/dL   Albumin 3.6 3.5 - 5.2 g/dL   Calcium 8.8 8.4 - 10.5 mg/dL   GFR 63.97 >60.00 mL/min  Sedimentation rate     Status: None   Collection Time: 03/24/19  3:00 PM  Result Value Ref Range    Sed Rate 16 0 - 30 mm/hr  CBC with Differential     Status: Abnormal   Collection Time: 03/24/19  3:00 PM  Result Value Ref Range   WBC 5.7 4.0 - 10.5 K/uL   RBC 4.27 3.87 - 5.11 Mil/uL   Hemoglobin 13.0 12.0 - 15.0 g/dL   HCT 39.1 36.0 - 46.0 %   MCV 91.6 78.0 - 100.0 fl   MCHC 33.3 30.0 - 36.0 g/dL   RDW 13.4 11.5 - 15.5 %   Platelets 210.0 150.0 - 400.0 K/uL   Neutrophils Relative % 47.9 43.0 - 77.0 %   Lymphocytes Relative 28.1 12.0 - 46.0 %   Monocytes Relative 14.5 (H) 3.0 - 12.0 %   Eosinophils Relative 5.9 (H) 0.0 - 5.0 %   Basophils Relative 3.6 (H) 0.0 - 3.0 %   Neutro Abs 2.7 1.4 - 7.7 K/uL   Lymphs Abs 1.6 0.7 - 4.0 K/uL   Monocytes Absolute 0.8 0.1 - 1.0 K/uL   Eosinophils Absolute 0.3 0.0 - 0.7 K/uL   Basophils Absolute 0.2 (H) 0.0 - 0.1 K/uL  Hemoglobin A1c     Status: Abnormal   Collection Time: 03/26/19  8:03 AM  Result Value Ref Range   Hgb A1c MFr Bld 6.6 (H) 4.6 - 6.5 %    Comment: Glycemic Control Guidelines for People with Diabetes:Non Diabetic:  <6%Goal of Therapy: <7%Additional Action Suggested:  >8%   Pathologist smear review     Status: None   Collection Time: 04/05/19  8:38 AM  Result Value Ref Range   Path Review Peripheral blood smear is reviewed.     Comment: Leukocytosis with absolute lymphocytosis including reactive lymphocytes and rare large granular lymphocytes. No increase in blasts. Normochromic, normocytic RBCs without anisopoikilocytosis. Normal platelet count and morphology. Reviewed by Kathi Simpers, M.D. Performed at [facility], [address] [place], [place] [address]   Urinalysis, Routine w reflex microscopic     Status: Abnormal   Collection Time: 04/05/19  9:08 AM  Result Value Ref Range   Color, Urine YELLOW (A) YELLOW   APPearance HAZY (A) CLEAR   Specific Gravity, Urine 1.023 1.005 - 1.030   pH 6.0 5.0 - 8.0   Glucose, UA NEGATIVE NEGATIVE mg/dL   Hgb urine dipstick NEGATIVE NEGATIVE   Bilirubin Urine  NEGATIVE NEGATIVE   Ketones, ur NEGATIVE NEGATIVE mg/dL   Protein, ur NEGATIVE NEGATIVE mg/dL   Nitrite NEGATIVE NEGATIVE   Leukocytes,Ua NEGATIVE NEGATIVE    Comment: Performed at [facility], [address] [place], [place] [address]  ANA Comprehensive Panel     Status: Abnormal   Collection Time: 04/05/19  9:24 AM  Result Value Ref Range   ds DNA Ab <1 0 - 9 IU/mL    Comment: (NOTE)  Negative      <5                                   Equivocal  5 - 9                                   Positive      >9    Ribonucleic Protein >8.0 (H) 0.0 - 0.9 AI   ENA SM Ab Ser-aCnc <0.2 0.0 - 0.9 AI   Scleroderma (Scl-70) (ENA) Antibody, IgG <0.2 0.0 - 0.9 AI   SSA (Ro) (ENA) Antibody, IgG <0.2 0.0 - 0.9 AI   SSB (La) (ENA) Antibody, IgG <0.2 0.0 - 0.9 AI   Chromatin Ab SerPl-aCnc <0.2 0.0 - 0.9 AI   Anti JO-1 <0.2 0.0 - 0.9 AI   Centromere Ab Screen <0.2 0.0 - 0.9 AI   See below: Comment     Comment: (NOTE) Autoantibody                       Disease Association ------------------------------------------------------------                        Condition                  Frequency ---------------------   ------------------------   --------- Antinuclear Antibody,    SLE, mixed connective Direct (ANA-D)           tissue diseases ---------------------   ------------------------   --------- dsDNA                    SLE                        40 - 60% ---------------------   ------------------------   --------- Chromatin                Drug induced SLE                90%                         SLE                        48 - 97% ---------------------   ------------------------   --------- SSA (Ro)                 SLE                        25 - 35%                         Sjogren's Syndrome         40 - 70%                         Neonatal Lupus                 100% ---------------------   ------------------------   --------- SSB (La)                  SLE  10%                         Sjogren's Syndrome              30% ---------------------   -----------------------    --------- Sm (anti-Smith)          SLE                        15 - 30% ---------------------   -----------------------    --------- RNP                      Mixed Connective Tissue                         Disease                         95% (U1 nRNP,                SLE                        30 - 50% anti-ribonucleoprotein)  Polymyositis and/or                         Dermatomyositis                 20% ---------------------   ------------------------   --------- Scl-70 (antiDNA          Scleroderma (diffuse)      20 - 35% topoisomerase)           [place]rest                           13% ---------------------   ------------------------   --------- Jo-1                     Polymyositis and/or                         Dermatomyositis            20 - 40% ---------------------   ------------------------   --------- Centromere B             Scleroderma -  Crest                         variant                         80% Performed At: [facility] [place], [place] [id number] Rush Farmer MD RD:[id number]   Cmv antibody, IgG (EIA)     Status: Abnormal   Collection Time: 04/05/19  9:24 AM  Result Value Ref Range   CMV Ab - IgG 5.30 (H) 0.00 - 0.59 U/mL    Comment: (NOTE)                               Negative          <0.60                               Equivocal  0.60 - 0.69                               Positive          >0.69 Performed At: [facility] [place], [place] [id number] Rush Farmer MD FS:[id number]   CMV IgM     Status: Abnormal   Collection Time: 04/05/19  9:24 AM  Result Value Ref Range   CMV IgM >240.0 (H) 0.0 - 29.9 AU/mL    Comment: (NOTE)                                Negative         <30.0                                Equivocal  30.0 - 34.9                                 Positive         >34.9 A positive result is generally indicative of acute infection, reactivation or persistent IgM production. Performed At: [facility] [place], [place] [id number] Rush Farmer MD [id number]   CBC with Differential/Platelet     Status: Abnormal   Collection Time: 04/05/19  9:24 AM  Result Value Ref Range   WBC 14.6 (H) 4.0 - 10.5 K/uL   RBC 4.73 3.87 - 5.11 MIL/uL   Hemoglobin 14.0 12.0 - 15.0 g/dL   HCT 42.7 36.0 - 46.0 %   MCV 90.3 80.0 - 100.0 fL   MCH 29.6 26.0 - 34.0 pg   MCHC 32.8 30.0 - 36.0 g/dL   RDW 13.5 11.5 - 15.5 %   Platelets 266 150 - 400 K/uL   nRBC 0.0 0.0 - 0.2 %   Neutrophils Relative % 32 %   Neutro Abs 4.7 1.7 - 7.7 K/uL   Lymphocytes Relative 55 %   Lymphs Abs 8.0 (H) 0.7 - 4.0 K/uL   Monocytes Relative 9 %   Monocytes Absolute 1.3 (H) 0.1 - 1.0 K/uL   Eosinophils Relative 2 %   Eosinophils Absolute 0.3 0.0 - 0.5 K/uL   Basophils Relative 1 %   Basophils Absolute 0.2 (H) 0.0 - 0.1 K/uL   RBC Morphology MORPHOLOGY UNREMARKABLE    Smear Review Normal platelet morphology    Immature Granulocytes 1 %   Abs Immature Granulocytes 0.13 (H) 0.00 - 0.07 K/uL   Reactive, Benign Lymphocytes PRESENT     Comment: Performed at [facility], [address] [place], [place] [address]  Comprehensive metabolic panel     Status: Abnormal   Collection Time: 04/05/19  9:24 AM  Result Value Ref Range   Sodium 134 (L) 135 - 145 mmol/L   Potassium 4.6 3.5 - 5.1 mmol/L   Chloride 100 98 - 111 mmol/L   CO2 27 22 - 32 mmol/L   Glucose, Bld 117 (H) 70 - 99 mg/dL   BUN 15 8 - 23 mg/dL   Creatinine, Ser 1.03 (H) 0.44 - 1.00 mg/dL   Calcium 9.1 8.9 - 10.3 mg/dL   Total Protein 7.2 6.5 - 8.1 g/dL   Albumin 3.4 (L) 3.5 - 5.0 g/dL   AST 43 (H) 15 -  41 U/L   ALT 56 (H) 0 - 44 U/L   Alkaline Phosphatase 50 38 - 126 U/L   Total Bilirubin 0.8 0.3 - 1.2 mg/dL   GFR calc non Af Amer 56 (L) >60 mL/min   GFR calc Af Amer  >60 >60 mL/min   Anion gap 7 5 - 15    Comment: Performed at [facility], [address] [place], [place] [address]  Ehrlichia antibody panel     Status: None   Collection Time: 04/05/19  9:24 AM  Result Value Ref Range   E chaffeensis (HGE) Ab, IgG Negative Neg:<1:64    Comment: (NOTE) HGE IgG levels are detectable 7 to 10 days post infection and persist approximately one year.    E chaffeensis (HGE) Ab, IgM Negative Neg:<1:20    Comment: (NOTE) Due to a reagent backorder, this test was performed using a different assay. The reference interval for this alternate assay is:                                       Negative      <1:64                                       Positive       1:64 or greater IgM levels usually rise 3 to 5 days post infection and fall to normal levels in approximately 30 to 60 days. Performed At: [facility] [place], [place] [id number] Rush Farmer MD GN:[id number]    E.Chaffeensis (HME) IgG Negative Neg:<1:64   E. Chaffeensis (HME) IgM Titer Negative Neg:<1:20    Comment: (NOTE) IgG titers if 1:64 or greater indicate exposure or  acute and convalescent samples showing a four-fold increase, and/or the presence of IgM indicate recent or current infection.   Ferritin     Status: Abnormal   Collection Time: 04/05/19  9:24 AM  Result Value Ref Range   Ferritin 606 (H) 11 - 307 ng/mL    Comment: Performed at [facility], [address] [place], [place] [address]  HIV antibody (with reflex)     Status: None   Collection Time: 04/05/19  9:24 AM  Result Value Ref Range   HIV Screen 4th Generation wRfx Non Reactive Non Reactive    Comment: (NOTE) Performed At: [facility] [place], [place] [id number] Rush Farmer MD GM:[id number]   Lactate Dehydrogenase (LDH)     Status: Abnormal   Collection Time: 04/05/19  9:24 AM  Result Value Ref Range   LDH 327 (H) 98 - 192 U/L    Comment:  Performed at [facility], [address] [place], [place] [address]  QuantiFERON-TB Girtha Rm Plus     Status: None   Collection Time: 04/05/19  9:24 AM  Result Value Ref Range   QuantiFERON Incubation Incubation performed.    QuantiFERON-TB Gold Plus Negative Negative    Comment: (NOTE) Performed At: [facility] [place], [place] [id number] Rush Farmer MD VF:[id number]   TSH     Status: None   Collection Time: 04/05/19  9:24 AM  Result Value Ref Range   TSH 1.193 0.350 - 4.500 uIU/mL    Comment: Performed by a 3rd Generation assay with a functional sensitivity of <=0.01 uIU/mL. Performed at  [facility], [address], [place], [place] [address]   Blood culture (routine single)     Status: None   Collection Time: 04/05/19  9:24 AM   Specimen: BLOOD  Result Value Ref Range   Specimen Description BLOOD LEFT ANTECUBITAL    Special Requests      BOTTLES DRAWN AEROBIC AND ANAEROBIC Blood Culture adequate volume   Culture      NO GROWTH 5 DAYS Performed at [facility], [address] [place], [place] [address]    Report Status 04/10/2019 FINAL   Blood culture (routine single)     Status: None   Collection Time: 04/05/19  9:24 AM   Specimen: BLOOD  Result Value Ref Range   Specimen Description BLOOD RIGHT ANTECUBITAL    Special Requests      BOTTLES DRAWN AEROBIC AND ANAEROBIC Blood Culture adequate volume   Culture      NO GROWTH 5 DAYS Performed at [facility], [address], [place], [place] [address]    Report Status 04/10/2019 FINAL   QuantiFERON-TB Gold Plus     Status: None   Collection Time: 04/05/19  9:24 AM  Result Value Ref Range   QuantiFERON Criteria Comment     Comment: (NOTE) The QuantiFERON-TB Gold Plus result is determined by subtracting the Nil value from either TB antigen (Ag) tube. The mitogen tube serves as a control for the test.    QuantiFERON TB1 Ag Value 0.13 IU/mL   QuantiFERON  TB2 Ag Value 0.12 IU/mL   QuantiFERON Nil Value 0.10 IU/mL   QuantiFERON Mitogen Value 7.46 IU/mL    Comment: (NOTE) Performed At: [facility] [place], [place] [id number] Rush Farmer MD YC:[id number]   CMV DNA, quantitative, PCR     Status: None   Collection Time: 04/05/19  9:24 AM  Result Value Ref Range   CMV DNA Quant 1,080 Negative IU/mL    Comment: (NOTE) The quantitative range of this assay is 200 to 1 million IU/mL. This test was developed and its performance characteristics determined by [facility]. It has not been cleared or approved by the Food and Drug Administration. The FDA has determined that such clearance or approval is not necessary.    Log10 CMV Qn DNA Pl 3.033 log10 IU/mL    Comment: (NOTE) Performed At: [facility] [place], [place] [id number] Rush Farmer MD HY:[id number]   [place] Serology (COVID 19)AB(IGG)IA     Status: None   Collection Time: 04/05/19  9:24 AM  Result Value Ref Range   SARS-CoV-2 Ab, IgG NON REACTIVE NON REACTIVE    Comment: (NOTE) Non-Reactive for SARS-CoV-2 IgG Antibodies.  SARS-CoV-2 IgG antibodies not detected.  Negative results do not preclude acute SARS-CoV-2 infection.  Negative results may occur in samples collected too soon following infection or in immunosuppressed patients.  Serologic results should not be used to diagnose or exclude active/recent SARS-CoV-2 infection.  If acute infection is suspected, direct testing for SARS-CoV-2 is necessary.   The expected result is Non-Reactive.  Fact Sheet for Recipients:  LimitBuy.nl  Fact Sheet for Healthcare Providers:  WordAgents.no  Testing was performed using the Beckman Coulter SARS-CoV-2 IgG assay.  This test is not yet approved or cleared by the [place] and has been authorized by FDA under an Emergency Use Authorization (EUA).  This EUA will remain in effect  (meaning this test can be used) for the duration of the COVID-19 declaration under Section 564(b)(1) of  the  Act, 21 U.S.C. section 360bbb-3(b)(1), unless the authorization is terminated or revoked sooner. Performed at [facility], [address] [place],  [address]   EPSTEIN-BARR VIRUS (EBV) Antibody Profile     Status: Abnormal   Collection Time: 04/05/19  9:24 AM  Result Value Ref Range   EBV VCA IgM >160.0 (H) 0.0 - 35.9 U/mL    Comment: (NOTE)                                 Negative        <36.0                                 Equivocal 36.0 - 43.9                                 Positive        >43.9    EBV VCA IgG 580.0 (H) 0.0 - 17.9 U/mL    Comment: (NOTE)                                 Negative        <18.0                                 Equivocal 18.0 - 21.9                                 Positive        >21.9    EBV NA IgG 526.0 (H) 0.0 - 17.9 U/mL    Comment: (NOTE)                                 Negative        <18.0                                 Equivocal 18.0 - 21.9                                 Positive        >21.9    Interpretation: Comment     Comment: (NOTE)               EBV Interpretation Chart Key: Antibody Present +    Antibody Absent - Interpretation             VCA-IgM   VCA-IgG  EBNA-IgG No previous infection/        -         -         - Susceptible Primary infection (new        +         +         - or recent) Past Infection               +or-       +         +  See comment below*            +         -         - *Results indicate infection with EBV at some time however cannot predict the timing of the infection since antibodies to EBNA usually develop after primary infection or, alternatively, approximately 5-10% of patients with EBV never develop antibodies to EBNA. Performed At: [facility] [place], [place] [id number] Rush Farmer MD EK:[id number]   Miscellaneous [facility] test  (send-out)     Status: None   Collection Time: 04/05/19  9:24 AM  Result Value Ref Range   [facility] test code [id number]    [facility] test name Anti Nuclear Ab by IFA (RDL)    Misc [facility] result See Scanned report in [place]: Performed at [facility], [place] [address], [place], [place] [address]  Hepatitis panel, acute     Status: None   Collection Time: 04/05/19  9:24 AM  Result Value Ref Range   Hepatitis B Surface Ag Negative     Comment: Reference Range:  Negative   HCV Ab <0.1 0.0 - 0.9 s/co ratio    Comment: (NOTE)                                  Negative:     < 0.8                             Indeterminate: 0.8 - 0.9                                  Positive:     > 0.9 The CDC recommends that a positive HCV antibody result be followed up with a HCV Nucleic Acid Amplification test (655374). Performed At: [facility] [place], [place] [id number] Rush Farmer MD QG:[id number]    Hep A IgM Negative Negative   Hep B C IgM Negative     Comment: Reference Range:  Negative Performed at [facility], [place] [address] [place], [place] [address]   RNP Antibody     Status: Abnormal   Collection Time: 04/30/19  9:03 AM  Result Value Ref Range   Ribonucleic Protein(ENA) Antibody, IgG >8.0 POS (A) <1.0 NEG AI  Comprehensive metabolic panel     Status: Abnormal   Collection Time: 04/30/19  9:03 AM  Result Value Ref Range   Sodium 138 135 - 145 mEq/L   Potassium 4.1 3.5 - 5.1 mEq/L   Chloride 101 96 - 112 mEq/L   CO2 31 19 - 32 mEq/L   Glucose, Bld 147 (H) 70 - 99 mg/dL   BUN 13 6 - 23 mg/dL   Creatinine, Ser 1.18 0.40 - 1.20 mg/dL   Total Bilirubin 0.7 0.2 - 1.2 mg/dL   Alkaline Phosphatase 53 39 - 117 U/L   AST 24 0 - 37 U/L   ALT 32 0 - 35 U/L   Total Protein 7.2 6.0 - 8.3 g/dL   Albumin 4.0 3.5 - 5.2 g/dL   Calcium 9.7 8.4 - 10.5 mg/dL   GFR 45.58 (L) >60.00 mL/min  ANA     Status: None   Collection Time: 04/30/19  9:03  AM  Result Value Ref Range   Anti Nuclear Antibody (ANA) NEGATIVE NEGATIVE    Comment: ANA IFA is a first line screen for detecting the presence of up to approximately 150 autoantibodies in various autoimmune diseases. A negative ANA IFA result suggests an ANA-associated autoimmune disease is not present at this time, but is not definitive. If there is high clinical suspicion for Sjogren's syndrome, testing for anti-SS-A/Ro antibody should be considered. Anti-Jo-1 antibody should be considered for clinically suspected inflammatory myopathies. . AC-0: Negative . International Consensus on ANA Patterns (https://www.hernandez-brewer.com/) . For additional information, please refer to http://education.[facility].com/faq/FAQ177 (This link is being provided for informational/ educational purposes only.) .      PHQ2/9: Depression screen [facility] 2/9 05/28/2019 10/14/2018 09/25/2017 07/10/2017 03/02/2015  Decreased Interest 1 0 0 0 0  Down, Depressed, Hopeless 0 0 0 0 0  PHQ - 2 Score 1 0 0 0 0  Altered sleeping 3 - - - -  Tired, decreased energy 2 - - - -  Change in appetite 0 - - - -  Feeling bad or failure about yourself  0 - - - -  Trouble concentrating 0 - - - -  Moving slowly or fidgety/restless 1 - - - -  Suicidal thoughts 0 - - - -  PHQ-9 Score 7 - - - -  Difficult doing work/chores Somewhat difficult - - - -    phq 9 is negative Reviewed today  Fall Risk: Fall Risk  05/28/2019 09/25/2017 07/10/2017  Falls in the past year? 1 No No  Number falls in past yr: 0 - -  Comment First October 2020 - -  Injury with Fall? 1 - -  Comment Contusion on her buttock - -  Risk for fall due to : Other (Comment) - -  Risk for fall due to: Comment Left leg locked up - -      Functional Status Survey: Is the patient deaf or have difficulty hearing?: No Does the patient have difficulty seeing, even when wearing glasses/contacts?: No Does the patient have difficulty  concentrating, remembering, or making decisions?: No Does the patient have difficulty walking or climbing stairs?: No Does the patient have difficulty dressing or bathing?: No Does the patient have difficulty doing errands alone such as visiting a doctor's office or shopping?: No    Assessment & Plan:     ICD-10-CM   1. Essential hypertension  I10 CMET - [facility]   well controlled with 10 mg lisinopril - check labs, renal function and electrolytes   2. Hyperlipidemia, unspecified hyperlipidemia type  E78.5 CMET - [facility]   has been well controlled on lipitor for years, having her hold meds while working up myalgias   3. Myalgia  M79.10 Mag    CK (Creatine Kinase)    CMET - [facility]    CBC w/Diff/Platelet   post viral sx? drug SE myalgia? or polymyositis? connective tissue disease?  checking CK and CMP - autoimmune labs done recently, no muscle tenderness, muscle compartments soft, some appreciable generalized weakness on exam w/o any focal neurological deficit   4. Muscle spasm  M62.838 Mag  Recheck electrolytes with med changes and pt drinking large amts of water, also OTC supplements  CK (Creatine Kinase)    CMET - [facility]    CBC w/Diff/Platelet  5. Encounter for medication monitoring  Z51.81 Mag    CK (Creatine Kinase)    CMET - [facility]     CBC w/Diff/Platelet  6. New onset type 2 diabetes mellitus ([place])  E11.9 CMET - [facility]   new dx DM, A1C 6.6, discussed at length diet efforts, reduces simple sugars and carbs, how to monitor with new glucometer (she has not even tried to use it yet and got 2 months ago), handout given, encouraged to return on 1 mo w/ meter for dm f/up   7. Encounter to establish care with new doctor  Z76.89    Extensive chart review today    8. Positive sm/RNP antibody  Consulted with PCP today, will need to consult with ID as well per past PCP - consider rheumatology referral after todays labs come back  R76.8   9. Cytomegalovirus infection, unspecified  cytomegaloviral infection type ([place])  B25.9    in past 2 months, did cause mild hepatitis, but LFTs have normalized, fevers resolved and viral load decreased   10. Elevated LDH  Unclear if secondary to infection, will need to f/up on this as well  R74.02   11. AKI (acute kidney injury) ([place])  N17.9    decreased GFR, recheck today, pt decreased HTN meds and pushed fluids, check for improvement but if renal function continued to be declined related to CTD?     Plan for now is to check labs, hold lipitor, continue lisinopril 10 mg daily, monitor CBG 2-3 x a week, f/up in one month as she will be due for repeat A1C and lipids  Greater than 50% of this visit was spent in direct face-to-face counseling, obtaining history and physical, discussing and educating pt on treatment plan.  Total time of this visit was 50 min.  Remainder of time involved but was not limited to reviewing chart (recent and pertinent OV notes and labs), documentation in EMR, and coordinating care and treatment plan.   Return in about 1 month (around 06/27/2019) for f/up for other acute issues or concerns.   Delsa Grana, PA-C 05/28/19 11:25 AM

## 2019-05-28 NOTE — Patient Instructions (Signed)
I recommend checking your blood sugar in the morning 2-3 times a week  Blood Glucose Monitoring, Adult Monitoring your blood sugar (glucose) is an important part of managing your diabetes (diabetes mellitus). Blood glucose monitoring involves checking your blood glucose as often as directed and keeping a record (log) of your results over time. Checking your blood glucose regularly and keeping a blood glucose log can:  Help you and your health care provider adjust your diabetes management plan as needed, including your medicines or insulin.  Help you understand how food, exercise, illnesses, and medicines affect your blood glucose.  Let you know what your blood glucose is at any time. You can quickly find out if you have low blood glucose (hypoglycemia) or high blood glucose (hyperglycemia). Your health care provider will set individualized treatment goals for you. Your goals will be based on your age, other medical conditions you have, and how you respond to diabetes treatment. Generally, the goal of treatment is to maintain the following blood glucose levels:  Before meals (preprandial): 80-130 mg/dL (4.4-7.2 mmol/L).  After meals (postprandial): below 180 mg/dL (10 mmol/L).  A1c level: less than 7%. Supplies needed:  Blood glucose meter.  Test strips for your meter. Each meter has its own strips. You must use the strips that came with your meter.  A needle to prick your finger (lancet). Do not use a lancet more than one time.  A device that holds the lancet (lancing device).  A journal or log book to write down your results. How to check your blood glucose  1. Wash your hands with soap and water. 2. Prick the side of your finger (not the tip) with the lancet. Use a different finger each time. 3. Gently rub the finger until a small drop of blood appears. 4. Follow instructions that come with your meter for inserting the test strip, applying blood to the strip, and using your blood  glucose meter. 5. Write down your result and any notes. Some meters allow you to use areas of your body other than your finger (alternative sites) to test your blood. The most common alternative sites are:  Forearm.  Thigh.  Palm of the hand. If you think you may have hypoglycemia, or if you have a history of not knowing when your blood glucose is getting low (hypoglycemia unawareness), do not use alternative sites. Use your finger instead. Alternative sites may not be as accurate as the fingers, because blood flow is slower in these areas. This means that the result you get may be delayed, and it may be different from the result that you would get from your finger. Follow these instructions at home: Blood glucose log   Every time you check your blood glucose, write down your result. Also write down any notes about things that may be affecting your blood glucose, such as your diet and exercise for the day. This information can help you and your health care provider: ? Look for patterns in your blood glucose over time. ? Adjust your diabetes management plan as needed.  Check if your meter allows you to download your records to a computer. Most glucose meters store a record of glucose readings in the meter. If you have type 1 diabetes:  Check your blood glucose 2 or more times a day.  Also check your blood glucose: ? Before every insulin injection. ? Before and after exercise. ? Before meals. ? 2 hours after a meal. ? Occasionally between 2:00 a.m. and 3:00  a.m., as directed. ? Before potentially dangerous tasks, like driving or using heavy machinery. ? At bedtime.  You may need to check your blood glucose more often, up to 6-10 times a day, if you: ? Use an insulin pump. ? Need multiple daily injections (MDI). ? Have diabetes that is not well-controlled. ? Are ill. ? Have a history of severe hypoglycemia. ? Have hypoglycemia unawareness. If you have type 2 diabetes:  If you  take insulin or other diabetes medicines, check your blood glucose 2 or more times a day.  If you are on intensive insulin therapy, check your blood glucose 4 or more times a day. Occasionally, you may also need to check between 2:00 a.m. and 3:00 a.m., as directed.  Also check your blood glucose: ? Before and after exercise. ? Before potentially dangerous tasks, like driving or using heavy machinery.  You may need to check your blood glucose more often if: ? Your medicine is being adjusted. ? Your diabetes is not well-controlled. ? You are ill. General tips  Always keep your supplies with you.  If you have questions or need help, all blood glucose meters have a 24-hour "hotline" phone number that you can call. You may also contact your health care provider.  After you use a few boxes of test strips, adjust (calibrate) your blood glucose meter by following instructions that came with your meter. Contact a health care provider if:  Your blood glucose is at or above 240 mg/dL (13.3 mmol/L) for 2 days in a row.  You have been sick or have had a fever for 2 days or longer, and you are not getting better.  You have any of the following problems for more than 6 hours: ? You cannot eat or drink. ? You have nausea or vomiting. ? You have diarrhea. Get help right away if:  Your blood glucose is lower than 54 mg/dL (3 mmol/L).  You become confused or you have trouble thinking clearly.  You have difficulty breathing.  You have moderate or large ketone levels in your urine. Summary  Monitoring your blood sugar (glucose) is an important part of managing your diabetes (diabetes mellitus).  Blood glucose monitoring involves checking your blood glucose as often as directed and keeping a record (log) of your results over time.  Your health care provider will set individualized treatment goals for you. Your goals will be based on your age, other medical conditions you have, and how you  respond to diabetes treatment.  Every time you check your blood glucose, write down your result. Also write down any notes about things that may be affecting your blood glucose, such as your diet and exercise for the day. This information is not intended to replace advice given to you by your health care provider. Make sure you discuss any questions you have with your health care provider. Document Released: 06/27/2003 Document Revised: 04/17/2018 Document Reviewed: 12/04/2015 Elsevier Patient Education  2020 [place].   Cytomegalovirus Infection, Adult Cytomegalovirus (CMV) is a common virus. Usually, a CMV infection does not cause problems in adults with a normal body defense system (immune system). However, in some people with a weak immune system, the infection can lead to serious conditions, such as blindness or swelling in the brain. If the virus spreads to a baby during pregnancy, it can cause pregnancy loss or growth problems or developmental disabilities in the baby. A CMV infection is more likely to be serious in:  People who have a  weak immune system, such as from: ? HIV (human immunodeficiency virus). ? Cancer treatment. ? Treatment related to an organ or stem cell transplant.  Babies of pregnant women infected with the virus. A CMV infection cannot be prevented with a vaccine. What are the causes? This condition is caused by CMV, which may also be called human herpesvirus 5. This virus can spread through body fluids such as blood, urine, breast milk, saliva, and semen. You can get CMV by coming into contact with the body fluids of someone who is infected. This can happen if:  You get body fluids from an infected person on your hands and then rub your eyes or touch the inside of your nose or mouth.  You have unprotected sex with an infected person.  You kiss an infected person on the mouth.  You receive infected blood.  You have an organ transplant or bone marrow  transplant from an infected donor. What are the signs or symptoms? Symptoms of this condition include:  Tiredness (fatigue).  Weakness.  Fever that lasts for several days.  Achy muscles.  Sore throat.  Sore or enlarged lymph nodes.  Weight loss.  Headache. Symptoms may be more severe in people who have a weak immune system. In those people, the virus may affect one part of the body. For example, it may affect:  The stomach or intestines. This can lead to an infection called gastroenteritis.  The liver. This can lead to a liver disease called hepatitis.  The lungs. This can lead to inflammation of the lungs (pneumonia).  The eyes. This can make it hard to see and can lead to blindness.  The brain (rare). This can lead to seizures or a coma. Most people who have this condition never have symptoms. How is this diagnosed? This condition is diagnosed based on:  Symptoms.  A physical exam.  Blood and urine tests. How is this treated? This condition may be managed with:  Medicines to relieve symptoms, prevent complications, and keep the virus from spreading.  Antiviral medicines to treat symptoms if you are at higher risk of having a severe infection. No treatment will completely remove the virus from your body. If you get the virus, you will have it for the rest of your life. Follow these instructions at home:   Take over-the-counter and prescription medicines only as told by your health care provider.  Drink enough fluid to keep your urine pale yellow.  Take actions to keep the virus from spreading to others. For example: ? Wash your hands often. ? Throw away used tissues. ? Avoid sharing eating and drinking utensils.  Keep all follow-up visits as told by your health care provider. This is important. Contact a health care provider if:  You develop any symptoms.  You have a fever.  You are extremely tired.  You have muscle aches and a headache.  Your  muscles are unusually weak.  You become confused.  You are very irritable.  Your vision gets blurry or you have trouble seeing.  You get pregnant or you wish to get pregnant. Summary  Cytomegalovirus (CMV) is a common virus.  A CMV infection does not usually cause problems in adults with a normal body defense system (immune system). However, in some people with a weak immune system, the infection can lead to serious conditions, such as blindness or swelling in the brain.  Most people with CMV never have symptoms. If it does cause symptoms, they are similar to a flu-like  illness.  No treatment will completely remove the virus from your body. If you get the virus, you will have it for the rest of your life. However, medicines can help relieve symptoms, prevent complications, and keep the virus from spreading. This information is not intended to replace advice given to you by your health care provider. Make sure you discuss any questions you have with your health care provider. Document Released: 06/12/2009 Document Revised: 06/26/2017 Document Reviewed: 06/26/2017 Elsevier Patient Education  [place].   Muscle Cramps and Spasms Muscle cramps and spasms are when muscles tighten by themselves. They usually get better within minutes. Muscle cramps are painful. They are usually stronger and last longer than muscle spasms. Muscle spasms may or may not be painful. They can last a few seconds or much longer. Cramps and spasms can affect any muscle, but they occur most often in the calf muscles of the leg. They are usually not caused by a serious problem. In many cases, the cause is not known. Some common causes include:  Doing more physical work or exercise than your body is ready for.  Using the muscles too much (overuse) by repeating certain movements too many times.  Staying in a certain position for a long time.  Playing a sport or doing an activity without preparing properly.   Using bad form or technique while playing a sport or doing an activity.  Not having enough water in your body (dehydration).  Injury.  Side effects of some medicines.  Low levels of the salts and minerals in your blood (electrolytes), such as low potassium or calcium. Follow these instructions at home: Managing pain and stiffness      Massage, stretch, and relax the muscle. Do this for many minutes at a time.  If told, put heat on tight or tense muscles as often as told by your doctor. Use the heat source that your doctor recommends, such as a moist heat pack or a heating pad. ? Place a towel between your skin and the heat source. ? Leave the heat on for 20-30 minutes. ? Remove the heat if your skin turns bright red. This is very important if you are not able to feel pain, heat, or cold. You may have a greater risk of getting burned.  If told, put ice on the affected area. This may help if you are sore or have pain after a cramp or spasm. ? Put ice in a plastic bag. ? Place a towel between your skin and the bag. ? Leave the ice on for 20 minutes, 2-3 times a day.  Try taking hot showers or baths to help relax tight muscles. Eating and drinking  Drink enough fluid to keep your pee (urine) pale yellow.  Eat a healthy diet to help ensure that your muscles work well. This should include: ? Fruits and vegetables. ? Lean protein. ? Whole grains. ? Low-fat or nonfat dairy products. General instructions  If you are having cramps often, avoid intense exercise for several days.  Take over-the-counter and prescription medicines only as told by your doctor.  Watch for any changes in your symptoms.  Keep all follow-up visits as told by your doctor. This is important. Contact a doctor if:  Your cramps or spasms get worse or happen more often.  Your cramps or spasms do not get better with time. Summary  Muscle cramps and spasms are when muscles tighten by themselves. They  usually get better within minutes.  Cramps and  spasms occur most often in the calf muscles of the leg.  Massage, stretch, and relax the muscle. This may help the cramp or spasm go away.  Drink enough fluid to keep your pee (urine) pale yellow. This information is not intended to replace advice given to you by your health care provider. Make sure you discuss any questions you have with your health care provider. Document Released: 06/06/2008 Document Revised: 11/17/2017 Document Reviewed: 11/17/2017 Elsevier Patient Education  2020 [employer].

## 2019-06-01 ENCOUNTER — Encounter: Payer: Self-pay | Admitting: Family Medicine

## 2019-06-01 DIAGNOSIS — I10 Essential (primary) hypertension: Secondary | ICD-10-CM | POA: Diagnosis not present

## 2019-06-01 DIAGNOSIS — Z5181 Encounter for therapeutic drug level monitoring: Secondary | ICD-10-CM | POA: Diagnosis not present

## 2019-06-01 DIAGNOSIS — M62838 Other muscle spasm: Secondary | ICD-10-CM | POA: Diagnosis not present

## 2019-06-01 DIAGNOSIS — E785 Hyperlipidemia, unspecified: Secondary | ICD-10-CM | POA: Diagnosis not present

## 2019-06-01 DIAGNOSIS — E119 Type 2 diabetes mellitus without complications: Secondary | ICD-10-CM | POA: Diagnosis not present

## 2019-06-01 DIAGNOSIS — M791 Myalgia, unspecified site: Secondary | ICD-10-CM | POA: Diagnosis not present

## 2019-06-01 MED ORDER — TIZANIDINE HCL 2 MG PO TABS: 2.0000 mg | ORAL_TABLET | Freq: Three times a day (TID) | ORAL | 0 refills | Status: DC | PRN

## 2019-06-02 LAB — CBC WITH DIFFERENTIAL/PLATELET
Basophils Absolute: 0.1 10*3/uL (ref 0.0–0.2)
Basos: 1 %
EOS (ABSOLUTE): 0.2 10*3/uL (ref 0.0–0.4)
Eos: 3 %
Hematocrit: 40.1 % (ref 34.0–46.6)
Hemoglobin: 13.9 g/dL (ref 11.1–15.9)
Immature Grans (Abs): 0 10*3/uL (ref 0.0–0.1)
Immature Granulocytes: 0 %
Lymphocytes Absolute: 3.4 10*3/uL — ABNORMAL HIGH (ref 0.7–3.1)
Lymphs: 51 %
MCH: 31 pg (ref 26.6–33.0)
MCHC: 34.7 g/dL (ref 31.5–35.7)
MCV: 89 fL (ref 79–97)
Monocytes Absolute: 0.6 10*3/uL (ref 0.1–0.9)
Monocytes: 8 %
Neutrophils Absolute: 2.5 10*3/uL (ref 1.4–7.0)
Neutrophils: 37 %
Platelets: 231 10*3/uL (ref 150–450)
RBC: 4.49 x10E6/uL (ref 3.77–5.28)
RDW: 13.1 % (ref 11.7–15.4)
WBC: 6.7 10*3/uL (ref 3.4–10.8)

## 2019-06-02 LAB — COMPREHENSIVE METABOLIC PANEL
ALT: 30 IU/L (ref 0–32)
AST: 20 IU/L (ref 0–40)
Albumin/Globulin Ratio: 1.4 (ref 1.2–2.2)
Albumin: 4.1 g/dL (ref 3.8–4.8)
Alkaline Phosphatase: 68 IU/L (ref 39–117)
BUN/Creatinine Ratio: 15 (ref 12–28)
BUN: 13 mg/dL (ref 8–27)
Bilirubin Total: 0.6 mg/dL (ref 0.0–1.2)
CO2: 23 mmol/L (ref 20–29)
Calcium: 9.7 mg/dL (ref 8.7–10.3)
Chloride: 101 mmol/L (ref 96–106)
Creatinine, Ser: 0.87 mg/dL (ref 0.57–1.00)
GFR calc Af Amer: 80 mL/min/{1.73_m2} (ref >59)
GFR calc non Af Amer: 69 mL/min/{1.73_m2} (ref >59)
Globulin, Total: 2.9 g/dL (ref 1.5–4.5)
Glucose: 144 mg/dL — ABNORMAL HIGH (ref 65–99)
Potassium: 4.5 mmol/L (ref 3.5–5.2)
Sodium: 139 mmol/L (ref 134–144)
Total Protein: 7 g/dL (ref 6.0–8.5)

## 2019-06-02 LAB — MAGNESIUM: Magnesium: 2 mg/dL (ref 1.6–2.3)

## 2019-06-02 LAB — CK: Total CK: 69 U/L (ref 32–182)

## 2019-06-14 ENCOUNTER — Other Ambulatory Visit: Payer: Self-pay

## 2019-06-14 ENCOUNTER — Emergency Department
Admission: EM | Admit: 2019-06-14 | Discharge: 2019-06-15 | Disposition: A | Discharge status: Home / Self Care | Payer: Medicare HMO | Attending: Emergency Medicine | Admitting: Emergency Medicine

## 2019-06-14 ENCOUNTER — Emergency Department: Payer: Medicare HMO

## 2019-06-14 DIAGNOSIS — Z87891 Personal history of nicotine dependence: Secondary | ICD-10-CM | POA: Diagnosis not present

## 2019-06-14 DIAGNOSIS — I10 Essential (primary) hypertension: Secondary | ICD-10-CM

## 2019-06-14 DIAGNOSIS — R42 Dizziness and giddiness: Secondary | ICD-10-CM | POA: Insufficient documentation

## 2019-06-14 DIAGNOSIS — G4489 Other headache syndrome: Secondary | ICD-10-CM | POA: Diagnosis not present

## 2019-06-14 DIAGNOSIS — R002 Palpitations: Secondary | ICD-10-CM | POA: Diagnosis not present

## 2019-06-14 DIAGNOSIS — Z79899 Other long term (current) drug therapy: Secondary | ICD-10-CM | POA: Diagnosis not present

## 2019-06-14 DIAGNOSIS — R0602 Shortness of breath: Secondary | ICD-10-CM | POA: Diagnosis not present

## 2019-06-14 DIAGNOSIS — R000 Tachycardia, unspecified: Secondary | ICD-10-CM | POA: Diagnosis not present

## 2019-06-14 HISTORY — DX: Essential (primary) hypertension: I10

## 2019-06-14 LAB — URINALYSIS, COMPLETE (UACMP) WITH MICROSCOPIC
Bacteria, UA: NONE SEEN
Bilirubin Urine: NEGATIVE
Glucose, UA: NEGATIVE mg/dL
Hgb urine dipstick: NEGATIVE
Ketones, ur: NEGATIVE mg/dL
Leukocytes,Ua: NEGATIVE
Nitrite: NEGATIVE
Protein, ur: NEGATIVE mg/dL
Specific Gravity, Urine: 1.014 (ref 1.005–1.030)
pH: 6 (ref 5.0–8.0)

## 2019-06-14 LAB — BASIC METABOLIC PANEL
Anion gap: 9 (ref 5–15)
BUN: 18 mg/dL (ref 8–23)
CO2: 23 mmol/L (ref 22–32)
Calcium: 9.1 mg/dL (ref 8.9–10.3)
Chloride: 106 mmol/L (ref 98–111)
Creatinine, Ser: 1.38 mg/dL — ABNORMAL HIGH (ref 0.44–1.00)
GFR calc Af Amer: 46 mL/min — ABNORMAL LOW (ref >60)
GFR calc non Af Amer: 39 mL/min — ABNORMAL LOW (ref >60)
Glucose, Bld: 136 mg/dL — ABNORMAL HIGH (ref 70–99)
Potassium: 3.8 mmol/L (ref 3.5–5.1)
Sodium: 138 mmol/L (ref 135–145)

## 2019-06-14 LAB — CBC
HCT: 41 % (ref 36.0–46.0)
Hemoglobin: 14 g/dL (ref 12.0–15.0)
MCH: 30.4 pg (ref 26.0–34.0)
MCHC: 34.1 g/dL (ref 30.0–36.0)
MCV: 88.9 fL (ref 80.0–100.0)
Platelets: 249 10*3/uL (ref 150–400)
RBC: 4.61 MIL/uL (ref 3.87–5.11)
RDW: 12.9 % (ref 11.5–15.5)
WBC: 8.3 10*3/uL (ref 4.0–10.5)
nRBC: 0 % (ref 0.0–0.2)

## 2019-06-14 LAB — TROPONIN I (HIGH SENSITIVITY): Troponin I (High Sensitivity): 6 ng/L (ref <18)

## 2019-06-14 NOTE — ED Provider Notes (Signed)
[facility] Emergency Department Provider Note   ____________________________________________   None    (approximate)  I have reviewed the triage vital signs and the nursing notes.   HISTORY  Chief Complaint Hypertension    HPI Jacqueline [name] is a 68 y.o. female who presents to the ED from home with a chief complaint of elevated blood pressure.  Patient takes lisinopril 10 mg daily and states she is compliant with her medicines.  Tonight she had a brief.  Of palpitations with irregular heartbeat with associated dizziness and lightheadedness.  Denies associated chest pain or shortness of breath.  Took her blood pressure which was 185/110.  Did take an extra one half lisinopril tablet around 7 PM before arrival.  In September patient had a month-long history of fevers, soft infectious disease and diagnosed with CMV infection.  Last month she was having bilateral muscle cramps in her lower legs and taken off of her cholesterol medicine.  Several months ago she was having spikes in blood pressure and her lisinopril was increased to 20 mg.  It was back down last month because her blood pressure was going low.  Denies fever, cough, abdominal pain, nausea, vomiting, diarrhea.  Denies recent travel or trauma.       Past Medical History:  Diagnosis Date  . Calcium oxalate renal stones   . Hyperlipidemia   . Hypertension   . MVP (mitral valve prolapse)     Patient Active Problem List   Diagnosis Date Noted  . New onset type 2 diabetes mellitus ([place]) 04/01/2019  . FUO (fever of unknown origin) 04/01/2019  . History of kidney stones 10/14/2018  . Insomnia 10/14/2018  . Essential hypertension 01/14/2008  . HLD (hyperlipidemia) 11/11/2006  . Osteopenia 11/11/2006    Past Surgical History:  Procedure Laterality Date  . BREAST BIOPSY Right 08/19/2012   neg, Dr. Bary Castilla  . extraction of renal stones    . TONSILLECTOMY AND ADENOIDECTOMY    . VAGINAL  HYSTERECTOMY  1980   class IV, ovaries intact    Prior to Admission medications   Medication Sig Start Date End Date Taking? Authorizing Provider  atorvastatin (LIPITOR) 10 MG tablet TAKE 1 TABLET (10 MG TOTAL) BY MOUTH DAILY. MUST SCHEDULE ANNUAL PHYSICAL 03/02/19   Jearld Fenton, NP  blood glucose meter kit and supplies KIT Lif[employer]can meter Check blood sugar 2 times daily as needed 04/05/19   Jearld Fenton, NP  calcium carbonate (CALCIUM 600) 600 MG TABS tablet Take 1 tablet (600 mg total) by mouth 2 (two) times daily with a meal. 10/13/18   Jearld Fenton, NP  Cholecalciferol (VITAMIN D) 50 MCG (2000 UT) CAPS Take by mouth. 10/13/18   Jearld Fenton, NP  Lancets (ONETOUCH DELICA PLUS [id number]) MISC USE AS DIRECTED TWICE A DAY AS NEEDED 04/20/19   [provider]  lisinopril (ZESTRIL) 10 MG tablet Take 1 tablet (10 mg total) by mouth daily. 04/16/19   Jearld Fenton, NP  Multiple Vitamin (MULTIVITAMIN) tablet Take 1 tablet by mouth daily.      [provider]  [facility] ULTRA test strip USE AS DIRECTED TWICE A DAY AS NEEDED 04/16/19   [provider]  OVER THE COUNTER MEDICATION Take 100 mg by mouth daily. Potassium    [provider]  OVER THE COUNTER MEDICATION Take 400 mg by mouth daily. Magnesium    [provider]  tiZANidine (ZANAFLEX) 2 MG tablet Take 1 tablet (2 mg total)  by mouth every 8 (eight) hours as needed for muscle spasms. 06/01/19   Delsa Grana, PA-C  traZODone (DESYREL) 50 MG tablet TAKE 0.5-1 TABLETS (25-50 MG TOTAL) BY MOUTH AT BEDTIME. 04/08/19   Baity, Coralie Keens, NP    Allergies Patient has no known allergies.  Family History  Problem Relation Age of Onset  . Hypertension Mother   . Dementia Mother   . Stroke Mother   . Cancer - Other Father        Bile Duct  . Heart disease Maternal Grandmother   . Heart disease Maternal Grandfather   . Hypertension Paternal Grandmother   . Cancer Maternal Aunt        breast and lung   . Stroke Paternal Grandfather     Social History Social History   Tobacco Use  . Smoking status: Former Smoker    Packs/day: 0.25    Years: 30.00    Pack years: 7.50    Types: Cigarettes    Start date: 07/09/1983    Quit date: 05/27/2014    Years since quitting: 5.0  . Smokeless tobacco: Never Used  Substance Use Topics  . Alcohol use: Yes    Alcohol/week: 1.0 standard drinks    Types: 1 Glasses of wine per week    Comment: occasional  . Drug use: No    Review of Systems  Constitutional: No fever/chills Eyes: No visual changes. ENT: No sore throat. Cardiovascular: Positive for palpitations.  Denies chest pain. Respiratory: Denies shortness of breath. Gastrointestinal: No abdominal pain.  No nausea, no vomiting.  No diarrhea.  No constipation. Genitourinary: Negative for dysuria. Musculoskeletal: Negative for back pain. Skin: Negative for rash. Neurological: Positive for dizziness.  Negative for headaches, focal weakness or numbness.   ____________________________________________   PHYSICAL EXAM:  VITAL SIGNS: ED Triage Vitals  Enc Vitals Group     BP 06/14/19 2029 (!) 148/78     Pulse Rate 06/14/19 2029 92     Resp 06/14/19 2029 18     Temp 06/14/19 2029 98.1 F (36.7 C)     Temp Source 06/14/19 2029 Oral     SpO2 06/14/19 2029 98 %     Weight 06/14/19 2032 185 lb (83.9 kg)     Height 06/14/19 2032 _0  (1.626 m)     Head Circumference --      Peak Flow --      Pain Score 06/14/19 2037 0     Pain Loc --      Pain Edu? --      Excl. in [place]? --     Constitutional: Alert and oriented. Well appearing and in no acute distress. Eyes: Conjunctivae are normal. PERRL. EOMI. Head: Atraumatic. Nose: No congestion/rhinnorhea. Mouth/Throat: Mucous membranes are moist.  Oropharynx non-erythematous. Neck: No stridor.   Cardiovascular: Normal rate, regular rhythm. Grossly normal heart sounds.  Good peripheral circulation. Respiratory: Normal respiratory effort.   No retractions. Lungs CTAB. Gastrointestinal: Soft and nontender. No distention. No abdominal bruits. No CVA tenderness. Musculoskeletal: No lower extremity tenderness nor edema.  No joint effusions. Neurologic:  Normal speech and language. No gross focal neurologic deficits are appreciated. No gait instability. Skin:  Skin is warm, dry and intact. No rash noted. Psychiatric: Mood and affect are normal. Speech and behavior are normal.  ____________________________________________   LABS (all labs ordered are listed, but only abnormal results are displayed)  Labs Reviewed  BASIC METABOLIC PANEL - Abnormal; Notable for the following components:  Result Value   Glucose, Bld 136 (*)    Creatinine, Ser 1.38 (*)    GFR calc non Af Amer 39 (*)    GFR calc Af Amer 46 (*)    All other components within normal limits  URINALYSIS, COMPLETE (UACMP) WITH MICROSCOPIC - Abnormal; Notable for the following components:   Color, Urine YELLOW (*)    APPearance CLEAR (*)    All other components within normal limits  CBC  TROPONIN I (HIGH SENSITIVITY)  TROPONIN I (HIGH SENSITIVITY)   ____________________________________________  EKG  ED ECG REPORT I, , J, the attending physician, personally viewed and interpreted this ECG.   Date: 06/14/2019  EKG Time: 2036  Rate: 93  Rhythm: normal EKG, normal sinus rhythm  Axis: Normal  Intervals:none  ST&T Change: Nonspecific  ____________________________________________  RADIOLOGY  ED MD interpretation: No acute cardiopulmonary process  Official radiology report(s): Dg Chest 2 View  Result Date: 06/14/2019 CLINICAL DATA:  Hypertension and shortness of breath EXAM: CHEST - 2 VIEW COMPARISON:  04/05/2019 FINDINGS: The heart size and mediastinal contours are within normal limits. Both lungs are clear. The visualized skeletal structures are unremarkable. IMPRESSION: No active cardiopulmonary disease. Electronically Signed   By: Ulyses Jarred M.D.   On: 06/14/2019 21:01    ____________________________________________   PROCEDURES  Procedure(s) performed (including Critical Care):  Procedures   ____________________________________________   INITIAL IMPRESSION / ASSESSMENT AND PLAN / ED COURSE  As part of my medical decision making, I reviewed the following data within the [facility] notes reviewed and incorporated, Labs reviewed, EKG interpreted, Old chart reviewed, Radiograph reviewed and Notes from prior ED visits     Athina Fahey [facility] was evaluated in Emergency Department on 06/15/2019 for the symptoms described in the history of present illness. She was evaluated in the context of the global COVID-19 pandemic, which necessitated consideration that the patient might be at risk for infection with the SARS-CoV-2 virus that causes COVID-19. Institutional protocols and algorithms that pertain to the evaluation of patients at risk for COVID-19 are in a state of rapid change based on information released by regulatory bodies including the CDC and federal and state organizations. These policies and algorithms were followed during the patient's care in the ED.    68 year old female with hypertension who presents with elevated blood pressure.  Differential diagnosis includes but is not limited to ACS, infectious, metabolic etiologies, etc.  Initial troponin and EKG unremarkable.  Will check repeat troponin, urinalysis.  Blood pressure currently 126/83 without intervention.  Will refer to cardiology for outpatient follow-up of palpitations.  Clinical Course as of Jun 15 23  Tue Jun 15, 2019  0023 Updated patient and spouse on repeat troponin and urinalysis results.  Will refer her to cardiology for outpatient follow-up for Holter monitoring.  Have encouraged patient to keep a blood pressure log and take it to her doctor.  Strict return precautions given.  Both verbalized understanding and agree  with plan of care.   [JS]    Clinical Course User Index [JS] Paulette Blanch, MD     ____________________________________________   FINAL CLINICAL IMPRESSION(S) / ED DIAGNOSES  Final diagnoses:  Essential hypertension  Palpitations     ED Discharge Orders    None       Note:  This document was prepared using Dragon voice recognition software and may include unintentional dictation errors.   Paulette Blanch, MD 06/15/19 [phone]

## 2019-06-14 NOTE — ED Notes (Signed)
Pt's husband updated.

## 2019-06-14 NOTE — ED Triage Notes (Addendum)
Pt arrives to ED via POV from home with c/o HTN (highest was 185/110) and "heart fluttering" that happened at 630pm tonight. Pt reports associated dizziness and lightheadedness, but denies SHOB. Pt reports h/x of HTN and takes medications as prescribed. Pt denies any c/o pain. Pt denies any c/o N/V/D, fever or cough. No recent sick contacts. Pt is A&O, in NAD; RR even, regular and unlabored.

## 2019-06-15 ENCOUNTER — Telehealth: Payer: Self-pay | Admitting: Family Medicine

## 2019-06-15 ENCOUNTER — Ambulatory Visit (INDEPENDENT_AMBULATORY_CARE_PROVIDER_SITE_OTHER): Payer: Medicare HMO | Admitting: Cardiovascular Disease

## 2019-06-15 ENCOUNTER — Encounter: Payer: Self-pay | Admitting: Cardiovascular Disease

## 2019-06-15 ENCOUNTER — Ambulatory Visit (INDEPENDENT_AMBULATORY_CARE_PROVIDER_SITE_OTHER): Payer: Medicare HMO

## 2019-06-15 VITALS — BP 138/80 | HR 76 | Temp 97.7°F | Ht 64.0 in | Wt 188.5 lb

## 2019-06-15 DIAGNOSIS — I10 Essential (primary) hypertension: Secondary | ICD-10-CM

## 2019-06-15 DIAGNOSIS — I479 Paroxysmal tachycardia, unspecified: Secondary | ICD-10-CM | POA: Diagnosis not present

## 2019-06-15 DIAGNOSIS — E782 Mixed hyperlipidemia: Secondary | ICD-10-CM | POA: Diagnosis not present

## 2019-06-15 LAB — TROPONIN I (HIGH SENSITIVITY): Troponin I (High Sensitivity): 8 ng/L (ref <18)

## 2019-06-15 MED ORDER — METOPROLOL TARTRATE 25 MG PO TABS: 25.0000 mg | ORAL_TABLET | Freq: Two times a day (BID) | ORAL | 3 refills | Status: DC | PRN

## 2019-06-15 NOTE — Discharge Instructions (Addendum)
1.  Keep a log of your blood pressures morning, afternoon and evening.  Take this to your doctor. 2.  Return to the ER for worsening symptoms, persistent vomiting, difficulty breathing or other concerns.

## 2019-06-15 NOTE — Progress Notes (Signed)
Cardiology Office Note  Date:  06/15/2019   ID:  Jacqueline [name] [facility], DOB June 26, 1951, MRN [id number]  PCP:  Delsa Grana, PA-C   Chief Complaint  Patient presents with  . other    Follow up from [facility] ER; HTN. Meds reviewed by the pt. verbally. Pt. c/o rapid irreg. heart beats, pounding in chest and shortness of breath [place] medical mall to our lobby.     HPI:  Jacqueline [name] is a 68 year old woman with past medical history of Hypertension Hyperlipidemia Borderline diabetes Seen in the emergency room yesterday for hypertension She presents today for paroxysmal tachycardia, hypertension  Seen in the emergency room yesterday Reports that typically blood pressure well controlled on lisinopril 10 up to 20 daily Yesterday was at home when she developed acute paroxysmal tachycardia Felt very strong, palpitating feeling in her chest Felt awful, scared her, Checked her blood pressure which was running high 185/110 Tried to lay down but cardiac tachycardia persisted Eventually called EMTs daily suggested she go to the emergency room Tachycardia eased off on route and EKG in the hospital was normal Lab work reviewed, acute Kleiman creatinine 1.38 She feels her cardiac arrhythmia lasted 1 hour yesterday  Arrhythmia again today, Palpitations 10 min earlier this morning Another 10 minutes walking into the office today Resolved without intervention, very scary  Denies significant stressors, no diarrhea bronchitis or other illness As any fevers  Fevers over the summer, details discussed with her, seen by ID Felt to have CMV, symptoms resolved without intervention  Has periodic leg cramps Stopped lipitor Cramps have persisted  EKG personally reviewed by myself on todays visit Normal sinus rhythm rate 76 bpm no significant ST-T wave changes    PMH:   has a past medical history of Calcium oxalate renal stones, Hyperlipidemia, Hypertension, and MVP (mitral valve  prolapse).  PSH:    Past Surgical History:  Procedure Laterality Date  . BREAST BIOPSY Right 08/19/2012   neg, Dr. Bary Castilla  . extraction of renal stones    . TONSILLECTOMY AND ADENOIDECTOMY    . VAGINAL HYSTERECTOMY  1980   class IV, ovaries intact    Current Outpatient Medications  Medication Sig Dispense Refill  . atorvastatin (LIPITOR) 10 MG tablet TAKE 1 TABLET (10 MG TOTAL) BY MOUTH DAILY. MUST SCHEDULE ANNUAL PHYSICAL 90 tablet 0  . blood glucose meter kit and supplies KIT Lifescan meter Check blood sugar 2 times daily as needed 1 each 0  . calcium carbonate (CALCIUM 600) 600 MG TABS tablet Take 1 tablet (600 mg total) by mouth 2 (two) times daily with a meal. 30 tablet 0  . Cholecalciferol (VITAMIN D) 50 MCG (2000 UT) CAPS Take by mouth. 30 capsule   . Lancets (ONETOUCH DELICA PLUS [id number]) MISC USE AS DIRECTED TWICE A DAY AS NEEDED    . lisinopril (ZESTRIL) 10 MG tablet Take 1 tablet (10 mg total) by mouth daily. 30 tablet 2  . Multiple Vitamin (MULTIVITAMIN) tablet Take 1 tablet by mouth daily.      Glory Rosebush ULTRA test strip USE AS DIRECTED TWICE A DAY AS NEEDED    . OVER THE COUNTER MEDICATION Take 100 mg by mouth daily. Potassium    . OVER THE COUNTER MEDICATION Take 400 mg by mouth daily. Magnesium    . tiZANidine (ZANAFLEX) 2 MG tablet Take 1 tablet (2 mg total) by mouth every 8 (eight) hours as needed for muscle spasms. 30 tablet 0  . traZODone (DESYREL) 50 MG  tablet TAKE 0.5-1 TABLETS (25-50 MG TOTAL) BY MOUTH AT BEDTIME. 90 tablet 0  . metoprolol tartrate (LOPRESSOR) 25 MG tablet Take 1 tablet (25 mg total) by mouth 2 (two) times daily as needed. 60 tablet 3   No current facility-administered medications for this visit.      Allergies:   Patient has no known allergies.   Social History:  The patient  reports that she quit smoking about 5 years ago. Her smoking use included cigarettes. She started smoking about 35 years ago. She has a 7.50 pack-year smoking  history. She has never used smokeless tobacco. She reports current alcohol use of about 1.0 standard drinks of alcohol per week. She reports that she does not use drugs.   Family History:   family history includes Cancer in her maternal aunt; Cancer - Other in her father; Dementia in her mother; Heart disease in her maternal grandfather and maternal grandmother; Hypertension in her mother and paternal grandmother; Stroke in her mother and paternal grandfather.    Review of Systems: Review of Systems  Constitutional: Negative.   HENT: Negative.   Respiratory: Negative.   Cardiovascular: Negative.   Gastrointestinal: Negative.   Musculoskeletal: Negative.   Neurological: Negative.   Psychiatric/Behavioral: Negative.   All other systems reviewed and are negative.   PHYSICAL EXAM: VS:  BP 138/80 (BP Location: Right Arm, Patient Position: Sitting, Cuff Size: Normal)   Pulse 76   Temp 97.7 F (36.5 C)   Ht 5' 4"  (1.626 m)   Wt 188 lb 8 oz (85.5 kg)   SpO2 98%   BMI 32.36 kg/m  , BMI Body mass index is 32.36 kg/m. GEN: Well nourished, well developed, in no acute distress HEENT: normal Neck: no JVD, carotid bruits, or masses Cardiac: RRR; no murmurs, rubs, or gallops,no edema  Respiratory:  clear to auscultation bilaterally, normal work of breathing GI: soft, nontender, nondistended, + BS MS: no deformity or atrophy Skin: warm and dry, no rash Neuro:  Strength and sensation are intact Psych: euthymic mood, full affect   Recent Labs: 04/05/2019: TSH 1.193 06/01/2019: ALT 30; Magnesium 2.0 06/14/2019: BUN 18; Creatinine, Ser 1.38; Hemoglobin 14.0; Platelets 249; Potassium 3.8; Sodium 138    Lipid Panel Lab Results  Component Value Date   CHOL 136 10/15/2018   HDL 46.50 10/15/2018   LDLCALC 78 10/15/2018   TRIG 59.0 10/15/2018      Wt Readings from Last 3 Encounters:  06/15/19 188 lb 8 oz (85.5 kg)  06/14/19 185 lb (83.9 kg)  05/28/19 189 lb 1.6 oz (85.8 kg)      ASSESSMENT AND PLAN:  Problem List Items Addressed This Visit      Cardiology Problems   HLD (hyperlipidemia)   Relevant Medications   metoprolol tartrate (LOPRESSOR) 25 MG tablet   Essential hypertension   Relevant Medications   metoprolol tartrate (LOPRESSOR) 25 MG tablet   Other Relevant Orders   EKG 12-Lead    Other Visit Diagnoses    Paroxysmal tachycardia (HCC)    -  Primary   Relevant Medications   metoprolol tartrate (LOPRESSOR) 25 MG tablet   Other Relevant Orders   EKG 12-Lead   LONG TERM MONITOR (3-14 DAYS)   Palpitations         Etiology of her tachycardia is unclear Very symptomatic yesterday and today Discussed various types of cardiac arrhythmia with her, ZIO monitor has been ordered We have given her metoprolol tartrate 25 mg twice daily as needed to take for  arrhythmia When she has had several spells on the monitor suggested she send in for processing Otherwise benign EKG, clinical exam No symptoms to suggest angina Few cardiac risk factors Denies any recent stressors that could have contributed to her symptoms -If improvement of her symptoms with metoprolol tartrate could change to metoprolol succinate at a later date If symptoms resolve she concerning take metoprolol as needed   Hypertension Elevated blood pressure in the hospital, likely situational in the setting of paroxysmal tachycardia At home typically running 120 up to 130,  Suggested she take lisinopril 10 when she is taking metoprolol  Disposition:   F/U  1 month   Total encounter time more than 45 minutes  Greater than 50% was spent in counseling and coordination of care with the patient    Signed, Esmond Plants, M.D., Ph.D. [place], [place]

## 2019-06-15 NOTE — Patient Instructions (Addendum)
Zio monitor for paroxysmal tachycardia  Medication Instructions:  Metoprolol tatrarte 25 mg twice a day as needed  If you need a refill on your cardiac medications before your next appointment, please call your pharmacy.    Lab work: No new labs needed   If you have labs (blood work) drawn today and your tests are completely normal, you will receive your results only by: Marland Kitchen MyChart Message (if you have MyChart) OR . A paper copy in the mail If you have any lab test that is abnormal or we need to change your treatment, we will call you to review the results.   Testing/Procedures: Your physician has recommended that you wear a Zio monitor. This monitor is a medical device that records the heart's electrical activity. Doctors most often use these monitors to diagnose arrhythmias. Arrhythmias are problems with the speed or rhythm of the heartbeat. The monitor is a small device applied to your chest. You can wear one while you do your normal daily activities. While wearing this monitor if you have any symptoms to push the button and record what you felt. Once you have worn this monitor for the period of time provider prescribed (Usually 14 days), you will return the monitor device in the postage paid box. Once it is returned they will download the data collected and provide [place] with a report which the provider will then review and we will call you with those results. Important tips:  1. Avoid showering during the first 24 hours of wearing the monitor. 2. Avoid excessive sweating to help maximize wear time. 3. Do not submerge the device, no hot tubs, and no swimming pools. 4. Keep any lotions or oils away from the patch. 5. After 24 hours you may shower with the patch on. Take brief showers with your back facing the shower head.  6. Do not remove patch once it has been placed because that will interrupt data and decrease adhesive wear time. 7. Push the button when you have any symptoms and write  down what you were feeling. 8. Once you have completed wearing your monitor, remove and place into box which has postage paid and place in your outgoing mailbox.  9. If for some reason you have misplaced your box then call our office and we can provide another box and/or mail it off for you.         Follow-Up: At [facility], you and your health needs are our priority.  As part of our continuing mission to provide you with exceptional heart care, we have created designated Provider Care Teams.  These Care Teams include your primary Cardiologist (physician) and Advanced Practice Providers (APPs -  Physician Assistants and Nurse Practitioners) who all work together to provide you with the care you need, when you need it.  . You will need a follow up appointment in 1 month  . Providers on your designated Care Team:   . Murray Hodgkins, NP . Christell Faith, PA-C . Marrianne Mood, PA-C  Any Other Special Instructions Will Be Listed Below (If Applicable).  For educational health videos Log in to : [email or web address] Or : [email or web address], password : [email or web address]

## 2019-06-15 NOTE — Telephone Encounter (Signed)
Patient requesting call back from [place] or PCP to discuss recent (last night) ED visit. She would like to discuss her current medication list.

## 2019-06-25 ENCOUNTER — Ambulatory Visit (INDEPENDENT_AMBULATORY_CARE_PROVIDER_SITE_OTHER): Payer: Medicare HMO | Admitting: Family Medicine

## 2019-06-25 ENCOUNTER — Encounter: Payer: Self-pay | Admitting: Family Medicine

## 2019-06-25 ENCOUNTER — Other Ambulatory Visit: Payer: Self-pay

## 2019-06-25 VITALS — BP 116/68 | HR 85 | Temp 97.8°F | Resp 12 | Ht 64.0 in | Wt 187.0 lb

## 2019-06-25 DIAGNOSIS — N179 Acute kidney failure, unspecified: Secondary | ICD-10-CM

## 2019-06-25 DIAGNOSIS — Z23 Encounter for immunization: Secondary | ICD-10-CM | POA: Diagnosis not present

## 2019-06-25 DIAGNOSIS — I10 Essential (primary) hypertension: Secondary | ICD-10-CM

## 2019-06-25 DIAGNOSIS — M6281 Muscle weakness (generalized): Secondary | ICD-10-CM

## 2019-06-25 DIAGNOSIS — M62838 Other muscle spasm: Secondary | ICD-10-CM

## 2019-06-25 DIAGNOSIS — M791 Myalgia, unspecified site: Secondary | ICD-10-CM | POA: Insufficient documentation

## 2019-06-25 DIAGNOSIS — Z5181 Encounter for therapeutic drug level monitoring: Secondary | ICD-10-CM | POA: Diagnosis not present

## 2019-06-25 DIAGNOSIS — R768 Other specified abnormal immunological findings in serum: Secondary | ICD-10-CM | POA: Diagnosis not present

## 2019-06-25 DIAGNOSIS — E785 Hyperlipidemia, unspecified: Secondary | ICD-10-CM | POA: Diagnosis not present

## 2019-06-25 DIAGNOSIS — E119 Type 2 diabetes mellitus without complications: Secondary | ICD-10-CM | POA: Diagnosis not present

## 2019-06-25 MED ORDER — TIZANIDINE HCL 2 MG PO TABS: 2.0000 mg | ORAL_TABLET | Freq: Three times a day (TID) | ORAL | 0 refills | Status: DC | PRN

## 2019-06-25 NOTE — Progress Notes (Signed)
Name: [name]   MRN: [id number]    DOB: May 17, 1951   Date:06/25/2019       Progress Note  Chief Complaint  Patient presents with  . Leg Pain    and spasms     Subjective:   Jacqueline [name] is a 68 y.o. female, presents to clinic for routine follow up CC above and also need f/up on DM, HLD, HTN and various abnormal labs of the past 4 months with her past PCP, with infectious disease and while transitioning to our care here.  Pt presents for f/up with new DM, leg cramps, CMV post infectious changes, she also had elevated BP and palpitations, she just saw cardiology and is currently wearing a holter monitor and about 10 days ago she consulted with Dr. Rockey Situ.    She is now on metoprolol 25 mg BID and it has helped her palpitations a lot, she did follow his instructions -for several days she held the metoprolol medication so that the Holter monitor might be able to capture abnormal rhythm but she could only tolerate that for about 2 to 3 days her palpitations are too frequent and bothersome so she is on her metoprolol and her palpitations are well controlled with current dose.  She denies any exertional symptoms, fatigue, shortness of breath, near syncope. She was having dizzy episodes and did have some hypotension so few months ago her lisinopril was decreased from 20 mg to 10 mg and she is continue to take this dose her blood pressure today is 116/68 she denies lightheadedness, dizziness and feels good on the current medicines.  Diabetes Mellitus Type II: Currently managing with diet Fasting CBGs typically run 100-169, morning CBG around 100, postprandial 160's No hypoglycemic episodes Denies: Polyuria, polydipsia, polyphagia, vision changes, or neuropathy -  Recent pertinent labs: Lab Results  Component Value Date   HGBA1C 6.6 (H) 03/26/2019      Component Value Date/Time   NA 138 06/14/2019 2042   NA 139 06/01/2019 0837   K 3.8 06/14/2019 2042   CL 106 06/14/2019  2042   CO2 23 06/14/2019 2042   GLUCOSE 136 (H) 06/14/2019 2042   BUN 18 06/14/2019 2042   BUN 13 06/01/2019 0837   CREATININE 1.38 (H) 06/14/2019 2042   CALCIUM 9.1 06/14/2019 2042   PROT 7.0 06/01/2019 0837   ALBUMIN 4.1 06/01/2019 0837   AST 20 06/01/2019 0837   ALT 30 06/01/2019 0837   ALKPHOS 68 06/01/2019 0837   BILITOT 0.6 06/01/2019 0837   GFRNONAA 39 (L) 06/14/2019 2042   GFRAA 46 (L) 06/14/2019 2042   Current diet: in general, a "healthy" diet   Current exercise: none  UTD on DM foot exam and eye exam ACEI/ARB: Yes Statin: -She was temporarily holding her statin because of leg pain and weakness but was instructed to resume it.  Patient was to hold her statin medication for few days while we checked CK I also followed up with her past PCP.  Her CK was normal after her last visit a few months ago and her PCP stated that they had tried holding statin before and it made no difference to her symptoms.  She was encouraged to restart Lipitor but she did not restart it yet.  She has had no improvement in her leg pain and weakness with holding the Lipitor for now a few months.  She does complain of bilateral leg pain weakness does seem to have pain in her calves.  That he  feels generally weak and tired especially after use become more achy sore and fatigued feeling.  She denies any paresthesias numbness, color change, hair or skin changes.  She has no swelling, pallor, rash.  She denies orthopnea, PND and again palpitations are now well controlled she did not have any weight gain or exertional dyspnea. One of the abnormal labs ordered by infectious disease can be a marker for connective tissue disorders, patient has not yet followed up with ID, ID said it was up to her PCP, her PCP said it was a lab from ID so she is having difficulty getting follow-up regarding some of her abnormal labs from before she establish care with me. Patient does not have any history of atherosclerosis and  unclear if her muscle pain is truly claudication she states that it does not feel like restless leg its not waking her up at night but muscle x-rays are helping her sleep.  On exam she does not have any weakness or decreased sensation.  Pulses are somewhat difficult to palpate in her lower extremities but she has normal capillary refill has no pallor or cyanosis.  We will try and get ABIs ordered since I am unable to do in clinic Patient states that she does have some past and current low back pain.  She does endorse left LBP with some sciatica worse when getting up in the morning or if she has any periods of immobility.  She does not have any reproducible pain to her spine SI joints or paraspinal muscles and does not have any radicular symptoms on exam today with a negative straight leg raise and good range of motion  Reviewed her past ER visits and she has not come here to follow-up on the ER visit she did go to cardiology for follow-up.  She did have AKI with increased creatinine and decreased GFR.  Cardiology did not repeat labs we will do that here today.  She states that she does not understand how she could possibly be dehydrated because she is drinking constantly all day long and is urinating like she would expect to, frequently, urine is pale to clear.        Patient Active Problem List   Diagnosis Date Noted  . New onset type 2 diabetes mellitus ([place]) 04/01/2019  . FUO (fever of unknown origin) 04/01/2019  . History of kidney stones 10/14/2018  . Insomnia 10/14/2018  . Essential hypertension 01/14/2008  . HLD (hyperlipidemia) 11/11/2006  . Osteopenia 11/11/2006    Past Surgical History:  Procedure Laterality Date  . BREAST BIOPSY Right 08/19/2012   neg, Dr. Bary Castilla  . extraction of renal stones    . TONSILLECTOMY AND ADENOIDECTOMY    . VAGINAL HYSTERECTOMY  1980   class IV, ovaries intact    Family History  Problem Relation Age of Onset  . Hypertension Mother   . Dementia  Mother   . Stroke Mother   . Cancer - Other Father        Bile Duct  . Heart disease Maternal Grandmother   . Heart disease Maternal Grandfather   . Hypertension Paternal Grandmother   . Cancer Maternal Aunt        breast and lung  . Stroke Paternal Grandfather     Social History   Socioeconomic History  . Marital status: Married    Spouse name: Richardson Landry  . Number of children: 2  . Years of education: Not on file  . Highest education level: Some college, no  degree  Occupational History  . Occupation: Naval architect  Tobacco Use  . Smoking status: Former Smoker    Packs/day: 0.25    Years: 30.00    Pack years: 7.50    Types: Cigarettes    Start date: 07/09/1983    Quit date: 05/27/2014    Years since quitting: 5.0  . Smokeless tobacco: Never Used  Substance and Sexual Activity  . Alcohol use: Yes    Alcohol/week: 1.0 standard drinks    Types: 1 Glasses of wine per week    Comment: occasional  . Drug use: No  . Sexual activity: Yes    Partners: Male  Other Topics Concern  . Not on file  Social History Narrative  . Not on file   Social Determinants of Health   Financial Resource Strain: Low Risk   . Difficulty of Paying Living Expenses: Not hard at all  Food Insecurity: No Food Insecurity  . Worried About Charity fundraiser in the Last Year: Never true  . Ran Out of Food in the Last Year: Never true  Transportation Needs: No Transportation Needs  . Lack of Transportation (Medical): No  . Lack of Transportation (Non-Medical): No  Physical Activity: Sufficiently Active  . Days of Exercise per Week: 7 days  . Minutes of Exercise per Session: 60 min  Stress: No Stress Concern Present  . Feeling of Stress : Not at all  Social Connections: Not Isolated  . Frequency of Communication with Friends and Family: More than three times a week  . Frequency of Social Gatherings with Friends and Family: More than three times a week  . Attends Religious Services: More than  4 times per year  . Active Member of Clubs or Organizations: Yes  . Attends Archivist Meetings: More than 4 times per year  . Marital Status: Married  Human resources officer Violence: Not At Risk  . Fear of Current or Ex-Partner: No  . Emotionally Abused: No  . Physically Abused: No  . Sexually Abused: No     Current Outpatient Medications:  .  calcium carbonate (CALCIUM 600) 600 MG TABS tablet, Take 1 tablet (600 mg total) by mouth 2 (two) times daily with a meal., Disp: 30 tablet, Rfl: 0 .  Cholecalciferol (VITAMIN D) 50 MCG (2000 UT) CAPS, Take by mouth., Disp: 30 capsule, Rfl:  .  lisinopril (ZESTRIL) 10 MG tablet, Take 1 tablet (10 mg total) by mouth daily., Disp: 30 tablet, Rfl: 2 .  metoprolol tartrate (LOPRESSOR) 25 MG tablet, Take 1 tablet (25 mg total) by mouth 2 (two) times daily as needed., Disp: 60 tablet, Rfl: 3 .  Multiple Vitamin (MULTIVITAMIN) tablet, Take 1 tablet by mouth daily.  , Disp: , Rfl:  .  OVER THE COUNTER MEDICATION, Take 100 mg by mouth daily. Potassium, Disp: , Rfl:  .  OVER THE COUNTER MEDICATION, Take 400 mg by mouth daily. Magnesium, Disp: , Rfl:  .  tiZANidine (ZANAFLEX) 2 MG tablet, Take 1 tablet (2 mg total) by mouth every 8 (eight) hours as needed for muscle spasms., Disp: 30 tablet, Rfl: 0 .  traZODone (DESYREL) 50 MG tablet, TAKE 0.5-1 TABLETS (25-50 MG TOTAL) BY MOUTH AT BEDTIME., Disp: 90 tablet, Rfl: 0 .  atorvastatin (LIPITOR) 10 MG tablet, TAKE 1 TABLET (10 MG TOTAL) BY MOUTH DAILY. MUST SCHEDULE ANNUAL PHYSICAL (Patient not taking: Reported on 06/25/2019), Disp: 90 tablet, Rfl: 0 .  blood glucose meter kit and supplies KIT, Lifescan meter Check blood  sugar 2 times daily as needed, Disp: 1 each, Rfl: 0 .  Lancets (ONETOUCH DELICA PLUS [id number]) MISC, USE AS DIRECTED TWICE A DAY AS NEEDED, Disp: , Rfl:  .  ONETOUCH ULTRA test strip, USE AS DIRECTED TWICE A DAY AS NEEDED, Disp: , Rfl:   No Known Allergies  I personally reviewed active  problem list, medication list, allergies, family history, social history, health maintenance, notes from last several encounters, lab results, imaging with the patient/caregiver today.  Review of Systems  Constitutional: Negative.  Negative for activity change, appetite change, chills, diaphoresis, fatigue, fever and unexpected weight change.  HENT: Negative.   Eyes: Negative.   Respiratory: Negative.  Negative for cough, choking, chest tightness, shortness of breath and wheezing.   Cardiovascular: Negative.  Negative for chest pain, palpitations and leg swelling.  Gastrointestinal: Negative.  Negative for abdominal pain, blood in stool, constipation, diarrhea, nausea and vomiting.  Endocrine: Positive for cold intolerance. Negative for heat intolerance, polydipsia, polyphagia and polyuria.  Genitourinary: Negative.   Musculoskeletal: Positive for back pain and myalgias. Negative for arthralgias, gait problem, joint swelling, neck pain and neck stiffness.  Skin: Negative.  Negative for color change and pallor.  Allergic/Immunologic: Negative.   Neurological: Negative.  Negative for dizziness, weakness, light-headedness, numbness and headaches.  Hematological: Negative.   Psychiatric/Behavioral: Negative.   All other systems reviewed and are negative.    Objective:    Vitals:   06/25/19 1121  BP: 116/68  Pulse: 85  Resp: 12  Temp: 97.8 F (36.6 C)  SpO2: 97%  Weight: 187 lb (84.8 kg)  Height: 5' 4" (1.626 m)    Body mass index is 32.1 kg/m.  Physical Exam Vitals and nursing note reviewed.  Constitutional:      General: She is not in acute distress.    Appearance: Normal appearance. She is well-developed. She is obese. She is not ill-appearing, toxic-appearing or diaphoretic.  HENT:     Head: Normocephalic and atraumatic.     Right Ear: External ear normal.     Left Ear: External ear normal.     Nose: Nose normal.     Mouth/Throat:     Pharynx: Uvula midline.  Eyes:      General: Lids are normal.     Conjunctiva/sclera: Conjunctivae normal.     Pupils: Pupils are equal, round, and reactive to light.  Neck:     Trachea: Phonation normal. No tracheal deviation.  Cardiovascular:     Rate and Rhythm: Normal rate and regular rhythm.  No extrasystoles are present.    Chest Wall: PMI is not displaced.     Pulses:          Radial pulses are 2+ on the right side and 2+ on the left side.       Dorsalis pedis pulses are 1+ on the right side and 1+ on the left side.       Posterior tibial pulses are 1+ on the right side and 1+ on the left side.     Heart sounds: Normal heart sounds. Heart sounds not distant. No murmur. No friction rub. No gallop.   Pulmonary:     Effort: Pulmonary effort is normal. No respiratory distress.     Breath sounds: Normal breath sounds. No stridor. No wheezing, rhonchi or rales.  Chest:     Chest wall: No tenderness.  Abdominal:     General: Bowel sounds are normal. There is no distension.     Palpations: Abdomen is soft.  Tenderness: There is no abdominal tenderness. There is no guarding or rebound.  Musculoskeletal:        General: No deformity. Normal range of motion.     Cervical back: Normal range of motion and neck supple.     Right lower leg: No edema.     Left lower leg: No edema.     Comments: No midline tenderness from cervical to lumbar spine, no step off No paraspinal muscle ttp from cervical to lumbar spine  Grossly normal ROM of back Negative SLR b/l 5/5 strength bilaterally with dorsiflexion, plantarflexion, flexion and extension at knees, and flexion and extension at hips Grossly normal sensation to light touch to bilateral lower extremities Normal gait  Lymphadenopathy:     Cervical: No cervical adenopathy.  Skin:    General: Skin is warm and dry.     Capillary Refill: Capillary refill takes less than 2 seconds.     Coloration: Skin is not ashen, cyanotic, jaundiced, mottled or pale.     Findings: No  bruising or rash.     Nails: There is no clubbing.  Neurological:     Mental Status: She is alert and oriented to person, place, and time.     Cranial Nerves: No dysarthria or facial asymmetry.     Sensory: Sensation is intact. No sensory deficit.     Motor: No weakness, tremor, atrophy or abnormal muscle tone.     Coordination: Coordination is intact. Coordination normal.     Gait: Gait is intact. Gait normal.     Comments: Diabetes foot exam done normal monofilament sensation testing to feet bilaterally Grossly normal sensation to light touch to bilateral lower extremities 5/5 strength bilaterally with dorsiflexion, plantarflexion, extension and flexion at the knees, flexion and extension at the hips, and bilateral grip strength  Psychiatric:        Speech: Speech normal.        Behavior: Behavior normal.      Recent Results (from the past 2160 hour(s))  Pathologist smear review     Status: None   Collection Time: 04/05/19  8:38 AM  Result Value Ref Range   Path Review Peripheral blood smear is reviewed.     Comment: Leukocytosis with absolute lymphocytosis including reactive lymphocytes and rare large granular lymphocytes. No increase in blasts. Normochromic, normocytic RBCs without anisopoikilocytosis. Normal platelet count and morphology. Reviewed by Kathi Simpers, M.D. Performed at [facility], [address] [place], [place] [address]   Urinalysis, Routine w reflex microscopic     Status: Abnormal   Collection Time: 04/05/19  9:08 AM  Result Value Ref Range   Color, Urine YELLOW (A) YELLOW   APPearance HAZY (A) CLEAR   Specific Gravity, Urine 1.023 1.005 - 1.030   pH 6.0 5.0 - 8.0   Glucose, UA NEGATIVE NEGATIVE mg/dL   Hgb urine dipstick NEGATIVE NEGATIVE   Bilirubin Urine NEGATIVE NEGATIVE   Ketones, ur NEGATIVE NEGATIVE mg/dL   Protein, ur NEGATIVE NEGATIVE mg/dL   Nitrite NEGATIVE NEGATIVE   Leukocytes,Ua NEGATIVE NEGATIVE    Comment: Performed at  [facility], [address] [place], [place] [address]  ANA Comprehensive Panel     Status: Abnormal   Collection Time: 04/05/19  9:24 AM  Result Value Ref Range   ds DNA Ab <1 0 - 9 IU/mL    Comment: (NOTE)  Negative      <5                                   Equivocal  5 - 9                                   Positive      >9    Ribonucleic Protein >8.0 (H) 0.0 - 0.9 AI   ENA SM Ab Ser-aCnc <0.2 0.0 - 0.9 AI   Scleroderma (Scl-70) (ENA) Antibody, IgG <0.2 0.0 - 0.9 AI   SSA (Ro) (ENA) Antibody, IgG <0.2 0.0 - 0.9 AI   SSB (La) (ENA) Antibody, IgG <0.2 0.0 - 0.9 AI   Chromatin Ab SerPl-aCnc <0.2 0.0 - 0.9 AI   Anti JO-1 <0.2 0.0 - 0.9 AI   Centromere Ab Screen <0.2 0.0 - 0.9 AI   See below: Comment     Comment: (NOTE) Autoantibody                       Disease Association ------------------------------------------------------------                        Condition                  Frequency ---------------------   ------------------------   --------- Antinuclear Antibody,    SLE, mixed connective Direct (ANA-D)           tissue diseases ---------------------   ------------------------   --------- dsDNA                    SLE                        40 - 60% ---------------------   ------------------------   --------- Chromatin                Drug induced SLE                90%                         SLE                        48 - 97% ---------------------   ------------------------   --------- SSA (Ro)                 SLE                        25 - 35%                         Sjogren's Syndrome         40 - 70%                         Neonatal Lupus                 100% ---------------------   ------------------------   --------- SSB (La)                 SLE  10%                         Sjogren's Syndrome              30% ---------------------   -----------------------    --------- Sm (anti-Smith)           SLE                        15 - 30% ---------------------   -----------------------    --------- RNP                      Mixed Connective Tissue                         Disease                         95% (U1 nRNP,                SLE                        30 - 50% anti-ribonucleoprotein)  Polymyositis and/or                         Dermatomyositis                 20% ---------------------   ------------------------   --------- Scl-70 (antiDNA          Scleroderma (diffuse)      20 - 35% topoisomerase)           Crest                           13% ---------------------   ------------------------   --------- Jo-1                     Polymyositis and/or                         Dermatomyositis            20 - 40% ---------------------   ------------------------   --------- Centromere B             Scleroderma -  Crest                         variant                         80% Performed At: [facility] [place], [place] [id number] Rush Farmer MD [id number]   Cmv antibody, IgG (EIA)     Status: Abnormal   Collection Time: 04/05/19  9:24 AM  Result Value Ref Range   CMV Ab - IgG 5.30 (H) 0.00 - 0.59 U/mL    Comment: (NOTE)                               Negative          <0.60  Equivocal   0.60 - 0.69                               Positive          >0.69 Performed At: [facility] [place], [place] [id number] Rush Farmer MD [id number]   CMV IgM     Status: Abnormal   Collection Time: 04/05/19  9:24 AM  Result Value Ref Range   CMV IgM >240.0 (H) 0.0 - 29.9 AU/mL    Comment: (NOTE)                                Negative         <30.0                                Equivocal  30.0 - 34.9                                Positive         >34.9 A positive result is generally indicative of acute infection, reactivation or persistent IgM production. Performed At: [facility] [place], [place] [id number] Rush Farmer MD [id number]   CBC with Differential/Platelet     Status: Abnormal   Collection Time: 04/05/19  9:24 AM  Result Value Ref Range   WBC 14.6 (H) 4.0 - 10.5 K/uL   RBC 4.73 3.87 - 5.11 MIL/uL   Hemoglobin 14.0 12.0 - 15.0 g/dL   HCT 42.7 36.0 - 46.0 %   MCV 90.3 80.0 - 100.0 fL   MCH 29.6 26.0 - 34.0 pg   MCHC 32.8 30.0 - 36.0 g/dL   RDW 13.5 11.5 - 15.5 %   Platelets 266 150 - 400 K/uL   nRBC 0.0 0.0 - 0.2 %   Neutrophils Relative % 32 %   Neutro Abs 4.7 1.7 - 7.7 K/uL   Lymphocytes Relative 55 %   Lymphs Abs 8.0 (H) 0.7 - 4.0 K/uL   Monocytes Relative 9 %   Monocytes Absolute 1.3 (H) 0.1 - 1.0 K/uL   Eosinophils Relative 2 %   Eosinophils Absolute 0.3 0.0 - 0.5 K/uL   Basophils Relative 1 %   Basophils Absolute 0.2 (H) 0.0 - 0.1 K/uL   RBC Morphology MORPHOLOGY UNREMARKABLE    Smear Review Normal platelet morphology    Immature Granulocytes 1 %   Abs Immature Granulocytes 0.13 (H) 0.00 - 0.07 K/uL   Reactive, Benign Lymphocytes PRESENT     Comment: Performed at [facility], [address] [place], [place] [address]  Comprehensive metabolic panel     Status: Abnormal   Collection Time: 04/05/19  9:24 AM  Result Value Ref Range   Sodium 134 (L) 135 - 145 mmol/L   Potassium 4.6 3.5 - 5.1 mmol/L   Chloride 100 98 - 111 mmol/L   CO2 27 22 - 32 mmol/L   Glucose, Bld 117 (H) 70 - 99 mg/dL   BUN 15 8 - 23 mg/dL   Creatinine, Ser 1.03 (H) 0.44 - 1.00 mg/dL   Calcium 9.1 8.9 - 10.3 mg/dL   Total Protein 7.2 6.5 - 8.1 g/dL   Albumin 3.4 (L) 3.5 - 5.0 g/dL   AST  43 (H) 15 - 41 U/L   ALT 56 (H) 0 - 44 U/L   Alkaline Phosphatase 50 38 - 126 U/L   Total Bilirubin 0.8 0.3 - 1.2 mg/dL   GFR calc non Af Amer 56 (L) >60 mL/min   GFR calc Af Amer >60 >60 mL/min   Anion gap 7 5 - 15    Comment: Performed at [facility], [address] [place], [place] [address]  Ehrlichia antibody panel     Status: None   Collection  Time: 04/05/19  9:24 AM  Result Value Ref Range   E chaffeensis (HGE) Ab, IgG Negative Neg:<1:64    Comment: (NOTE) HGE IgG levels are detectable 7 to 10 days post infection and persist approximately one year.    E chaffeensis (HGE) Ab, IgM Negative Neg:<1:20    Comment: (NOTE) Due to a reagent backorder, this test was performed using a different assay. The reference interval for this alternate assay is:                                       Negative      <1:64                                       Positive       1:64 or greater IgM levels usually rise 3 to 5 days post infection and fall to normal levels in approximately 30 to 60 days. Performed At: [facility] [place], [place] [id number] Rush Farmer MD [id number]    E.Chaffeensis (HME) IgG Negative Neg:<1:64   E. Chaffeensis (HME) IgM Titer Negative Neg:<1:20    Comment: (NOTE) IgG titers if 1:64 or greater indicate exposure or  acute and convalescent samples showing a four-fold increase, and/or the presence of IgM indicate recent or current infection.   Ferritin     Status: Abnormal   Collection Time: 04/05/19  9:24 AM  Result Value Ref Range   Ferritin 606 (H) 11 - 307 ng/mL    Comment: Performed at [facility], [address] [place],  [address]  HIV antibody (with reflex)     Status: None   Collection Time: 04/05/19  9:24 AM  Result Value Ref Range   HIV Screen 4th Generation wRfx Non Reactive Non Reactive    Comment: (NOTE) Performed At: [facility] [place], [place] [id number] Rush Farmer MD [id number]   Lactate Dehydrogenase (LDH)     Status: Abnormal   Collection Time: 04/05/19  9:24 AM  Result Value Ref Range   LDH 327 (H) 98 - 192 U/L    Comment: Performed at [facility], [address] [place],  [address]  QuantiFERON-TB Girtha Rm Plus     Status: None   Collection Time: 04/05/19  9:24 AM  Result Value Ref Range    QuantiFERON Incubation Incubation performed.    QuantiFERON-TB Gold Plus Negative Negative    Comment: (NOTE) Performed At: [facility] [place], [place] [id number] Rush Farmer MD [id number]   TSH     Status: None   Collection Time: 04/05/19  9:24 AM  Result Value Ref Range   TSH 1.193 0.350 - 4.500 uIU/mL    Comment: Performed by a 3rd Generation assay with a functional sensitivity of <=  0.01 uIU/mL. Performed at [facility], [address] [place], [place] [address]   Blood culture (routine single)     Status: None   Collection Time: 04/05/19  9:24 AM   Specimen: BLOOD  Result Value Ref Range   Specimen Description BLOOD LEFT ANTECUBITAL    Special Requests      BOTTLES DRAWN AEROBIC AND ANAEROBIC Blood Culture adequate volume   Culture      NO GROWTH 5 DAYS Performed at [facility], [address], [place], [place] [address]    Report Status 04/10/2019 FINAL   Blood culture (routine single)     Status: None   Collection Time: 04/05/19  9:24 AM   Specimen: BLOOD  Result Value Ref Range   Specimen Description BLOOD RIGHT ANTECUBITAL    Special Requests      BOTTLES DRAWN AEROBIC AND ANAEROBIC Blood Culture adequate volume   Culture      NO GROWTH 5 DAYS Performed at [facility], [address], [place], [place] [address]    Report Status 04/10/2019 FINAL   QuantiFERON-TB Gold Plus     Status: None   Collection Time: 04/05/19  9:24 AM  Result Value Ref Range   QuantiFERON Criteria Comment     Comment: (NOTE) The QuantiFERON-TB Gold Plus result is determined by subtracting the Nil value from either TB antigen (Ag) tube. The mitogen tube serves as a control for the test.    QuantiFERON TB1 Ag Value 0.13 IU/mL   QuantiFERON TB2 Ag Value 0.12 IU/mL   QuantiFERON Nil Value 0.10 IU/mL   QuantiFERON Mitogen Value 7.46 IU/mL    Comment: (NOTE) Performed At: [facility] [place], [place]  [id number] Rush Farmer MD SL:[id number]   CMV DNA, quantitative, PCR     Status: None   Collection Time: 04/05/19  9:24 AM  Result Value Ref Range   CMV DNA Quant 1,080 Negative IU/mL    Comment: (NOTE) The quantitative range of this assay is 200 to 1 million IU/mL. This test was developed and its performance characteristics determined by [facility]. It has not been cleared or approved by the Food and Drug Administration. The FDA has determined that such clearance or approval is not necessary.    Log10 CMV Qn DNA Pl 3.033 log10 IU/mL    Comment: (NOTE) Performed At: [facility] [place], [place] [id number] Rush Farmer MD DH:[id number]   [place] Serology (COVID 19)AB(IGG)IA     Status: None   Collection Time: 04/05/19  9:24 AM  Result Value Ref Range   SARS-CoV-2 Ab, IgG NON REACTIVE NON REACTIVE    Comment: (NOTE) Non-Reactive for SARS-CoV-2 IgG Antibodies.  SARS-CoV-2 IgG antibodies not detected.  Negative results do not preclude acute SARS-CoV-2 infection.  Negative results may occur in samples collected too soon following infection or in immunosuppressed patients.  Serologic results should not be used to diagnose or exclude active/recent SARS-CoV-2 infection.  If acute infection is suspected, direct testing for SARS-CoV-2 is necessary.   The expected result is Non-Reactive.  Fact Sheet for Recipients:  LimitBuy.nl  Fact Sheet for Healthcare Providers:  WordAgents.no  Testing was performed using the Beckman Coulter SARS-CoV-2 IgG assay.  This test is not yet approved or cleared by the [place] and has been authorized by FDA under an Emergency Use Authorization (EUA).  This EUA will remain in effect (meaning this test can be used) for the duration of the COVID-19 declaration under  Section 564(b)(1) of the  Act, 21 U.S.C. section 360bbb-3(b)(1), unless the authorization is  terminated or revoked sooner. Performed at [facility], [address] [place], [place] [address]   EPSTEIN-BARR VIRUS (EBV) Antibody Profile     Status: Abnormal   Collection Time: 04/05/19  9:24 AM  Result Value Ref Range   EBV VCA IgM >160.0 (H) 0.0 - 35.9 U/mL    Comment: (NOTE)                                 Negative        <36.0                                 Equivocal 36.0 - 43.9                                 Positive        >43.9    EBV VCA IgG 580.0 (H) 0.0 - 17.9 U/mL    Comment: (NOTE)                                 Negative        <18.0                                 Equivocal 18.0 - 21.9                                 Positive        >21.9    EBV NA IgG 526.0 (H) 0.0 - 17.9 U/mL    Comment: (NOTE)                                 Negative        <18.0                                 Equivocal 18.0 - 21.9                                 Positive        >21.9    Interpretation: Comment     Comment: (NOTE)               EBV Interpretation Chart Key: Antibody Present +    Antibody Absent - Interpretation             VCA-IgM   VCA-IgG  EBNA-IgG No previous infection/        -         -         - Susceptible Primary infection (new        +         +         - or recent) Past Infection               +or-       +         +  See comment below*            +         -         - *Results indicate infection with EBV at some time however cannot predict the timing of the infection since antibodies to EBNA usually develop after primary infection or, alternatively, approximately 5-10% of patients with EBV never develop antibodies to EBNA. Performed At: [facility] [place], [place] [id number] Rush Farmer MD WU:[id number]   Miscellaneous [facility] test (send-out)     Status: None   Collection Time: 04/05/19  9:24 AM  Result Value Ref Range   [facility] test code [id number]    [facility] test name Anti Nuclear Ab by IFA (RDL)    Misc [facility]  result See Scanned report in [place]: Performed at [facility], [place] [address], [place], [place] [address]  Hepatitis panel, acute     Status: None   Collection Time: 04/05/19  9:24 AM  Result Value Ref Range   Hepatitis B Surface Ag Negative     Comment: Reference Range:  Negative   HCV Ab <0.1 0.0 - 0.9 s/co ratio    Comment: (NOTE)                                  Negative:     < 0.8                             Indeterminate: 0.8 - 0.9                                  Positive:     > 0.9 The CDC recommends that a positive HCV antibody result be followed up with a HCV Nucleic Acid Amplification test (846962). Performed At: [facility] [place], [place] [id number] Rush Farmer MD [id number]    Hep A IgM Negative Negative   Hep B C IgM Negative     Comment: Reference Range:  Negative Performed at [facility], [place] [address] [place], [place] [address]   RNP Antibody     Status: Abnormal   Collection Time: 04/30/19  9:03 AM  Result Value Ref Range   Ribonucleic Protein(ENA) Antibody, IgG >8.0 POS (A) <1.0 NEG AI  Comprehensive metabolic panel     Status: Abnormal   Collection Time: 04/30/19  9:03 AM  Result Value Ref Range   Sodium 138 135 - 145 mEq/L   Potassium 4.1 3.5 - 5.1 mEq/L   Chloride 101 96 - 112 mEq/L   CO2 31 19 - 32 mEq/L   Glucose, Bld 147 (H) 70 - 99 mg/dL   BUN 13 6 - 23 mg/dL   Creatinine, Ser 1.18 0.40 - 1.20 mg/dL   Total Bilirubin 0.7 0.2 - 1.2 mg/dL   Alkaline Phosphatase 53 39 - 117 U/L   AST 24 0 - 37 U/L   ALT 32 0 - 35 U/L   Total Protein 7.2 6.0 - 8.3 g/dL   Albumin 4.0 3.5 - 5.2 g/dL   Calcium 9.7 8.4 - 10.5 mg/dL   GFR 45.58 (L) >60.00 mL/min  ANA     Status: None   Collection Time: 04/30/19  9:03 AM  Result  Value Ref Range   Anti Nuclear Antibody (ANA) NEGATIVE NEGATIVE    Comment: ANA IFA is a first line screen for detecting the presence of up to approximately 150  autoantibodies in various autoimmune diseases. A negative ANA IFA result suggests an ANA-associated autoimmune disease is not present at this time, but is not definitive. If there is high clinical suspicion for Sjogren's syndrome, testing for anti-SS-A/Ro antibody should be considered. Anti-Jo-1 antibody should be considered for clinically suspected inflammatory myopathies. . AC-0: Negative . International Consensus on ANA Patterns (https://www.hernandez-brewer.com/) . For additional information, please refer to http://education.[facility].com/faq/FAQ177 (This link is being provided for informational/ educational purposes only.) .   Mag     Status: None   Collection Time: 06/01/19  8:37 AM  Result Value Ref Range   Magnesium 2.0 1.6 - 2.3 mg/dL  CK (Creatine Kinase)     Status: None   Collection Time: 06/01/19  8:37 AM  Result Value Ref Range   Total CK 69 32 - 182 U/L  CMET - [facility]     Status: Abnormal   Collection Time: 06/01/19  8:37 AM  Result Value Ref Range   Glucose 144 (H) 65 - 99 mg/dL   BUN 13 8 - 27 mg/dL   Creatinine, Ser 0.87 0.57 - 1.00 mg/dL   GFR calc non Af Amer 69 >59 mL/min/1.73   GFR calc Af Amer 80 >59 mL/min/1.73   BUN/Creatinine Ratio 15 12 - 28   Sodium 139 134 - 144 mmol/L   Potassium 4.5 3.5 - 5.2 mmol/L   Chloride 101 96 - 106 mmol/L   CO2 23 20 - 29 mmol/L   Calcium 9.7 8.7 - 10.3 mg/dL   Total Protein 7.0 6.0 - 8.5 g/dL   Albumin 4.1 3.8 - 4.8 g/dL   Globulin, Total 2.9 1.5 - 4.5 g/dL   Albumin/Globulin Ratio 1.4 1.2 - 2.2   Bilirubin Total 0.6 0.0 - 1.2 mg/dL   Alkaline Phosphatase 68 39 - 117 IU/L   AST 20 0 - 40 IU/L   ALT 30 0 - 32 IU/L  CBC w/Diff/Platelet     Status: Abnormal   Collection Time: 06/01/19  8:37 AM  Result Value Ref Range   WBC 6.7 3.4 - 10.8 x10E3/uL   RBC 4.49 3.77 - 5.28 x10E6/uL   Hemoglobin 13.9 11.1 - 15.9 g/dL   Hematocrit 40.1 34.0 - 46.6 %   MCV 89 79 - 97 fL   MCH 31.0 26.6 - 33.0 pg    MCHC 34.7 31.5 - 35.7 g/dL   RDW 13.1 11.7 - 15.4 %   Platelets 231 150 - 450 x10E3/uL   Neutrophils 37 Not Estab. %   Lymphs 51 Not Estab. %   Monocytes 8 Not Estab. %   Eos 3 Not Estab. %   Basos 1 Not Estab. %   Neutrophils Absolute 2.5 1.4 - 7.0 x10E3/uL   Lymphocytes Absolute 3.4 (H) 0.7 - 3.1 x10E3/uL   Monocytes Absolute 0.6 0.1 - 0.9 x10E3/uL   EOS (ABSOLUTE) 0.2 0.0 - 0.4 x10E3/uL   Basophils Absolute 0.1 0.0 - 0.2 x10E3/uL   Immature Granulocytes 0 Not Estab. %   Immature Grans (Abs) 0.0 0.0 - 0.1 Q00Q6/PY  Basic metabolic panel     Status: Abnormal   Collection Time: 06/14/19  8:42 PM  Result Value Ref Range   Sodium 138 135 - 145 mmol/L   Potassium 3.8 3.5 - 5.1 mmol/L   Chloride 106 98 - 111 mmol/L  CO2 23 22 - 32 mmol/L   Glucose, Bld 136 (H) 70 - 99 mg/dL   BUN 18 8 - 23 mg/dL   Creatinine, Ser 1.38 (H) 0.44 - 1.00 mg/dL   Calcium 9.1 8.9 - 10.3 mg/dL   GFR calc non Af Amer 39 (L) >60 mL/min   GFR calc Af Amer 46 (L) >60 mL/min   Anion gap 9 5 - 15    Comment: Performed at [facility], [address] [place], [place] [address]  CBC     Status: None   Collection Time: 06/14/19  8:42 PM  Result Value Ref Range   WBC 8.3 4.0 - 10.5 K/uL   RBC 4.61 3.87 - 5.11 MIL/uL   Hemoglobin 14.0 12.0 - 15.0 g/dL   HCT 41.0 36.0 - 46.0 %   MCV 88.9 80.0 - 100.0 fL   MCH 30.4 26.0 - 34.0 pg   MCHC 34.1 30.0 - 36.0 g/dL   RDW 12.9 11.5 - 15.5 %   Platelets 249 150 - 400 K/uL   nRBC 0.0 0.0 - 0.2 %    Comment: Performed at [facility], [place], [place] [address]  Troponin I (High Sensitivity)     Status: None   Collection Time: 06/14/19  8:42 PM  Result Value Ref Range   Troponin I (High Sensitivity) 6 <18 ng/L    Comment: (NOTE) Elevated high sensitivity troponin I (hsTnI) values and significant  changes across serial measurements may suggest ACS but many other  chronic and acute conditions are known to elevate hsTnI results.    Refer to the "Links" section for chest pain algorithms and additional  guidance. Performed at [facility], [place], [place] [address]   Troponin I (High Sensitivity)     Status: None   Collection Time: 06/14/19 11:25 PM  Result Value Ref Range   Troponin I (High Sensitivity) 8 <18 ng/L    Comment: (NOTE) Elevated high sensitivity troponin I (hsTnI) values and significant  changes across serial measurements may suggest ACS but many other  chronic and acute conditions are known to elevate hsTnI results.  Refer to the "Links" section for chest pain algorithms and additional  guidance. Performed at [facility], [address] [place], [place] [address]   Urinalysis, Complete w Microscopic     Status: Abnormal   Collection Time: 06/14/19 11:25 PM  Result Value Ref Range   Color, Urine YELLOW (A) YELLOW   APPearance CLEAR (A) CLEAR   Specific Gravity, Urine 1.014 1.005 - 1.030   pH 6.0 5.0 - 8.0   Glucose, UA NEGATIVE NEGATIVE mg/dL   Hgb urine dipstick NEGATIVE NEGATIVE   Bilirubin Urine NEGATIVE NEGATIVE   Ketones, ur NEGATIVE NEGATIVE mg/dL   Protein, ur NEGATIVE NEGATIVE mg/dL   Nitrite NEGATIVE NEGATIVE   Leukocytes,Ua NEGATIVE NEGATIVE   WBC, UA 0-5 0 - 5 WBC/hpf   Bacteria, UA NONE SEEN NONE SEEN   Squamous Epithelial / LPF 0-5 0 - 5    Comment: Performed at [facility], [address] [place], [place] [address]    Diabetic Foot Exam: Diabetic Foot Exam - Simple   Simple Foot Form Diabetic Foot exam was performed with the following findings: Yes 06/25/2019 11:57 AM  Visual Inspection Sensation Testing Pulse Check Comments      PHQ2/9: Depression screen [facility] 2/9 06/25/2019 05/28/2019 10/14/2018 09/25/2017 07/10/2017  Decreased Interest 0 1 0 0 0  Down, Depressed, Hopeless  0 0 0 0 0  PHQ - 2 Score 0 1 0 0 0  Altered sleeping 0 3 - - -  Tired, decreased energy 0 2 - - -  Change in appetite 0 0 - - -  Feeling bad or  failure about yourself  0 0 - - -  Trouble concentrating 0 0 - - -  Moving slowly or fidgety/restless 0 1 - - -  Suicidal thoughts 0 0 - - -  PHQ-9 Score 0 7 - - -  Difficult doing work/chores Not difficult at all Somewhat difficult - - -    phq 9 is negative reviewed  Fall Risk: Fall Risk  06/25/2019 05/28/2019 09/25/2017 07/10/2017  Falls in the past year? 1 1 No No  Number falls in past yr: 1 0 - -  Comment - First October 2020 - -  Injury with Fall? 0 1 - -  Comment - Contusion on her buttock - -  Risk for fall due to : - Other (Comment) - -  Risk for fall due to: Comment - Left leg locked up - -      Functional Status Survey: Is the patient deaf or have difficulty hearing?: No Does the patient have difficulty seeing, even when wearing glasses/contacts?: No Does the patient have difficulty concentrating, remembering, or making decisions?: No Does the patient have difficulty walking or climbing stairs?: No Does the patient have difficulty dressing or bathing?: No Does the patient have difficulty doing errands alone such as visiting a doctor's office or shopping?: No    Assessment & Plan:     ICD-10-CM   1. New onset type 2 diabetes mellitus ([place])  E11.9 Ambulatory referral to Ophthalmology    CMP w GFR    A1C    Microalbumin, urine   No medications, blood sugars have been high we will recheck A1c, foot exam today due for eye exam   2. Essential hypertension  I10 CMP w GFR   Blood pressure is well controlled on lisinopril 10 and metoprolol 25 mg twice daily seeing cardiology will recheck labs Would have low threshold to decrease her medications if blood pressure is soft low or she is having any symptoms of hypotension   3. Hyperlipidemia, unspecified hyperlipidemia type  E78.5 CMP w GFR    Lipid Panel   She held her Lipitor was instructed to restart it but had not restarted it she agrees to now with clarification will recheck labs  Clarified that her CK labs were  normal, there was no improvement with holding her statin, she will resume it if any symptoms worsen she was instructed to at least take it 3 times a week   4. Muscle spasm  M62.838 CBC w/ Diff    CMP w GFR    Mag    TSH   Refill on muscle relaxers Previously check CK, had no microcytosis, iron deficiency she has supplemented magnesium before without any change -unclear etiology We will recheck hemoglobin cell indices, chemistry electrolytes magnesium thyroid   5. AKI (acute kidney injury) ([place])  N17.9 CMP w GFR    Microalbumin, urine   Recheck renal function following AKI in the ER earlier this month Patient states there is no way that she could be dehydrated she is pushing a lot of fluids all day long while look for other possible causes of dehydration including nephropathy, liver dysfunction, may be SIADH?  Or adrenal insufficiency?  If any concerning abnormalities in lab work may also try  to refer to endocrinology for assessment   6. Myalgia  M79.10 [place] Lower Ext Art Bilat    CBC w/ Diff    Mag    TSH   Continued myalgias with CK negative, will recheck electrolytes, magnesium, holding statin did not improve, will check ABIs to rule out PAD, likely need rheum On exam all muscle compartments are soft, good strength and sensation, normal gait   7. Muscle weakness  M62.81 [place] Lower Ext Art Bilat    CBC w/ Diff    Mag    TSH   Patient endorses muscle weakness but has grossly normal strength to lower extremities on exam Will get ABI screening - r/o PAD/claudication   8. Encounter for medication monitoring  Z51.81 CBC w/ Diff    CMP w GFR    Lipid Panel    A1C    Mag    TSH    Microalbumin, urine  9. Need for influenza vaccination  Z23 CANCELED: Flu Vaccine QUAD High Dose(Fluad)  10. Positive sm/RNP antibody  R76.8    Her symptoms since having CMV may be connective tissue disorder with a positive antibody ID was not going to follow refer to rheumatology  Will add referral after labs  result I have attempted to communicate with specialist who ran the tests in the past but both have stated that they were no longer following the patient and referred to the other would be appropriate for rheumatology to work-up the positive sm/RNP antibody for connective tissue disorders -did explain to patient how some markers of autoimmune or inflammatory diseases can become positive after viral infections and that the nuanced work-up can be done by rheumatology    Greater than 50% of this visit was spent in direct face-to-face counseling, obtaining history and physical, discussing and educating pt on treatment plan.  Total time of this visit was 45 min.  Remainder of time involved but was not limited to reviewing chart (recent and pertinent OV notes and labs), documentation in EMR, and coordinating care and treatment plan. Patient new to care, has new conditions, several abnormal labs, ER visit, has seen new specialist all of this recent and abnormal medical history and recent visits and work-ups were reviewed today as well as reviewing multiple chronic and acute complaints and reviewing plan with the patient today.   Return in about 3 months (around 09/23/2019) for Routine follow-up.   Delsa Grana, PA-C 06/25/19 11:30 AM

## 2019-06-26 LAB — COMPLETE METABOLIC PANEL WITH GFR
AG Ratio: 1.3 (calc) (ref 1.0–2.5)
ALT: 16 U/L (ref 6–29)
AST: 15 U/L (ref 10–35)
Albumin: 3.9 g/dL (ref 3.6–5.1)
Alkaline phosphatase (APISO): 65 U/L (ref 37–153)
BUN: 13 mg/dL (ref 7–25)
CO2: 28 mmol/L (ref 20–32)
Calcium: 9.5 mg/dL (ref 8.6–10.4)
Chloride: 102 mmol/L (ref 98–110)
Creat: 0.98 mg/dL (ref 0.50–0.99)
GFR, Est African American: 69 mL/min/{1.73_m2} (ref >=60)
GFR, Est Non African American: 60 mL/min/{1.73_m2} (ref >=60)
Globulin: 3 g/dL (calc) (ref 1.9–3.7)
Glucose, Bld: 105 mg/dL — ABNORMAL HIGH (ref 65–99)
Potassium: 4 mmol/L (ref 3.5–5.3)
Sodium: 138 mmol/L (ref 135–146)
Total Bilirubin: 0.4 mg/dL (ref 0.2–1.2)
Total Protein: 6.9 g/dL (ref 6.1–8.1)

## 2019-06-26 LAB — TSH: TSH: 1.08 mIU/L (ref 0.40–4.50)

## 2019-06-26 LAB — LIPID PANEL
Cholesterol: 182 mg/dL (ref <200)
HDL: 37 mg/dL — ABNORMAL LOW (ref >=50)
LDL Cholesterol (Calc): 117 mg/dL (calc) — ABNORMAL HIGH
Non-HDL Cholesterol (Calc): 145 mg/dL (calc) — ABNORMAL HIGH (ref <130)
Total CHOL/HDL Ratio: 4.9 (calc) (ref <5.0)
Triglycerides: 164 mg/dL — ABNORMAL HIGH (ref <150)

## 2019-06-26 LAB — CBC WITH DIFFERENTIAL/PLATELET
Absolute Monocytes: 855 cells/uL (ref 200–950)
Basophils Absolute: 117 cells/uL (ref 0–200)
Basophils Relative: 1.3 %
Eosinophils Absolute: 360 cells/uL (ref 15–500)
Eosinophils Relative: 4 %
HCT: 40.5 % (ref 35.0–45.0)
Hemoglobin: 13.7 g/dL (ref 11.7–15.5)
Lymphs Abs: 3366 cells/uL (ref 850–3900)
MCH: 30.5 pg (ref 27.0–33.0)
MCHC: 33.8 g/dL (ref 32.0–36.0)
MCV: 90.2 fL (ref 80.0–100.0)
MPV: 9.7 fL (ref 7.5–12.5)
Monocytes Relative: 9.5 %
Neutro Abs: 4302 cells/uL (ref 1500–7800)
Neutrophils Relative %: 47.8 %
Platelets: 295 10*3/uL (ref 140–400)
RBC: 4.49 10*6/uL (ref 3.80–5.10)
RDW: 12.6 % (ref 11.0–15.0)
Total Lymphocyte: 37.4 %
WBC: 9 10*3/uL (ref 3.8–10.8)

## 2019-06-26 LAB — HEMOGLOBIN A1C
Hgb A1c MFr Bld: 5.8 % of total Hgb — ABNORMAL HIGH (ref <5.7)
Mean Plasma Glucose: 120 (calc)
eAG (mmol/L): 6.6 (calc)

## 2019-06-26 LAB — MICROALBUMIN, URINE: Microalb, Ur: <0.2 mg/dL

## 2019-06-26 LAB — MAGNESIUM: Magnesium: 2 mg/dL (ref 1.5–2.5)

## 2019-06-29 ENCOUNTER — Other Ambulatory Visit: Payer: Self-pay | Admitting: Internal Medicine

## 2019-06-29 DIAGNOSIS — I479 Paroxysmal tachycardia, unspecified: Secondary | ICD-10-CM | POA: Diagnosis not present

## 2019-07-06 ENCOUNTER — Encounter: Payer: Self-pay | Admitting: Family Medicine

## 2019-07-12 ENCOUNTER — Ambulatory Visit
Admission: RE | Admit: 2019-07-12 | Discharge: 2019-07-12 | Disposition: A | Discharge status: Home / Self Care | Payer: Medicare HMO | Source: Ambulatory Visit | Attending: Family Medicine | Admitting: Family Medicine

## 2019-07-12 ENCOUNTER — Other Ambulatory Visit: Payer: Self-pay | Admitting: Internal Medicine

## 2019-07-12 ENCOUNTER — Other Ambulatory Visit: Payer: Self-pay

## 2019-07-12 ENCOUNTER — Other Ambulatory Visit: Payer: Self-pay | Admitting: Family Medicine

## 2019-07-12 DIAGNOSIS — M79605 Pain in left leg: Secondary | ICD-10-CM | POA: Diagnosis not present

## 2019-07-12 DIAGNOSIS — M79604 Pain in right leg: Secondary | ICD-10-CM | POA: Diagnosis not present

## 2019-07-12 DIAGNOSIS — M791 Myalgia, unspecified site: Secondary | ICD-10-CM | POA: Diagnosis not present

## 2019-07-12 DIAGNOSIS — M6281 Muscle weakness (generalized): Secondary | ICD-10-CM

## 2019-07-13 ENCOUNTER — Other Ambulatory Visit: Payer: Self-pay | Admitting: Family Medicine

## 2019-07-13 ENCOUNTER — Encounter: Payer: Self-pay | Admitting: Family Medicine

## 2019-07-13 DIAGNOSIS — R768 Other specified abnormal immunological findings in serum: Secondary | ICD-10-CM

## 2019-07-13 DIAGNOSIS — M6281 Muscle weakness (generalized): Secondary | ICD-10-CM

## 2019-07-13 DIAGNOSIS — M791 Myalgia, unspecified site: Secondary | ICD-10-CM

## 2019-07-15 ENCOUNTER — Other Ambulatory Visit: Payer: Self-pay | Admitting: Internal Medicine

## 2019-07-15 DIAGNOSIS — R69 Illness, unspecified: Secondary | ICD-10-CM | POA: Diagnosis not present

## 2019-07-15 DIAGNOSIS — R002 Palpitations: Secondary | ICD-10-CM | POA: Diagnosis not present

## 2019-07-19 NOTE — Progress Notes (Signed)
Cardiology Office Note  Date:  07/21/2019   ID:  Jacqueline [name], DOB Feb 16, 1951, MRN [id number]  PCP:  Delsa Grana, PA-C   Chief Complaint  Patient presents with  . office visit    1 month F/U after wearing ZIO monitor; Meds verbally reviewed with patient.    HPI:  Jacqueline [name] is a 69 year old woman with past medical history of Hypertension Hyperlipidemia Borderline diabetes Former smoker, quit 2013 Seen in the emergency room yesterday for hypertension She presents today for paroxysmal tachycardia, hypertension  In follow-up today, palpitations have improved somewhat Initially started on beta-blocker daily then up to twice daily dosing for symptoms  Event monitor, results reviewed avg HR of 79 bpm. Predominant underlying rhythm was Sinus Rhythm. 5  Supraventricular Tachycardia runs occurred, the run with the fastest interval lasting 5 beats with a max rate of 250 bpm, the longest lasting 6 beats with an avg rate of 125 bpm. Isolated SVEs were rare (<1.0%), SVE Couplets were rare (<1.0%), and SVE Triplets were rare (<1.0%). Isolated VEs were rare (<1.0%), VE Couplets were rare (<1.0%), and no VE Triplets were present. Ventricular Bigeminy and Trigeminy were present.  Patient triggered events associated with symptomatic PVCs  Previous trip to the emergency room for tachycardia prior to starting metoprolol  EKG personally reviewed by myself on todays visit Normal sinus rhythm rate 68 bpm no significant ST-T wave changes   PMH:   has a past medical history of Calcium oxalate renal stones, Hyperlipidemia, Hypertension, and MVP (mitral valve prolapse).  PSH:    Past Surgical History:  Procedure Laterality Date  . BREAST BIOPSY Right 08/19/2012   neg, Dr. Bary Castilla  . extraction of renal stones    . TONSILLECTOMY AND ADENOIDECTOMY    . VAGINAL HYSTERECTOMY  1980   class IV, ovaries intact    Current Outpatient Medications  Medication Sig Dispense Refill  .  atorvastatin (LIPITOR) 10 MG tablet TAKE 1 TABLET (10 MG TOTAL) BY MOUTH DAILY. MUST SCHEDULE ANNUAL PHYSICAL 90 tablet 3  . blood glucose meter kit and supplies KIT Lifescan meter Check blood sugar 2 times daily as needed 1 each 0  . calcium carbonate (CALCIUM 600) 600 MG TABS tablet Take 1 tablet (600 mg total) by mouth 2 (two) times daily with a meal. 30 tablet 0  . Cholecalciferol (VITAMIN D) 50 MCG (2000 UT) CAPS Take by mouth. 30 capsule   . Lancets (ONETOUCH DELICA PLUS [id number]) MISC USE AS DIRECTED TWICE A DAY AS NEEDED    . lisinopril (ZESTRIL) 10 MG tablet Take 1 tablet (10 mg total) by mouth daily. 30 tablet 2  . metoprolol tartrate (LOPRESSOR) 25 MG tablet Take 1 tablet (25 mg total) by mouth 2 (two) times daily as needed. 60 tablet 3  . Multiple Vitamin (MULTIVITAMIN) tablet Take 1 tablet by mouth daily.      Glory Rosebush ULTRA test strip USE AS DIRECTED TWICE A DAY AS NEEDED 200 strip 1  . OVER THE COUNTER MEDICATION Take 100 mg by mouth daily. Potassium    . OVER THE COUNTER MEDICATION Take 400 mg by mouth daily. Magnesium    . tiZANidine (ZANAFLEX) 2 MG tablet Take 1-2 tablets (2-4 mg total) by mouth every 8 (eight) hours as needed for muscle spasms. 90 tablet 2  . traZODone (DESYREL) 50 MG tablet TAKE 0.5-1 TABLETS (25-50 MG TOTAL) BY MOUTH AT BEDTIME. 90 tablet 0   No current facility-administered medications for this visit.     Allergies:  Patient has no known allergies.   Social History:  The patient  reports that she quit smoking about 5 years ago. Her smoking use included cigarettes. She started smoking about 36 years ago. She has a 7.50 pack-year smoking history. She has never used smokeless tobacco. She reports current alcohol use of about 1.0 standard drinks of alcohol per week. She reports that she does not use drugs.   Family History:   family history includes Cancer in her maternal aunt; Cancer - Other in her father; Dementia in her mother; Heart disease in her  maternal grandfather and maternal grandmother; Hypertension in her mother and paternal grandmother; Stroke in her mother and paternal grandfather.    Review of Systems: Review of Systems  Constitutional: Negative.   HENT: Negative.   Respiratory: Negative.   Cardiovascular: Negative.   Gastrointestinal: Negative.   Musculoskeletal: Negative.   Neurological: Negative.   Psychiatric/Behavioral: Negative.   All other systems reviewed and are negative.   PHYSICAL EXAM: VS:  BP 110/70 (BP Location: Left Arm, Patient Position: Sitting, Cuff Size: Normal)   Pulse 68   Ht 5' 4" (1.626 m)   Wt 186 lb 8 oz (84.6 kg)   SpO2 98%   BMI 32.01 kg/m  , BMI Body mass index is 32.01 kg/m. Constitutional:  oriented to person, place, and time. No distress.  HENT:  Head: Grossly normal Eyes:  no discharge. No scleral icterus.  Neck: No JVD, no carotid bruits  Cardiovascular: Regular rate and rhythm, no murmurs appreciated Pulmonary/Chest: Clear to auscultation bilaterally, no wheezes or rails Abdominal: Soft.  no distension.  no tenderness.  Musculoskeletal: Normal range of motion Neurological:  normal muscle tone. Coordination normal. No atrophy Skin: Skin warm and dry Psychiatric: normal affect, pleasant  Recent Labs: 06/25/2019: ALT 16; BUN 13; Creat 0.98; Hemoglobin 13.7; Magnesium 2.0; Platelets 295; Potassium 4.0; Sodium 138; TSH 1.08    Lipid Panel Lab Results  Component Value Date   CHOL 182 06/25/2019   HDL 37 (L) 06/25/2019   LDLCALC 117 (H) 06/25/2019   TRIG 164 (H) 06/25/2019    Wt Readings from Last 3 Encounters:  07/21/19 186 lb 8 oz (84.6 kg)  06/25/19 187 lb (84.8 kg)  06/15/19 188 lb 8 oz (85.5 kg)     ASSESSMENT AND PLAN:  Problem List Items Addressed This Visit      Cardiology Problems   HLD (hyperlipidemia)   Essential hypertension    Other Visit Diagnoses    Paroxysmal tachycardia ([place])    -  Primary   Relevant Orders   EKG 12-Lead     SVT,  PVCs Having symptomatic PVCs Some improvement on metoprolol twice daily We will continue current dose Could take extra metoprolol as needed for breakthrough arrhythmia  Hypertension Blood pressure well controlled, continue metoprolol twice daily  Preventive care Discussed Zetia for cholesterol if she prefers not to be on a statin -Discussed CT coronary calcium scoring for risk stratification given prior smoking history  Disposition:   F/U  12 month   Total encounter time more than 25 minutes  Greater than 50% was spent in counseling and coordination of care with the patient    Signed, Esmond Plants, M.D., Ph.D. [place], [place]

## 2019-07-21 ENCOUNTER — Encounter: Payer: Self-pay | Admitting: Cardiovascular Disease

## 2019-07-21 ENCOUNTER — Ambulatory Visit (INDEPENDENT_AMBULATORY_CARE_PROVIDER_SITE_OTHER): Payer: Medicare HMO | Admitting: Cardiovascular Disease

## 2019-07-21 ENCOUNTER — Other Ambulatory Visit: Payer: Self-pay

## 2019-07-21 VITALS — BP 110/70 | HR 68 | Ht 64.0 in | Wt 186.5 lb

## 2019-07-21 DIAGNOSIS — I479 Paroxysmal tachycardia, unspecified: Secondary | ICD-10-CM

## 2019-07-21 DIAGNOSIS — E782 Mixed hyperlipidemia: Secondary | ICD-10-CM

## 2019-07-21 DIAGNOSIS — I10 Essential (primary) hypertension: Secondary | ICD-10-CM | POA: Diagnosis not present

## 2019-07-21 MED ORDER — METOPROLOL TARTRATE 25 MG PO TABS: 25.0000 mg | ORAL_TABLET | Freq: Two times a day (BID) | ORAL | 3 refills | Status: DC

## 2019-07-21 NOTE — Patient Instructions (Addendum)
Consider zetia/ezetimibe, cholesterol pill  Read about CT coronary calcium score   Medication Instructions:  Stay on metoprolol 25 twice a day  If you need a refill on your cardiac medications before your next appointment, please call your pharmacy.    Lab work: No new labs needed   If you have labs (blood work) drawn today and your tests are completely normal, you will receive your results only by: Marland Kitchen MyChart Message (if you have MyChart) OR . A paper copy in the mail If you have any lab test that is abnormal or we need to change your treatment, we will call you to review the results.   Testing/Procedures: No new testing needed   Follow-Up: At [facility], you and your health needs are our priority.  As part of our continuing mission to provide you with exceptional heart care, we have created designated Provider Care Teams.  These Care Teams include your primary Cardiologist (physician) and Advanced Practice Providers (APPs -  Physician Assistants and Nurse Practitioners) who all work together to provide you with the care you need, when you need it.  . You will need a follow up appointment in 12 months   . Providers on your designated Care Team:   . Murray Hodgkins, NP . Christell Faith, PA-C . Marrianne Mood, PA-C  Any Other Special Instructions Will Be Listed Below (If Applicable).  For educational health videos Log in to : [email or web address] Or : [email or web address], password : [email or web address]   COVID-19 Vaccine Information can be found at: [email or web address].uk For questions related to vaccine distribution or appointments, please email vaccine@[email or web address] .[email or web address] or call [phone].

## 2019-07-22 ENCOUNTER — Other Ambulatory Visit: Payer: Self-pay | Admitting: Family Medicine

## 2019-07-29 DIAGNOSIS — H269 Unspecified cataract: Secondary | ICD-10-CM | POA: Diagnosis not present

## 2019-07-29 LAB — HM DIABETES EYE EXAM

## 2019-07-30 ENCOUNTER — Other Ambulatory Visit: Payer: Self-pay

## 2019-07-30 ENCOUNTER — Ambulatory Visit (INDEPENDENT_AMBULATORY_CARE_PROVIDER_SITE_OTHER): Payer: Medicare HMO

## 2019-07-30 VITALS — BP 126/74 | Temp 98.2°F | Ht 64.0 in | Wt 187.0 lb

## 2019-07-30 DIAGNOSIS — Z0000 Encounter for general adult medical examination without abnormal findings: Secondary | ICD-10-CM

## 2019-07-30 NOTE — Patient Instructions (Signed)
Jacqueline [name] , Thank you for taking time to come for your Medicare Wellness Visit. I appreciate your ongoing commitment to your health goals. Please review the following plan we discussed and let me know if I can assist you in the future.   Screening recommendations/referrals: Colonoscopy: done 08/14/12. Repeat in 2024. Mammogram: done 12/29/18 Bone Density: done 12/22/18 Recommended yearly ophthalmology/optometry visit for glaucoma screening and checkup Recommended yearly dental visit for hygiene and checkup  Vaccinations: Influenza vaccine: due Pneumococcal vaccine: due Tdap vaccine: done 11/27/09 Shingles vaccine: Shingrix discussed. Please contact your pharmacy for coverage information.   Advanced directives: Advance directive discussed with you today. I have provided a copy for you to complete at home and have notarized. Once this is complete please bring a copy in to our office so we can scan it into your chart.  Conditions/risks identified: Recommend increasing physical activity as tolerated  Next appointment: Please follow up in one year for your Medicare Annual Wellness visit.     Preventive Care 43 Years and Older, Female Preventive care refers to lifestyle choices and visits with your health care provider that can promote health and wellness. What does preventive care include?  A yearly physical exam. This is also called an annual well check.  Dental exams once or twice a year.  Routine eye exams. Ask your health care provider how often you should have your eyes checked.  Personal lifestyle choices, including:  Daily care of your teeth and gums.  Regular physical activity.  Eating a healthy diet.  Avoiding tobacco and drug use.  Limiting alcohol use.  Practicing safe sex.  Taking low-dose aspirin every day.  Taking vitamin and mineral supplements as recommended by your health care provider. What happens during an annual well check? The services and screenings  done by your health care provider during your annual well check will depend on your age, overall health, lifestyle risk factors, and family history of disease. Counseling  Your health care provider may ask you questions about your:  Alcohol use.  Tobacco use.  Drug use.  Emotional well-being.  Home and relationship well-being.  Sexual activity.  Eating habits.  History of falls.  Memory and ability to understand (cognition).  Work and work Statistician.  Reproductive health. Screening  You may have the following tests or measurements:  Height, weight, and BMI.  Blood pressure.  Lipid and cholesterol levels. These may be checked every 5 years, or more frequently if you are over 42 years old.  Skin check.  Lung cancer screening. You may have this screening every year starting at age 68 if you have a 30-pack-year history of smoking and currently smoke or have quit within the past 15 years.  Fecal occult blood test (FOBT) of the stool. You may have this test every year starting at age 68.  Flexible sigmoidoscopy or colonoscopy. You may have a sigmoidoscopy every 5 years or a colonoscopy every 10 years starting at age 68.  Hepatitis C blood test.  Hepatitis B blood test.  Sexually transmitted disease (STD) testing.  Diabetes screening. This is done by checking your blood sugar (glucose) after you have not eaten for a while (fasting). You may have this done every 1-3 years.  Bone density scan. This is done to screen for osteoporosis. You may have this done starting at age 68.  Mammogram. This may be done every 1-2 years. Talk to your health care provider about how often you should have regular mammograms. Talk with your health  care provider about your test results, treatment options, and if necessary, the need for more tests. Vaccines  Your health care provider may recommend certain vaccines, such as:  Influenza vaccine. This is recommended every year.  Tetanus,  diphtheria, and acellular pertussis (Tdap, Td) vaccine. You may need a Td booster every 10 years.  Zoster vaccine. You may need this after age 68.  Pneumococcal 13-valent conjugate (PCV13) vaccine. One dose is recommended after age 68.  Pneumococcal polysaccharide (PPSV23) vaccine. One dose is recommended after age 68. Talk to your health care provider about which screenings and vaccines you need and how often you need them. This information is not intended to replace advice given to you by your health care provider. Make sure you discuss any questions you have with your health care provider. Document Released: 07/21/2015 Document Revised: 03/13/2016 Document Reviewed: 04/25/2015 Elsevier Interactive Patient Education  2017 [place] Prevention in the Home Falls can cause injuries. They can happen to people of all ages. There are many things you can do to make your home safe and to help prevent falls. What can I do on the outside of my home?  Regularly fix the edges of walkways and driveways and fix any cracks.  Remove anything that might make you trip as you walk through a door, such as a raised step or threshold.  Trim any bushes or trees on the path to your home.  Use bright outdoor lighting.  Clear any walking paths of anything that might make someone trip, such as rocks or tools.  Regularly check to see if handrails are loose or broken. Make sure that both sides of any steps have handrails.  Any raised decks and porches should have guardrails on the edges.  Have any leaves, snow, or ice cleared regularly.  Use sand or salt on walking paths during winter.  Clean up any spills in your garage right away. This includes oil or grease spills. What can I do in the bathroom?  Use night lights.  Install grab bars by the toilet and in the tub and shower. Do not use towel bars as grab bars.  Use non-skid mats or decals in the tub or shower.  If you need to sit down in  the shower, use a plastic, non-slip stool.  Keep the floor dry. Clean up any water that spills on the floor as soon as it happens.  Remove soap buildup in the tub or shower regularly.  Attach bath mats securely with double-sided non-slip rug tape.  Do not have throw rugs and other things on the floor that can make you trip. What can I do in the bedroom?  Use night lights.  Make sure that you have a light by your bed that is easy to reach.  Do not use any sheets or blankets that are too big for your bed. They should not hang down onto the floor.  Have a firm chair that has side arms. You can use this for support while you get dressed.  Do not have throw rugs and other things on the floor that can make you trip. What can I do in the kitchen?  Clean up any spills right away.  Avoid walking on wet floors.  Keep items that you use a lot in easy-to-reach places.  If you need to reach something above you, use a strong step stool that has a grab bar.  Keep electrical cords out of the way.  Do not use floor  polish or wax that makes floors slippery. If you must use wax, use non-skid floor wax.  Do not have throw rugs and other things on the floor that can make you trip. What can I do with my stairs?  Do not leave any items on the stairs.  Make sure that there are handrails on both sides of the stairs and use them. Fix handrails that are broken or loose. Make sure that handrails are as long as the stairways.  Check any carpeting to make sure that it is firmly attached to the stairs. Fix any carpet that is loose or worn.  Avoid having throw rugs at the top or bottom of the stairs. If you do have throw rugs, attach them to the floor with carpet tape.  Make sure that you have a light switch at the top of the stairs and the bottom of the stairs. If you do not have them, ask someone to add them for you. What else can I do to help prevent falls?  Wear shoes that:  Do not have high  heels.  Have rubber bottoms.  Are comfortable and fit you well.  Are closed at the toe. Do not wear sandals.  If you use a stepladder:  Make sure that it is fully opened. Do not climb a closed stepladder.  Make sure that both sides of the stepladder are locked into place.  Ask someone to hold it for you, if possible.  Clearly mark and make sure that you can see:  Any grab bars or handrails.  First and last steps.  Where the edge of each step is.  Use tools that help you move around (mobility aids) if they are needed. These include:  Canes.  Walkers.  Scooters.  Crutches.  Turn on the lights when you go into a dark area. Replace any light bulbs as soon as they burn out.  Set up your furniture so you have a clear path. Avoid moving your furniture around.  If any of your floors are uneven, fix them.  If there are any pets around you, be aware of where they are.  Review your medicines with your doctor. Some medicines can make you feel dizzy. This can increase your chance of falling. Ask your doctor what other things that you can do to help prevent falls. This information is not intended to replace advice given to you by your health care provider. Make sure you discuss any questions you have with your health care provider. Document Released: 04/20/2009 Document Revised: 11/30/2015 Document Reviewed: 07/29/2014 Elsevier Interactive Patient Education  2017 [employer].

## 2019-07-30 NOTE — Progress Notes (Signed)
Subjective:   Jacqueline [name] is a 69 y.o. female who presents for Medicare Annual (Subsequent) preventive examination.  Virtual Visit via Telephone Note  I connected with Jacqueline [name] [name] on 07/30/19 at 10:00 AM EST by telephone and verified that I am speaking with the correct person using two identifiers.  Medicare Annual Wellness visit completed telephonically due to Covid-19 pandemic.   Location: Patient: home Provider: office   I discussed the limitations, risks, security and privacy concerns of performing an evaluation and management service by telephone and the availability of in person appointments. The patient expressed understanding and agreed to proceed.  Some vital signs may be absent or patient reported.   Clemetine Marker, LPN    Review of Systems:   Cardiac Risk Factors include: advanced age (>27mn, >>2women);diabetes mellitus;dyslipidemia;hypertension;obesity (BMI >30kg/m2)     Objective:     Vitals: BP 126/74   Temp 98.2 F (36.8 C)   Ht 5' 4"  (1.626 m)   Wt 187 lb (84.8 kg)   BMI 32.10 kg/m   Body mass index is 32.1 kg/m.  Advanced Directives 07/30/2019 06/14/2019  Does Patient Have a Medical Advance Directive? No No  Would patient like information on creating a medical advance directive? Yes (MAU/Ambulatory/Procedural Areas - Information given) -    Tobacco Social History   Tobacco Use  Smoking Status Former Smoker  . Packs/day: 0.25  . Years: 30.00  . Pack years: 7.50  . Types: Cigarettes  . Start date: 07/09/1983  . Quit date: 05/27/2014  . Years since quitting: 5.1  Smokeless Tobacco Never Used     Counseling given: Not Answered   Clinical Intake:  Pre-visit preparation completed: Yes  Pain : No/denies pain     BMI - recorded: 32.1 Nutritional Status: BMI > 30  Obese Nutritional Risks: None Diabetes: Yes CBG done?: No Did pt. bring in CBG monitor from home?: No   Nutrition Risk Assessment:  Has the patient had any  N/V/D within the last 2 months?  No  Does the patient have any non-healing wounds?  No  Has the patient had any unintentional weight loss or weight gain?  No   Diabetes:  Is the patient diabetic?  Yes  If diabetic, was a CBG obtained today?  No  Did the patient bring in their glucometer from home?  No  How often do you monitor your CBG's? Twice a week.   Financial Strains and Diabetes Management:  Are you having any financial strains with the device, your supplies or your medication? No .  Does the patient want to be seen by Chronic Care Management for management of their diabetes?  No  Would the patient like to be referred to a Nutritionist or for Diabetic Management?  No   Diabetic Exams:  Diabetic Eye Exam: [facility] 07[facility]29[facility]19 [facility] patient[facility]   Diabetic Foot Exam: Completed 06/25/19.   How often do you need to have someone help you when you read instructions, pamphlets, or other written materials from your doctor or pharmacy?: 1 - Never  Interpreter Needed?: No  Information entered by :: KClemetine MarkerLPN  Past Medical History:  Diagnosis Date  . Calcium oxalate renal stones   . Hyperlipidemia   . Hypertension   . MVP (mitral valve prolapse)    Past Surgical History:  Procedure Laterality Date  . BREAST BIOPSY Right 08/19/2012   neg, Dr. BBary Castilla . extraction of renal stones    .  TONSILLECTOMY AND ADENOIDECTOMY    . VAGINAL HYSTERECTOMY  1980   class IV, ovaries intact   Family History  Problem Relation Age of Onset  . Hypertension Mother   . Dementia Mother   . Stroke Mother   . Cancer - Other Father        Bile Duct  . Heart disease Maternal Grandmother   . Heart disease Maternal Grandfather   . Hypertension Paternal Grandmother   . Cancer Maternal Aunt        breast and lung  . Stroke Paternal Grandfather    Social History   Socioeconomic History  . Marital status: Married    Spouse name: Richardson Landry  . Number of  children: 2  . Years of education: Not on file  . Highest education level: Some college, no degree  Occupational History  . Occupation: Naval architect  Tobacco Use  . Smoking status: Former Smoker    Packs/day: 0.25    Years: 30.00    Pack years: 7.50    Types: Cigarettes    Start date: 07/09/1983    Quit date: 05/27/2014    Years since quitting: 5.1  . Smokeless tobacco: Never Used  Substance and Sexual Activity  . Alcohol use: Yes    Alcohol/week: 1.0 standard drinks    Types: 1 Glasses of wine per week    Comment: occasional  . Drug use: No  . Sexual activity: Yes    Partners: Male  Other Topics Concern  . Not on file  Social History Narrative  . Not on file   Social Determinants of Health   Financial Resource Strain: Low Risk   . Difficulty of Paying Living Expenses: Not hard at all  Food Insecurity: No Food Insecurity  . Worried About Charity fundraiser in the Last Year: Never true  . Ran Out of Food in the Last Year: Never true  Transportation Needs: No Transportation Needs  . Lack of Transportation (Medical): No  . Lack of Transportation (Non-Medical): No  Physical Activity: Inactive  . Days of Exercise per Week: 0 days  . Minutes of Exercise per Session: 0 min  Stress: No Stress Concern Present  . Feeling of Stress : Only a little  Social Connections: Not Isolated  . Frequency of Communication with Friends and Family: More than three times a week  . Frequency of Social Gatherings with Friends and Family: More than three times a week  . Attends Religious Services: More than 4 times per year  . Active Member of Clubs or Organizations: Yes  . Attends Archivist Meetings: More than 4 times per year  . Marital Status: Married    Outpatient Encounter Medications as of 07/30/2019  Medication Sig  . blood glucose meter kit and supplies KIT Lifescan meter Check blood sugar 2 times daily as needed  . calcium carbonate (CALCIUM 600) 600 MG TABS tablet  Take 1 tablet (600 mg total) by mouth 2 (two) times daily with a meal.  . Cholecalciferol (VITAMIN D) 50 MCG (2000 UT) CAPS Take by mouth.  . Lancets (ONETOUCH DELICA PLUS [id number]) MISC USE AS DIRECTED TWICE A DAY AS NEEDED  . lisinopril (ZESTRIL) 10 MG tablet Take 1 tablet (10 mg total) by mouth daily.  . metoprolol tartrate (LOPRESSOR) 25 MG tablet Take 1 tablet (25 mg total) by mouth 2 (two) times daily.  . Multiple Vitamin (MULTIVITAMIN) tablet Take 1 tablet by mouth daily.    Glory Rosebush ULTRA test strip USE  AS DIRECTED TWICE A DAY AS NEEDED  . OVER THE COUNTER MEDICATION Take 100 mg by mouth daily. Potassium  . OVER THE COUNTER MEDICATION Take 400 mg by mouth daily. Magnesium  . tiZANidine (ZANAFLEX) 2 MG tablet Take 1-2 tablets (2-4 mg total) by mouth every 8 (eight) hours as needed for muscle spasms.  . traZODone (DESYREL) 50 MG tablet TAKE 0.5-1 TABLETS (25-50 MG TOTAL) BY MOUTH AT BEDTIME.  . [DISCONTINUED] atorvastatin (LIPITOR) 10 MG tablet TAKE 1 TABLET (10 MG TOTAL) BY MOUTH DAILY. MUST SCHEDULE ANNUAL PHYSICAL (Patient not taking: Reported on 06/25/2019)  . [DISCONTINUED] atorvastatin (LIPITOR) 10 MG tablet TAKE 1 TABLET (10 MG TOTAL) BY MOUTH DAILY. MUST SCHEDULE ANNUAL PHYSICAL   No facility-administered encounter medications on file as of 07/30/2019.    Activities of Daily Living In your present state of health, do you have any difficulty performing the following activities: 07/30/2019 06/25/2019  Hearing? N N  Comment declines hearing aids -  Vision? N N  Difficulty concentrating or making decisions? N N  Walking or climbing stairs? N N  Dressing or bathing? N N  Doing errands, shopping? N N  Preparing Food and eating ? N -  Using the Toilet? N -  In the past six months, have you accidently leaked urine? N -  Do you have problems with loss of bowel control? N -  Managing your Medications? N -  Managing your Finances? N -  Housekeeping or managing your Housekeeping? N  -  Some recent data might be hidden    Patient Care Team: Delsa Grana, PA-C as PCP - General (Family Medicine) Bary Castilla Forest Gleason, MD (General Surgery)    Assessment:   This is a routine wellness examination for Ritta.  Exercise Activities and Dietary recommendations Current Exercise Habits: The patient does not participate in regular exercise at present, Exercise limited by: neurologic condition(s)  Goals   None     Fall Risk Fall Risk  07/30/2019 06/25/2019 05/28/2019 09/25/2017 07/10/2017  Falls in the past year? 1 1 1  No No  Number falls in past yr: 1 1 0 - -  Comment - - First October 2020 - -  Injury with Fall? 0 0 1 - -  Comment - - Contusion on her buttock - -  Risk for fall due to : History of fall(s) - Other (Comment) - -  Risk for fall due to: Comment - - Left leg locked up - -  Follow up Falls prevention discussed - - - -   FALL RISK PREVENTION PERTAINING TO THE HOME:  Any stairs in or around the home? Yes  If so, do they handrails? No  2 steps from garage  Home free of loose throw rugs in walkways, pet beds, electrical cords, etc? Yes  Adequate lighting in your home to reduce risk of falls? Yes   ASSISTIVE DEVICES UTILIZED TO PREVENT FALLS:  Life alert? No  Use of a cane, walker or w/c? No  Grab bars in the bathroom? No  Shower chair or bench in shower? Yes Elevated toilet seat or a handicapped toilet? Yes   DME ORDERS:  DME order needed?  No   TIMED UP AND GO:  Was the test performed? No . Telephonic visit.   Education: Fall risk prevention has been discussed.  Intervention(s) required? No    Depression Screen PHQ 2/9 Scores 07/30/2019 06/25/2019 05/28/2019 10/14/2018  PHQ - 2 Score 0 0 1 0  PHQ- 9 Score - 0 7 -  Cognitive Function     6CIT Screen 07/30/2019  What Year? 0 points  What month? 0 points  What time? 0 points  Count back from 20 0 points  Months in reverse 0 points  Repeat phrase 0 points  Total Score 0     Immunization History  Administered Date(s) Administered  . Influenza,inj,Quad PF,6+ Mos 07/30/2018  . Td 12/27/1997, 11/27/2009  . Zoster 10/27/2013    Qualifies for Shingles Vaccine? Yes  Zostavax completed 2015. Due for Shingrix. Education has been provided regarding the importance of this vaccine. Pt has been advised to call insurance company to determine out of pocket expense. Advised may also receive vaccine at local pharmacy or Health Dept. Verbalized acceptance and understanding.    Tdap: Up to date  Flu Vaccine: Due for Flu vaccine. Does the patient want to receive this vaccine today?  No . Education has been provided regarding the importance of this vaccine but still declined. Advised may receive this vaccine at local pharmacy or Health Dept. Aware to provide a copy of the vaccination record if obtained from local pharmacy or Health Dept. Verbalized acceptance and understanding.  Pneumococcal Vaccine: Due for Pneumococcal vaccine. Does the patient want to receive this vaccine today?  No . Education has been provided regarding the importance of this vaccine but still declined. Advised may receive this vaccine at local pharmacy or Health Dept. Aware to provide a copy of the vaccination record if obtained from local pharmacy or Health Dept. Verbalized acceptance and understanding.   Screening Tests Health Maintenance  Topic Date Due  . INFLUENZA VACCINE  10/06/2019 (Originally 02/06/2019)  . PNA vac Low Risk Adult (1 of 2 - PCV13) 06/24/2020 (Originally 07/28/2016)  . TETANUS/TDAP  11/28/2019  . HEMOGLOBIN A1C  12/24/2019  . FOOT EXAM  06/24/2020  . OPHTHALMOLOGY EXAM  07/28/2020  . MAMMOGRAM  12/28/2020  . COLONOSCOPY  08/14/2022  . DEXA SCAN  Completed  . Hepatitis C Screening  Completed    Cancer Screenings:  Colorectal Screening: Completed 08/14/12. Repeat every 10 years;   Mammogram: Completed 12/29/18. Repeat every year.  Bone Density: Completed 12/22/18. Results  reflect OSTEOPENIA. Repeat every 2 years.   Lung Cancer Screening: (Low Dose CT Chest recommended if Age 78-80 years, 30 pack-year currently smoking OR have quit w/in 15years.) does not qualify.    Additional Screening:  Hepatitis C Screening: does qualify; Completed 04/05/19.   Vision Screening: Recommended annual ophthalmology exams for early detection of glaucoma and other disorders of the eye. Is the patient up to date with their annual eye exam?  Yes  Who is the provider or what is the name of the office in which the pt attends annual eye exams? [place] Screening: Recommended annual dental exams for proper oral hygiene  Community Resource Referral:  CRR required this visit?  No      Plan:     I have personally reviewed and addressed the Medicare Annual Wellness questionnaire and have noted the following in the patient's chart:  A. Medical and social history B. Use of alcohol, tobacco or illicit drugs  C. Current medications and supplements D. Functional ability and status E.  Nutritional status F.  Physical activity G. Advance directives H. List of other physicians I.  Hospitalizations, surgeries, and ER visits in previous 12 months J.  Temperance such as hearing and vision if needed, cognitive and depression L. Referrals and appointments   In addition, I have reviewed and  discussed with patient certain preventive protocols, quality metrics, and best practice recommendations. A written personalized care plan for preventive services as well as general preventive health recommendations were provided to patient.   Signed,  Clemetine Marker, LPN Nurse Health Advisor   Nurse Notes: none

## 2019-08-03 DIAGNOSIS — E559 Vitamin D deficiency, unspecified: Secondary | ICD-10-CM | POA: Diagnosis not present

## 2019-08-03 DIAGNOSIS — E538 Deficiency of other specified B group vitamins: Secondary | ICD-10-CM | POA: Diagnosis not present

## 2019-08-03 DIAGNOSIS — R252 Cramp and spasm: Secondary | ICD-10-CM | POA: Diagnosis not present

## 2019-08-07 ENCOUNTER — Other Ambulatory Visit: Payer: Self-pay | Admitting: Internal Medicine

## 2019-08-09 ENCOUNTER — Other Ambulatory Visit: Payer: Self-pay | Admitting: Internal Medicine

## 2019-08-09 ENCOUNTER — Other Ambulatory Visit: Payer: Self-pay

## 2019-08-10 ENCOUNTER — Telehealth: Payer: Self-pay | Admitting: Cardiovascular Disease

## 2019-08-10 DIAGNOSIS — E785 Hyperlipidemia, unspecified: Secondary | ICD-10-CM

## 2019-08-10 NOTE — Telephone Encounter (Signed)
Please call to schedule calcium score test.

## 2019-08-10 NOTE — Telephone Encounter (Signed)
Spoke with patient and she is ready to schedule testing. Test ordered and sent her information to call for scheduling through My Chart. She verbalized understanding with no further questions at this time.

## 2019-08-11 ENCOUNTER — Encounter: Payer: Self-pay | Admitting: Family Medicine

## 2019-08-11 DIAGNOSIS — U071 COVID-19: Secondary | ICD-10-CM

## 2019-08-13 NOTE — Telephone Encounter (Signed)
Copied from [place] [id number]. Topic: General - Other >> Aug 13, 2019  1:42 PM Reyne Dumas L wrote: Reason for CRM:   Pt calling, states that she tested positive for COVID at [facility].  Was told to inform PCP.  Pt states she is having no symptoms (other than stopped up nose) at this time.

## 2019-08-21 ENCOUNTER — Other Ambulatory Visit: Payer: Self-pay | Admitting: Internal Medicine

## 2019-08-24 ENCOUNTER — Other Ambulatory Visit: Payer: Self-pay | Admitting: Family Medicine

## 2019-08-24 DIAGNOSIS — M4807 Spinal stenosis, lumbosacral region: Secondary | ICD-10-CM | POA: Diagnosis not present

## 2019-08-24 DIAGNOSIS — U071 COVID-19: Secondary | ICD-10-CM

## 2019-08-24 DIAGNOSIS — M79604 Pain in right leg: Secondary | ICD-10-CM | POA: Diagnosis not present

## 2019-08-24 DIAGNOSIS — E538 Deficiency of other specified B group vitamins: Secondary | ICD-10-CM | POA: Diagnosis not present

## 2019-08-24 DIAGNOSIS — M48062 Spinal stenosis, lumbar region with neurogenic claudication: Secondary | ICD-10-CM | POA: Diagnosis not present

## 2019-08-24 DIAGNOSIS — M79605 Pain in left leg: Secondary | ICD-10-CM | POA: Diagnosis not present

## 2019-08-25 ENCOUNTER — Ambulatory Visit (INDEPENDENT_AMBULATORY_CARE_PROVIDER_SITE_OTHER)
Admission: RE | Admit: 2019-08-25 | Discharge: 2019-08-25 | Disposition: A | Discharge status: Home / Self Care | Payer: Self-pay | Source: Ambulatory Visit | Attending: Cardiovascular Disease | Admitting: Cardiovascular Disease

## 2019-08-25 ENCOUNTER — Other Ambulatory Visit: Payer: Self-pay

## 2019-08-25 DIAGNOSIS — E785 Hyperlipidemia, unspecified: Secondary | ICD-10-CM

## 2019-09-01 ENCOUNTER — Telehealth: Payer: Self-pay | Admitting: *Deleted

## 2019-09-01 NOTE — Telephone Encounter (Signed)
-----   Message from Minna Merritts, MD sent at 08/29/2019  9:20 PM EST ----- Calcium score very elevated 1200 Need to get cholesterol down further Goal DL <70 Would increase atorvastatin up to 40 daily, add zetia 10 mg daily

## 2019-09-01 NOTE — Telephone Encounter (Signed)
Left voicemail message to call back for results.  

## 2019-09-02 MED ORDER — EZETIMIBE 10 MG PO TABS: 10.0000 mg | ORAL_TABLET | Freq: Every day | ORAL | 3 refills | Status: DC

## 2019-09-02 NOTE — Telephone Encounter (Signed)
Spoke with patient and reviewed results and recommendations. She was on atorvastatin and was having terrible leg cramps and had to stop that. Advised that I would send in the Zetia to her pharmacy and would check with provider about another option due to Lipitor intolerance and would be in touch with his recommendations. She was appreciative for the call with no further questions at this time.

## 2019-09-02 NOTE — Telephone Encounter (Signed)
Patient calling back in, can be reached at [phone] when able

## 2019-09-07 ENCOUNTER — Other Ambulatory Visit: Payer: Self-pay | Admitting: Family Medicine

## 2019-09-08 ENCOUNTER — Other Ambulatory Visit: Payer: Self-pay | Admitting: Internal Medicine

## 2019-09-08 ENCOUNTER — Other Ambulatory Visit (HOSPITAL_COMMUNITY): Payer: Self-pay | Admitting: Internal Medicine

## 2019-09-08 DIAGNOSIS — M79605 Pain in left leg: Secondary | ICD-10-CM

## 2019-09-08 DIAGNOSIS — M48062 Spinal stenosis, lumbar region with neurogenic claudication: Secondary | ICD-10-CM

## 2019-09-08 DIAGNOSIS — M79604 Pain in right leg: Secondary | ICD-10-CM

## 2019-09-12 NOTE — Telephone Encounter (Signed)
We will try different cholesterol medication and start Crestor 10 daily  with Zetia We may need to titrate up later

## 2019-09-13 NOTE — Telephone Encounter (Signed)
Left voicemail message to call back for recommendations from provider.

## 2019-09-14 MED ORDER — ROSUVASTATIN CALCIUM 10 MG PO TABS: 10.0000 mg | ORAL_TABLET | Freq: Every day | ORAL | 3 refills | Status: DC

## 2019-09-14 NOTE — Addendum Note (Signed)
Addended by: Valora Corporal on: 09/14/2019 08:42 AM   Modules accepted: Orders

## 2019-09-14 NOTE — Telephone Encounter (Signed)
Spoke with patient and reviewed provider recommendations to try crestor as well as the zetia. Sent in prescription for her and reviewed that without insurance using [facility] it is $17.79 for 90 day supply. She was agreeable to try the crestor and was appreciative for the additional information on cost savings. Instructed her to please call us back if her symptoms return and we will look at other options. She verbalized understanding with no further questions at this time.

## 2019-09-18 ENCOUNTER — Other Ambulatory Visit: Payer: Self-pay

## 2019-09-18 ENCOUNTER — Ambulatory Visit
Admission: RE | Admit: 2019-09-18 | Discharge: 2019-09-18 | Disposition: A | Discharge status: Home / Self Care | Payer: Medicare HMO | Source: Ambulatory Visit | Attending: Internal Medicine | Admitting: Internal Medicine

## 2019-09-18 DIAGNOSIS — M48062 Spinal stenosis, lumbar region with neurogenic claudication: Secondary | ICD-10-CM | POA: Diagnosis not present

## 2019-09-18 DIAGNOSIS — M79605 Pain in left leg: Secondary | ICD-10-CM | POA: Diagnosis not present

## 2019-09-18 DIAGNOSIS — M79604 Pain in right leg: Secondary | ICD-10-CM | POA: Diagnosis not present

## 2019-09-18 DIAGNOSIS — M545 Low back pain: Secondary | ICD-10-CM | POA: Diagnosis not present

## 2019-09-21 DIAGNOSIS — R252 Cramp and spasm: Secondary | ICD-10-CM | POA: Diagnosis not present

## 2019-09-21 DIAGNOSIS — E538 Deficiency of other specified B group vitamins: Secondary | ICD-10-CM | POA: Diagnosis not present

## 2019-09-21 DIAGNOSIS — M48062 Spinal stenosis, lumbar region with neurogenic claudication: Secondary | ICD-10-CM | POA: Diagnosis not present

## 2019-09-23 ENCOUNTER — Encounter: Payer: Self-pay | Admitting: Family Medicine

## 2019-09-23 ENCOUNTER — Other Ambulatory Visit: Payer: Self-pay

## 2019-09-23 ENCOUNTER — Ambulatory Visit (INDEPENDENT_AMBULATORY_CARE_PROVIDER_SITE_OTHER): Payer: Medicare HMO | Admitting: Family Medicine

## 2019-09-23 VITALS — BP 122/70 | HR 77 | Temp 98.3°F | Resp 14 | Ht 64.0 in | Wt 193.1 lb

## 2019-09-23 DIAGNOSIS — M791 Myalgia, unspecified site: Secondary | ICD-10-CM | POA: Diagnosis not present

## 2019-09-23 DIAGNOSIS — I700 Atherosclerosis of aorta: Secondary | ICD-10-CM

## 2019-09-23 DIAGNOSIS — R7989 Other specified abnormal findings of blood chemistry: Secondary | ICD-10-CM

## 2019-09-23 DIAGNOSIS — E538 Deficiency of other specified B group vitamins: Secondary | ICD-10-CM | POA: Diagnosis not present

## 2019-09-23 DIAGNOSIS — E119 Type 2 diabetes mellitus without complications: Secondary | ICD-10-CM | POA: Diagnosis not present

## 2019-09-23 DIAGNOSIS — N179 Acute kidney failure, unspecified: Secondary | ICD-10-CM | POA: Diagnosis not present

## 2019-09-23 DIAGNOSIS — E785 Hyperlipidemia, unspecified: Secondary | ICD-10-CM

## 2019-09-23 DIAGNOSIS — N1831 Chronic kidney disease, stage 3a: Secondary | ICD-10-CM | POA: Diagnosis not present

## 2019-09-23 DIAGNOSIS — I10 Essential (primary) hypertension: Secondary | ICD-10-CM | POA: Diagnosis not present

## 2019-09-23 NOTE — Progress Notes (Signed)
Name: Jacqueline [name] [facility]   MRN: [id number]    DOB: 11/02/1950   Date:09/29/2019       Progress Note  Chief Complaint  Patient presents with  . Follow-up  . Diabetes  . Hypertension  . Hyperlipidemia     Subjective:   Jacqueline [name] is a 69 y.o. female, presents to clinic for routine follow up on the conditions listed above.  Pt presents for f/up on routine conditions She has consulted with rheumatology at [facility] clinic, dx of B12 deficiency 08/03/2019 B0-12 level was 221 Her leg pain is now managed with gabapentin 100 mg TID, she is helping she was told by rheumatology that it would take several months for the B12 injections to help with her leg symptoms.  She is not having any adverse side effects or concerns of either treatment.  She also noted that rheumatology said there is nothing else for them to do for her but she does have a follow-up next month.  He has been referred to neurology for further evaluation of her legs have tried to review the records to see exactly what the referral was for the referral is put in his myalgias is unclear if she is been referred for further evaluation of lumbar spine with radicular component of her myalgias or if she needs an EMG? Jacqueline [name] is a 69 y.o. female who presents to [facility] for follow up last office visit, August 03, 2019.  Today, persistent leg cramps, described as sharp, dull ache, random, always below the kneeS, popliteal area, calf, distal ankle. ResolveS with massage, she spread gabapentin as 100 mg 3 times a day, helpS a little bit but does not know why she still having those symptoms. Vascular studies including ABI were normal with no stigmata of peripheral arterial disease, recent MRI lumbar spine showED lumbar stenosis with no neural impingement. Patient denies any lower back pain with radiculopathy no leg weakness no gait abnormalities. Mri LUMBAR SPINE 09/19/2019. IMPRESSION: 1. Transitional anatomy with  the lowest partially segmented vertebra numbered S1 based on the lowest ribs. 2. Generalized disc narrowing and bulging. Advanced lower lumbar facet arthropathy with L4-5 anterolisthesis. 3. L5-S1 spinal stenosis without neural compression. There is diffuse patency of the foramina. LABS 08/03/2019 Low Serum B12, Creatinine 1.2 GFR 45, mild ALT elevation 47 normal below 38. Normal AST. Normal serum vitamin D. Normal serum CPK. Normal serum phosphorus magnesium, normal iron profile.   Last labs with [facility] clinic show continued decreased renal function, 08/03/2019 GFR 45, sCr 1.2 -she is continue to push fluids and have well-controlled blood pressures   Diabetes Mellitus Type II: Currently managing with lifestyle changes, diet/exercise and monitoring CBGs Fasting CBGs typically run 90-120 Recent high CBG 123  No hypoglycemic episodes Denies: Polyuria, polydipsia, polyphagia, vision changes - she has myalgias and some neuropathy seeing neuro and rheumatology Recent pertinent labs: Lab Results  Component Value Date   HGBA1C 5.7 (H) 09/23/2019   HGBA1C 5.8 (H) 06/25/2019   HGBA1C 6.6 (H) 03/26/2019   Pt is up to date DM foot exam and eye exam ACEI/ARB:  yes Statin: Yes  Hyperlipidemia: Current Medication Regimen:  Crestor 10 mg and zetia mg Last Lipids: Total cholesterol 182, HDL low at 37, LDL high at 117 - Denies: Chest pain, shortness of breath -no myalgias other than her leg pain which is unchanged. - Documented aortic atherosclerosis? Yes - Risk factors for atherosclerosis: diabetes mellitus, hypercholesterolemia and hypertension  Hypertension:  Currently managed on metoprolol 25  BID and lisinopril 10 Pt reports good med compliance and denies any SE.   No lightheadedness, hypotension, syncope. Blood pressure today is well controlled. BP Readings from Last 3 Encounters:  09/23/19 122/70  07/30/19 126/74  07/21/19 110/70   Pt denies CP, SOB, exertional sx, LE edema,  palpitation, Ha's, visual disturbances   Drinking a lot of fluids, eating a lot of fruits, avoid pasta bread sweats Shes watching diet   Myalgias - better with gabapentin and muscle relaxers  Referred to neurology  Next Rheumatology appt April - she will ask if she needs to remain with rheumatology or can do B12 here     Patient Active Problem List   Diagnosis Date Noted  . Low serum vitamin B12 09/23/2019  . Stage 3a chronic kidney disease 09/23/2019  . Myalgia 06/25/2019  . Muscle weakness 06/25/2019  . Controlled type 2 diabetes mellitus without complication, without long-term current use of insulin ([place]) 04/01/2019  . FUO (fever of unknown origin) 04/01/2019  . History of kidney stones 10/14/2018  . Insomnia 10/14/2018  . Essential hypertension 01/14/2008  . HLD (hyperlipidemia) 11/11/2006  . Osteopenia 11/11/2006    Past Surgical History:  Procedure Laterality Date  . BREAST BIOPSY Right 08/19/2012   neg, Dr. Bary Castilla  . extraction of renal stones    . TONSILLECTOMY AND ADENOIDECTOMY    . VAGINAL HYSTERECTOMY  1980   class IV, ovaries intact    Family History  Problem Relation Age of Onset  . Hypertension Mother   . Dementia Mother   . Stroke Mother   . Cancer - Other Father        Bile Duct  . Heart disease Maternal Grandmother   . Heart disease Maternal Grandfather   . Hypertension Paternal Grandmother   . Cancer Maternal Aunt        breast and lung  . Stroke Paternal Grandfather     Social History   Tobacco Use  . Smoking status: Former Smoker    Packs/day: 0.25    Years: 30.00    Pack years: 7.50    Types: Cigarettes    Start date: 07/09/1983    Quit date: 05/27/2014    Years since quitting: 5.3  . Smokeless tobacco: Never Used  Substance Use Topics  . Alcohol use: Yes    Alcohol/week: 1.0 standard drinks    Types: 1 Glasses of wine per week    Comment: occasional  . Drug use: No      Current Outpatient Medications:  .  calcium  carbonate (CALCIUM 600) 600 MG TABS tablet, Take 1 tablet (600 mg total) by mouth 2 (two) times daily with a meal., Disp: 30 tablet, Rfl: 0 .  Cholecalciferol (VITAMIN D) 50 MCG (2000 UT) CAPS, Take by mouth., Disp: 30 capsule, Rfl:  .  ezetimibe (ZETIA) 10 MG tablet, Take 1 tablet (10 mg total) by mouth daily., Disp: 90 tablet, Rfl: 3 .  gabapentin (NEURONTIN) 100 MG capsule, Take 100 mg by mouth 3 (three) times daily., Disp: , Rfl:  .  lisinopril (ZESTRIL) 10 MG tablet, Take 1 tablet (10 mg total) by mouth daily., Disp: 90 tablet, Rfl: 3 .  metoprolol tartrate (LOPRESSOR) 25 MG tablet, Take 1 tablet (25 mg total) by mouth 2 (two) times daily., Disp: 180 tablet, Rfl: 3 .  Multiple Vitamin (MULTIVITAMIN) tablet, Take 1 tablet by mouth daily.  , Disp: , Rfl:  .  OVER THE COUNTER MEDICATION, Take 100 mg by mouth daily. Potassium, Disp: ,  Rfl:  .  OVER THE COUNTER MEDICATION, Take 400 mg by mouth daily. Magnesium, Disp: , Rfl:  .  rosuvastatin (CRESTOR) 10 MG tablet, Take 1 tablet (10 mg total) by mouth daily., Disp: 90 tablet, Rfl: 3 .  tiZANidine (ZANAFLEX) 2 MG tablet, Take 1-2 tablets (2-4 mg total) by mouth every 8 (eight) hours as needed for muscle spasms., Disp: 90 tablet, Rfl: 2 .  traZODone (DESYREL) 50 MG tablet, Take 0.5-1 tablets (25-50 mg total) by mouth at bedtime as needed for sleep., Disp: 90 tablet, Rfl: 1 .  blood glucose meter kit and supplies KIT, Lifescan meter Check blood sugar 2 times daily as needed, Disp: 1 each, Rfl: 0 .  Lancets (ONETOUCH DELICA PLUS [id number]) MISC, USE AS DIRECTED TWICE A DAY AS NEEDED, Disp: 200 each, Rfl: 0 .  ONETOUCH ULTRA test strip, USE AS DIRECTED TWICE A DAY AS NEEDED, Disp: 200 strip, Rfl: 1  No Known Allergies  Chart Review Today: I personally reviewed active problem list, medication list, allergies, family history, social history, health maintenance, notes from last encounter, lab results, imaging with the patient/caregiver today. Significant  amount of time reviewing patient's records recent imaging and labs through care everywhere  Review of Systems  10 Systems reviewed and are negative for acute change except as noted in the HPI.  Objective:    Vitals:   09/23/19 1041  BP: 122/70  Pulse: 77  Resp: 14  Temp: 98.3 F (36.8 C)  SpO2: 98%  Weight: 193 lb 1.6 oz (87.6 kg)  Height: _0  (1.626 m)    Body mass index is 33.15 kg/m.  Physical Exam Vitals and nursing note reviewed.  Constitutional:      General: She is not in acute distress.    Appearance: Normal appearance. She is well-developed. She is not ill-appearing, toxic-appearing or diaphoretic.     Interventions: Face mask in place.  HENT:     Head: Normocephalic and atraumatic.     Right Ear: External ear normal.     Left Ear: External ear normal.  Eyes:     General: Lids are normal. No scleral icterus.       Right eye: No discharge.        Left eye: No discharge.     Conjunctiva/sclera: Conjunctivae normal.  Neck:     Trachea: Phonation normal. No tracheal deviation.  Cardiovascular:     Rate and Rhythm: Normal rate and regular rhythm.     Pulses: Normal pulses.          Radial pulses are 2+ on the right side and 2+ on the left side.       Posterior tibial pulses are 2+ on the right side and 2+ on the left side.     Heart sounds: Normal heart sounds. No murmur. No friction rub. No gallop.   Pulmonary:     Effort: Pulmonary effort is normal. No respiratory distress.     Breath sounds: Normal breath sounds. No stridor. No wheezing, rhonchi or rales.  Chest:     Chest wall: No tenderness.  Abdominal:     General: Bowel sounds are normal. There is no distension.     Palpations: Abdomen is soft.     Tenderness: There is no abdominal tenderness. There is no guarding or rebound.  Musculoskeletal:        General: No deformity. Normal range of motion.     Cervical back: Normal range of motion and neck supple.  Right lower leg: No edema.     Left  lower leg: No edema.  Lymphadenopathy:     Cervical: No cervical adenopathy.  Skin:    General: Skin is warm and dry.     Capillary Refill: Capillary refill takes less than 2 seconds.     Coloration: Skin is not jaundiced or pale.     Findings: No rash.  Neurological:     Mental Status: She is alert and oriented to person, place, and time.     Motor: No abnormal muscle tone.     Gait: Gait normal.  Psychiatric:        Speech: Speech normal.        Behavior: Behavior normal.        PHQ2/9: Depression screen [facility] 2/9 09/23/2019 07/30/2019 06/25/2019 05/28/2019 10/14/2018  Decreased Interest 0 0 0 1 0  Down, Depressed, Hopeless 0 0 0 0 0  PHQ - 2 Score 0 0 0 1 0  Altered sleeping 0 - 0 3 -  Tired, decreased energy 0 - 0 2 -  Change in appetite 0 - 0 0 -  Feeling bad or failure about yourself  0 - 0 0 -  Trouble concentrating 0 - 0 0 -  Moving slowly or fidgety/restless 0 - 0 1 -  Suicidal thoughts 0 - 0 0 -  PHQ-9 Score 0 - 0 7 -  Difficult doing work/chores Not difficult at all - Not difficult at all Somewhat difficult -    phq 9 is neg, reviewed  Fall Risk: Fall Risk  09/23/2019 07/30/2019 06/25/2019 05/28/2019 09/25/2017  Falls in the past year? _0 No  Number falls in past yr: _1 0 -  Comment - - - First October 2020 -  Injury with Fall? 0 0 0 1 -  Comment - - - Contusion on her buttock -  Risk for fall due to : - History of fall(s) - Other (Comment) -  Risk for fall due to: Comment - - - Left leg locked up -  Follow up - Falls prevention discussed - - -    Functional Status Survey: Is the patient deaf or have difficulty hearing?: No Does the patient have difficulty seeing, even when wearing glasses/contacts?: No Does the patient have difficulty concentrating, remembering, or making decisions?: No Does the patient have difficulty walking or climbing stairs?: No Does the patient have difficulty dressing or bathing?: No Does the patient have difficulty doing  errands alone such as visiting a doctor's office or shopping?: No   Assessment & Plan:     ICD-10-CM   1. Controlled type 2 diabetes mellitus without complication, without long-term current use of insulin (HCC)  F00.7 COMPLETE METABOLIC PANEL WITH GFR    Hemoglobin A1c   Stable, diet controlled after recent diagnosis she has made significant progress, will recheck A1c today  2. Low serum vitamin B12  E53.8 Vitamin B12   Initiated treatment by rheumatology patient is considering transferring B12 deficiency management here to our office she will let us know after her next f/up  3. Essential hypertension  H21 COMPLETE METABOLIC PANEL WITH GFR   Well-controlled, stable, checking labs continue current medications  4. Myalgia  M79.10    Referred to rheumatology who treated with B12 rheumatology referred to neurology will continue to review specialist notes EMG? on lumbar spine?  5. Hyperlipidemia, unspecified hyperlipidemia type  F75.8 COMPLETE METABOLIC PANEL WITH GFR    Lipid panel  Compliant with medications no concerns reviewed that she should have restarted statin and Zetia recheck labs  6. AKI (acute kidney injury) ([place])  P36.8 COMPLETE METABOLIC PANEL WITH GFR   Recheck renal function patient has continue to push fluids her blood pressure is well controlled and no other nephrotoxic agents  7. Stage 3a chronic kidney disease  Z99.23 COMPLETE METABOLIC PANEL WITH GFR   Continue monitoring  8. LFT elevation  C14.43 COMPLETE METABOLIC PANEL WITH GFR   recheck labs  9. Aortic atherosclerosis (HCC)  I70.0    On appropriate treatment monitoring     Return for 3 month CPE and 6 month f/up DM/HTN/HLD/CKD.   Delsa Grana, PA-C 09/29/19 4:14 PM

## 2019-09-24 ENCOUNTER — Encounter: Payer: Self-pay | Admitting: Family Medicine

## 2019-09-24 LAB — COMPLETE METABOLIC PANEL WITH GFR
AG Ratio: 1.4 (calc) (ref 1.0–2.5)
ALT: 26 U/L (ref 6–29)
AST: 20 U/L (ref 10–35)
Albumin: 3.9 g/dL (ref 3.6–5.1)
Alkaline phosphatase (APISO): 47 U/L (ref 37–153)
BUN: 15 mg/dL (ref 7–25)
CO2: 30 mmol/L (ref 20–32)
Calcium: 9.6 mg/dL (ref 8.6–10.4)
Chloride: 104 mmol/L (ref 98–110)
Creat: 0.96 mg/dL (ref 0.50–0.99)
GFR, Est African American: 70 mL/min/{1.73_m2} (ref >=60)
GFR, Est Non African American: 61 mL/min/{1.73_m2} (ref >=60)
Globulin: 2.7 g/dL (calc) (ref 1.9–3.7)
Glucose, Bld: 104 mg/dL — ABNORMAL HIGH (ref 65–99)
Potassium: 4.8 mmol/L (ref 3.5–5.3)
Sodium: 140 mmol/L (ref 135–146)
Total Bilirubin: 0.6 mg/dL (ref 0.2–1.2)
Total Protein: 6.6 g/dL (ref 6.1–8.1)

## 2019-09-24 LAB — LIPID PANEL
Cholesterol: 106 mg/dL (ref <200)
HDL: 37 mg/dL — ABNORMAL LOW (ref >=50)
LDL Cholesterol (Calc): 49 mg/dL (calc)
Non-HDL Cholesterol (Calc): 69 mg/dL (calc) (ref <130)
Total CHOL/HDL Ratio: 2.9 (calc) (ref <5.0)
Triglycerides: 113 mg/dL (ref <150)

## 2019-09-24 LAB — HEMOGLOBIN A1C
Hgb A1c MFr Bld: 5.7 % of total Hgb — ABNORMAL HIGH (ref <5.7)
Mean Plasma Glucose: 117 (calc)
eAG (mmol/L): 6.5 (calc)

## 2019-09-24 LAB — VITAMIN B12: Vitamin B-12: 1150 pg/mL — ABNORMAL HIGH (ref 200–1100)

## 2019-09-28 ENCOUNTER — Other Ambulatory Visit: Payer: Self-pay

## 2019-09-28 ENCOUNTER — Ambulatory Visit (INDEPENDENT_AMBULATORY_CARE_PROVIDER_SITE_OTHER): Payer: Medicare HMO | Admitting: Dermatology

## 2019-09-28 DIAGNOSIS — L814 Other melanin hyperpigmentation: Secondary | ICD-10-CM | POA: Diagnosis not present

## 2019-09-28 DIAGNOSIS — L821 Other seborrheic keratosis: Secondary | ICD-10-CM

## 2019-09-28 DIAGNOSIS — L918 Other hypertrophic disorders of the skin: Secondary | ICD-10-CM | POA: Diagnosis not present

## 2019-09-28 DIAGNOSIS — Z1283 Encounter for screening for malignant neoplasm of skin: Secondary | ICD-10-CM

## 2019-09-28 DIAGNOSIS — L578 Other skin changes due to chronic exposure to nonionizing radiation: Secondary | ICD-10-CM

## 2019-09-28 NOTE — Progress Notes (Signed)
   Follow-Up Visit   Subjective  Jacqueline [name] is a 69 y.o. female who presents for the following: Annual Exam (Patient advises nothing new or changing that she is aware of. ).  The following portions of the chart were reviewed this encounter and updated as appropriate:     Review of Systems: No other skin or systemic complaints.  Objective  Well appearing patient in no apparent distress; mood and affect are within normal limits.  A full examination was performed including scalp, head, eyes, ears, nose, lips, neck, chest, axillae, abdomen, back, buttocks, bilateral upper extremities, bilateral lower extremities, hands, feet, fingers, toes, fingernails, and toenails. All findings within normal limits unless otherwise noted below.  Objective  trunk, ext, face: Actinic changes   Objective  upper back, arms, chest: Scattered tan macules.   Objective  trunk, ext, neck, face: Stuck-on, waxy, tan-brown papules and plaques -- Discussed benign etiology and prognosis.    Left flank: 4 x 2.5 brown waxy plaque (vs epidermal nevus)  Objective  Upper abdomen: Fleshy, skin-colored pedunculated papules.    Assessment & Plan  Actinic skin damage trunk, ext, face  Recommend broad spectrum SPF 30 or greater.   Lentigines upper back, arms, chest  Benign, observe.    Seborrheic keratosis (2) trunk, ext, neck, face; Left flank  Benign, observe.    Skin cancer screening TBSE  Skin tag Upper abdomen  Benign, observe.    Return in about 1 year (around 09/27/2020) for TBSE.   Graciella Belton, RMA, am acting as scribe for [name], MD .

## 2019-09-28 NOTE — Patient Instructions (Signed)
Recommend broad spectrum SPF 30 or greater.

## 2019-10-06 DIAGNOSIS — E519 Thiamine deficiency, unspecified: Secondary | ICD-10-CM | POA: Diagnosis not present

## 2019-10-06 DIAGNOSIS — Q998 Other specified chromosome abnormalities: Secondary | ICD-10-CM | POA: Diagnosis not present

## 2019-10-06 DIAGNOSIS — E538 Deficiency of other specified B group vitamins: Secondary | ICD-10-CM | POA: Diagnosis not present

## 2019-10-06 DIAGNOSIS — R252 Cramp and spasm: Secondary | ICD-10-CM | POA: Diagnosis not present

## 2019-10-06 DIAGNOSIS — E038 Other specified hypothyroidism: Secondary | ICD-10-CM | POA: Diagnosis not present

## 2019-10-14 ENCOUNTER — Other Ambulatory Visit: Payer: Self-pay | Admitting: Family Medicine

## 2019-10-21 DIAGNOSIS — E538 Deficiency of other specified B group vitamins: Secondary | ICD-10-CM | POA: Diagnosis not present

## 2019-10-26 ENCOUNTER — Other Ambulatory Visit: Payer: Self-pay | Admitting: Family Medicine

## 2019-11-03 DIAGNOSIS — G4700 Insomnia, unspecified: Secondary | ICD-10-CM | POA: Diagnosis not present

## 2019-11-03 DIAGNOSIS — E1142 Type 2 diabetes mellitus with diabetic polyneuropathy: Secondary | ICD-10-CM | POA: Diagnosis not present

## 2019-11-03 DIAGNOSIS — Z87891 Personal history of nicotine dependence: Secondary | ICD-10-CM | POA: Diagnosis not present

## 2019-11-03 DIAGNOSIS — Z809 Family history of malignant neoplasm, unspecified: Secondary | ICD-10-CM | POA: Diagnosis not present

## 2019-11-03 DIAGNOSIS — E669 Obesity, unspecified: Secondary | ICD-10-CM | POA: Diagnosis not present

## 2019-11-03 DIAGNOSIS — G8929 Other chronic pain: Secondary | ICD-10-CM | POA: Diagnosis not present

## 2019-11-03 DIAGNOSIS — Z8249 Family history of ischemic heart disease and other diseases of the circulatory system: Secondary | ICD-10-CM | POA: Diagnosis not present

## 2019-11-03 DIAGNOSIS — E785 Hyperlipidemia, unspecified: Secondary | ICD-10-CM | POA: Diagnosis not present

## 2019-11-03 DIAGNOSIS — I10 Essential (primary) hypertension: Secondary | ICD-10-CM | POA: Diagnosis not present

## 2019-11-09 DIAGNOSIS — M79604 Pain in right leg: Secondary | ICD-10-CM | POA: Diagnosis not present

## 2019-11-09 DIAGNOSIS — M545 Low back pain: Secondary | ICD-10-CM | POA: Diagnosis not present

## 2019-11-09 DIAGNOSIS — M79605 Pain in left leg: Secondary | ICD-10-CM | POA: Diagnosis not present

## 2019-11-09 DIAGNOSIS — G8929 Other chronic pain: Secondary | ICD-10-CM | POA: Diagnosis not present

## 2019-11-23 DIAGNOSIS — G629 Polyneuropathy, unspecified: Secondary | ICD-10-CM

## 2019-11-23 DIAGNOSIS — E538 Deficiency of other specified B group vitamins: Secondary | ICD-10-CM | POA: Diagnosis not present

## 2019-11-23 DIAGNOSIS — M79605 Pain in left leg: Secondary | ICD-10-CM | POA: Diagnosis not present

## 2019-11-23 DIAGNOSIS — M79604 Pain in right leg: Secondary | ICD-10-CM | POA: Diagnosis not present

## 2019-11-23 DIAGNOSIS — R252 Cramp and spasm: Secondary | ICD-10-CM | POA: Diagnosis not present

## 2019-11-23 HISTORY — DX: Polyneuropathy, unspecified: G62.9

## 2019-12-21 DIAGNOSIS — E538 Deficiency of other specified B group vitamins: Secondary | ICD-10-CM | POA: Diagnosis not present

## 2019-12-28 ENCOUNTER — Ambulatory Visit (INDEPENDENT_AMBULATORY_CARE_PROVIDER_SITE_OTHER): Payer: Medicare HMO | Admitting: Family Medicine

## 2019-12-28 ENCOUNTER — Encounter: Payer: Self-pay | Admitting: Family Medicine

## 2019-12-28 ENCOUNTER — Other Ambulatory Visit: Payer: Self-pay

## 2019-12-28 VITALS — BP 118/76 | HR 75 | Temp 97.5°F | Resp 16 | Ht 64.0 in | Wt 206.0 lb

## 2019-12-28 DIAGNOSIS — Z1231 Encounter for screening mammogram for malignant neoplasm of breast: Secondary | ICD-10-CM | POA: Diagnosis not present

## 2019-12-28 DIAGNOSIS — Z6835 Body mass index (BMI) 35.0-35.9, adult: Secondary | ICD-10-CM | POA: Diagnosis not present

## 2019-12-28 DIAGNOSIS — R609 Edema, unspecified: Secondary | ICD-10-CM | POA: Insufficient documentation

## 2019-12-28 DIAGNOSIS — Z0000 Encounter for general adult medical examination without abnormal findings: Secondary | ICD-10-CM | POA: Diagnosis not present

## 2019-12-28 NOTE — Progress Notes (Signed)
Patient: Jacqueline [name] [facility], Female    DOB: June 24, 1951, 69 y.o.   MRN: [id number] Delsa Grana, PA-C Visit Date: 12/28/2019  Today's Provider: Delsa Grana, PA-C   Chief Complaint  Patient presents with  . Annual Exam   Subjective:   Annual physical exam:  Jacqueline [name] is a 69 y.o. female who presents today for complete physical exam:  Exercise/Activity: She is walking at work and walks the dog but no other formal exercise Diet/nutrition: Trying to eat a healthy diet Sleep: Fair  Pt wished to discuss acute complaints -lower extremity edema new and concerning Advised pt of separate visit billing/coding  She has seen neurology - did EMG and was diagnosed with peripheral neuropathy - gabapentin is working well to control nerve pain -she is currently taking 300 mg 3 times daily  From Neurology: Severe sensory polyneuropathy in both legs.  Gabapentin 300 mg TID   Peripheral edema - 30 min activity/walking, she walks with her job a lot and walks her dog Ankles, shoes tight, she wears her compression socks, she is elevating her legs and swelling is better in the morning or after wearing compression stockings.  She is careful to avoid any additional salt in her meals.  She did not have any swelling history in the past.  New medications started and adjusted the do seem to coincide with her edema is gabapentin.  She noted her lower extremity edema began around the same time they increase the dose from 100 mg capsules 3 times daily to 300 mg 3 times daily.  She denies associated palpitations, orthopnea, PND, exertional dyspnea, chest pain.   USPSTF grade A and B recommendations - reviewed and addressed today  Depression:  Phq 9 completed today by patient, was reviewed by me with patient in the room PHQ score is neg, pt feels good PHQ 2/9 Scores 12/28/2019 09/23/2019 07/30/2019 06/25/2019  PHQ - 2 Score 0 0 0 0  PHQ- 9 Score 0 0 - 0   Depression screen [facility] 2/9 12/28/2019  09/23/2019 07/30/2019 06/25/2019 05/28/2019  Decreased Interest 0 0 0 0 1  Down, Depressed, Hopeless 0 0 0 0 0  PHQ - 2 Score 0 0 0 0 1  Altered sleeping 0 0 - 0 3  Tired, decreased energy 0 0 - 0 2  Change in appetite 0 0 - 0 0  Feeling bad or failure about yourself  0 0 - 0 0  Trouble concentrating 0 0 - 0 0  Moving slowly or fidgety/restless 0 0 - 0 1  Suicidal thoughts 0 0 - 0 0  PHQ-9 Score 0 0 - 0 7  Difficult doing work/chores Not difficult at all Not difficult at all - Not difficult at all Somewhat difficult    Alcohol screening:   [facility] Visit from 12/28/2019 in [facility]  AUDIT-C Score 1      Immunizations and Health Maintenance: Health Maintenance  Topic Date Due  . [name]  11/28/2019  . COVID-19 Vaccine (1) 01/13/2020 (Originally 07/29/1962)  . PNA vac Low Risk Adult (1 of 2 - PCV13) 06/24/2020 (Originally 07/28/2016)  . INFLUENZA VACCINE  02/06/2020  . HEMOGLOBIN A1C  03/25/2020  . FOOT EXAM  06/24/2020  . OPHTHALMOLOGY EXAM  07/28/2020  . MAMMOGRAM  12/28/2020  . COLONOSCOPY  08/14/2022  . DEXA SCAN  Completed  . Hepatitis C Screening  Completed     Hep C Screening: done previously  STD testing and prevention (HIV/chl/gon/syphilis):  see above,  no additional testing desired by pt today- monogomous x 25 years, no testing needed/desired  Intimate partner violence:  safe  Sexual History/Pain during Intercourse: Married  Menstrual History/LMP/Abnormal Bleeding: none  No LMP recorded. Patient has had a hysterectomy.  Incontinence Symptoms: none  Breast cancer: due today Last Mammogram: see HM list above BRCA gene screening: none in the past  Cervical cancer screening: aged out Pt denies family hx of cancers - breast, ovarian, uterine, colon:     Osteoporosis:   Discussion on osteoporosis per age, including high calcium and vitamin D supplementation, weight bearing exercises Pt is supplementing with daily calcium/Vit D -    Skin cancer:  Hx of skin CA -  NO-goes to dermatology annually Discussed atypical lesions   Colorectal cancer:   Colonoscopy is up-to-date Discussed concerning signs and sx of CRC, pt denies melena, hematochezia, change in bowel movements pattern or caliber  Lung cancer:   Low Dose CT Chest recommended if Age 1-80 years, 30 pack-year currently smoking OR have quit w/in 15years. Patient does not qualify.    Social History   Tobacco Use  . Smoking status: Former Smoker    Packs/day: 0.25    Years: 30.00    Pack years: 7.50    Types: Cigarettes    Start date: 07/09/1983    Quit date: 05/27/2014    Years since quitting: 5.5  . Smokeless tobacco: Never Used  Vaping Use  . Vaping Use: Never used  Substance Use Topics  . Alcohol use: Yes    Alcohol/week: 1.0 standard drink    Types: 1 Glasses of wine per week    Comment: occasional  . Drug use: No       [facility] Visit from 12/28/2019 in [facility]  AUDIT-C Score 1      Family History  Problem Relation Age of Onset  . Hypertension Mother   . Dementia Mother   . Stroke Mother   . Cancer - Other Father        Bile Duct  . Heart disease Maternal Grandmother   . Heart disease Maternal Grandfather   . Hypertension Paternal Grandmother   . Cancer Maternal Aunt        breast and lung  . Stroke Paternal Grandfather      Blood pressure/Hypertension: BP Readings from Last 3 Encounters:  09/23/19 122/70  07/30/19 126/74  07/21/19 110/70    Weight/Obesity: Wt Readings from Last 3 Encounters:  12/28/19 206 lb (93.4 kg)  09/23/19 193 lb 1.6 oz (87.6 kg)  07/30/19 187 lb (84.8 kg)   BMI Readings from Last 3 Encounters:  12/28/19 35.36 kg/m  09/23/19 33.15 kg/m  07/30/19 32.10 kg/m     Lipids:  Lab Results  Component Value Date   CHOL 106 09/23/2019   CHOL 182 06/25/2019   CHOL 136 10/15/2018   Lab Results  Component Value Date   HDL 37 (L) 09/23/2019   HDL 37 (L) 06/25/2019   HDL  46.50 10/15/2018   Lab Results  Component Value Date   LDLCALC 49 09/23/2019   LDLCALC 117 (H) 06/25/2019   LDLCALC 78 10/15/2018   Lab Results  Component Value Date   TRIG 113 09/23/2019   TRIG 164 (H) 06/25/2019   TRIG 59.0 10/15/2018   Lab Results  Component Value Date   CHOLHDL 2.9 09/23/2019   CHOLHDL 4.9 06/25/2019   CHOLHDL 3 10/15/2018   Lab Results  Component Value Date   LDLDIRECT 149.5 12/25/2006  Based on the results of lipid panel his/her cardiovascular risk factor ( using [facility] )  in the next 10 years is: The ASCVD Risk score Mikey Bussing DC Jr., et al., 2013) failed to calculate for the following reasons:   The valid total cholesterol range is 130 to 320 mg/dL Glucose:  Glucose, Bld  Date Value Ref Range Status  09/23/2019 104 (H) 65 - 99 mg/dL Final    Comment:    .            Fasting reference interval . For someone without known diabetes, a glucose value between 100 and 125 mg/dL is consistent with prediabetes and should be confirmed with a follow-up test. .   06/25/2019 105 (H) 65 - 99 mg/dL Final    Comment:    .            Fasting reference interval . For someone without known diabetes, a glucose value between 100 and 125 mg/dL is consistent with prediabetes and should be confirmed with a follow-up test. .   06/14/2019 136 (H) 70 - 99 mg/dL Final   Hypertension: BP Readings from Last 3 Encounters:  09/23/19 122/70  07/30/19 126/74  07/21/19 110/70   Obesity: Wt Readings from Last 3 Encounters:  12/28/19 206 lb (93.4 kg)  09/23/19 193 lb 1.6 oz (87.6 kg)  07/30/19 187 lb (84.8 kg)   BMI Readings from Last 3 Encounters:  12/28/19 35.36 kg/m  09/23/19 33.15 kg/m  07/30/19 32.10 kg/m      Social History      She        Social History   Socioeconomic History  . Marital status: Married    Spouse name: Richardson Landry  . Number of children: 2  . Years of education: Not on file  . Highest education level: Some college, no  degree  Occupational History  . Occupation: Naval architect  Tobacco Use  . Smoking status: Former Smoker    Packs/day: 0.25    Years: 30.00    Pack years: 7.50    Types: Cigarettes    Start date: 07/09/1983    Quit date: 05/27/2014    Years since quitting: 5.5  . Smokeless tobacco: Never Used  Vaping Use  . Vaping Use: Never used  Substance and Sexual Activity  . Alcohol use: Yes    Alcohol/week: 1.0 standard drink    Types: 1 Glasses of wine per week    Comment: occasional  . Drug use: No  . Sexual activity: Yes    Partners: Male  Other Topics Concern  . Not on file  Social History Narrative  . Not on file   Social Determinants of Health   Financial Resource Strain: Low Risk   . Difficulty of Paying Living Expenses: Not hard at all  Food Insecurity: No Food Insecurity  . Worried About Charity fundraiser in the Last Year: Never true  . Ran Out of Food in the Last Year: Never true  Transportation Needs: No Transportation Needs  . Lack of Transportation (Medical): No  . Lack of Transportation (Non-Medical): No  Physical Activity: Inactive  . Days of Exercise per Week: 0 days  . Minutes of Exercise per Session: 0 min  Stress: No Stress Concern Present  . Feeling of Stress : Only a little  Social Connections: Socially Integrated  . Frequency of Communication with Friends and Family: More than three times a week  . Frequency of Social Gatherings with Friends and Family: More than  three times a week  . Attends Religious Services: More than 4 times per year  . Active Member of Clubs or Organizations: Yes  . Attends Archivist Meetings: More than 4 times per year  . Marital Status: Married    Family History        Family History  Problem Relation Age of Onset  . Hypertension Mother   . Dementia Mother   . Stroke Mother   . Cancer - Other Father        Bile Duct  . Heart disease Maternal Grandmother   . Heart disease Maternal Grandfather   .  Hypertension Paternal Grandmother   . Cancer Maternal Aunt        breast and lung  . Stroke Paternal Grandfather     Patient Active Problem List   Diagnosis Date Noted  . Low serum vitamin B12 09/23/2019  . Stage 3a chronic kidney disease 09/23/2019  . Myalgia 06/25/2019  . Muscle weakness 06/25/2019  . Controlled type 2 diabetes mellitus without complication, without long-term current use of insulin ([place]) 04/01/2019  . FUO (fever of unknown origin) 04/01/2019  . History of kidney stones 10/14/2018  . Insomnia 10/14/2018  . Essential hypertension 01/14/2008  . HLD (hyperlipidemia) 11/11/2006  . Osteopenia 11/11/2006    Past Surgical History:  Procedure Laterality Date  . BREAST BIOPSY Right 08/19/2012   neg, Dr. Bary Castilla  . extraction of renal stones    . TONSILLECTOMY AND ADENOIDECTOMY    . VAGINAL HYSTERECTOMY  1980   class IV, ovaries intact     Current Outpatient Medications:  .  calcium carbonate (CALCIUM 600) 600 MG TABS tablet, Take 1 tablet (600 mg total) by mouth 2 (two) times daily with a meal., Disp: 30 tablet, Rfl: 0 .  Cholecalciferol (VITAMIN D) 50 MCG (2000 UT) CAPS, Take by mouth., Disp: 30 capsule, Rfl:  .  gabapentin (NEURONTIN) 100 MG capsule, Take 100 mg by mouth 3 (three) times daily., Disp: , Rfl:  .  lisinopril (ZESTRIL) 10 MG tablet, Take 1 tablet (10 mg total) by mouth daily., Disp: 90 tablet, Rfl: 3 .  metoprolol tartrate (LOPRESSOR) 25 MG tablet, Take 1 tablet (25 mg total) by mouth 2 (two) times daily., Disp: 180 tablet, Rfl: 3 .  Multiple Vitamin (MULTIVITAMIN) tablet, Take 1 tablet by mouth daily.  , Disp: , Rfl:  .  tiZANidine (ZANAFLEX) 2 MG tablet, TAKE 1-2 TABLETS (2-4 MG TOTAL) BY MOUTH EVERY 8 (EIGHT) HOURS AS NEEDED FOR MUSCLE SPASMS., Disp: 90 tablet, Rfl: 2 .  traZODone (DESYREL) 50 MG tablet, Take 0.5-1 tablets (25-50 mg total) by mouth at bedtime as needed for sleep., Disp: 90 tablet, Rfl: 1 .  blood glucose meter kit and supplies KIT,  Lifescan meter Check blood sugar 2 times daily as needed (Patient not taking: Reported on 12/28/2019), Disp: 1 each, Rfl: 0 .  ezetimibe (ZETIA) 10 MG tablet, Take 1 tablet (10 mg total) by mouth daily., Disp: 90 tablet, Rfl: 3 .  Lancets (ONETOUCH DELICA PLUS [id number]) MISC, USE AS DIRECTED TWICE A DAY AS NEEDED (Patient not taking: Reported on 12/28/2019), Disp: 200 each, Rfl: 0 .  ONETOUCH ULTRA test strip, USE AS DIRECTED TWICE A DAY AS NEEDED (Patient not taking: Reported on 12/28/2019), Disp: 200 strip, Rfl: 1 .  OVER THE COUNTER MEDICATION, Take 100 mg by mouth daily. Potassium (Patient not taking: Reported on 12/28/2019), Disp: , Rfl:  .  OVER THE COUNTER MEDICATION, Take 400 mg by  mouth daily. Magnesium (Patient not taking: Reported on 12/28/2019), Disp: , Rfl:  .  rosuvastatin (CRESTOR) 10 MG tablet, Take 1 tablet (10 mg total) by mouth daily., Disp: 90 tablet, Rfl: 3  No Known Allergies  Patient Care Team: Delsa Grana, PA-C as PCP - General (Family Medicine) Bary Castilla Forest Gleason, MD (General Surgery)  Review of Systems  Constitutional: Negative.  Negative for activity change, appetite change, fatigue and unexpected weight change.  HENT: Negative.   Eyes: Negative.   Respiratory: Negative.  Negative for cough, choking, chest tightness and shortness of breath.   Cardiovascular: Positive for leg swelling. Negative for chest pain and palpitations.  Gastrointestinal: Negative.  Negative for abdominal pain and blood in stool.  Endocrine: Negative.   Genitourinary: Negative.   Musculoskeletal: Negative.  Negative for arthralgias, gait problem, joint swelling and myalgias.  Skin: Negative.  Negative for color change, pallor and rash.  Allergic/Immunologic: Negative.   Neurological: Negative.  Negative for syncope and weakness.  Hematological: Negative.   Psychiatric/Behavioral: Negative.  Negative for confusion, dysphoric mood, self-injury and suicidal ideas. The patient is not  nervous/anxious.      I personally reviewed active problem list, medication list, allergies, family history, social history, health maintenance, notes from last encounter, lab results, imaging with the patient/caregiver today. Reviewed multiple encounters with [facility] neurology, records and results reviewed through care everywhere today       Objective:   Vitals:  Vitals:   12/28/19 0930  Pulse: 75  Resp: 16  Temp: (!) 97.5 F (36.4 C)  TempSrc: Temporal  SpO2: 93%  Weight: 206 lb (93.4 kg)  Height: 5' 4" (1.626 m)    Body mass index is 35.36 kg/m.  Physical Exam Vitals and nursing note reviewed.  Constitutional:      General: She is not in acute distress.    Appearance: Normal appearance. She is well-developed. She is obese. She is not ill-appearing, toxic-appearing or diaphoretic.  HENT:     Head: Normocephalic and atraumatic.     Right Ear: External ear normal.     Left Ear: External ear normal.     Mouth/Throat:     Pharynx: Uvula midline.  Eyes:     General: Lids are normal. No scleral icterus.       Right eye: No discharge.        Left eye: No discharge.     Conjunctiva/sclera: Conjunctivae normal.     Pupils: Pupils are equal, round, and reactive to light.  Neck:     Trachea: Trachea and phonation normal. No tracheal deviation.  Cardiovascular:     Rate and Rhythm: Normal rate and regular rhythm.     Pulses: Normal pulses.          Radial pulses are 2+ on the right side and 2+ on the left side.       Posterior tibial pulses are 2+ on the right side and 2+ on the left side.     Heart sounds: Normal heart sounds. No murmur heard.  No friction rub. No gallop.   Pulmonary:     Effort: Pulmonary effort is normal. No respiratory distress.     Breath sounds: Normal breath sounds. No stridor. No wheezing, rhonchi or rales.  Chest:     Chest wall: No tenderness.     Breasts:        Right: Normal.        Left: Normal.  Abdominal:     General: Bowel  sounds are  normal. There is no distension.     Palpations: Abdomen is soft.     Tenderness: There is no abdominal tenderness. There is no guarding or rebound.  Genitourinary:    Comments: Declined pelvic/GU exam Musculoskeletal:     Cervical back: Normal range of motion and neck supple.     Right lower leg: Edema present.     Left lower leg: Edema present.  Lymphadenopathy:     Upper Body:     Right upper body: No supraclavicular, axillary or pectoral adenopathy.     Left upper body: No supraclavicular, axillary or pectoral adenopathy.  Skin:    General: Skin is warm and dry.     Capillary Refill: Capillary refill takes less than 2 seconds.     Coloration: Skin is not pale.     Findings: No rash.  Neurological:     Mental Status: She is alert and oriented to person, place, and time.     Motor: No abnormal muscle tone.     Gait: Gait normal.  Psychiatric:        Mood and Affect: Mood normal.        Speech: Speech normal.        Behavior: Behavior normal.       Fall Risk: Fall Risk  12/28/2019 09/23/2019 07/30/2019 06/25/2019 05/28/2019  Falls in the past year? 0 _0 Number falls in past yr: 0 _1 0  Comment - - - - First October 2020  Injury with Fall? 0 0 0 0 1  Comment - - - - Contusion on her buttock  Risk for fall due to : - - History of fall(s) - Other (Comment)  Risk for fall due to: Comment - - - - Left leg locked up  Follow up Falls evaluation completed - Falls prevention discussed - -    Functional Status Survey: Is the patient deaf or have difficulty hearing?: No Does the patient have difficulty seeing, even when wearing glasses/contacts?: No Does the patient have difficulty concentrating, remembering, or making decisions?: No Does the patient have difficulty walking or climbing stairs?: No Does the patient have difficulty dressing or bathing?: No Does the patient have difficulty doing errands alone such as visiting a doctor's office or shopping?:  No   Assessment & Plan:    CPE completed today  . USPSTF grade A and B recommendations reviewed with patient; age-appropriate recommendations, preventive care, screening tests, etc discussed and encouraged; healthy living encouraged; see AVS for patient education given to patient  . Discussed importance of 150 minutes of physical activity weekly, AHA exercise recommendations given to pt in AVS/handout  . Discussed importance of healthy diet:  eating lean meats and proteins, avoiding trans fats and saturated fats, avoid simple sugars and excessive carbs in diet, eat 6 servings of fruit/vegetables daily and drink plenty of water and avoid sweet beverages.    . Recommended pt to do annual eye exam and routine dental exams/cleanings  . Depression, alcohol, fall screening completed as documented above and per flowsheets  . Reviewed Health Maintenance: Health Maintenance  Topic Date Due  . TETANUS/TDAP  11/28/2019  . COVID-19 Vaccine (1) 01/13/2020 (Originally 07/29/1962)  . PNA vac Low Risk Adult (1 of 2 - PCV13) 06/24/2020 (Originally 07/28/2016)  . INFLUENZA VACCINE  02/06/2020  . HEMOGLOBIN A1C  03/25/2020  . FOOT EXAM  06/24/2020  . OPHTHALMOLOGY EXAM  07/28/2020  . MAMMOGRAM  12/28/2020  . COLONOSCOPY  08/14/2022  .  DEXA SCAN  Completed  . Hepatitis C Screening  Completed    . Immunizations: Immunization History  Administered Date(s) Administered  . Influenza,inj,Quad PF,6+ Mos 07/30/2018  . Td 12/27/1997, 11/27/2009  . Zoster 10/27/2013   Encouraged her to get her pneumococcal vaccines updated, Covid vaccines, and offered Shingrix to be sent to her pharmacy.  1. Adult general medical exam - MM 3D SCREEN BREAST BILATERAL; Future - COMPLETE METABOLIC PANEL WITH GFR - TSH - CBC with Differential/Platelet  2. Encounter for screening mammogram for malignant neoplasm of breast Exam done today, no concerning findings, mammogram ordered - MM 3D SCREEN BREAST BILATERAL;  Future   3. Class 2 severe obesity with serious comorbidity and body mass index (BMI) of 35.0 to 35.9 in adult, unspecified obesity type ([place]) with associated comorbidities - DM, HTN, HLD   4. Peripheral edema  Patient appears euvolemic, she has new and fairly moderate to severe ankle edema that is 1+ pitting, do see some mild pedal edema.  She does have some improvement with compression stockings which were prescribed to her by neurology, edema does improve when she elevates her legs.  She does not add salt but is not eating a low-salt diet, and she states that she could work on walking more and being more active and avoid sitting so much.  She does have some signs of venous insufficiency has some reticular veins to her medial foot and ankles bilaterally. Will screen some basic labs to check kidney function thyroid function, protein levels and liver function, she has no associated cardiac symptoms so I doubt edema to be due to CHF-but is in differential - COMPLETE METABOLIC PANEL WITH GFR - TSH - Microalbumin / creatinine urine ratio - CBC with Differential/Platelet  Plan for now is to work on low-salt diet, using compression stockings more often, increasing walking, elevate legs.  We did look up her medications it is possible that her gabapentin has caused her to develop peripheral edema she did note it was associated with her dose increase.  Gabapentin is so effective for her neuropathy that even if it continues to cause ankle edema she does not want to change her medication.  I offered referral to vascular specialist to further evaluate, and we did briefly discussed the use of as needed Lasix or diuretics?  Which I would like to avoid and first work on lifestyle and conservative treatment.  Patient agrees with plan  She will be due for follow-up for diabetes in her routine conditions in the next 3 to 6 months  Delsa Grana, Hershal Coria 12/28/19 9:48 AM  [place]

## 2019-12-28 NOTE — Patient Instructions (Addendum)
Peripheral Edema  Peripheral edema is swelling that is caused by a buildup of fluid. Peripheral edema most often affects the lower legs, ankles, and feet. It can also develop in the arms, hands, and face. The area of the body that has peripheral edema will look swollen. It may also feel heavy or warm. Your clothes may start to feel tight. Pressing on the area may make a temporary dent in your skin. You may not be able to move your swollen arm or leg as much as usual. There are many causes of peripheral edema. It can happen because of a complication of other conditions such as congestive heart failure, kidney disease, or a problem with your blood circulation. It also can be a side effect of certain medicines or because of an infection. It often happens to women during pregnancy. Sometimes, the cause is not known. Follow these instructions at home: Managing pain, stiffness, and swelling   Raise (elevate) your legs while you are sitting or lying down.  Move around often to prevent stiffness and to lessen swelling.  Do not sit or stand for long periods of time.  Wear support stockings as told by your health care provider. Medicines  Take over-the-counter and prescription medicines only as told by your health care provider.  Your health care provider may prescribe medicine to help your body get rid of excess water (diuretic). General instructions  Pay attention to any changes in your symptoms.  Follow instructions from your health care provider about limiting salt (sodium) in your diet. Sometimes, eating less salt may reduce swelling.  Moisturize skin daily to help prevent skin from cracking and draining.  Keep all follow-up visits as told by your health care provider. This is important. Contact a health care provider if you have:  A fever.  Edema that starts suddenly or is getting worse, especially if you are pregnant or have a medical condition.  Swelling in only one leg.  Increased  swelling, redness, or pain in one or both of your legs.  Drainage or sores at the area where you have edema. Get help right away if you:  Develop shortness of breath, especially when you are lying down.  Have pain in your chest or abdomen.  Feel weak.  Feel faint. Summary  Peripheral edema is swelling that is caused by a buildup of fluid. Peripheral edema most often affects the lower legs, ankles, and feet.  Move around often to prevent stiffness and to lessen swelling. Do not sit or stand for long periods of time.  Pay attention to any changes in your symptoms.  Contact a health care provider if you have edema that starts suddenly or is getting worse, especially if you are pregnant or have a medical condition.  Get help right away if you develop shortness of breath, especially when lying down. This information is not intended to replace advice given to you by your health care provider. Make sure you discuss any questions you have with your health care provider. Document Revised: 03/18/2018 Document Reviewed: 03/18/2018 Elsevier Patient Education  2020 Elsevier Inc.  

## 2019-12-29 LAB — COMPLETE METABOLIC PANEL WITH GFR
AG Ratio: 1.5 (calc) (ref 1.0–2.5)
ALT: 19 U/L (ref 6–29)
AST: 18 U/L (ref 10–35)
Albumin: 4 g/dL (ref 3.6–5.1)
Alkaline phosphatase (APISO): 49 U/L (ref 37–153)
BUN: 17 mg/dL (ref 7–25)
CO2: 29 mmol/L (ref 20–32)
Calcium: 9.1 mg/dL (ref 8.6–10.4)
Chloride: 106 mmol/L (ref 98–110)
Creat: 0.85 mg/dL (ref 0.50–0.99)
GFR, Est African American: 82 mL/min/{1.73_m2} (ref >=60)
GFR, Est Non African American: 70 mL/min/{1.73_m2} (ref >=60)
Globulin: 2.7 g/dL (calc) (ref 1.9–3.7)
Glucose, Bld: 81 mg/dL (ref 65–99)
Potassium: 4.6 mmol/L (ref 3.5–5.3)
Sodium: 141 mmol/L (ref 135–146)
Total Bilirubin: 0.5 mg/dL (ref 0.2–1.2)
Total Protein: 6.7 g/dL (ref 6.1–8.1)

## 2019-12-29 LAB — CBC WITH DIFFERENTIAL/PLATELET
Absolute Monocytes: 1092 cells/uL — ABNORMAL HIGH (ref 200–950)
Basophils Absolute: 84 cells/uL (ref 0–200)
Basophils Relative: 1 %
Eosinophils Absolute: 378 cells/uL (ref 15–500)
Eosinophils Relative: 4.5 %
HCT: 40.5 % (ref 35.0–45.0)
Hemoglobin: 13.7 g/dL (ref 11.7–15.5)
Lymphs Abs: 3142 cells/uL (ref 850–3900)
MCH: 31.7 pg (ref 27.0–33.0)
MCHC: 33.8 g/dL (ref 32.0–36.0)
MCV: 93.8 fL (ref 80.0–100.0)
MPV: 10 fL (ref 7.5–12.5)
Monocytes Relative: 13 %
Neutro Abs: 3704 cells/uL (ref 1500–7800)
Neutrophils Relative %: 44.1 %
Platelets: 217 10*3/uL (ref 140–400)
RBC: 4.32 10*6/uL (ref 3.80–5.10)
RDW: 12.8 % (ref 11.0–15.0)
Total Lymphocyte: 37.4 %
WBC: 8.4 10*3/uL (ref 3.8–10.8)

## 2019-12-29 LAB — MICROALBUMIN / CREATININE URINE RATIO
Creatinine, Urine: 79 mg/dL (ref 20–275)
Microalb, Ur: <0.2 mg/dL

## 2019-12-29 LAB — TSH: TSH: 1.63 mIU/L (ref 0.40–4.50)

## 2020-01-01 ENCOUNTER — Other Ambulatory Visit: Payer: Self-pay | Admitting: Family Medicine

## 2020-01-07 ENCOUNTER — Ambulatory Visit
Admission: RE | Admit: 2020-01-07 | Discharge: 2020-01-07 | Disposition: A | Discharge status: Home / Self Care | Payer: Medicare HMO | Source: Ambulatory Visit | Attending: Family Medicine | Admitting: Family Medicine

## 2020-01-07 DIAGNOSIS — Z0000 Encounter for general adult medical examination without abnormal findings: Secondary | ICD-10-CM

## 2020-01-07 DIAGNOSIS — Z1231 Encounter for screening mammogram for malignant neoplasm of breast: Secondary | ICD-10-CM | POA: Diagnosis not present

## 2020-01-18 DIAGNOSIS — E538 Deficiency of other specified B group vitamins: Secondary | ICD-10-CM | POA: Diagnosis not present

## 2020-02-01 DIAGNOSIS — E538 Deficiency of other specified B group vitamins: Secondary | ICD-10-CM | POA: Diagnosis not present

## 2020-03-07 ENCOUNTER — Telehealth: Payer: Self-pay | Admitting: *Deleted

## 2020-03-07 NOTE — Chronic Care Management (AMB) (Signed)
  Chronic Care Management   Note  03/07/2020 Name: Jacqueline [name] [facility] MRN: [id number] DOB: Jan 14, 1951  Jacqueline [name] [facility] is a 69 y.o. year old female who is a primary care patient of Delsa Grana, [place]. I reached out to [name] by phone today in response to a referral sent by [name]'s health plan.     [name] was given information about Chronic Care Management services today including:  1. CCM service includes personalized support from designated clinical staff supervised by her physician, including individualized plan of care and coordination with other care providers 2. 24/7 contact phone numbers for assistance for urgent and routine care needs. 3. Service will only be billed when office clinical staff spend 20 minutes or more in a month to coordinate care. 4. Only one practitioner may furnish and bill the service in a calendar month. 5. The patient may stop CCM services at any time (effective at the end of the month) by phone call to the office staff. 6. The patient will be responsible for cost sharing (co-pay) of up to 20% of the service fee (after annual deductible is met).  Patient agreed to services and verbal consent obtained.   Follow up plan: Telephone appointment with care management team member scheduled for: 03/16/2020  [facility]  [place], [place] [address] Direct Dial: [email or web address] Website: [email or web address]

## 2020-03-16 ENCOUNTER — Ambulatory Visit: Payer: Medicare HMO | Admitting: Pharmacist

## 2020-03-16 ENCOUNTER — Other Ambulatory Visit: Payer: Self-pay

## 2020-03-16 DIAGNOSIS — E785 Hyperlipidemia, unspecified: Secondary | ICD-10-CM

## 2020-03-16 DIAGNOSIS — I10 Essential (primary) hypertension: Secondary | ICD-10-CM

## 2020-03-16 NOTE — Chronic Care Management (AMB) (Signed)
Chronic Care Management Pharmacy  Name: Jacqueline [name] [facility]  MRN: [id number] DOB: [birth year]   Chief Complaint/ HPI  [name],  69 y.o. , female presents for their Initial CCM visit with the clinical pharmacist via telephone due to COVID-19 Pandemic.  PCP : Delsa Grana, PA-C  Their chronic conditions include: HTN, HLD, DM  Office Visits: 6/22 exam, BP 118/76 P 75 Wt 206 BMI 35.4, LE edema, peripheral neuropathy, class 2 obesity, not on low salt diet,   Consult Visit: 5/18 leg pain, [place], BP 97/61 P 65 Wt 191 32.8, leg cramping  Medications: Outpatient Encounter Medications as of 03/16/2020  Medication Sig  . blood glucose meter kit and supplies KIT Lifescan meter Check blood sugar 2 times daily as needed (Patient not taking: Reported on 12/28/2019)  . calcium carbonate (CALCIUM 600) 600 MG TABS tablet Take 1 tablet (600 mg total) by mouth 2 (two) times daily with a meal.  . Cholecalciferol (VITAMIN D) 50 MCG (2000 UT) CAPS Take by mouth.  . ezetimibe (ZETIA) 10 MG tablet Take 1 tablet (10 mg total) by mouth daily.  Marland Kitchen gabapentin (NEURONTIN) 100 MG capsule Take 100 mg by mouth 3 (three) times daily.  . Lancets (ONETOUCH DELICA PLUS [id number]) MISC USE AS DIRECTED TWICE A DAY AS NEEDED (Patient not taking: Reported on 12/28/2019)  . lisinopril (ZESTRIL) 10 MG tablet Take 1 tablet (10 mg total) by mouth daily.  . metoprolol tartrate (LOPRESSOR) 25 MG tablet Take 1 tablet (25 mg total) by mouth 2 (two) times daily.  . Multiple Vitamin (MULTIVITAMIN) tablet Take 1 tablet by mouth daily.    Glory Rosebush ULTRA test strip USE AS DIRECTED TWICE A DAY AS NEEDED (Patient not taking: Reported on 12/28/2019)  . OVER THE COUNTER MEDICATION Take 100 mg by mouth daily. Potassium (Patient not taking: Reported on 12/28/2019)  . OVER THE COUNTER MEDICATION Take 400 mg by mouth daily. Magnesium (Patient not taking: Reported on 12/28/2019)  . rosuvastatin (CRESTOR) 10 MG tablet Take 1 tablet (10  mg total) by mouth daily.  Marland Kitchen tiZANidine (ZANAFLEX) 2 MG tablet TAKE 1-2 TABLETS (2-4 MG TOTAL) BY MOUTH EVERY 8 (EIGHT) HOURS AS NEEDED FOR MUSCLE SPASMS.  Marland Kitchen traZODone (DESYREL) 50 MG tablet Take 0.5-1 tablets (25-50 mg total) by mouth at bedtime as needed for sleep.  . [DISCONTINUED] atorvastatin (LIPITOR) 10 MG tablet TAKE 1 TABLET (10 MG TOTAL) BY MOUTH DAILY. MUST SCHEDULE ANNUAL PHYSICAL (Patient not taking: Reported on 06/25/2019)   No facility-administered encounter medications on file as of 03/16/2020.      Financial Resource Strain: Low Risk   . Difficulty of Paying Living Expenses: Not hard at all    Current Diagnosis/Assessment:  Goals Addressed   None    Hypertension   BP goal is:  <130/80  Office blood pressures are  BP Readings from Last 3 Encounters:  12/28/19 118/76  09/23/19 122/70  07/30/19 126/74   Patient checks BP at home daily Patient home BP readings are ranging: 118/70  Patient has failed these meds in the past: NA Patient is currently controlled on the following medications:  . Lisinopril 66m daily . Metoprolol 250mbid  We discussed: At goal Denies hypotension Reading match office readings  Plan  Continue current medications   Diabetes   Recent Relevant Labs: Lab Results  Component Value Date/Time   HGBA1C 5.7 (H) 09/23/2019 10:47 AM   HGBA1C 5.8 (H) 06/25/2019 12:00 AM   MICROALBUR <0.2 12/28/2019 10:30 AM  MICROALBUR <0.2 06/25/2019 12:00 AM     Checking BG: Weekly  Recent pre-meal BG readings: 108 with cheat Patient is currently controlled on the following medications: None  Last diabetic Foot exam:  Lab Results  Component Value Date/Time   HMDIABEYEEXA No Retinopathy 07/29/2019 12:00 AM    Last diabetic Eye exam: No results found for: HMDIABFOOTEX   We discussed:  Excellent control Medication not needed  Plan  Continue current medications and control with diet and exercise   Hyperlipidemia   LDL goal <  70  Lipid Panel     Component Value Date/Time   CHOL 106 09/23/2019 1047   TRIG 113 09/23/2019 1047   HDL 37 (L) 09/23/2019 1047   LDLCALC 49 09/23/2019 1047   LDLDIRECT 149.5 12/25/2006 0851    Hepatic Function Latest Ref Rng & Units 12/28/2019 09/23/2019 06/25/2019  Total Protein 6.1 - 8.1 g/dL 6.7 6.6 6.9  Albumin 3.8 - 4.8 g/dL - - -  AST 10 - 35 U/L 18 20 15   ALT 6 - 29 U/L 19 26 16   Alk Phosphatase 39 - 117 IU/L - - -  Total Bilirubin 0.2 - 1.2 mg/dL 0.5 0.6 0.4  Bilirubin, Direct 0.0 - 0.3 mg/dL - - -     The ASCVD Risk score Mikey Bussing DC Jr., et al., 2013) failed to calculate for the following reasons:   The valid total cholesterol range is 130 to 320 mg/dL   Patient has failed these meds in past: Lipitor Patient is currently controlled on the following medications:  . Crestor 52m daily  We discussed:  At goal Denies myalgias  Plan  Continue current medications  Medication Management   [facility] Uses pill box? Yes Pt endorses 100% compliance Too many supplements stockpiled. Wants them in same place (Therapist, sports  We discussed:  Trazodone nightly (almost) Calcium 12079mdaily, counseled, will switch Mg 50054maily KCl 100m40mily, subtherapeutic, will stop Vitamin E 800 iu, no indication, will stop Covid-19 Vaccine  Plan  Get Covid-19 Vaccine Continue current medication management strategy  Follow up: 3 month phone visit  Eudora Guevarra Milus HeightarmD, BCGPWarnerTS[facility]-[phone]

## 2020-03-18 NOTE — Patient Instructions (Addendum)
Visit Information  Goals Addressed            This Visit's Progress   . Chronic Care Management       CARE PLAN ENTRY (see longitudinal plan of care for additional care plan information)  Current Barriers:  . Chronic Disease Management support, education, and care coordination needs related to Hypertension, Hyperlipidemia, and Diabetes   Hypertension BP Readings from Last 3 Encounters:  12/28/19 118/76  09/23/19 122/70  07/30/19 126/74   . Pharmacist Clinical Goal(s): o Over the next 90 days, patient will work with PharmD and providers to maintain BP goal <130/80 . Current regimen:  o Lisinopril 10mg  daily o Metoprolol 25mg  twice daily . Interventions: o None . Patient self care activities - Over the next 90 days, patient will: o Check BP weekly, document, and provide at future appointments o Ensure daily salt intake < 2300 mg/day  Hyperlipidemia Lab Results  Component Value Date/Time   LDLCALC 49 09/23/2019 10:47 AM   LDLDIRECT 149.5 12/25/2006 08:51 AM   . Pharmacist Clinical Goal(s): o Over the next 90 days, patient will work with PharmD and providers to maintain LDL goal < 70 . Current regimen:  o Rosuvastatin 10mg  daily . Interventions: o None . Patient self care activities - Over the next 90 days, patient will: o Continue current lifestyle modifications and practices  Diabetes Lab Results  Component Value Date/Time   HGBA1C 5.7 (H) 09/23/2019 10:47 AM   HGBA1C 5.8 (H) 06/25/2019 12:00 AM   . Pharmacist Clinical Goal(s): o Over the next 90 days, patient will work with PharmD and providers to maintain A1c goal <7% . Current regimen:  o None . Interventions: o None . Patient self care activities - Over the next 90 days, patient will: o Check blood sugar once daily, document, and provide at future appointments o Contact provider with any episodes of hypoglycemia  Medication management . Pharmacist Clinical Goal(s): o Over the next 90 days, patient  will work with PharmD and providers to maintain optimal medication adherence . [facility] . Interventions o Comprehensive medication review performed. o Continue current medication management strategy . Patient self care activities - Over the next 90 days, patient will: o Focus on medication adherence by continuing current practices o Take medications as prescribed o Report any questions or concerns to PharmD and/or provider(s)  Initial goal documentation        Jacqueline [name] was given information about Chronic Care Management services today including:  1. CCM service includes personalized support from designated clinical staff supervised by her physician, including individualized plan of care and coordination with other care providers 2. 24/7 contact phone numbers for assistance for urgent and routine care needs. 3. Standard insurance, coinsurance, copays and deductibles apply for chronic care management only during months in which we provide at least 20 minutes of these services. Most insurances cover these services at 100%, however patients may be responsible for any copay, coinsurance and/or deductible if applicable. This service may help you avoid the need for more expensive face-to-face services. 4. Only one practitioner may furnish and bill the service in a calendar month. 5. The patient may stop CCM services at any time (effective at the end of the month) by phone call to the office staff.  Patient agreed to services and verbal consent obtained.   Print copy of patient instructions provided.  Telephone follow up appointment with pharmacy team member scheduled for: 3 months  Milus Height, PharmD, BCGP, CTTS Clinical  Pharmacist [facility] [phone]  Familial Hypercholesterolemia Familial hypercholesterolemia (FH) is a genetic disorder that causes a very high level of LDL (low-density lipoprotein) cholesterol. Cholesterol is a waxy, fat-like substance  that your body needs to build cells. Your body makes all the cholesterol it needs in the liver and removes extra (excess) cholesterol from the blood as needed. Excess cholesterol comes from food that you eat. In people who have FH, the body is not able to remove LDL cholesterol from the blood as it should. A high level of LDL cholesterol puts you at higher risk for narrowing and hardening of your arteries (atherosclerosis) at an early age. This raises your risk for heart disease and stroke. What are the causes? FH is passed from parent to child (inherited). FH is caused by an inherited gene defect (genetic mutation) that makes it hard for the liver to remove LDL cholesterol from the blood. The gene may be inherited from one parent or both parents. What increases the risk? You may be at higher risk for FH if:  You have a family history of the condition. If both parents carry the genetic mutation, their children are at higher risk for a more severe form of FH, with symptoms that start at an earlier age. What are the signs or symptoms? You may have a high level of LDL cholesterol before you develop symptoms. Symptoms of FH may include:  Cholesterol nodules (xanthomas) on the cords of tissue that connect muscles to bones (tendons). Xanthomas often form on the long tendon at the back of the ankle (Achilles tendon) or on the tendons on the back of the hands.  Cholesterol deposits (xanthelasmas) under the skin of the eyelids.  A gray or blue ring around the white part of the eye (corneal arcus). Complications of FH can occur due to atherosclerosis. Atherosclerosis may cause damage to an area of the body that is not getting enough blood. Complications of FH may include:  Chest pain (angina) and shortness of breath due to narrowed or blocked arteries in the heart (coronary artery disease).  Pain and cramping in the back of the lower legs (calves) when walking (claudication).  Interruption in blood flow  to the brain (stroke). This may cause: ? Loss of balance. ? Vision loss. ? Sudden weakness or numbness on one side of the body. How is this diagnosed? This condition may be diagnosed based on:  Your symptoms.  Your medical history, including any family history of FH or early coronary heart disease.  A physical exam.  A blood test to check for the genetic mutation that causes FH. Your family members may also be tested. How is this treated? There is no cure for FH, but treatment can lower LDL cholesterol levels and lower your risk for heart attack or stroke. Treatment should be started as soon as you are diagnosed. Treatment may include:  A type of medicine that lowers your cholesterol (statin). If statins do not help, your health care provider may try other kinds of cholesterol-lowering medicines. The exact combination of medicines depends on the severity of your symptoms.  A procedure to filter LDL from your blood (apheresis). You may need this treatment if you have a severe form of FH.  Making lifestyle changes that are healthy for your heart, such as lowering the amount of fat and cholesterol in your diet. Follow these instructions at home: Lifestyle   Lose weight, if directed by your health care provider.  Follow instructions from your health  care provider about eating a healthy diet. Your health care provider may recommend: ? Working with a diet and nutrition specialist (dietitian), who can help you make a healthy eating plan and help you maintain a healthy weight. ? Eating less fat and cholesterol. Avoid fatty meats, fried foods, and whole-fat dairy. ? Eating more vegetables, fruits, and whole grains. ? Limiting your intake of alcohol.  Be physically active. Ask your health care provider what type of exercise is best for you.  Do not use any products that contain nicotine or tobacco, such as cigarettes and e-cigarettes. If you need help quitting, ask your health care  provider.  Work with your health care provider to manage any other conditions you have, such as high blood pressure (hypertension) or diabetes. These conditions affect your heart. General instructions  Take over-the-counter and prescription medicines only as told by your health care provider.  Keep all follow-up visits as told by your health care provider. This is important. Contact a health care provider if:  You have pain or cramps in your calf when you walk. Get help right away if:   You have sudden, unexplained discomfort in your chest, arms, back, neck, jaw, or upper body.  You have trouble breathing.  You have a sudden, severe headache with no known cause.  You have any symptoms of a stroke. "BE FAST" is an easy way to remember the main warning signs of a stroke: ? B - Balance. Signs are dizziness, sudden trouble walking, or loss of balance. ? E - Eyes. Signs are trouble seeing or a sudden change in vision. ? F - Face. Signs are sudden weakness or numbness of the face, or the face or eyelid drooping on one side. ? A - Arms. Signs are weakness or numbness in an arm. This happens suddenly and usually on one side of the body. ? S - Speech. Signs are sudden trouble speaking, slurred speech, or trouble understanding what people say. ? T - Time. Time to call emergency services. Write down what time symptoms started. These symptoms may represent a serious problem that is an emergency. Do not wait to see if the symptoms will go away. Get medical help right away. Call your local emergency services (911 in the [place] Do not drive yourself to the hospital. Summary  Familial hypercholesterolemia (FH) is a genetic disorder that causes a very high level of LDL (low-density lipoprotein) cholesterol.  FH increases your risk for coronary heart disease and stroke at an early age.  Treatment for FH should be started as soon as you are diagnosed with the condition. Treatment is aimed at lowering  your risk for complications.  Follow instructions from your health care provider about eating a healthy diet. Your health care provider may recommend eating less fat and cholesterol. This information is not intended to replace advice given to you by your health care provider. Make sure you discuss any questions you have with your health care provider. Document Revised: 06/06/2017 Document Reviewed: 12/05/2016 Elsevier Patient Education  [place].

## 2020-03-28 ENCOUNTER — Ambulatory Visit: Payer: Medicare HMO | Admitting: Family Medicine

## 2020-04-06 ENCOUNTER — Telehealth: Payer: Self-pay

## 2020-04-06 NOTE — Progress Notes (Signed)
Reviewed chart and adherence measures. Per insurance data, 100% adherence on Lisinopril 10 mg tab and 100% adherence on Rosuvastatin 10 mg tab

## 2020-05-01 ENCOUNTER — Other Ambulatory Visit: Payer: Self-pay | Admitting: Family Medicine

## 2020-05-13 DIAGNOSIS — J209 Acute bronchitis, unspecified: Secondary | ICD-10-CM | POA: Diagnosis not present

## 2020-05-13 DIAGNOSIS — J019 Acute sinusitis, unspecified: Secondary | ICD-10-CM | POA: Diagnosis not present

## 2020-05-13 DIAGNOSIS — B9689 Other specified bacterial agents as the cause of diseases classified elsewhere: Secondary | ICD-10-CM | POA: Diagnosis not present

## 2020-06-02 DIAGNOSIS — J0141 Acute recurrent pansinusitis: Secondary | ICD-10-CM | POA: Diagnosis not present

## 2020-06-06 ENCOUNTER — Telehealth: Payer: Medicare HMO | Admitting: Internal Medicine

## 2020-06-22 ENCOUNTER — Ambulatory Visit: Payer: Self-pay

## 2020-06-22 DIAGNOSIS — E119 Type 2 diabetes mellitus without complications: Secondary | ICD-10-CM

## 2020-06-22 DIAGNOSIS — I10 Essential (primary) hypertension: Secondary | ICD-10-CM

## 2020-06-22 NOTE — Patient Instructions (Signed)
Visit Information It was great speaking with you today!  Please let me know if you have any questions about our visit. Goals Addressed            This Visit's Progress   . Chronic Care Management       CARE PLAN ENTRY (see longitudinal plan of care for additional care plan information)  Current Barriers:  . Chronic Disease Management support, education, and care coordination needs related to Hypertension, Hyperlipidemia, Diabetes, Chronic Kidney Disease, Osteopenia, and Insomnia   Hypertension BP Readings from Last 3 Encounters:  12/28/19 118/76  09/23/19 122/70  07/30/19 126/74   . Pharmacist Clinical Goal(s): o Over the next 90 days, patient will work with PharmD and providers to maintain BP goal <130/80 . Current regimen:  o Lisinopril 10mg  daily o Metoprolol Tartrate 25mg  twice daily . Interventions: o None . Patient self care activities - Over the next 90 days, patient will: o Check BP weekly, document, and provide at future appointments o Ensure daily salt intake < 2300 mg/day  Hyperlipidemia Lab Results  Component Value Date/Time   LDLCALC 49 09/23/2019 10:47 AM   LDLDIRECT 149.5 12/25/2006 08:51 AM   . Pharmacist Clinical Goal(s): o Over the next 90 days, patient will work with PharmD and providers to maintain LDL goal < 70 . Current regimen:  o Rosuvastatin 10mg  daily o Ezetimibe 10 mg daily  . Interventions: o None . Patient self care activities - Over the next 90 days, patient will: o Continue current lifestyle modifications and practices  Diabetes Lab Results  Component Value Date/Time   HGBA1C 5.7 (H) 09/23/2019 10:47 AM   HGBA1C 5.8 (H) 06/25/2019 12:00 AM   . Pharmacist Clinical Goal(s): o Over the next 90 days, patient will work with PharmD and providers to maintain A1c goal <7% . Current regimen:  o None . Interventions: o None . Patient self care activities - Over the next 90 days, patient will: o Check blood sugar once daily, document,  and provide at future appointments o Contact provider with any episodes of hypoglycemia  Medication management . Pharmacist Clinical Goal(s): o Over the next 90 days, patient will work with PharmD and providers to maintain optimal medication adherence . [facility] . Interventions o Comprehensive medication review performed. o Continue current medication management strategy . Patient self care activities - Over the next 90 days, patient will: o Focus on medication adherence by continuing current practices o Take medications as prescribed o Report any questions or concerns to PharmD and/or provider(s)       The patient verbalized understanding of instructions, educational materials, and care plan provided today and agreed to receive a mailed copy of patient instructions, educational materials, and care plan.   Telephone follow up appointment with pharmacy team member scheduled for: 12/21/20 at 8:30 AM   [facility] [phone]

## 2020-06-22 NOTE — Chronic Care Management (AMB) (Signed)
Chronic Care Management Pharmacy  Name: Jacqueline [name]  MRN: [id number] DOB: July 06, 1951   Chief Complaint/ HPI  [name],  69 y.o. , female presents for their Follow-Up CCM visit with the clinical pharmacist via telephone.  PCP : Delsa Grana, PA-C  Their chronic conditions include: Hypertension, Hyperlipidemia, Diabetes, Chronic Kidney Disease, Osteopenia, and Insomnia  Office Visits: 6/22 exam, BP 118/76 P 75 Wt 206 BMI 35.4, LE edema, peripheral neuropathy, class 2 obesity, not on low salt diet,   Consult Visit: 05/13/20: Patient presented to Dr. Duanne Moron for sinusitis. Started Mucinez twice daily for 7-10 days.  5/18 leg pain, Ste[place], BP 97/61 P 65 Wt 191 32.8, leg cramping  Medications: Outpatient Encounter Medications as of 06/22/2020  Medication Sig Note  . aspirin 81 MG chewable tablet Chew 81 mg by mouth daily.   . calcium carbonate (OS-CAL) 600 MG TABS tablet Take 1 tablet (600 mg total) by mouth 2 (two) times daily with a meal.   . gabapentin (NEURONTIN) 300 MG capsule Take 300 mg by mouth 3 (three) times daily. 06/22/2020: Prescribed by Chipper Herb, NP   . lisinopril (ZESTRIL) 10 MG tablet Take 1 tablet (10 mg total) by mouth daily.   . Magnesium Oxide 500 MG CAPS Take 1 capsule by mouth daily.   . metoprolol tartrate (LOPRESSOR) 25 MG tablet Take 1 tablet (25 mg total) by mouth 2 (two) times daily.   . Potassium Gluconate 595 MG CAPS Take 595 mg by mouth daily.   Marland Kitchen VITAMIN E PO Take 800 Units by mouth daily.   Marland Kitchen zinc gluconate 50 MG tablet Take 50 mg by mouth daily.   . blood glucose meter kit and supplies KIT Lifescan meter Check blood sugar 2 times daily as needed   . Cholecalciferol (VITAMIN D) 50 MCG (2000 UT) CAPS Take by mouth. (Patient not taking: No sig reported)   . ezetimibe (ZETIA) 10 MG tablet Take 1 tablet (10 mg total) by mouth daily.   . Lancets (ONETOUCH DELICA PLUS [id number]) MISC USE AS DIRECTED TWICE A DAY AS NEEDED   . ONETOUCH  ULTRA test strip USE AS DIRECTED TWICE A DAY AS NEEDED   . rosuvastatin (CRESTOR) 10 MG tablet Take 1 tablet (10 mg total) by mouth daily.   Marland Kitchen tiZANidine (ZANAFLEX) 2 MG tablet TAKE 1-2 TABLETS (2-4 MG TOTAL) BY MOUTH EVERY 8 (EIGHT) HOURS AS NEEDED FOR MUSCLE SPASMS. (Patient not taking: Reported on 03/16/2020)   . traZODone (DESYREL) 50 MG tablet TAKE 1/2-1 TABLET BY MOUTH AT BEDTIME AS NEEDED FOR SLEEP   . [DISCONTINUED] aspirin EC 81 MG tablet Take 81 mg by mouth daily. Swallow whole. (Patient not taking: Reported on 03/16/2020)   . [DISCONTINUED] atorvastatin (LIPITOR) 10 MG tablet TAKE 1 TABLET (10 MG TOTAL) BY MOUTH DAILY. MUST SCHEDULE ANNUAL PHYSICAL (Patient not taking: Reported on 06/25/2019)   . [DISCONTINUED] gabapentin (NEURONTIN) 100 MG capsule Take 100 mg by mouth 3 (three) times daily. (Patient not taking: Reported on 03/16/2020)   . [DISCONTINUED] Multiple Vitamin (MULTIVITAMIN) tablet Take 1 tablet by mouth daily.   (Patient not taking: Reported on 03/16/2020)   . [DISCONTINUED] OVER THE COUNTER MEDICATION Take 100 mg by mouth daily. Potassium (Patient not taking: Reported on 12/28/2019)   . [DISCONTINUED] OVER THE COUNTER MEDICATION Take 400 mg by mouth daily. Magnesium     No facility-administered encounter medications on file as of 06/22/2020.   Current Diagnosis/Assessment:  SDOH Interventions   Flowsheet Row Most Recent  Value  SDOH Interventions   Financial Strain Interventions Intervention Not Indicated  Transportation Interventions Intervention Not Indicated     Goals Addressed            This Visit's Progress   . Chronic Care Management       CARE PLAN ENTRY (see longitudinal plan of care for additional care plan information)  Current Barriers:  . Chronic Disease Management support, education, and care coordination needs related to Hypertension, Hyperlipidemia, Diabetes, Chronic Kidney Disease, Osteopenia, and Insomnia   Hypertension BP Readings from Last 3  Encounters:  12/28/19 118/76  09/23/19 122/70  07/30/19 126/74   . Pharmacist Clinical Goal(s): o Over the next 90 days, patient will work with PharmD and providers to maintain BP goal <130/80 . Current regimen:  o Lisinopril 46m daily o Metoprolol Tartrate 263mtwice daily . Interventions: o None . Patient self care activities - Over the next 90 days, patient will: o Check BP weekly, document, and provide at future appointments o Ensure daily salt intake < 2300 mg/day  Hyperlipidemia Lab Results  Component Value Date/Time   LDLCALC 49 09/23/2019 10:47 AM   LDLDIRECT 149.5 12/25/2006 08:51 AM   . Pharmacist Clinical Goal(s): o Over the next 90 days, patient will work with PharmD and providers to maintain LDL goal < 70 . Current regimen:  o Rosuvastatin 1023maily o Ezetimibe 10 mg daily  . Interventions: o None . Patient self care activities - Over the next 90 days, patient will: o Continue current lifestyle modifications and practices  Diabetes Lab Results  Component Value Date/Time   HGBA1C 5.7 (H) 09/23/2019 10:47 AM   HGBA1C 5.8 (H) 06/25/2019 12:00 AM   . Pharmacist Clinical Goal(s): o Over the next 90 days, patient will work with PharmD and providers to maintain A1c goal <7% . Current regimen:  o None . Interventions: o None . Patient self care activities - Over the next 90 days, patient will: o Check blood sugar once daily, document, and provide at future appointments o Contact provider with any episodes of hypoglycemia  Medication management . Pharmacist Clinical Goal(s): o Over the next 90 days, patient will work with PharmD and providers to maintain optimal medication adherence . [facility] . Interventions o Comprehensive medication review performed. o Continue current medication management strategy . Patient self care activities - Over the next 90 days, patient will: o Focus on medication adherence by continuing current practices o Take  medications as prescribed o Report any questions or concerns to PharmD and/or provider(s)      Hypertension   BP goal is:  <130/80  Office blood pressures are  BP Readings from Last 3 Encounters:  12/28/19 118/76  09/23/19 122/70  07/30/19 126/74   Lab Results  Component Value Date   CREATININE 0.85 12/28/2019   BUN 17 12/28/2019   GFR 45.58 (L) 04/30/2019   GFRNONAA 70 12/28/2019   GFRAA 82 12/28/2019   NA 141 12/28/2019   K 4.6 12/28/2019   CALCIUM 9.1 12/28/2019   CO2 29 12/28/2019   Patient checks BP at home daily Patient home BP readings are ranging: 118/80  Patient has failed these meds in the past: NA Patient is currently controlled on the following medications:  . Lisinopril 26m81mily . Metoprolol tartrate 25mg83mce daily  We discussed: Blood pressure stable although patient does not check frequently.   Plan  Continue current medications   Diabetes   Recent Relevant Labs: Lab Results  Component Value  Date/Time   HGBA1C 5.7 (H) 09/23/2019 10:47 AM   HGBA1C 5.8 (H) 06/25/2019 12:00 AM   MICROALBUR <0.2 12/28/2019 10:30 AM   MICROALBUR <0.2 06/25/2019 12:00 AM     Checking BG: Weekly  Recent pre-meal BG readings: Patient is currently controlled on the following medications: None  Last diabetic Foot exam:  Lab Results  Component Value Date/Time   HMDIABEYEEXA No Retinopathy 07/29/2019 12:00 AM    Last diabetic Eye exam: No results found for: HMDIABFOOTEX   We discussed: Discussed importance of continuing dietary control through the holiday season.   Plan  Continue current medications and control with diet and exercise   Hyperlipidemia   LDL goal < 70  Lipid Panel     Component Value Date/Time   CHOL 106 09/23/2019 1047   TRIG 113 09/23/2019 1047   HDL 37 (L) 09/23/2019 1047   LDLCALC 49 09/23/2019 1047   LDLDIRECT 149.5 12/25/2006 0851    Hepatic Function Latest Ref Rng & Units 12/28/2019 09/23/2019 06/25/2019  Total Protein 6.1  - 8.1 g/dL 6.7 6.6 6.9  Albumin 3.8 - 4.8 g/dL - - -  AST 10 - 35 U/L 18 20 15   ALT 6 - 29 U/L 19 26 16   Alk Phosphatase 39 - 117 IU/L - - -  Total Bilirubin 0.2 - 1.2 mg/dL 0.5 0.6 0.4  Bilirubin, Direct 0.0 - 0.3 mg/dL - - -     The ASCVD Risk score ([place]., et al., 2013) failed to calculate for the following reasons:   The valid total cholesterol range is 130 to 320 mg/dL   Patient has failed these meds in past: Lipitor Patient is currently controlled on the following medications:  . Aspirin 81 mg daily  . Ezetimibe 10 mg daily  . Rosuvastatin 44m daily  We discussed:  At goal Denies myalgias  Plan  Continue current medications  Osteopenia / Osteoporosis   Last DEXA Scan: 12/22/18   T-Score femoral neck: -1.8  T-Score total hip: NA  T-Score lumbar spine: -0.9  T-Score forearm radius: NA  10-year probability of major osteoporotic fracture: 17.3%  10-year probability of hip fracture: 2.2%  VITD  Date Value Ref Range Status  09/25/2017 26.17 (L) 30.00 - 100.00 ng/mL Final     Patient is not a candidate for pharmacologic treatment  Patient has failed these meds in past: NA Patient is currently controlled on the following medications:  . Calcium carbonate 600 mg twice daily  . Vitamin D3 2000 units daily (has not started yet)   We discussed:  Recommend [id number] units of vitamin D daily. Recommend 1200 mg of calcium daily from dietary and supplemental sources.  Plan  Continue current medications  Neuropathy    Managed by AChipper Herb NP  Patient has failed these meds in past: NA Patient is currently controlled on the following medications:  . Gabapentin 300 mg TID  . Tizanidine 2 mg 1-2 tablets q8hr PRN  . Vitamin E 800 daily We discussed:  Tolerating dose increase of gabapentin well, denies symptoms of oversedation, dizziness, or grogginess. Reports rarely using tizanidine.   Plan  Continue current medications   Misc / OTC    . Magnesium  oxide 500 mg daily . Trazodone 50 mg 1/2-1 tablet QHS PRN . Zinc 50 mg daily  . Potassium 595 mg daily   Plan  Continue current medications  Vaccines   Reviewed and discussed patient's vaccination history.    Immunization History  Administered Date(s) Administered  .  Influenza,inj,Quad PF,6+ Mos 07/30/2018  . Td 12/27/1997, 11/27/2009  . Zoster 10/27/2013    Plan  Recommended patient receive Covid vaccine.  Medication Management   [facility] Uses pill box? Yes Pt endorses 100% compliance  We discussed:   Plan  Continue current medication management strategy  Follow up: 3 month phone visit  [name] [phone]

## 2020-06-26 ENCOUNTER — Other Ambulatory Visit: Payer: Self-pay

## 2020-06-26 ENCOUNTER — Encounter: Payer: Self-pay | Admitting: Internal Medicine

## 2020-06-26 ENCOUNTER — Telehealth (INDEPENDENT_AMBULATORY_CARE_PROVIDER_SITE_OTHER): Payer: Medicare HMO | Admitting: Internal Medicine

## 2020-06-26 DIAGNOSIS — J0101 Acute recurrent maxillary sinusitis: Secondary | ICD-10-CM

## 2020-06-26 MED ORDER — AZITHROMYCIN 250 MG PO TABS: ORAL_TABLET | ORAL | 0 refills | Status: DC

## 2020-06-26 NOTE — Progress Notes (Signed)
Name: Annet Manukyan [facility]   MRN: [id number]    DOB: 1951/05/10   Date:06/26/2020       Progress Note  Subjective  Chief Complaint  Chief Complaint  Patient presents with  . Fever  . Cough  . Nasal Congestion  . Headache  . Facial Pain    I connected with  Aayushi Solorzano [facility] on 06/26/20 at 11:20 AM EST by telephone and verified that I am speaking with the correct person using two identifiers.  I discussed the limitations, risks, security and privacy concerns of performing an evaluation and management service by telephone and the availability of in person appointments. The patient expressed understanding and agreed to proceed. Staff also discussed with the patient that there may be a patient responsible charge related to this service. Patient Location: Home Provider Location: [facility] Additional Individuals present: none  HPI Patient is a 69 year old female patient of Delsa Grana Last visit with her was in June 2021 Follows up today with the above complaints.  Is a phone visit with the marked limitations noted with this being a phone visit.  Covid vaccination status - had Covid vaccine X2, last one in October.  Her symptoms started 1st of November and went to urgent care on weekend, told had sinus infection and bronchitis, received pred and amox, got a little better and got better and then worsened again. Went to [facility] Nov 26 and told still had a sinus infection, cefdinir prescribed and not better. Increased congestion and sinus pain under her eyes - hurts to press and also the bone above her eyes + cough - a lot,+ production of phlegm, not green, thick No marked SOB + fever, Sat night was 100, last night felt feverish - chills and sweats, not have one today No sore throat.  + congestion mostly in sinuses, + PND No loss of smell, loss of taste No N/V No bad muscle aches No marked loose stools/diarrhea (except when on Amox) No CP, passing out episodes Has tried ibuprofen - 800  mg at a time, mucinex   Former smoker-quit in 2015 Comorbid conditions reviewed No asthma/COPD hx,  No h/o HD +  DM, borderline, not on meds for this   + CKD,  + obesity  All - NKDA Still work, Clinical cytogeneticist.  [facility] and had at home Covid tests X 2 yesterday and both negative  Has an appt with Leisa next Monday already scheduled  Patient Active Problem List   Diagnosis Date Noted  . Peripheral edema 12/28/2019  . Low serum vitamin B12 09/23/2019  . Stage 3a chronic kidney disease ([place]) 09/23/2019  . Myalgia 06/25/2019  . Muscle weakness 06/25/2019  . Controlled type 2 diabetes mellitus without complication, without long-term current use of insulin ([place]) 04/01/2019  . FUO (fever of unknown origin) 04/01/2019  . History of kidney stones 10/14/2018  . Insomnia 10/14/2018  . Essential hypertension 01/14/2008  . Class 2 severe obesity with serious comorbidity and body mass index (BMI) of 35.0 to 35.9 in adult ([place]) 12/25/2006  . HLD (hyperlipidemia) 11/11/2006  . Osteopenia 11/11/2006    Past Surgical History:  Procedure Laterality Date  . BREAST BIOPSY Right 08/19/2012   neg, Dr. Bary Castilla  . extraction of renal stones    . TONSILLECTOMY AND ADENOIDECTOMY    . VAGINAL HYSTERECTOMY  1980   class IV, ovaries intact    Family History  Problem Relation Age of Onset  . Hypertension Mother   . Dementia Mother   .  Stroke Mother   . Cancer - Other Father        Bile Duct  . Heart disease Maternal Grandmother   . Heart disease Maternal Grandfather   . Hypertension Paternal Grandmother   . Cancer Maternal Aunt        breast and lung  . Stroke Paternal Grandfather     Social History   Tobacco Use  . Smoking status: Former Smoker    Packs/day: 0.25    Years: 30.00    Pack years: 7.50    Types: Cigarettes    Start date: 07/09/1983    Quit date: 05/27/2014    Years since quitting: 6.0  . Smokeless tobacco: Never Used  Substance Use Topics  . Alcohol use:  Yes    Alcohol/week: 1.0 standard drink    Types: 1 Glasses of wine per week    Comment: occasional     Current Outpatient Medications:  .  aspirin 81 MG chewable tablet, Chew 81 mg by mouth daily., Disp: , Rfl:  .  blood glucose meter kit and supplies KIT, Lifescan meter Check blood sugar 2 times daily as needed, Disp: 1 each, Rfl: 0 .  calcium carbonate (OS-CAL) 600 MG TABS tablet, Take 1 tablet (600 mg total) by mouth 2 (two) times daily with a meal., Disp: 30 tablet, Rfl: 0 .  Cholecalciferol (VITAMIN D) 50 MCG (2000 UT) CAPS, Take by mouth., Disp: 30 capsule, Rfl:  .  gabapentin (NEURONTIN) 300 MG capsule, Take 300 mg by mouth 3 (three) times daily., Disp: , Rfl:  .  Lancets (ONETOUCH DELICA PLUS [id number]) MISC, USE AS DIRECTED TWICE A DAY AS NEEDED, Disp: 200 each, Rfl: 0 .  lisinopril (ZESTRIL) 10 MG tablet, Take 1 tablet (10 mg total) by mouth daily., Disp: 90 tablet, Rfl: 3 .  Magnesium Oxide 500 MG CAPS, Take 1 capsule by mouth daily., Disp: , Rfl:  .  metoprolol tartrate (LOPRESSOR) 25 MG tablet, Take 1 tablet (25 mg total) by mouth 2 (two) times daily., Disp: 180 tablet, Rfl: 3 .  ONETOUCH ULTRA test strip, USE AS DIRECTED TWICE A DAY AS NEEDED, Disp: 200 strip, Rfl: 1 .  Potassium Gluconate 595 MG CAPS, Take 595 mg by mouth daily., Disp: , Rfl:  .  traZODone (DESYREL) 50 MG tablet, TAKE 1/2-1 TABLET BY MOUTH AT BEDTIME AS NEEDED FOR SLEEP, Disp: 90 tablet, Rfl: 2 .  VITAMIN E PO, Take 800 Units by mouth daily., Disp: , Rfl:  .  zinc gluconate 50 MG tablet, Take 50 mg by mouth daily., Disp: , Rfl:  .  ezetimibe (ZETIA) 10 MG tablet, Take 1 tablet (10 mg total) by mouth daily., Disp: 90 tablet, Rfl: 3 .  rosuvastatin (CRESTOR) 10 MG tablet, Take 1 tablet (10 mg total) by mouth daily., Disp: 90 tablet, Rfl: 3  No Known Allergies  With staff assistance, above reviewed with the patient today.  ROS: As per HPI, otherwise no specific complaints on a limited and focused system  review   Objective  Virtual encounter, vitals not obtained.  There is no height or weight on file to calculate BMI.  Physical Exam   Appears in NAD via conversation, obvious nasal voice, pleasant Breathing: No obvious respiratory distress. Speaking in complete sentences Neurological: Pt is alert,Speech is normal Psychiatric: Patient has a normal mood and affect, behavior is normal. Judgment and thought content normal.   No results found for this or any previous visit (from the past 72 hour(s)).  PHQ2/9:  Depression screen [facility] 2/9 06/26/2020 12/28/2019 09/23/2019 07/30/2019 06/25/2019  Decreased Interest 0 0 0 0 0  Down, Depressed, Hopeless 0 0 0 0 0  PHQ - 2 Score 0 0 0 0 0  Altered sleeping - 0 0 - 0  Tired, decreased energy - 0 0 - 0  Change in appetite - 0 0 - 0  Feeling bad or failure about yourself  - 0 0 - 0  Trouble concentrating - 0 0 - 0  Moving slowly or fidgety/restless - 0 0 - 0  Suicidal thoughts - 0 0 - 0  PHQ-9 Score - 0 0 - 0  Difficult doing work/chores - Not difficult at all Not difficult at all - Not difficult at all   PHQ-2/9 Result reviewed  Fall Risk: Fall Risk  06/26/2020 12/28/2019 09/23/2019 07/30/2019 06/25/2019  Falls in the past year? 0 0 1 1 1   Number falls in past yr: 0 0 1 1 1   Comment - - - - -  Injury with Fall? 0 0 0 0 0  Comment - - - - -  Risk for fall due to : - - - History of fall(s) -  Risk for fall due to: Comment - - - - -  Follow up - Falls evaluation completed - Falls prevention discussed -     Assessment & Plan  1. Acute recurrent maxillary sinusitis Concerns presently for recurrent sinus infection, with the nasal congestion and the pain in the sinuses recurring, hurts when she presses on her sinus region.  Discussed the possibility of Covid, noting the pandemic and increases in cases recently, and she has had the vaccine and did a home test x2 yesterday which were negative.  Noted still could be a possibility.  Felt best to  proceed as follows: We will add a Z-Pak as directed Also a Flonase product-use twice daily for the first 3 days, then once daily after.  Offered to send a prescription for her, although she noted she can pick that up over-the-counter. Continue the Mucinex product, can continue ibuprofen products with food as needed, and if her stomach gets more upset, change to Tylenol products as needed. Rest and staying well-hydrated recommended  Keep her follow-up with Leisa next week as was originally planned, and follow-up sooner as needed. Did note if her symptoms are not improving, likely will need to get a PCR Covid test and possible further work-up including imaging studies and/or referral to ENT pending her status on that follow-up visit.   I discussed the assessment and treatment plan with the patient. The patient was provided an opportunity to ask questions and all were answered. The patient agreed with the plan and demonstrated an understanding of the instructions.   The patient was advised to call back or seek an in-person evaluation if the symptoms worsen or if the condition fails to improve as anticipated.  I provided 20 minutes of non-face-to-face time during this encounter that included discussing at length patient's sx/history, pertinent pmhx, medications, treatment and follow up plan. This time also included the necessary documentation, orders, and chart review.  Towanda Malkin, MD

## 2020-06-28 ENCOUNTER — Ambulatory Visit: Payer: Medicare HMO | Admitting: Family Medicine

## 2020-07-03 ENCOUNTER — Other Ambulatory Visit: Payer: Self-pay

## 2020-07-03 ENCOUNTER — Ambulatory Visit (INDEPENDENT_AMBULATORY_CARE_PROVIDER_SITE_OTHER): Payer: Medicare HMO | Admitting: Family Medicine

## 2020-07-03 ENCOUNTER — Encounter: Payer: Self-pay | Admitting: Family Medicine

## 2020-07-03 VITALS — BP 124/82 | HR 97 | Temp 97.9°F | Resp 16 | Ht 64.0 in | Wt 213.8 lb

## 2020-07-03 DIAGNOSIS — N1831 Chronic kidney disease, stage 3a: Secondary | ICD-10-CM

## 2020-07-03 DIAGNOSIS — J209 Acute bronchitis, unspecified: Secondary | ICD-10-CM | POA: Diagnosis not present

## 2020-07-03 DIAGNOSIS — Z5181 Encounter for therapeutic drug level monitoring: Secondary | ICD-10-CM

## 2020-07-03 DIAGNOSIS — J310 Chronic rhinitis: Secondary | ICD-10-CM | POA: Diagnosis not present

## 2020-07-03 DIAGNOSIS — R7989 Other specified abnormal findings of blood chemistry: Secondary | ICD-10-CM | POA: Diagnosis not present

## 2020-07-03 DIAGNOSIS — I700 Atherosclerosis of aorta: Secondary | ICD-10-CM

## 2020-07-03 DIAGNOSIS — Z23 Encounter for immunization: Secondary | ICD-10-CM | POA: Diagnosis not present

## 2020-07-03 DIAGNOSIS — G4700 Insomnia, unspecified: Secondary | ICD-10-CM | POA: Diagnosis not present

## 2020-07-03 DIAGNOSIS — I10 Essential (primary) hypertension: Secondary | ICD-10-CM

## 2020-07-03 DIAGNOSIS — G629 Polyneuropathy, unspecified: Secondary | ICD-10-CM

## 2020-07-03 DIAGNOSIS — E119 Type 2 diabetes mellitus without complications: Secondary | ICD-10-CM | POA: Diagnosis not present

## 2020-07-03 DIAGNOSIS — J329 Chronic sinusitis, unspecified: Secondary | ICD-10-CM

## 2020-07-03 DIAGNOSIS — M8589 Other specified disorders of bone density and structure, multiple sites: Secondary | ICD-10-CM

## 2020-07-03 DIAGNOSIS — E785 Hyperlipidemia, unspecified: Secondary | ICD-10-CM

## 2020-07-03 DIAGNOSIS — R609 Edema, unspecified: Secondary | ICD-10-CM

## 2020-07-03 DIAGNOSIS — E538 Deficiency of other specified B group vitamins: Secondary | ICD-10-CM

## 2020-07-03 MED ORDER — PREDNISONE 20 MG PO TABS: 40.0000 mg | ORAL_TABLET | Freq: Every day | ORAL | 0 refills | Status: AC

## 2020-07-03 MED ORDER — BENZONATATE 100 MG PO CAPS: 100.0000 mg | ORAL_CAPSULE | Freq: Three times a day (TID) | ORAL | 0 refills | Status: DC | PRN

## 2020-07-03 MED ORDER — ALBUTEROL SULFATE HFA 108 (90 BASE) MCG/ACT IN AERS: 2.0000 | INHALATION_SPRAY | RESPIRATORY_TRACT | 1 refills | Status: DC | PRN

## 2020-07-03 MED ORDER — LISINOPRIL 10 MG PO TABS: 10.0000 mg | ORAL_TABLET | Freq: Every day | ORAL | 3 refills | Status: DC

## 2020-07-03 MED ORDER — ROSUVASTATIN CALCIUM 10 MG PO TABS: 10.0000 mg | ORAL_TABLET | Freq: Every day | ORAL | 3 refills | Status: DC

## 2020-07-03 MED ORDER — TRAZODONE HCL 50 MG PO TABS: 50.0000 mg | ORAL_TABLET | Freq: Every day | ORAL | 3 refills | Status: DC

## 2020-07-03 MED ORDER — EZETIMIBE 10 MG PO TABS: 10.0000 mg | ORAL_TABLET | Freq: Every day | ORAL | 3 refills | Status: DC

## 2020-07-03 MED ORDER — LEVOCETIRIZINE DIHYDROCHLORIDE 5 MG PO TABS: 5.0000 mg | ORAL_TABLET | Freq: Every evening | ORAL | 2 refills | Status: DC

## 2020-07-03 NOTE — Progress Notes (Signed)
Name: Jacqueline [name] [facility]   MRN: [id number]    DOB: Feb 05, 1951   Date:07/03/2020       Progress Note  Chief Complaint  Patient presents with  . Hyperlipidemia  . Hypertension     Subjective:   Jacqueline [name] is a 69 y.o. female, presents to clinic for routine fup  Ill last week with sinus pressure and congestion and chest congestion/cough, took zpak last week, sinuses feel better, in the past 2 months she has taken prednisone - seen at [facility] early November - prednisone helped for a bit She did go to [facility] around thanksgiving Friday after T-day, got an antibiotic Virtual visit one week ago - zpak - sinus sx improved but still coughing,e SOB and little wheeze Doing flonase and mucinex - not on an antihistamine  DM:   Pt managing DM with diet and lifestyle -her A1c did improve when she worked on her diet over the past 6 months or so she has not been watching what she eats very much, especially through the holidays Last time she checked her blood sugar it was 160 after eating, other times fasting was 120 Denies: Polyuria, polydipsia, vision changes, neuropathy, hypoglycemia Recent pertinent labs: Lab Results  Component Value Date   HGBA1C 5.7 (H) 09/23/2019   HGBA1C 5.8 (H) 06/25/2019   HGBA1C 6.6 (H) 03/26/2019   Standard of care and health maintenance: Urine Microalbumin: Due Foot exam: Due, done today DM eye exam: Due in January ACEI/ARB: On lisinopril Statin: On Crestor   Hypertension:  Currently managed on lisinopril 10 and metoprolol Pt reports good med compliance and denies any SE.   Blood pressure today is well controlled. BP Readings from Last 3 Encounters:  07/03/20 124/82  12/28/19 118/76  09/23/19 122/70  Pt denies CP, SOB, exertional sx, LE edema, palpitation, Ha's, visual disturbances, lightheadedness, hypotension, syncope.  Palpitations - well controlled with metoprolol, no exertional symptoms, bradycardia, palpitations, she does  follow with Dr. Rockey Situ she has follow-up in the next month  Hyperlipidemia: Currently treated with rosuvastatin 10 and zetia, pt reports good med compliance Last Lipids: Lab Results  Component Value Date   CHOL 106 09/23/2019   HDL 37 (L) 09/23/2019   LDLCALC 49 09/23/2019   LDLDIRECT 149.5 12/25/2006   TRIG 113 09/23/2019   CHOLHDL 2.9 09/23/2019  - Denies: Chest pain, shortness of breath, myalgias, claudication On and off statins due to leg pain but there was no change to her symptoms and she was eventually diagnosed with a neuropathy she has been on her rosuvastatin and Zetia consistently for the past 6 months  Neuropathy - managed with gabapentin TID - managing well, she does have a follow-up appointment next May with [facility] neurology  Insomnia - trazodone 50 mg daily for sleep still effective      Current Outpatient Medications:  .  aspirin 81 MG chewable tablet, Chew 81 mg by mouth daily., Disp: , Rfl:  .  azithromycin (ZITHROMAX) 250 MG tablet, Take 2 tabs on day 1, then 1 tab daily the next 4 days, Disp: 6 tablet, Rfl: 0 .  blood glucose meter kit and supplies KIT, Lifescan meter Check blood sugar 2 times daily as needed, Disp: 1 each, Rfl: 0 .  calcium carbonate (OS-CAL) 600 MG TABS tablet, Take 1 tablet (600 mg total) by mouth 2 (two) times daily with a meal., Disp: 30 tablet, Rfl: 0 .  Cholecalciferol (VITAMIN D) 50 MCG (2000 UT) CAPS, Take by mouth., Disp: 30  capsule, Rfl:  .  ezetimibe (ZETIA) 10 MG tablet, Take 1 tablet (10 mg total) by mouth daily., Disp: 90 tablet, Rfl: 3 .  gabapentin (NEURONTIN) 300 MG capsule, Take 300 mg by mouth 3 (three) times daily., Disp: , Rfl:  .  Lancets (ONETOUCH DELICA PLUS [id number]) MISC, USE AS DIRECTED TWICE A DAY AS NEEDED, Disp: 200 each, Rfl: 0 .  lisinopril (ZESTRIL) 10 MG tablet, Take 1 tablet (10 mg total) by mouth daily., Disp: 90 tablet, Rfl: 3 .  Magnesium Oxide 500 MG CAPS, Take 1 capsule by mouth daily., Disp: , Rfl:  .   metoprolol tartrate (LOPRESSOR) 25 MG tablet, Take 1 tablet (25 mg total) by mouth 2 (two) times daily., Disp: 180 tablet, Rfl: 3 .  ONETOUCH ULTRA test strip, USE AS DIRECTED TWICE A DAY AS NEEDED, Disp: 200 strip, Rfl: 1 .  Potassium Gluconate 595 MG CAPS, Take 595 mg by mouth daily., Disp: , Rfl:  .  rosuvastatin (CRESTOR) 10 MG tablet, Take 1 tablet (10 mg total) by mouth daily., Disp: 90 tablet, Rfl: 3 .  traZODone (DESYREL) 50 MG tablet, TAKE 1/2-1 TABLET BY MOUTH AT BEDTIME AS NEEDED FOR SLEEP, Disp: 90 tablet, Rfl: 2 .  VITAMIN E PO, Take 800 Units by mouth daily., Disp: , Rfl:  .  zinc gluconate 50 MG tablet, Take 50 mg by mouth daily., Disp: , Rfl:   Patient Active Problem List   Diagnosis Date Noted  . Peripheral edema 12/28/2019  . Low serum vitamin B12 09/23/2019  . Stage 3a chronic kidney disease ([place]) 09/23/2019  . Myalgia 06/25/2019  . Muscle weakness 06/25/2019  . Controlled type 2 diabetes mellitus without complication, without long-term current use of insulin ([place]) 04/01/2019  . FUO (fever of unknown origin) 04/01/2019  . History of kidney stones 10/14/2018  . Insomnia 10/14/2018  . Essential hypertension 01/14/2008  . Class 2 severe obesity with serious comorbidity and body mass index (BMI) of 35.0 to 35.9 in adult ([place]) 12/25/2006  . HLD (hyperlipidemia) 11/11/2006  . Osteopenia 11/11/2006    Past Surgical History:  Procedure Laterality Date  . BREAST BIOPSY Right 08/19/2012   neg, Dr. Bary Castilla  . extraction of renal stones    . TONSILLECTOMY AND ADENOIDECTOMY    . VAGINAL HYSTERECTOMY  1980   class IV, ovaries intact    Family History  Problem Relation Age of Onset  . Hypertension Mother   . Dementia Mother   . Stroke Mother   . Cancer - Other Father        Bile Duct  . Heart disease Maternal Grandmother   . Heart disease Maternal Grandfather   . Hypertension Paternal Grandmother   . Cancer Maternal Aunt        breast and lung  . Stroke Paternal  Grandfather     Social History   Tobacco Use  . Smoking status: Former Smoker    Packs/day: 0.25    Years: 30.00    Pack years: 7.50    Types: Cigarettes    Start date: 07/09/1983    Quit date: 05/27/2014    Years since quitting: 6.1  . Smokeless tobacco: Never Used  Vaping Use  . Vaping Use: Never used  Substance Use Topics  . Alcohol use: Yes    Alcohol/week: 1.0 standard drink    Types: 1 Glasses of wine per week    Comment: occasional  . Drug use: No     No Known Allergies  Health Maintenance  Topic Date Due  . PNA vac Low Risk Adult (1 of 2 - PCV13) Never done  . TETANUS/TDAP  11/28/2019  . INFLUENZA VACCINE  02/06/2020  . HEMOGLOBIN A1C  03/25/2020  . COVID-19 Vaccine (3 - Pfizer risk 4-dose series) 05/11/2020  . FOOT EXAM  06/24/2020  . OPHTHALMOLOGY EXAM  07/28/2020  . DEXA SCAN  12/21/2020  . MAMMOGRAM  01/06/2021  . COLONOSCOPY (Pts 45-56yr Insurance coverage will need to be confirmed)  08/14/2022  . Hepatitis C Screening  Completed    Chart Review Today: I personally reviewed active problem list, medication list, allergies, family history, social history, health maintenance, notes from last encounter, lab results, imaging with the patient/caregiver today.   Review of Systems  10 Systems reviewed and are negative for acute change except as noted in the HPI.  Objective:   Vitals:   07/03/20 1028  BP: 124/82  Pulse: 97  Resp: 16  Temp: 97.9 F (36.6 C)  TempSrc: Oral  SpO2: 97%  Weight: 213 lb 12.8 oz (97 kg)  Height: 5' 4" (1.626 m)    Body mass index is 36.7 kg/m.  Physical Exam Vitals reviewed.  Constitutional:      General: She is not in acute distress.    Appearance: Normal appearance. She is well-developed and well-nourished. She is obese. She is not ill-appearing, toxic-appearing or diaphoretic.  HENT:     Head: Normocephalic and atraumatic.     Right Ear: Tympanic membrane, ear canal and external ear normal. There is no impacted  cerumen.     Left Ear: Tympanic membrane, ear canal and external ear normal. There is no impacted cerumen.     Nose: Congestion and rhinorrhea present.     Mouth/Throat:     Mouth: Mucous membranes are normal. Mucous membranes are moist.     Pharynx: Oropharynx is clear. Uvula midline. Posterior oropharyngeal erythema present. No oropharyngeal exudate.  Eyes:     General: Lids are normal. No scleral icterus.       Right eye: No discharge.        Left eye: No discharge.     Extraocular Movements: EOM normal.     Conjunctiva/sclera: Conjunctivae normal.  Neck:     Trachea: Phonation normal. No tracheal deviation.  Cardiovascular:     Rate and Rhythm: Normal rate and regular rhythm.     Pulses: Normal pulses.          Radial pulses are 2+ on the right side and 2+ on the left side.       Posterior tibial pulses are 2+ on the right side and 2+ on the left side.     Heart sounds: Normal heart sounds. No murmur heard. No friction rub. No gallop.   Pulmonary:     Effort: Pulmonary effort is normal. No respiratory distress.     Breath sounds: No stridor. No wheezing or rales.     Comments: Slightly poor inspiratory effort on exam, with improved effort pt had more coughing fits, scattered rhonchi cleared after coughing, and BS were clear in all lung fields A&P w/o rales or wheeze Intermittent coughing, no respiratory distress, tachypnea, speaking in full and complete sentences Abdominal:     General: Bowel sounds are normal. There is no distension.     Palpations: Abdomen is soft.     Tenderness: There is no abdominal tenderness. There is no guarding or rebound.  Musculoskeletal:        General: No deformity or edema. Normal  range of motion.     Cervical back: Normal range of motion and neck supple.  Lymphadenopathy:     Cervical: No cervical adenopathy.  Skin:    General: Skin is warm, dry and intact.     Capillary Refill: Capillary refill takes less than 2 seconds.     Coloration: Skin  is not pale.     Findings: No rash.  Neurological:     Mental Status: She is alert and oriented to person, place, and time.     Motor: No abnormal muscle tone.     Gait: Gait normal.  Psychiatric:        Mood and Affect: Mood and affect normal.        Speech: Speech normal.        Behavior: Behavior normal.         Assessment & Plan:     ICD-10-CM   1. Essential hypertension  I10 lisinopril (ZESTRIL) 10 MG tablet    COMPLETE METABOLIC PANEL WITH GFR    levocetirizine (XYZAL) 5 MG tablet   stable, well controlled, BP at goal today, continue same management  2. Controlled type 2 diabetes mellitus without complication, without long-term current use of insulin (HCC)  F81.8 COMPLETE METABOLIC PANEL WITH GFR    Hemoglobin A1c   Has been controlled with diet and lifestyle however no recent dietary efforts discussed goals for A1c, patient not on meds, check labs  3. Hyperlipidemia, unspecified hyperlipidemia type  E78.5 rosuvastatin (CRESTOR) 10 MG tablet    ezetimibe (ZETIA) 10 MG tablet    COMPLETE METABOLIC PANEL WITH GFR    Lipid panel   Patient has been compliant with Crestor and Zetia, no side effects or concerns  4. Stage 3a chronic kidney disease (HCC)  E99.37 COMPLETE METABOLIC PANEL WITH GFR   Monitor renal function  5. Osteopenia of multiple sites  J69.67 COMPLETE METABOLIC PANEL WITH GFR   Monitor calcium and dexa  6. Aortic atherosclerosis (HCC)  I70.0 rosuvastatin (CRESTOR) 10 MG tablet    ezetimibe (ZETIA) 10 MG tablet   On statin, monitoring  7. LFT elevation  E93.81 COMPLETE METABOLIC PANEL WITH GFR   Recheck labs  8. Insomnia, unspecified type  G47.00 traZODone (DESYREL) 50 MG tablet   Well-controlled with trazodone 50 mg daily at bedtime  9. Rhinosinusitis  J31.0    J32.9    2 months of symptoms, add antihistamine to other management still has swelling redness nasal discharge postnasal drip and cough  10. Acute bronchitis, unspecified organism  J20.9  benzonatate (TESSALON) 100 MG capsule    albuterol (VENTOLIN HFA) 108 (90 Base) MCG/ACT inhaler    predniSONE (DELTASONE) 20 MG tablet   Recurrent cough tightness burning and wheeze for the past 2 months, eval 3x, lungs clear today, tx nasal sx, do steroids and inhaler if any worsening (hold)  11. Neuropathy  G62.9    managed by neurology-[facility] - on gabapentin 300 mg TID, sx much better controlled, no changes, she has f/up in May  12. Peripheral edema  R60.9    improved - chronic mild left ankle swelling, no pretibial or pedal edema  13. Low serum vitamin B12  E53.8    finished B12 injections with specialists, last B12 level was high  14. Need for vaccination  Z23 Pneumococcal polysaccharide vaccine 23-valent greater than or equal to 2yo subcutaneous/IM  15. Need for influenza vaccination  Z23 Flu Vaccine QUAD High Dose(Fluad)  16. Encounter for medication monitoring  Z51.81 CBC with  Differential/Platelet    COMPLETE METABOLIC PANEL WITH GFR    Lipid panel    Hemoglobin A1c   Greater than 50% of this visit was spent in direct face-to-face counseling, obtaining history and physical, discussing and educating pt on treatment plan.  Total time of this visit was greater than 45 min - due to multiple chronic conditions, reviewing management medications by several specialist and with multiple acute complaints with 3 recent acute visits and unresolved symptoms.  Remainder of time involved but was not limited to reviewing chart (recent and pertinent OV notes and labs), documentation in EMR, and coordinating care and treatment plan.   Return in about 6 months (around 01/01/2021) for Routine follow-up.   Delsa Grana, PA-C 07/03/20 10:29 AM

## 2020-07-04 LAB — LIPID PANEL
Cholesterol: 125 mg/dL (ref <200)
HDL: 50 mg/dL (ref >=50)
LDL Cholesterol (Calc): 55 mg/dL (calc)
Non-HDL Cholesterol (Calc): 75 mg/dL (calc) (ref <130)
Total CHOL/HDL Ratio: 2.5 (calc) (ref <5.0)
Triglycerides: 122 mg/dL (ref <150)

## 2020-07-04 LAB — COMPLETE METABOLIC PANEL WITH GFR
AG Ratio: 1.5 (calc) (ref 1.0–2.5)
ALT: 19 U/L (ref 6–29)
AST: 19 U/L (ref 10–35)
Albumin: 4.1 g/dL (ref 3.6–5.1)
Alkaline phosphatase (APISO): 43 U/L (ref 37–153)
BUN: 16 mg/dL (ref 7–25)
CO2: 28 mmol/L (ref 20–32)
Calcium: 9 mg/dL (ref 8.6–10.4)
Chloride: 107 mmol/L (ref 98–110)
Creat: 0.84 mg/dL (ref 0.50–0.99)
GFR, Est African American: 83 mL/min/{1.73_m2} (ref >=60)
GFR, Est Non African American: 71 mL/min/{1.73_m2} (ref >=60)
Globulin: 2.7 g/dL (calc) (ref 1.9–3.7)
Glucose, Bld: 129 mg/dL — ABNORMAL HIGH (ref 65–99)
Potassium: 4.1 mmol/L (ref 3.5–5.3)
Sodium: 142 mmol/L (ref 135–146)
Total Bilirubin: 0.5 mg/dL (ref 0.2–1.2)
Total Protein: 6.8 g/dL (ref 6.1–8.1)

## 2020-07-04 LAB — CBC WITH DIFFERENTIAL/PLATELET
Absolute Monocytes: 884 cells/uL (ref 200–950)
Basophils Absolute: 77 cells/uL (ref 0–200)
Basophils Relative: 0.9 %
Eosinophils Absolute: 502 cells/uL — ABNORMAL HIGH (ref 15–500)
Eosinophils Relative: 5.9 %
HCT: 43.1 % (ref 35.0–45.0)
Hemoglobin: 14.3 g/dL (ref 11.7–15.5)
Lymphs Abs: 3111 cells/uL (ref 850–3900)
MCH: 30.9 pg (ref 27.0–33.0)
MCHC: 33.2 g/dL (ref 32.0–36.0)
MCV: 93.1 fL (ref 80.0–100.0)
MPV: 9.7 fL (ref 7.5–12.5)
Monocytes Relative: 10.4 %
Neutro Abs: 3927 cells/uL (ref 1500–7800)
Neutrophils Relative %: 46.2 %
Platelets: 243 10*3/uL (ref 140–400)
RBC: 4.63 10*6/uL (ref 3.80–5.10)
RDW: 12.4 % (ref 11.0–15.0)
Total Lymphocyte: 36.6 %
WBC: 8.5 10*3/uL (ref 3.8–10.8)

## 2020-07-04 LAB — HEMOGLOBIN A1C
Hgb A1c MFr Bld: 6.2 % of total Hgb — ABNORMAL HIGH (ref <5.7)
Mean Plasma Glucose: 131 mg/dL
eAG (mmol/L): 7.3 mmol/L

## 2020-08-03 ENCOUNTER — Other Ambulatory Visit: Payer: Self-pay

## 2020-08-03 ENCOUNTER — Ambulatory Visit (INDEPENDENT_AMBULATORY_CARE_PROVIDER_SITE_OTHER): Payer: Medicare HMO

## 2020-08-03 VITALS — BP 122/78 | HR 80 | Temp 98.1°F | Resp 16 | Ht 64.0 in | Wt 219.5 lb

## 2020-08-03 DIAGNOSIS — Z780 Asymptomatic menopausal state: Secondary | ICD-10-CM | POA: Diagnosis not present

## 2020-08-03 DIAGNOSIS — Z1231 Encounter for screening mammogram for malignant neoplasm of breast: Secondary | ICD-10-CM | POA: Diagnosis not present

## 2020-08-03 DIAGNOSIS — Z0000 Encounter for general adult medical examination without abnormal findings: Secondary | ICD-10-CM | POA: Diagnosis not present

## 2020-08-03 NOTE — Patient Instructions (Signed)
Jacqueline [name] , Thank you for taking time to come for your Medicare Wellness Visit. I appreciate your ongoing commitment to your health goals. Please review the following plan we discussed and let me know if I can assist you in the future.   Screening recommendations/referrals: Colonoscopy: done 08/14/12. Repeat in 2024 Mammogram: done 01/07/20. Please call [phone] to schedule your mammogram and bone density screening.  Bone Density: done 12/22/18 Recommended yearly ophthalmology/optometry visit for glaucoma screening and checkup Recommended yearly dental visit for hygiene and checkup  Vaccinations: Influenza vaccine: done 07/03/20 Pneumococcal vaccine: done 07/03/20 Tdap vaccine: due Shingles vaccine: Shingrix discussed. Please contact your pharmacy for coverage information.  Covid-19: done 03/23/20 & 04/13/20  Advanced directives: Advance directive discussed with you today. I have provided a copy for you to complete at home and have notarized. Once this is complete please bring a copy in to our office so we can scan it into your chart.  Conditions/risks identified: Recommend healthy eating and physical activity for desired weight loss.   Next appointment: Follow up in one year for your annual wellness visit    Preventive Care 65 Years and Older, Female Preventive care refers to lifestyle choices and visits with your health care provider that can promote health and wellness. What does preventive care include?  A yearly physical exam. This is also called an annual well check.  Dental exams once or twice a year.  Routine eye exams. Ask your health care provider how often you should have your eyes checked.  Personal lifestyle choices, including:  Daily care of your teeth and gums.  Regular physical activity.  Eating a healthy diet.  Avoiding tobacco and drug use.  Limiting alcohol use.  Practicing safe sex.  Taking low-dose aspirin every day.  Taking vitamin and mineral  supplements as recommended by your health care provider. What happens during an annual well check? The services and screenings done by your health care provider during your annual well check will depend on your age, overall health, lifestyle risk factors, and family history of disease. Counseling  Your health care provider may ask you questions about your:  Alcohol use.  Tobacco use.  Drug use.  Emotional well-being.  Home and relationship well-being.  Sexual activity.  Eating habits.  History of falls.  Memory and ability to understand (cognition).  Work and work Statistician.  Reproductive health. Screening  You may have the following tests or measurements:  Height, weight, and BMI.  Blood pressure.  Lipid and cholesterol levels. These may be checked every 5 years, or more frequently if you are over 8 years old.  Skin check.  Lung cancer screening. You may have this screening every year starting at age 44 if you have a 30-pack-year history of smoking and currently smoke or have quit within the past 15 years.  Fecal occult blood test (FOBT) of the stool. You may have this test every year starting at age 41.  Flexible sigmoidoscopy or colonoscopy. You may have a sigmoidoscopy every 5 years or a colonoscopy every 10 years starting at age 17.  Hepatitis C blood test.  Hepatitis B blood test.  Sexually transmitted disease (STD) testing.  Diabetes screening. This is done by checking your blood sugar (glucose) after you have not eaten for a while (fasting). You may have this done every 1-3 years.  Bone density scan. This is done to screen for osteoporosis. You may have this done starting at age 53.  Mammogram. This may be done every  1-2 years. Talk to your health care provider about how often you should have regular mammograms. Talk with your health care provider about your test results, treatment options, and if necessary, the need for more tests. Vaccines  Your  health care provider may recommend certain vaccines, such as:  Influenza vaccine. This is recommended every year.  Tetanus, diphtheria, and acellular pertussis (Tdap, Td) vaccine. You may need a Td booster every 10 years.  Zoster vaccine. You may need this after age 18.  Pneumococcal 13-valent conjugate (PCV13) vaccine. One dose is recommended after age 43.  Pneumococcal polysaccharide (PPSV23) vaccine. One dose is recommended after age 18. Talk to your health care provider about which screenings and vaccines you need and how often you need them. This information is not intended to replace advice given to you by your health care provider. Make sure you discuss any questions you have with your health care provider. Document Released: 07/21/2015 Document Revised: 03/13/2016 Document Reviewed: 04/25/2015 Elsevier Interactive Patient Education  2017 [place] Prevention in the Home Falls can cause injuries. They can happen to people of all ages. There are many things you can do to make your home safe and to help prevent falls. What can I do on the outside of my home?  Regularly fix the edges of walkways and driveways and fix any cracks.  Remove anything that might make you trip as you walk through a door, such as a raised step or threshold.  Trim any bushes or trees on the path to your home.  Use bright outdoor lighting.  Clear any walking paths of anything that might make someone trip, such as rocks or tools.  Regularly check to see if handrails are loose or broken. Make sure that both sides of any steps have handrails.  Any raised decks and porches should have guardrails on the edges.  Have any leaves, snow, or ice cleared regularly.  Use sand or salt on walking paths during winter.  Clean up any spills in your garage right away. This includes oil or grease spills. What can I do in the bathroom?  Use night lights.  Install grab bars by the toilet and in the tub and  shower. Do not use towel bars as grab bars.  Use non-skid mats or decals in the tub or shower.  If you need to sit down in the shower, use a plastic, non-slip stool.  Keep the floor dry. Clean up any water that spills on the floor as soon as it happens.  Remove soap buildup in the tub or shower regularly.  Attach bath mats securely with double-sided non-slip rug tape.  Do not have throw rugs and other things on the floor that can make you trip. What can I do in the bedroom?  Use night lights.  Make sure that you have a light by your bed that is easy to reach.  Do not use any sheets or blankets that are too big for your bed. They should not hang down onto the floor.  Have a firm chair that has side arms. You can use this for support while you get dressed.  Do not have throw rugs and other things on the floor that can make you trip. What can I do in the kitchen?  Clean up any spills right away.  Avoid walking on wet floors.  Keep items that you use a lot in easy-to-reach places.  If you need to reach something above you, use a strong  step stool that has a grab bar.  Keep electrical cords out of the way.  Do not use floor polish or wax that makes floors slippery. If you must use wax, use non-skid floor wax.  Do not have throw rugs and other things on the floor that can make you trip. What can I do with my stairs?  Do not leave any items on the stairs.  Make sure that there are handrails on both sides of the stairs and use them. Fix handrails that are broken or loose. Make sure that handrails are as long as the stairways.  Check any carpeting to make sure that it is firmly attached to the stairs. Fix any carpet that is loose or worn.  Avoid having throw rugs at the top or bottom of the stairs. If you do have throw rugs, attach them to the floor with carpet tape.  Make sure that you have a light switch at the top of the stairs and the bottom of the stairs. If you do not  have them, ask someone to add them for you. What else can I do to help prevent falls?  Wear shoes that:  Do not have high heels.  Have rubber bottoms.  Are comfortable and fit you well.  Are closed at the toe. Do not wear sandals.  If you use a stepladder:  Make sure that it is fully opened. Do not climb a closed stepladder.  Make sure that both sides of the stepladder are locked into place.  Ask someone to hold it for you, if possible.  Clearly mark and make sure that you can see:  Any grab bars or handrails.  First and last steps.  Where the edge of each step is.  Use tools that help you move around (mobility aids) if they are needed. These include:  Canes.  Walkers.  Scooters.  Crutches.  Turn on the lights when you go into a dark area. Replace any light bulbs as soon as they burn out.  Set up your furniture so you have a clear path. Avoid moving your furniture around.  If any of your floors are uneven, fix them.  If there are any pets around you, be aware of where they are.  Review your medicines with your doctor. Some medicines can make you feel dizzy. This can increase your chance of falling. Ask your doctor what other things that you can do to help prevent falls. This information is not intended to replace advice given to you by your health care provider. Make sure you discuss any questions you have with your health care provider. Document Released: 04/20/2009 Document Revised: 11/30/2015 Document Reviewed: 07/29/2014 Elsevier Interactive Patient Education  2017 [employer].

## 2020-08-03 NOTE — Progress Notes (Signed)
Subjective:   Jacqueline [name] is a 70 y.o. female who presents for Medicare Annual (Subsequent) preventive examination.  Review of Systems     Cardiac Risk Factors include: advanced age (>27mn, >>39women);diabetes mellitus;dyslipidemia;hypertension;obesity (BMI >30kg/m2)     Objective:    Today's Vitals   08/03/20 1049  BP: 122/78  Pulse: 80  Resp: 16  Temp: 98.1 F (36.7 C)  TempSrc: Oral  SpO2: 96%  Weight: 219 lb 8 oz (99.6 kg)  Height: 5' 4"  (1.626 m)   Body mass index is 37.68 kg/m.  Advanced Directives 08/03/2020 07/30/2019 06/14/2019  Does Patient Have a Medical Advance Directive? No No No  Would patient like information on creating a medical advance directive? Yes (MAU/Ambulatory/Procedural Areas - Information given) Yes (MAU/Ambulatory/Procedural Areas - Information given) -    Current Medications (verified) Outpatient Encounter Medications as of 08/03/2020  Medication Sig  . aspirin 81 MG chewable tablet Chew 81 mg by mouth daily.  . blood glucose meter kit and supplies KIT Lifescan meter Check blood sugar 2 times daily as needed  . calcium carbonate (OS-CAL) 600 MG TABS tablet Take 1 tablet (600 mg total) by mouth 2 (two) times daily with a meal.  . Cholecalciferol (VITAMIN D) 50 MCG (2000 UT) CAPS Take by mouth.  . ezetimibe (ZETIA) 10 MG tablet Take 1 tablet (10 mg total) by mouth daily.  .Marland Kitchengabapentin (NEURONTIN) 300 MG capsule Take 300 mg by mouth 3 (three) times daily.  . Lancets (ONETOUCH DELICA PLUS [id number] MISC USE AS DIRECTED TWICE A DAY AS NEEDED  . levocetirizine (XYZAL) 5 MG tablet Take 1 tablet (5 mg total) by mouth every evening.  .Derrill MemoON 08/08/2020] lisinopril (ZESTRIL) 10 MG tablet Take 1 tablet (10 mg total) by mouth daily.  . Magnesium Oxide 500 MG CAPS Take 1 capsule by mouth daily.  . metoprolol tartrate (LOPRESSOR) 25 MG tablet Take 1 tablet (25 mg total) by mouth 2 (two) times daily.  .Glory RosebushULTRA test strip USE AS DIRECTED  TWICE A DAY AS NEEDED  . Potassium Gluconate 595 MG CAPS Take 595 mg by mouth daily.  . rosuvastatin (CRESTOR) 10 MG tablet Take 1 tablet (10 mg total) by mouth daily.  . traZODone (DESYREL) 50 MG tablet Take 1 tablet (50 mg total) by mouth at bedtime.  .Marland KitchenVITAMIN E PO Take 800 Units by mouth daily.  .Marland Kitchenzinc gluconate 50 MG tablet Take 50 mg by mouth daily.  . [DISCONTINUED] albuterol (VENTOLIN HFA) 108 (90 Base) MCG/ACT inhaler Inhale 2 puffs into the lungs every 4 (four) hours as needed for wheezing or shortness of breath. (Patient not taking: Reported on 08/03/2020)  . [DISCONTINUED] benzonatate (TESSALON) 100 MG capsule Take 1 capsule (100 mg total) by mouth 3 (three) times daily as needed for cough.   No facility-administered encounter medications on file as of 08/03/2020.    Allergies (verified) Patient has no known allergies.   History: Past Medical History:  Diagnosis Date  . Calcium oxalate renal stones   . [place]   . Hyperlipidemia   . Hypertension   . MVP (mitral valve prolapse)    Past Surgical History:  Procedure Laterality Date  . BREAST BIOPSY Right 08/19/2012   neg, Dr. BBary Castilla . extraction of renal stones    . TONSILLECTOMY AND ADENOIDECTOMY    . VAGINAL HYSTERECTOMY  1980   class IV, ovaries intact   Family History  Problem Relation Age of Onset  .  Hypertension Mother   . Dementia Mother   . Stroke Mother   . Cancer - Other Father        Bile Duct  . Heart disease Maternal Grandmother   . Heart disease Maternal Grandfather   . Hypertension Paternal Grandmother   . Cancer Maternal Aunt        breast and lung  . Stroke Paternal Grandfather    Social History   Socioeconomic History  . Marital status: Married    Spouse name: Richardson Landry  . Number of children: 2  . Years of education: Not on file  . Highest education level: Some college, no degree  Occupational History  . Occupation: Naval architect  Tobacco Use   . Smoking status: Former Smoker    Packs/day: 0.25    Years: 30.00    Pack years: 7.50    Types: Cigarettes    Start date: 07/09/1983    Quit date: 05/27/2014    Years since quitting: 6.1  . Smokeless tobacco: Never Used  Vaping Use  . Vaping Use: Never used  Substance and Sexual Activity  . Alcohol use: Yes    Alcohol/week: 1.0 standard drink    Types: 1 Glasses of wine per week    Comment: occasional  . Drug use: No  . Sexual activity: Yes    Partners: Male  Other Topics Concern  . Not on file  Social History Narrative  . Not on file   Social Determinants of Health   Financial Resource Strain: Low Risk   . Difficulty of Paying Living Expenses: Not hard at all  Food Insecurity: No Food Insecurity  . Worried About Charity fundraiser in the Last Year: Never true  . Ran Out of Food in the Last Year: Never true  Transportation Needs: No Transportation Needs  . Lack of Transportation (Medical): No  . Lack of Transportation (Non-Medical): No  Physical Activity: Inactive  . Days of Exercise per Week: 0 days  . Minutes of Exercise per Session: 0 min  Stress: No Stress Concern Present  . Feeling of Stress : Not at all  Social Connections: Socially Integrated  . Frequency of Communication with Friends and Family: More than three times a week  . Frequency of Social Gatherings with Friends and Family: More than three times a week  . Attends Religious Services: More than 4 times per year  . Active Member of Clubs or Organizations: Yes  . Attends Archivist Meetings: More than 4 times per year  . Marital Status: Married    Tobacco Counseling Counseling given: Not Answered   Clinical Intake:  Pre-visit preparation completed: Yes  Pain : No/denies pain     BMI - recorded: 37.68 Nutritional Status: BMI > 30  Obese Nutritional Risks: None Diabetes: Yes CBG done?: No Did pt. bring in CBG monitor from home?: No  How often do you need to have someone help you  when you read instructions, pamphlets, or other written materials from your doctor or pharmacy?: 1 - Never  Nutrition Risk Assessment:  Has the patient had any N/V/D within the last 2 months?  No  Does the patient have any non-healing wounds?  No  Has the patient had any unintentional weight loss or weight gain?  No   Diabetes:  Is the patient diabetic?  Yes  If diabetic, was a CBG obtained today?  No  Did the patient bring in their glucometer from home?  No  How often do you  monitor your CBG's? 1-2 times per month.   Financial Strains and Diabetes Management:  Are you having any financial strains with the device, your supplies or your medication? No .  Does the patient want to be seen by Chronic Care Management for management of their diabetes?  No  Would the patient like to be referred to a Nutritionist or for Diabetic Management?  No   Diabetic Exams:  Diabetic Eye Exam: Completed 07/29/19 negative retinopathy. Overdue for diabetic eye exam. Pt has been advised about the importance in completing this exam. A referral has been placed today.  Diabetic Foot Exam: Completed 07/03/20.    Interpreter Needed?: No  Information entered by :: Clemetine Marker LPN   Activities of Daily Living In your present state of health, do you have any difficulty performing the following activities: 08/03/2020 07/03/2020  Hearing? N N  Comment declines hearing aids -  Vision? N N  Difficulty concentrating or making decisions? N N  Walking or climbing stairs? N N  Dressing or bathing? N N  Doing errands, shopping? N -  Preparing Food and eating ? N -  Using the Toilet? N -  In the past six months, have you accidently leaked urine? Y -  Comment wears pads occasionallly for protection -  Do you have problems with loss of bowel control? N -  Managing your Medications? N -  Managing your Finances? N -  Housekeeping or managing your Housekeeping? N -  Some recent data might be hidden    Patient  Care Team: Delsa Grana, PA-C as PCP - General (Family Medicine) Bary Castilla, Forest Gleason, MD (General Surgery) Germaine Pomfret, [facility] as Pharmacist (Pharmacist)  Indicate any recent Medical Services you may have received from other than [facility] providers in the past year (date may be approximate).     Assessment:   This is a routine wellness examination for Analiese.  Hearing/Vision screen  Hearing Screening   125Hz  250Hz  500Hz  1000Hz  2000Hz  3000Hz  4000Hz  6000Hz  8000Hz   Right ear:           Left ear:           Comments: Pt denies hearing difficulty  Vision Screening Comments: Annual vision screenings done at [facility]  Dietary issues and exercise activities discussed: Current Exercise Habits: The patient does not participate in regular exercise at present, Exercise limited by: neurologic condition(s)  Goals    . Chronic Care Management     CARE PLAN ENTRY (see longitudinal plan of care for additional care plan information)  Current Barriers:  . Chronic Disease Management support, education, and care coordination needs related to Hypertension, Hyperlipidemia, Diabetes, Chronic Kidney Disease, Osteopenia, and Insomnia   Hypertension BP Readings from Last 3 Encounters:  12/28/19 118/76  09/23/19 122/70  07/30/19 126/74   . Pharmacist Clinical Goal(s): o Over the next 90 days, patient will work with PharmD and providers to maintain BP goal <130/80 . Current regimen:  o Lisinopril 53m daily o Metoprolol Tartrate 275mtwice daily . Interventions: o None . Patient self care activities - Over the next 90 days, patient will: o Check BP weekly, document, and provide at future appointments o Ensure daily salt intake < 2300 mg/day  Hyperlipidemia Lab Results  Component Value Date/Time   LDLCALC 49 09/23/2019 10:47 AM   LDLDIRECT 149.5 12/25/2006 08:51 AM   . Pharmacist Clinical Goal(s): o Over the next 90 days, patient will work with PharmD and providers to maintain LDL  goal < 70 .  Current regimen:  o Rosuvastatin 58m daily o Ezetimibe 10 mg daily  . Interventions: o None . Patient self care activities - Over the next 90 days, patient will: o Continue current lifestyle modifications and practices  Diabetes Lab Results  Component Value Date/Time   HGBA1C 5.7 (H) 09/23/2019 10:47 AM   HGBA1C 5.8 (H) 06/25/2019 12:00 AM   . Pharmacist Clinical Goal(s): o Over the next 90 days, patient will work with PharmD and providers to maintain A1c goal <7% . Current regimen:  o None . Interventions: o None . Patient self care activities - Over the next 90 days, patient will: o Check blood sugar once daily, document, and provide at future appointments o Contact provider with any episodes of hypoglycemia  Medication management . Pharmacist Clinical Goal(s): o Over the next 90 days, patient will work with PharmD and providers to maintain optimal medication adherence . Current pharmacy: [facility] . Interventions o Comprehensive medication review performed. o Continue current medication management strategy . Patient self care activities - Over the next 90 days, patient will: o Focus on medication adherence by continuing current practices o Take medications as prescribed o Report any questions or concerns to PharmD and/or provider(s)    . Weight (lb) < 200 lb (90.7 kg)     Pt would like to lose weight over the next year with healthy eating and physical activity       Depression Screen PHQ 2/9 Scores 08/03/2020 07/03/2020 06/26/2020 12/28/2019 09/23/2019 07/30/2019 06/25/2019  PHQ - 2 Score 0 0 0 0 0 0 0  PHQ- 9 Score - - - 0 0 - 0    Fall Risk Fall Risk  08/03/2020 07/03/2020 06/26/2020 12/28/2019 09/23/2019  Falls in the past year? 0 0 0 0 1  Number falls in past yr: 0 0 0 0 1  Comment - - - - -  Injury with Fall? 0 0 0 0 0  Comment - - - - -  Risk for fall due to : No Fall Risks - - - -  Risk for fall due to: Comment - - - - -  Follow up Falls prevention  discussed Falls evaluation completed - Falls evaluation completed -    FALL RISK PREVENTION PERTAINING TO THE HOME:   Any stairs in or around the home? Yes  If so, are there any without handrails? Yes  3 steps outside  Home free of loose throw rugs in walkways, pet beds, electrical cords, etc? Yes  Adequate lighting in your home to reduce risk of falls? Yes   ASSISTIVE DEVICES UTILIZED TO PREVENT FALLS:  Life alert? No  Use of a cane, walker or w/c? No  Grab bars in the bathroom? No  Shower chair or bench in shower? Yes  Elevated toilet seat or a handicapped toilet? Yes   TIMED UP AND GO:  Was the test performed? Yes .  Length of time to ambulate 10 feet: 5 sec.   Gait steady and fast without use of assistive device  Cognitive Function: Normal cognitive status assessed by direct observation by this Nurse Health Advisor. No abnormalities found.       6CIT Screen 07/30/2019  What Year? 0 points  What month? 0 points  What time? 0 points  Count back from 20 0 points  Months in reverse 0 points  Repeat phrase 0 points  Total Score 0    Immunizations Immunization History  Administered Date(s) Administered  . Fluad Quad(high Dose 65+) 07/03/2020  .  Influenza,inj,Quad PF,6+ Mos 07/30/2018  . Influenza-Unspecified 07/30/2018  . PFIZER(Purple Top)SARS-COV-2 Vaccination 03/23/2020, 04/13/2020  . Pneumococcal Polysaccharide-23 07/03/2020  . Td 12/27/1997, 11/27/2009  . Zoster 10/27/2013    TDAP status: Due, Education has been provided regarding the importance of this vaccine. Advised may receive this vaccine at local pharmacy or Health Dept. Aware to provide a copy of the vaccination record if obtained from local pharmacy or Health Dept. Verbalized acceptance and understanding.  Flu Vaccine status: Up to date  Pneumococcal vaccine status: Up to date  Covid-19 vaccine status: Completed vaccines  Qualifies for Shingles Vaccine? Yes   Zostavax completed Yes   Shingrix  Completed?: No.    Education has been provided regarding the importance of this vaccine. Patient has been advised to call insurance company to determine out of pocket expense if they have not yet received this vaccine. Advised may also receive vaccine at local pharmacy or Health Dept. Verbalized acceptance and understanding.  Screening Tests Health Maintenance  Topic Date Due  . TETANUS/TDAP  11/28/2019  . OPHTHALMOLOGY EXAM  07/28/2020  . COVID-19 Vaccine (3 - Pfizer risk 4-dose series) 08/04/2020 (Originally 05/11/2020)  . DEXA SCAN  12/21/2020  . URINE MICROALBUMIN  12/27/2020  . HEMOGLOBIN A1C  01/01/2021  . MAMMOGRAM  01/06/2021  . FOOT EXAM  07/03/2021  . PNA vac Low Risk Adult (2 of 2 - PCV13) 07/03/2021  . COLONOSCOPY (Pts 45-75yr Insurance coverage will need to be confirmed)  08/14/2022  . INFLUENZA VACCINE  Completed  . Hepatitis C Screening  Completed    Health Maintenance  Health Maintenance Due  Topic Date Due  . TETANUS/TDAP  11/28/2019  . OPHTHALMOLOGY EXAM  07/28/2020    Colorectal cancer screening: Type of screening: Colonoscopy. Completed 08/14/12. Repeat every 10 years  Mammogram status: Completed 01/07/20. Repeat every year  Bone Density status: Completed 12/22/18. Results reflect: Bone density results: OSTEOPENIA. Repeat every 2 years.  Lung Cancer Screening: (Low Dose CT Chest recommended if Age 69-80years, 30 pack-year currently smoking OR have quit w/in 15years.) does not qualify.   Additional Screening:  Hepatitis C Screening: does qualify; Completed 04/05/19  Vision Screening: Recommended annual ophthalmology exams for early detection of glaucoma and other disorders of the eye. Is the patient up to date with their annual eye exam?  Yes  Who is the provider or what is the name of the office in which the patient attends annual eye exams? [facility]: Recommended annual dental exams for proper oral hygiene  Community Resource  Referral / Chronic Care Management: CRR required this visit?  No   CCM required this visit?  No      Plan:     I have personally reviewed and noted the following in the patient's chart:   . Medical and social history . Use of alcohol, tobacco or illicit drugs  . Current medications and supplements . Functional ability and status . Nutritional status . Physical activity . Advanced directives . List of other physicians . Hospitalizations, surgeries, and ER visits in previous 12 months . Vitals . Screenings to include cognitive, depression, and falls . Referrals and appointments  In addition, I have reviewed and discussed with patient certain preventive protocols, quality metrics, and best practice recommendations. A written personalized care plan for preventive services as well as general preventive health recommendations were provided to patient.     KClemetine Marker LPN   [id number]  Nurse Notes: none

## 2020-08-12 NOTE — Progress Notes (Unsigned)
Cardiology Office Note  Date:  08/14/2020   ID:  Jacqu[name], DOB [birth year], MRN [id number]  PCP:  Delsa Grana, PA-C   Chief Complaint  Patient presents with  . Follow-up    Annual follow up. Medications verbally reviewed with patient.     HPI:  [name] is a 70 year old woman with past medical history of Hypertension Hyperlipidemia Borderline diabetes Former smoker, quit 2013 Seen in the emergency room yesterday for hypertension She presents today for paroxysmal tachycardia, hypertension  Feels well,  No palpitations or tachycardia Reports feeling well on metoprolol with minimal breakthrough  Weight up  No regular exercise program  Hemoglobin A1c 6.2 Total cholesterol 125, LDL 55  EKG personally reviewed by myself on todays visit Shows normal sinus rhythm rate 73 bpm no significant ST-T wave changes   Event monitor, reviewed Sinus rhythm  5  Supraventricular Tachycardia runs  the longest lasting 6 beats with an avg rate of 125 bpm.  Patient triggered events associated with symptomatic PVCs  Previous trip to the emergency room for tachycardia prior to starting metoprolol  PMH:   has a past medical history of Calcium oxalate renal stones, Diabetes mellitus without complication ([place]), Hyperlipidemia, Hypertension, and MVP (mitral valve prolapse).  PSH:    Past Surgical History:  Procedure Laterality Date  . BREAST BIOPSY Right 08/19/2012   neg, Dr. Bary Castilla  . extraction of renal stones    . TONSILLECTOMY AND ADENOIDECTOMY    . VAGINAL HYSTERECTOMY  1980   class IV, ovaries intact    Current Outpatient Medications  Medication Sig Dispense Refill  . aspirin 81 MG chewable tablet Chew 81 mg by mouth daily.    . blood glucose meter kit and supplies KIT Lifescan meter Check blood sugar 2 times daily as needed 1 each 0  . calcium carbonate (OS-CAL) 600 MG TABS tablet Take 1 tablet (600 mg total) by mouth 2 (two) times daily with a meal. 30 tablet 0   . Cholecalciferol (VITAMIN D) 50 MCG (2000 UT) CAPS Take by mouth. 30 capsule   . ezetimibe (ZETIA) 10 MG tablet Take 1 tablet (10 mg total) by mouth daily. 90 tablet 3  . gabapentin (NEURONTIN) 300 MG capsule Take 300 mg by mouth 3 (three) times daily.    . Lancets (ONETOUCH DELICA PLUS [id number]) MISC USE AS DIRECTED TWICE A DAY AS NEEDED 200 each 0  . levocetirizine (XYZAL) 5 MG tablet Take 1 tablet (5 mg total) by mouth every evening. 30 tablet 2  . lisinopril (ZESTRIL) 10 MG tablet Take 1 tablet (10 mg total) by mouth daily. 90 tablet 3  . Magnesium Oxide 500 MG CAPS Take 1 capsule by mouth daily.    Glory Rosebush ULTRA test strip USE AS DIRECTED TWICE A DAY AS NEEDED 200 strip 1  . Potassium Gluconate 595 MG CAPS Take 595 mg by mouth daily.    . rosuvastatin (CRESTOR) 10 MG tablet Take 1 tablet (10 mg total) by mouth daily. 90 tablet 3  . traZODone (DESYREL) 50 MG tablet Take 1 tablet (50 mg total) by mouth at bedtime. 90 tablet 3  . VITAMIN E PO Take 800 Units by mouth daily.    Marland Kitchen zinc gluconate 50 MG tablet Take 50 mg by mouth daily.    . metoprolol tartrate (LOPRESSOR) 25 MG tablet Take 1 tablet (25 mg total) by mouth 2 (two) times daily. 180 tablet 3   No current facility-administered medications for this visit.  Allergies:   Patient has no known allergies.   Social History:  The patient  reports that she quit smoking about 6 years ago. Her smoking use included cigarettes. She started smoking about 37 years ago. She has a 7.50 pack-year smoking history. She has never used smokeless tobacco. She reports current alcohol use of about 1.0 standard drink of alcohol per week. She reports that she does not use drugs.   Family History:   family history includes Cancer in her maternal aunt; Cancer - Other in her father; Dementia in her mother; Heart disease in her maternal grandfather and maternal grandmother; Hypertension in her mother and paternal grandmother; Stroke in her mother and  paternal grandfather.    Review of Systems: Review of Systems  Constitutional: Negative.   HENT: Negative.   Respiratory: Negative.   Cardiovascular: Negative.   Gastrointestinal: Negative.   Musculoskeletal: Negative.   Neurological: Negative.   Psychiatric/Behavioral: Negative.   All other systems reviewed and are negative.   PHYSICAL EXAM: VS:  BP 122/72 (BP Location: Left Arm, Patient Position: Sitting, Cuff Size: Normal)   Pulse 73   Ht 5' 4"  (1.626 m)   Wt 220 lb (99.8 kg)   SpO2 96%   BMI 37.76 kg/m  , BMI Body mass index is 37.76 kg/m. Constitutional:  oriented to person, place, and time. No distress.  HENT:  Head: Grossly normal Eyes:  no discharge. No scleral icterus.  Neck: No JVD, no carotid bruits  Cardiovascular: Regular rate and rhythm, no murmurs appreciated Pulmonary/Chest: Clear to auscultation bilaterally, no wheezes or rails Abdominal: Soft.  no distension.  no tenderness.  Musculoskeletal: Normal range of motion Neurological:  normal muscle tone. Coordination normal. No atrophy Skin: Skin warm and dry Psychiatric: normal affect, pleasant   Recent Labs: 12/28/2019: TSH 1.63 07/03/2020: ALT 19; BUN 16; Creat 0.84; Hemoglobin 14.3; Platelets 243; Potassium 4.1; Sodium 142    Lipid Panel Lab Results  Component Value Date   CHOL 125 07/03/2020   HDL 50 07/03/2020   LDLCALC 55 07/03/2020   TRIG 122 07/03/2020    Wt Readings from Last 3 Encounters:  08/14/20 220 lb (99.8 kg)  08/03/20 219 lb 8 oz (99.6 kg)  07/03/20 213 lb 12.8 oz (97 kg)     ASSESSMENT AND PLAN:  Problem List Items Addressed This Visit      Cardiology Problems   HLD (hyperlipidemia)   Relevant Medications   metoprolol tartrate (LOPRESSOR) 25 MG tablet   Essential hypertension   Relevant Medications   metoprolol tartrate (LOPRESSOR) 25 MG tablet   Other Relevant Orders   EKG 12-Lead    Other Visit Diagnoses    Paroxysmal tachycardia (HCC)    -  Primary   Relevant  Medications   metoprolol tartrate (LOPRESSOR) 25 MG tablet   Other Relevant Orders   EKG 12-Lead     SVT, PVCs No arrhythmia, Continue metoprolol  Neuropathy Managed by primary care  Hypertension Blood pressure is well controlled on today's visit. No changes made to the medications.  Hyperlipidemia Cholesterol is at goal on the current lipid regimen. No changes to the medications were made.  Obesity We have encouraged continued exercise, careful diet management in an effort to lose weight.     Total encounter time more than 25 minutes  Greater than 50% was spent in counseling and coordination of care with the patient   Signed, Esmond Plants, M.D., Ph.D. [place], [place]

## 2020-08-14 ENCOUNTER — Encounter: Payer: Self-pay | Admitting: Cardiovascular Disease

## 2020-08-14 ENCOUNTER — Ambulatory Visit: Payer: Medicare HMO | Admitting: Cardiovascular Disease

## 2020-08-14 ENCOUNTER — Other Ambulatory Visit: Payer: Self-pay

## 2020-08-14 VITALS — BP 122/72 | HR 73 | Ht 64.0 in | Wt 220.0 lb

## 2020-08-14 DIAGNOSIS — E785 Hyperlipidemia, unspecified: Secondary | ICD-10-CM | POA: Diagnosis not present

## 2020-08-14 DIAGNOSIS — I479 Paroxysmal tachycardia, unspecified: Secondary | ICD-10-CM | POA: Diagnosis not present

## 2020-08-14 DIAGNOSIS — I10 Essential (primary) hypertension: Secondary | ICD-10-CM

## 2020-08-14 MED ORDER — METOPROLOL TARTRATE 25 MG PO TABS: 25.0000 mg | ORAL_TABLET | Freq: Two times a day (BID) | ORAL | 3 refills | Status: DC

## 2020-08-14 NOTE — Patient Instructions (Signed)
Medication Instructions:  No changes  If you need a refill on your cardiac medications before your next appointment, please call your pharmacy.    Lab work: No new labs needed   If you have labs (blood work) drawn today and your tests are completely normal, you will receive your results only by: . MyChart Message (if you have MyChart) OR . A paper copy in the mail If you have any lab test that is abnormal or we need to change your treatment, we will call you to review the results.   Testing/Procedures: No new testing needed   Follow-Up: At [facility], you and your health needs are our priority.  As part of our continuing mission to provide you with exceptional heart care, we have created designated Provider Care Teams.  These Care Teams include your primary Cardiologist (physician) and Advanced Practice Providers (APPs -  Physician Assistants and Nurse Practitioners) who all work together to provide you with the care you need, when you need it.  . You will need a follow up appointment in 12 months  . Providers on your designated Care Team:   . Christopher Berge, NP . Ryan Dunn, PA-C . Jacquelyn Visser, PA-C  Any Other Special Instructions Will Be Listed Below (If Applicable).  COVID-19 Vaccine Information can be found at: [email or web address] For questions related to vaccine distribution or appointments, please email [email or web address] or call [phone].     

## 2020-09-23 ENCOUNTER — Other Ambulatory Visit: Payer: Self-pay | Admitting: Family Medicine

## 2020-09-23 DIAGNOSIS — I10 Essential (primary) hypertension: Secondary | ICD-10-CM

## 2020-10-02 ENCOUNTER — Other Ambulatory Visit: Payer: Self-pay

## 2020-10-02 ENCOUNTER — Ambulatory Visit: Payer: Medicare HMO | Admitting: Dermatology

## 2020-10-02 DIAGNOSIS — D225 Melanocytic nevi of trunk: Secondary | ICD-10-CM | POA: Diagnosis not present

## 2020-10-02 DIAGNOSIS — L814 Other melanin hyperpigmentation: Secondary | ICD-10-CM

## 2020-10-02 DIAGNOSIS — Z1283 Encounter for screening for malignant neoplasm of skin: Secondary | ICD-10-CM | POA: Diagnosis not present

## 2020-10-02 DIAGNOSIS — L570 Actinic keratosis: Secondary | ICD-10-CM

## 2020-10-02 DIAGNOSIS — L821 Other seborrheic keratosis: Secondary | ICD-10-CM | POA: Diagnosis not present

## 2020-10-02 DIAGNOSIS — D229 Melanocytic nevi, unspecified: Secondary | ICD-10-CM

## 2020-10-02 DIAGNOSIS — L578 Other skin changes due to chronic exposure to nonionizing radiation: Secondary | ICD-10-CM

## 2020-10-02 NOTE — Patient Instructions (Addendum)

## 2020-10-02 NOTE — Progress Notes (Signed)
   Follow-Up Visit   Subjective  Jacqueline [name] is a 70 y.o. female who presents for the following: Annual Exam (Patient here for TBSE. No areas of concern today. No history of skin cancer.).   The following portions of the chart were reviewed this encounter and updated as appropriate:       Review of Systems:  No other skin or systemic complaints except as noted in HPI or Assessment and Plan.  Objective  Well appearing patient in no apparent distress; mood and affect are within normal limits.  A full examination was performed including scalp, head, eyes, ears, nose, lips, neck, chest, axillae, abdomen, back, buttocks, bilateral upper extremities, bilateral lower extremities, hands, feet, fingers, toes, fingernails, and toenails. All findings within normal limits unless otherwise noted below.  Objective  Left Flank: 4 x 2.5cm brown waxy plaque     Images    Objective  Left Mid Forearm: Erythematous thin papules/macules with gritty scale.   Objective  Left upper chest: 5.33mm pink flesh papule with focal waxy brown papules  Images     Assessment & Plan   Skin cancer screening performed today.   Actinic Damage - chronic, secondary to cumulative UV radiation exposure/sun exposure over time - diffuse scaly erythematous macules with underlying dyspigmentation - Recommend daily broad spectrum sunscreen SPF 30+ to sun-exposed areas, reapply every 2 hours as needed.  - Recommend staying in the shade or wearing long sleeves, sun glasses (UVA+UVB protection) and wide brim hats (4-inch brim around the entire circumference of the hat). - Call for new or changing lesions.  Seborrheic Keratoses - Stuck-on, waxy, tan-brown papules and/or plaques  - Benign-appearing - Discussed benign etiology and prognosis. - Observe - Call for any changes  Lentigines - Scattered tan macules - Due to sun exposure - Benign-appering, observe - Recommend daily broad spectrum sunscreen  SPF 30+ to sun-exposed areas, reapply every 2 hours as needed. - Call for any changes  Melanocytic Nevi - Tan-brown and/or pink-flesh-colored symmetric macules and papules - Benign appearing on exam today - Observation - Call clinic for new or changing moles - Recommend daily use of broad spectrum spf 30+ sunscreen to sun-exposed areas.   Seborrheic keratosis Left Flank  vs epidermal nevus  Benign, observe. Stable from previous visit. Present since childhood per patient.   AK (actinic keratosis) Left Mid Forearm  Prior to procedure, discussed risks of blister formation, small wound, skin dyspigmentation, or rare scar following cryotherapy.    Destruction of lesion - Left Mid Forearm  Destruction method: cryotherapy   Informed consent: discussed and consent obtained   Lesion destroyed using liquid nitrogen: Yes   Region frozen until ice ball extended beyond lesion: Yes   Outcome: patient tolerated procedure well with no complications   Post-procedure details: wound care instructions given    Nevus Left upper chest  vs ISK  Benign-appearing.  Observation.  Call clinic for new or changing moles.  Recommend daily use of broad spectrum spf 30+ sunscreen to sun-exposed areas. Recheck on f/up   Return in about 1 year (around 10/02/2021) for TBSE.  IJamesetta Orleans, CMA, am acting as scribe for [name], MD .  Documentation: I have reviewed the above documentation for accuracy and completeness, and I agree with the above.  [name] MD

## 2020-10-05 DIAGNOSIS — Z7722 Contact with and (suspected) exposure to environmental tobacco smoke (acute) (chronic): Secondary | ICD-10-CM | POA: Diagnosis not present

## 2020-10-05 DIAGNOSIS — G4700 Insomnia, unspecified: Secondary | ICD-10-CM | POA: Diagnosis not present

## 2020-10-05 DIAGNOSIS — E785 Hyperlipidemia, unspecified: Secondary | ICD-10-CM | POA: Diagnosis not present

## 2020-10-05 DIAGNOSIS — Z8249 Family history of ischemic heart disease and other diseases of the circulatory system: Secondary | ICD-10-CM | POA: Diagnosis not present

## 2020-10-05 DIAGNOSIS — I10 Essential (primary) hypertension: Secondary | ICD-10-CM | POA: Diagnosis not present

## 2020-10-05 DIAGNOSIS — Z87891 Personal history of nicotine dependence: Secondary | ICD-10-CM | POA: Diagnosis not present

## 2020-10-05 DIAGNOSIS — G629 Polyneuropathy, unspecified: Secondary | ICD-10-CM | POA: Diagnosis not present

## 2020-11-20 ENCOUNTER — Other Ambulatory Visit: Payer: Self-pay | Admitting: Cardiovascular Disease

## 2020-11-20 DIAGNOSIS — E785 Hyperlipidemia, unspecified: Secondary | ICD-10-CM

## 2020-11-20 DIAGNOSIS — I700 Atherosclerosis of aorta: Secondary | ICD-10-CM

## 2020-11-22 DIAGNOSIS — R200 Anesthesia of skin: Secondary | ICD-10-CM | POA: Diagnosis not present

## 2020-11-22 DIAGNOSIS — G629 Polyneuropathy, unspecified: Secondary | ICD-10-CM | POA: Diagnosis not present

## 2020-11-22 DIAGNOSIS — R202 Paresthesia of skin: Secondary | ICD-10-CM | POA: Diagnosis not present

## 2020-11-22 DIAGNOSIS — R252 Cramp and spasm: Secondary | ICD-10-CM | POA: Diagnosis not present

## 2020-11-23 ENCOUNTER — Other Ambulatory Visit: Payer: Self-pay | Admitting: Cardiovascular Disease

## 2020-11-23 DIAGNOSIS — I700 Atherosclerosis of aorta: Secondary | ICD-10-CM

## 2020-11-23 DIAGNOSIS — E785 Hyperlipidemia, unspecified: Secondary | ICD-10-CM

## 2020-11-23 NOTE — Telephone Encounter (Signed)
Please advise if ok to refill Zetia 10 mg qd. Zetia last filled by PCP.

## 2020-11-24 ENCOUNTER — Other Ambulatory Visit: Payer: Self-pay | Admitting: *Deleted

## 2020-11-24 DIAGNOSIS — I700 Atherosclerosis of aorta: Secondary | ICD-10-CM

## 2020-11-24 DIAGNOSIS — E785 Hyperlipidemia, unspecified: Secondary | ICD-10-CM

## 2020-11-24 MED ORDER — EZETIMIBE 10 MG PO TABS: 10.0000 mg | ORAL_TABLET | Freq: Every day | ORAL | 3 refills | Status: DC

## 2020-12-18 DIAGNOSIS — E119 Type 2 diabetes mellitus without complications: Secondary | ICD-10-CM | POA: Diagnosis not present

## 2020-12-18 LAB — HM DIABETES EYE EXAM

## 2020-12-20 ENCOUNTER — Telehealth: Payer: Self-pay

## 2020-12-20 NOTE — Chronic Care Management (AMB) (Signed)
    Chronic Care Management Pharmacy Assistant   Name: Jacqueline [name] [facility]  MRN: [id number] DOB: 11/16/1950   12/20/2020- Patient called to remind of appointment with [name] , CPP on 12/21/2020 at 0830 for a telephone visit call.  Patient aware of appointment date, time, and type of appointment via  telephone.  Patient aware to have/bring all medications, supplements, blood pressure and/or blood sugar logs to visit.  Questions: Have you had any recent office visit or specialist visit outside of [place]? Yes she had her Neurology appointment  at [facility] in May.  Are there any concerns you would like to discuss during your office visit? Patient reports no concerns that she can think of at this moment.   Are you having any problems obtaining your medications? (Whether it pharmacy issues or cost) Patient stated she does need a new refill for Rosuvastatin as she has run out. She was out of town in March and had the provider send her a 4 day supply as she forgot her meds at home for the Rosuvastatin.  If patient has any PAP medications ask if they are having any problems getting their PAP medication or refill? No  Star Rating Drug: 09/14/2020 Rosuvastatin 10 mg for a 4 day supply at [facility] 11/21/2020 Lisinopril 10 mg last filled for a 90-Day supply from [facility]   Any gaps in medications fill history? Rosuvastatin 10 mg tablet daily  Last Annual Wellness visit 08/03/2020 next scheduled 08/07/2021.  [name], [place] Pharmacist Assistant [phone]

## 2020-12-21 ENCOUNTER — Ambulatory Visit (INDEPENDENT_AMBULATORY_CARE_PROVIDER_SITE_OTHER): Payer: Medicare HMO

## 2020-12-21 DIAGNOSIS — I10 Essential (primary) hypertension: Secondary | ICD-10-CM | POA: Diagnosis not present

## 2020-12-21 DIAGNOSIS — I700 Atherosclerosis of aorta: Secondary | ICD-10-CM

## 2020-12-21 DIAGNOSIS — E785 Hyperlipidemia, unspecified: Secondary | ICD-10-CM

## 2020-12-21 MED ORDER — ROSUVASTATIN CALCIUM 10 MG PO TABS: 10.0000 mg | ORAL_TABLET | Freq: Every day | ORAL | 0 refills | Status: DC

## 2020-12-21 NOTE — Progress Notes (Signed)
Chronic Care Management Pharmacy Note  12/22/2020 Name:  Jacqueline [name] [facility] MRN:  [id number] DOB:  26-Aug-1950  Summary: Patient is still struggling with significant swelling in her ankles. Her neurologist decreased gabapentin to twice daily, which provided no relief so she resumed TID dosing. She is trying to wear compression stockings and keep her feet elevated when possible. Otherwise she reports she is doing well overall.   Recommendations/Changes made from today's visit: Continue Current Medications  Patient was interested in [place]  Patient out of rosuvastatin, refill sent to pharmacy  Plan: CPP follow-up in one week to establish med sync plan and enroll in pharmacy  Subjective: Jacqueline [name] is an 70 y.o. year old female who is a primary patient of Delsa Grana, [place].  The CCM team was consulted for assistance with disease management and care coordination needs.    Engaged with patient by telephone for follow up visit in response to provider referral for pharmacy case management and/or care coordination services.   Consent to Services:  The patient was given information about Chronic Care Management services, agreed to services, and gave verbal consent prior to initiation of services.  Please see initial visit note for detailed documentation.   Patient Care Team: Delsa Grana, PA-C as PCP - General (Family Medicine) Bary Castilla [place] Gleason, MD (General Surgery) Germaine Pomfret, [facility] as Pharmacist (Pharmacist)  Recent office visits: 08/03/20: Patient presented to Clemetine Marker, LPN for AWV.  [birth year]: Patient presented to Delsa Grana, PA-C for follow-up. Albuterol, benzonatate, Levocetirizine, prednisone started. A1c worsened to 6.2%.   Recent consult visits: 11/22/20: Patient presented to Luella Cook, PA (Neurology) for Leg Cramping. Gabapentin de to 300 mg twice daily.  10/02/20: Patient presented to Dr. Rochele Pages (Dermatology)  08/14/20: Patient presented  to Dr. Rockey Situ (Cardiology) for follow-up.   Hospital visits: None in previous 6 months   Objective:  Lab Results  Component Value Date   CREATININE 0.84 07/03/2020   BUN 16 07/03/2020   GFR 45.58 (L) 04/30/2019   GFRNONAA 71 07/03/2020   GFRAA 83 07/03/2020   NA 142 07/03/2020   K 4.1 07/03/2020   CALCIUM 9.0 07/03/2020   CO2 28 07/03/2020   GLUCOSE 129 (H) 07/03/2020    Lab Results  Component Value Date/Time   HGBA1C 6.2 (H) 07/03/2020 10:59 AM   HGBA1C 5.7 (H) 09/23/2019 10:47 AM   GFR 45.58 (L) 04/30/2019 09:03 AM   GFR 63.97 03/24/2019 03:00 PM   MICROALBUR <0.2 12/28/2019 10:30 AM   MICROALBUR <0.2 06/25/2019 12:00 AM    Last diabetic Eye exam:  Lab Results  Component Value Date/Time   HMDIABEYEEXA No Retinopathy 12/18/2020 12:00 AM    Last diabetic Foot exam: No results found for: HMDIABFOOTEX   Lab Results  Component Value Date   CHOL 125 07/03/2020   HDL 50 07/03/2020   LDLCALC 55 07/03/2020   LDLDIRECT 149.5 12/25/2006   TRIG 122 07/03/2020   CHOLHDL 2.5 07/03/2020    Hepatic Function Latest Ref Rng & Units 07/03/2020 12/28/2019 09/23/2019  Total Protein 6.1 - 8.1 g/dL 6.8 6.7 6.6  Albumin 3.8 - 4.8 g/dL - - -  AST 10 - 35 U/L 19 18 20   ALT 6 - 29 U/L 19 19 26   Alk Phosphatase 39 - 117 IU/L - - -  Total Bilirubin 0.2 - 1.2 mg/dL 0.5 0.5 0.6  Bilirubin, Direct 0.0 - 0.3 mg/dL - - -    Lab Results  Component Value Date/Time   TSH  1.63 12/28/2019 10:30 AM   TSH 1.08 06/25/2019 12:00 AM    CBC Latest Ref Rng & Units 07/03/2020 12/28/2019 06/25/2019  WBC 3.8 - 10.8 Thousand/uL 8.5 8.4 9.0  Hemoglobin 11.7 - 15.5 g/dL 14.3 13.7 13.7  Hematocrit 35.0 - 45.0 % 43.1 40.5 40.5  Platelets 140 - 400 Thousand/uL 243 217 295    Lab Results  Component Value Date/Time   VD25OH 26.17 (L) 09/25/2017 09:16 AM    Clinical ASCVD: No  The ASCVD Risk score Mikey Bussing DC Jr., et al., 2013) failed to calculate for the following reasons:   The valid total  cholesterol range is 130 to 320 mg/dL    Depression screen [facility] 2/9 08/03/2020 07/03/2020 06/26/2020  Decreased Interest 0 0 0  Down, Depressed, Hopeless 0 0 0  PHQ - 2 Score 0 0 0  Altered sleeping - - -  Tired, decreased energy - - -  Change in appetite - - -  Feeling bad or failure about yourself  - - -  Trouble concentrating - - -  Moving slowly or fidgety/restless - - -  Suicidal thoughts - - -  PHQ-9 Score - - -  Difficult doing work/chores - - -    Social History   Tobacco Use  Smoking Status Former   Packs/day: 0.25   Years: 30.00   Pack years: 7.50   Types: Cigarettes   Start date: 07/09/1983   Quit date: 05/27/2014   Years since quitting: 6.5  Smokeless Tobacco Never   BP Readings from Last 3 Encounters:  08/14/20 122/72  08/03/20 122/78  07/03/20 124/82   Pulse Readings from Last 3 Encounters:  08/14/20 73  08/03/20 80  07/03/20 97   Wt Readings from Last 3 Encounters:  08/14/20 220 lb (99.8 kg)  08/03/20 219 lb 8 oz (99.6 kg)  07/03/20 213 lb 12.8 oz (97 kg)   BMI Readings from Last 3 Encounters:  08/14/20 37.76 kg/m  08/03/20 37.68 kg/m  07/03/20 36.70 kg/m    Assessment/Interventions: Review of patient past medical history, allergies, medications, health status, including review of consultants reports, laboratory and other test data, was performed as part of comprehensive evaluation and provision of chronic care management services.   SDOH:  (Social Determinants of Health) assessments and interventions performed: Yes SDOH Interventions    Flowsheet Row Most Recent Value  SDOH Interventions   Financial Strain Interventions Intervention Not Indicated      SDOH Screenings   Alcohol Screen: Low Risk    Last Alcohol Screening Score (AUDIT): 1  Depression (PHQ2-9): Low Risk    PHQ-2 Score: 0  Financial Resource Strain: Low Risk    Difficulty of Paying Living Expenses: Not very hard  Food Insecurity: No Food Insecurity   Worried About  Charity fundraiser in the Last Year: Never true   Ran Out of Food in the Last Year: Never true  Housing: Low Risk    Last Housing Risk Score: 0  Physical Activity: Inactive   Days of Exercise per Week: 0 days   Minutes of Exercise per Session: 0 min  Social Connections: Engineer, building services of Communication with Friends and Family: More than three times a week   Frequency of Social Gatherings with Friends and Family: More than three times a week   Attends Religious Services: More than 4 times per year   Active Member of [employer] or Organizations: Yes   Attends Archivist Meetings: More than 4 times per year  Marital Status: Married  Stress: No Stress Concern Present   Feeling of Stress : Not at all  Tobacco Use: Medium Risk   Smoking Tobacco Use: Former   Smokeless Tobacco Use: Never  Transportation Needs: No Data processing manager (Medical): No   Lack of Transportation (Non-Medical): No    CCM Care Plan  No Known Allergies  Medications Reviewed Today     Reviewed by Ronaldo Miyamoto, CMA (Certified Medical Assistant) on 08/14/20 at 4  Med List Status: <None>   Medication Order Taking? Sig Documenting Provider Last Dose Status Informant  aspirin 81 MG chewable tablet [id number] Yes Chew 81 mg by mouth daily. [provider] Taking Active   blood glucose meter kit and supplies KIT [id number] Yes Lifescan meter Check blood sugar 2 times daily as needed Jearld Fenton, NP Taking Active   calcium carbonate (OS-CAL) 600 MG TABS tablet [id number] Yes Take 1 tablet (600 mg total) by mouth 2 (two) times daily with a meal. Jearld Fenton, NP Taking Active   Cholecalciferol (VITAMIN D) 50 MCG (2000 UT) CAPS [id number] Yes Take by mouth. Jearld Fenton, NP Taking Active   ezetimibe (ZETIA) 10 MG tablet [id number] Yes Take 1 tablet (10 mg total) by mouth daily. Delsa Grana, PA-C Taking Active   gabapentin (NEURONTIN) 300 MG capsule  [id number] Yes Take 300 mg by mouth 3 (three) times daily. [provider] Taking Active            Med Note Coralee Pesa Aug 14, 2020  2:58 PM)    Lancets Glory Rosebush Elizabeth PLUS [id number]) [place] [id number] Yes USE AS DIRECTED TWICE A DAY AS NEEDED Jearld Fenton, NP Taking Active   levocetirizine (XYZAL) 5 MG tablet [id number] Yes Take 1 tablet (5 mg total) by mouth every evening. Delsa Grana, PA-C Taking Active   lisinopril (ZESTRIL) 10 MG tablet [id number] Yes Take 1 tablet (10 mg total) by mouth daily. Delsa Grana, PA-C Taking Active   Magnesium Oxide 500 MG CAPS [id number] Yes Take 1 capsule by mouth daily. [provider] Taking Active Self  metoprolol tartrate (LOPRESSOR) 25 MG tablet [id number] Yes Take 1 tablet (25 mg total) by mouth 2 (two) times daily. Minna Merritts, MD Taking Active   [facility] ULTRA test strip [id number] Yes USE AS DIRECTED TWICE A DAY AS NEEDED Delsa Grana, PA-C Taking Active   Potassium Gluconate 595 MG CAPS [id number] Yes Take 595 mg by mouth daily. [provider] Taking Active   rosuvastatin (CRESTOR) 10 MG tablet [id number] Yes Take 1 tablet (10 mg total) by mouth daily. Delsa Grana, PA-C Taking Active   traZODone (DESYREL) 50 MG tablet [id number] Yes Take 1 tablet (50 mg total) by mouth at bedtime. Delsa Grana, PA-C Taking Active   VITAMIN E PO [id number] Yes Take 800 Units by mouth daily. [provider] Taking Active   zinc gluconate 50 MG tablet [id number] Yes Take 50 mg by mouth daily. [provider] Taking Active             Patient Active Problem List   Diagnosis Date Noted   Peripheral edema 12/28/2019   Neuropathy 11/23/2019   Low serum vitamin B12 09/23/2019   Stage 3a chronic kidney disease ([place]) 09/23/2019   Controlled type 2 diabetes mellitus without complication, without long-term current use of insulin ([place]) 04/01/2019   History of kidney stones 10/14/2018   Insomnia 10/14/2018   Spinal  stenosis of  lumbar region 08/11/2009   Essential hypertension 01/14/2008   Class 2 severe obesity with serious comorbidity and body mass index (BMI) of 35.0 to 35.9 in adult ([place]) 12/25/2006   HLD (hyperlipidemia) 11/11/2006   Osteopenia 11/11/2006    Immunization History  Administered Date(s) Administered   Fluad Quad(high Dose 65+) 07/03/2020   Influenza,inj,Quad PF,6+ Mos 07/30/2018   Influenza-Unspecified 07/30/2018   PFIZER(Purple Top)SARS-COV-2 Vaccination 03/23/2020, 04/13/2020   Pneumococcal Polysaccharide-23 07/03/2020   Td 12/27/1997, 11/27/2009   Zoster, Live 10/27/2013    Conditions to be addressed/monitored:  Hypertension, Hyperlipidemia, Diabetes, Chronic Kidney Disease, and Osteopenia  Care Plan : General Pharmacy (Adult)  Updates made by Germaine Pomfret, RPH since 12/22/2020 12:00 AM     Problem: Hypertension, Hyperlipidemia, Diabetes, Chronic Kidney Disease, and Osteopenia   Priority: High     Long-Range Goal: Patient-Specific Goal   Start Date: 12/22/2020  Expected End Date: 06/23/2021  This Visit's Progress: On track  Priority: High  Note:   Current Barriers:  No barriers noted  Pharmacist Clinical Goal(s):  Patient will maintain control of blood pressure as evidenced by BP less than 140/90  through collaboration with PharmD and provider.   Interventions: 1:1 collaboration with Delsa Grana, PA-C regarding development and update of comprehensive plan of care as evidenced by provider attestation and co-signature Inter-disciplinary care team collaboration (see longitudinal plan of care) Comprehensive medication review performed; medication list updated in electronic medical record  Hypertension (BP goal <140/90) -Controlled -Current treatment: Lisinopril 10 mg daily  Metoprolol Tartrate 25 mg twice daily  -Medications previously tried: NA  -Current home readings: Has not been monitoring regularly  -Denies hypotensive/hypertensive  symptoms -Educated on Importance of home blood pressure monitoring; -Counseled to monitor BP at home weekly, document, and provide log at future appointments -Recommended to continue current medication  Hyperlipidemia: (LDL goal < 100) -Controlled -Current treatment: Ezetimibe 10 mg daily  Rosuvastatin 10 mg daily  -Medications previously tried: NA  -[facility]imbe [facility] using [facility].  -Educated on Cholesterol goals;  -Recommended to continue current medication  Diabetes (A1c goal <6.5%) -Controlled -Current medications: None -Medications previously tried: NA  -Current home glucose readings fasting glucose: NA -Educated on Carbohydrate counting and/or plate method -Recommended to continue current medication  Osteopenia (Goal Prevent Bone fractures) -Controlled -Last DEXA Scan: 12/22/2018   T-Score femoral neck: -1.8  T-Score total hip: NA  T-Score lumbar spine: -0.9  T-Score forearm radius: NA  10-year probability of major osteoporotic fracture: 17.3%  10-year probability of hip fracture: 2.2% -Patient is not a candidate for pharmacologic treatment -Current treatment  Calcium Carbonate 600 mg twice daily  Vitamin D 2000 units daily  -Medications previously tried: NA  -Repeat DEXA scheduled, not scheduled  -Gets  -Recommend [id number] units of vitamin D daily. Recommend 1200 mg of calcium daily from dietary and supplemental sources. -Recommended to continue current medication  Chronic Kidney Disease Stage 2  -All medications assessed for renal dosing and appropriateness in chronic kidney disease. -Recommended to continue current medication   Patient Goals/Self-Care Activities Patient will:  - focus on medication adherence by utilizing [place] check blood pressure weekly, document, and provide at future appointments  Follow Up Plan: Telephone follow up appointment with care management team member scheduled for:  12/28/2020 at 8:00 AM       Medication Assistance: None required.  Patient affirms current coverage meets needs.  Compliance/Adherence/Medication fill history: Care Gaps: Shingrix, Tdap, Covid Vaccine  Star-Rating Drugs: 09/14/2020 Rosuvastatin 10 mg for  a 4 day supply at [place] 11/21/2020 Lisinopril 10 mg last filled for a [facility]-Day supply [facility]  [id number] [place] [id number]: [phone]: [phone] [place] [id number]: [phone]: [phone] Uses pill box? Yes Pt endorses 100% compliance  We discussed: Verbal consent obtained for [facility] enhanced pharmacy services (medication synchronization, adherence packaging, delivery coordination). A medication sync plan was created to allow patient to get all medications delivered once every 30 to 90 days per patient preference. Patient understands they have freedom to choose pharmacy and clinical pharmacist will coordinate care between all prescribers and [facility]. Patient decided to: Utilize [facility] for medication synchronization, packaging and delivery  Care Plan and Follow Up Patient Decision:  Patient agrees to Care Plan and Follow-up.  Plan: Telephone follow up appointment with care management team member scheduled for:  12/28/2020 at 8:00 AM  ADoristine Section [phone]

## 2020-12-22 NOTE — Patient Instructions (Signed)
Visit Information It was great speaking with you today!  Please let me know if you have any questions about our visit.   Goals Addressed             This Visit's Progress    Track and Manage My Blood Pressure-Hypertension       Timeframe:  Long-Range Goal Priority:  High Start Date: 12/21/2020                            Expected End Date: 06/22/2021                      Follow Up Date 03/06/2021   - check blood pressure weekly    Why is this important?   You won't feel high blood pressure, but it can still hurt your blood vessels.  High blood pressure can cause heart or kidney problems. It can also cause a stroke.  Making lifestyle changes like losing a little weight or eating less salt will help.  Checking your blood pressure at home and at different times of the day can help to control blood pressure.  If the doctor prescribes medicine remember to take it the way the doctor ordered.  Call the office if you cannot afford the medicine or if there are questions about it.     Notes:          Patient Care Plan: General Pharmacy (Adult)     Problem Identified: Hypertension, Hyperlipidemia, Diabetes, Chronic Kidney Disease, and Osteopenia   Priority: High     Long-Range Goal: Patient-Specific Goal   Start Date: 12/22/2020  Expected End Date: 06/23/2021  This Visit's Progress: On track  Priority: High  Note:   Current Barriers:  No barriers noted  Pharmacist Clinical Goal(s):  Patient will maintain control of blood pressure as evidenced by BP less than 140/90  through collaboration with PharmD and provider.   Interventions: 1:1 collaboration with Delsa Grana, PA-C regarding development and update of comprehensive plan of care as evidenced by provider attestation and co-signature Inter-disciplinary care team collaboration (see longitudinal plan of care) Comprehensive medication review performed; medication list updated in electronic medical record  Hypertension (BP  goal <140/90) -Controlled -Current treatment: Lisinopril 10 mg daily  Metoprolol Tartrate 25 mg twice daily  -Medications previously tried: NA  -Current home readings: Has not been monitoring regularly  -Denies hypotensive/hypertensive symptoms -Educated on Importance of home blood pressure monitoring; -Counseled to monitor BP at home weekly, document, and provide log at future appointments -Recommended to continue current medication  Hyperlipidemia: (LDL goal < 100) -Controlled -Current treatment: Ezetimibe 10 mg daily  Rosuvastatin 10 mg daily  -Medications previously tried: NA  -[facility] using [facility].  -Educated on Cholesterol goals;  -Recommended to continue current medication  Diabetes (A1c goal <6.5%) -Controlled -Current medications: None -Medications previously tried: NA  -Current home glucose readings fasting glucose: NA -Educated on Carbohydrate counting and/or plate method -Recommended to continue current medication  Osteopenia (Goal Prevent Bone fractures) -Controlled -Last DEXA Scan: 12/22/2018   T-Score femoral neck: -1.8  T-Score total hip: NA  T-Score lumbar spine: -0.9  T-Score forearm radius: NA  10-year probability of major osteoporotic fracture: 17.3%  10-year probability of hip fracture: 2.2% -Patient is not a candidate for pharmacologic treatment -Current treatment  Calcium Carbonate 600 mg twice daily  Vitamin D 2000 units daily  -Medications previously tried: NA  -Repeat DEXA scheduled, not scheduled  -  Gets  -Recommend [phone] units of vitamin D daily. Recommend 1200 mg of calcium daily from dietary and supplemental sources. -Recommended to continue current medication  Chronic Kidney Disease Stage 2  -All medications assessed for renal dosing and appropriateness in chronic kidney disease. -Recommended to continue current medication   Patient Goals/Self-Care Activities Patient will:  - focus on medication adherence by  utilizing [place] check blood pressure weekly, document, and provide at future appointments  Follow Up Plan: Telephone follow up appointment with care management team member scheduled for:  12/28/2020 at 8:00 AM      Patient agreed to services and verbal consent obtained.   The patient verbalized understanding of instructions, educational materials, and care plan provided today and declined offer to receive copy of patient instructions, educational materials, and care plan.   Doristine Section, [place] [facility] [phone]

## 2020-12-25 ENCOUNTER — Telehealth: Payer: Self-pay

## 2020-12-25 NOTE — Telephone Encounter (Signed)
Pt called to report that her rosuvastatin is 180 dollars at her pharmacy so she is seeking an alternative option   Best contact: [phone]

## 2020-12-25 NOTE — Telephone Encounter (Signed)
She can look up med at [facility] and let me know alternate pharmacy to get meds for better cash price - please send refill to pharmacy of pt preference

## 2020-12-25 NOTE — Chronic Care Management (AMB) (Signed)
    Chronic Care Management Pharmacy Assistant   Name: Jacqueline [name]  MRN: [id number] DOB: Feb 29, 1951  Reason for Encounter: Medication Coordination of [facility] Pharmacy Services.   12/25/2020- Patient has a follow up visit with [name], Clinical Pharmacist on 12/28/2020 to review medications and onboard to [facility]. Medication cost analysis performed, [facility] is preferred in-network with patient's insurance and saves patient over $500 a month in medication cost. Onboarding form and cost analysis sent to [name], CPP to review.   Medications: Outpatient Encounter Medications as of 12/25/2020  Medication Sig   aspirin 81 MG chewable tablet Chew 81 mg by mouth daily.   blood glucose meter kit and supplies KIT [employer] meter Check blood sugar 2 times daily as needed   calcium carbonate (OS-CAL) 600 MG TABS tablet Take 600 mg by mouth daily with breakfast.   Cholecalciferol (VITAMIN D) 50 MCG (2000 UT) CAPS Take by mouth.   ezetimibe (ZETIA) 10 MG tablet Take 1 tablet (10 mg total) by mouth daily.   gabapentin (NEURONTIN) 300 MG capsule Take 300 mg by mouth 3 (three) times daily.   Lancets (ONETOUCH DELICA PLUS [id number]) MISC USE AS DIRECTED TWICE A DAY AS NEEDED   levocetirizine (XYZAL) 5 MG tablet Take 1 tablet (5 mg total) by mouth every evening.   lisinopril (ZESTRIL) 10 MG tablet Take 1 tablet (10 mg total) by mouth daily.   Magnesium Oxide 500 MG CAPS Take 1 capsule by mouth daily.   metoprolol tartrate (LOPRESSOR) 25 MG tablet Take 1 tablet (25 mg total) by mouth 2 (two) times daily.   ONETOUCH ULTRA test strip USE AS DIRECTED TWICE A DAY AS NEEDED   Potassium Gluconate 595 MG CAPS Take 595 mg by mouth daily.   rosuvastatin (CRESTOR) 10 MG tablet Take 1 tablet (10 mg total) by mouth daily.   traZODone (DESYREL) 50 MG tablet Take 1 tablet (50 mg total) by mouth at bedtime.   VITAMIN E PO Take 800 Units by mouth daily.   zinc gluconate  50 MG tablet Take 50 mg by mouth daily.   No facility-administered encounter medications on file as of 12/25/2020.    SIG: [name], [place] Pharmacist Assistant [phone]

## 2020-12-25 NOTE — Telephone Encounter (Signed)
Good rx 10 mg generic crestor sent to [facility] is around $20 for 3 month supply, at [facility] is around $16 3 month supply if she uses goodrx coupon codes and not her insurance and then pays cash - please send to pt's preferred pharmacy and help explain to her how to look [email or web address] up on her phone and find med/price/coupons thanks

## 2020-12-26 NOTE — Telephone Encounter (Signed)
[facility] made a mistake and did not file her insurance and she now have the medication

## 2020-12-27 ENCOUNTER — Telehealth: Payer: Self-pay

## 2020-12-27 NOTE — Progress Notes (Signed)
Spoke to patient to confirmed patient telephone appointment on 12/28/2020 for CCM at 8:00 am with [name] the Clinical pharmacist.   Patient Verbalized understanding.Patient aware to have all medications, supplements, blood pressure and blood sugar logs to visit.  Questions: Have you had any recent office visit or specialist visit outside of [place]?NO Are there any concerns you would like to discuss during your office visit?NO Are you having any problems obtaining your medications? NO   Star Rating Drug: 12/26/2020 Rosuvastatin 10 mg for a 90 day supply at [facility] 11/21/2020 Lisinopril 10 mg last filled for a 90-Day supply from [facility]  Any gaps in medications fill history? None ID  [place] Clinical Pharmacist Assistant [phone]

## 2020-12-28 ENCOUNTER — Ambulatory Visit: Payer: Self-pay

## 2020-12-28 DIAGNOSIS — E785 Hyperlipidemia, unspecified: Secondary | ICD-10-CM | POA: Diagnosis not present

## 2020-12-28 DIAGNOSIS — I10 Essential (primary) hypertension: Secondary | ICD-10-CM | POA: Diagnosis not present

## 2020-12-28 NOTE — Patient Instructions (Signed)
Visit Information It was great speaking with you today!  Please let me know if you have any questions about our visit.   Goals Addressed   None     Patient Care Plan: General Pharmacy (Adult)     Problem Identified: Hypertension, Hyperlipidemia, Diabetes, Chronic Kidney Disease, and Osteopenia   Priority: High     Long-Range Goal: Patient-Specific Goal   Start Date: 12/22/2020  Expected End Date: 06/23/2021  This Visit's Progress: On track  Recent Progress: On track  Priority: High  Note:   Current Barriers:  No barriers noted  Pharmacist Clinical Goal(s):  Patient will maintain control of blood pressure as evidenced by BP less than 140/90  through collaboration with PharmD and provider.   Interventions: 1:1 collaboration with Delsa Grana, PA-C regarding development and update of comprehensive plan of care as evidenced by provider attestation and co-signature Inter-disciplinary care team collaboration (see longitudinal plan of care) Comprehensive medication review performed; medication list updated in electronic medical record  Hypertension (BP goal <140/90) -Controlled -Current treatment: Lisinopril 10 mg daily  Metoprolol Tartrate 25 mg twice daily  -Medications previously tried: NA  -Current home readings: Has not been monitoring regularly  -Denies hypotensive/hypertensive symptoms -Educated on Importance of home blood pressure monitoring; -Counseled to monitor BP at home weekly, document, and provide log at future appointments -Recommended to continue current medication  Hyperlipidemia: (LDL goal < 100) -Controlled -Current treatment: Ezetimibe 10 mg daily  Rosuvastatin 10 mg daily  -Medications previously tried: NA  -[facility].  -Educated on Cholesterol goals;  -Recommended to continue current medication  Diabetes (A1c goal <6.5%) -Controlled -Current medications: None -Medications previously tried: NA  -Current home glucose  readings fasting glucose: NA -Educated on Carbohydrate counting and/or plate method -Recommended to continue current medication  Osteopenia (Goal Prevent Bone fractures) -Controlled -Last DEXA Scan: 12/22/2018   T-Score femoral neck: -1.8  T-Score total hip: NA  T-Score lumbar spine: -0.9  T-Score forearm radius: NA  10-year probability of major osteoporotic fracture: 17.3%  10-year probability of hip fracture: 2.2% -Patient is not a candidate for pharmacologic treatment -Current treatment  Calcium Carbonate 600 mg twice daily  Vitamin D 2000 units daily  -Medications previously tried: NA  -Repeat DEXA scheduled, not scheduled  -Gets  -Recommend [phone] units of vitamin D daily. Recommend 1200 mg of calcium daily from dietary and supplemental sources. -Recommended to continue current medication  Chronic Kidney Disease Stage 2  -All medications assessed for renal dosing and appropriateness in chronic kidney disease. -Recommended to continue current medication  Patient Goals/Self-Care Activities Patient will:  - focus on medication adherence by utilizing [place] check blood pressure weekly, document, and provide at future appointments  Follow Up Plan: Telephone follow up appointment with care management team member scheduled for:  05/02/2021 at 8:15 AM      Patient agreed to services and verbal consent obtained.   The patient verbalized understanding of instructions, educational materials, and care plan provided today and declined offer to receive copy of patient instructions, educational materials, and care plan.   Doristine Section, [place] [facility] [phone]

## 2020-12-28 NOTE — Progress Notes (Signed)
Chronic Care Management Pharmacy Note  12/28/2020 Name:  Jacqueline [name] [facility] MRN:  [id number] DOB:  May 03, 1951  Summary: Jacqueline [name] with patient regarding transition to [facility]. Reviewed fill dates and administration timings   Recommendations/Changes made from today's visit: Begin [facility]  Plan: CPP 4 month follow-up   Subjective: Jacqueline [name] is an 70 y.o. year old female who is a primary patient of Delsa Grana, [place].  The CCM team was consulted for assistance with disease management and care coordination needs.    Engaged with patient by telephone for follow up visit in response to provider referral for pharmacy case management and/or care coordination services.   Consent to Services:  The patient was given information about Chronic Care Management services, agreed to services, and gave verbal consent prior to initiation of services.  Please see initial visit note for detailed documentation.   Patient Care Team: Delsa Grana, PA-C as PCP - General (Family Medicine) Bary Castilla [place] Gleason, MD (General Surgery) Germaine Pomfret, [facility] as Pharmacist (Pharmacist)  Recent office visits: 08/03/20: Patient presented to Clemetine Marker, LPN for AWV.  63/87/56: Patient presented to Delsa Grana, PA-C for follow-up. Albuterol, benzonatate, Levocetirizine, prednisone started. A1c worsened to 6.2%.   Recent consult visits: 11/22/20: Patient presented to Luella Cook, PA (Neurology) for Leg Cramping. Gabapentin de to 300 mg twice daily.  10/02/20: Patient presented to Dr. Rochele Pages (Dermatology)  08/14/20: Patient presented to Dr. Rockey Situ (Cardiology) for follow-up.   Hospital visits: None in previous 6 months   Objective:  Lab Results  Component Value Date   CREATININE 0.84 07/03/2020   BUN 16 07/03/2020   GFR 45.58 (L) 04/30/2019   GFRNONAA 71 07/03/2020   GFRAA 83 07/03/2020   NA 142 07/03/2020   K 4.1 07/03/2020   CALCIUM 9.0 07/03/2020   CO2 28  07/03/2020   GLUCOSE 129 (H) 07/03/2020    Lab Results  Component Value Date/Time   HGBA1C 6.2 (H) 07/03/2020 10:59 AM   HGBA1C 5.7 (H) 09/23/2019 10:47 AM   GFR 45.58 (L) 04/30/2019 09:03 AM   GFR 63.97 03/24/2019 03:00 PM   MICROALBUR <0.2 12/28/2019 10:30 AM   MICROALBUR <0.2 06/25/2019 12:00 AM    Last diabetic Eye exam:  Lab Results  Component Value Date/Time   HMDIABEYEEXA No Retinopathy 12/18/2020 12:00 AM    Last diabetic Foot exam: No results found for: HMDIABFOOTEX   Lab Results  Component Value Date   CHOL 125 07/03/2020   HDL 50 07/03/2020   LDLCALC 55 07/03/2020   LDLDIRECT 149.5 12/25/2006   TRIG 122 07/03/2020   CHOLHDL 2.5 07/03/2020    Hepatic Function Latest Ref Rng & Units 07/03/2020 12/28/2019 09/23/2019  Total Protein 6.1 - 8.1 g/dL 6.8 6.7 6.6  Albumin 3.8 - 4.8 g/dL - - -  AST 10 - 35 U/L 19 18 20   ALT 6 - 29 U/L 19 19 26   Alk Phosphatase 39 - 117 IU/L - - -  Total Bilirubin 0.2 - 1.2 mg/dL 0.5 0.5 0.6  Bilirubin, Direct 0.0 - 0.3 mg/dL - - -    Lab Results  Component Value Date/Time   TSH 1.63 12/28/2019 10:30 AM   TSH 1.08 06/25/2019 12:00 AM    CBC Latest Ref Rng & Units 07/03/2020 12/28/2019 06/25/2019  WBC 3.8 - 10.8 Thousand/uL 8.5 8.4 9.0  Hemoglobin 11.7 - 15.5 g/dL 14.3 13.7 13.7  Hematocrit 35.0 - 45.0 % 43.1 40.5 40.5  Platelets 140 - 400 Thousand/uL 243 217  295    Lab Results  Component Value Date/Time   VD25OH 26.17 (L) 09/25/2017 09:16 AM    Clinical ASCVD: No  The ASCVD Risk score Mikey Bussing DC Jr., et al., 2013) failed to calculate for the following reasons:   The valid total cholesterol range is 130 to 320 mg/dL    Depression screen [facility] 2/9 08/03/2020 07/03/2020 06/26/2020  Decreased Interest 0 0 0  Down, Depressed, Hopeless 0 0 0  PHQ - 2 Score 0 0 0  Altered sleeping - - -  Tired, decreased energy - - -  Change in appetite - - -  Feeling bad or failure about yourself  - - -  Trouble concentrating - - -  Moving  slowly or fidgety/restless - - -  Suicidal thoughts - - -  PHQ-9 Score - - -  Difficult doing work/chores - - -    Social History   Tobacco Use  Smoking Status Former   Packs/day: 0.25   Years: 30.00   Pack years: 7.50   Types: Cigarettes   Start date: 07/09/1983   Quit date: 05/27/2014   Years since quitting: 6.5  Smokeless Tobacco Never   BP Readings from Last 3 Encounters:  08/14/20 122/72  08/03/20 122/78  07/03/20 124/82   Pulse Readings from Last 3 Encounters:  08/14/20 73  08/03/20 80  07/03/20 97   Wt Readings from Last 3 Encounters:  08/14/20 220 lb (99.8 kg)  08/03/20 219 lb 8 oz (99.6 kg)  07/03/20 213 lb 12.8 oz (97 kg)   BMI Readings from Last 3 Encounters:  08/14/20 37.76 kg/m  08/03/20 37.68 kg/m  07/03/20 36.70 kg/m    Assessment/Interventions: Review of patient past medical history, allergies, medications, health status, including review of consultants reports, laboratory and other test data, was performed as part of comprehensive evaluation and provision of chronic care management services.   SDOH:  (Social Determinants of Health) assessments and interventions performed: Yes SDOH Interventions    Flowsheet Row Most Recent Value  SDOH Interventions   Financial Strain Interventions Intervention Not Indicated       SDOH Screenings   Alcohol Screen: Not on file  Depression (PHQ2-9): Low Risk    PHQ-2 Score: 0  Financial Resource Strain: Low Risk    Difficulty of Paying Living Expenses: Not hard at all  Food Insecurity: No Food Insecurity   Worried About Charity fundraiser in the Last Year: Never true   Ran Out of Food in the Last Year: Never true  Housing: Low Risk    Last Housing Risk Score: 0  Physical Activity: Inactive   Days of Exercise per Week: 0 days   Minutes of Exercise per Session: 0 min  Social Connections: Engineer, building services of Communication with Friends and Family: More than three times a week   Frequency  of Social Gatherings with Friends and Family: More than three times a week   Attends Religious Services: More than 4 times per year   Active Member of [employer] or Organizations: Yes   Attends Music therapist: More than 4 times per year   Marital Status: Married  Stress: No Stress Concern Present   Feeling of Stress : Not at all  Tobacco Use: Medium Risk   Smoking Tobacco Use: Former   Smokeless Tobacco Use: Never  Transportation Needs: No Data processing manager (Medical): No   Lack of Transportation (Non-Medical): No    CCM Care Plan  No Known Allergies  Medications Reviewed Today     Reviewed by Ronaldo Miyamoto, CMA (Certified Medical Assistant) on 08/14/20 at 81  Med List Status: <None>   Medication Order Taking? Sig Documenting Provider Last Dose Status Informant  aspirin 81 MG chewable tablet [id number] Yes Chew 81 mg by mouth daily. [provider] Taking Active   blood glucose meter kit and supplies KIT [id number] Yes Lifescan meter Check blood sugar 2 times daily as needed Jearld Fenton, NP Taking Active   calcium carbonate (OS-CAL) 600 MG TABS tablet [id number] Yes Take 1 tablet (600 mg total) by mouth 2 (two) times daily with a meal. Jearld Fenton, NP Taking Active   Cholecalciferol (VITAMIN D) 50 MCG (2000 UT) CAPS [id number] Yes Take by mouth. Jearld Fenton, NP Taking Active   ezetimibe (ZETIA) 10 MG tablet [id number] Yes Take 1 tablet (10 mg total) by mouth daily. Delsa Grana, PA-C Taking Active   gabapentin (NEURONTIN) 300 MG capsule [id number] Yes Take 300 mg by mouth 3 (three) times daily. [provider] Taking Active            Med Note Coralee Pesa Aug 14, 2020  2:58 PM)    Lancets Glory [place] PLUS [id number]) [place] [id number] Yes USE AS DIRECTED TWICE A DAY AS NEEDED Jearld Fenton, NP Taking Active   levocetirizine (XYZAL) 5 MG tablet [id number] Yes Take 1 tablet (5 mg total) by mouth every evening.  Delsa Grana, PA-C Taking Active   lisinopril (ZESTRIL) 10 MG tablet [id number] Yes Take 1 tablet (10 mg total) by mouth daily. Delsa Grana, PA-C Taking Active   Magnesium Oxide 500 MG CAPS [id number] Yes Take 1 capsule by mouth daily. [provider] Taking Active Self  metoprolol tartrate (LOPRESSOR) 25 MG tablet [id number] Yes Take 1 tablet (25 mg total) by mouth 2 (two) times daily. Minna Merritts, MD Taking Active   [facility] ULTRA test strip [id number] Yes USE AS DIRECTED TWICE A DAY AS NEEDED Delsa Grana, PA-C Taking Active   Potassium Gluconate 595 MG CAPS [id number] Yes Take 595 mg by mouth daily. [provider] Taking Active   rosuvastatin (CRESTOR) 10 MG tablet [id number] Yes Take 1 tablet (10 mg total) by mouth daily. Delsa Grana, PA-C Taking Active   traZODone (DESYREL) 50 MG tablet [id number] Yes Take 1 tablet (50 mg total) by mouth at bedtime. Delsa Grana, PA-C Taking Active   VITAMIN E PO [id number] Yes Take 800 Units by mouth daily. [provider] Taking Active   zinc gluconate 50 MG tablet [id number] Yes Take 50 mg by mouth daily. [provider] Taking Active             Patient Active Problem List   Diagnosis Date Noted   Peripheral edema 12/28/2019   Neuropathy 11/23/2019   Low serum vitamin B12 09/23/2019   Stage 3a chronic kidney disease ([place]) 09/23/2019   Controlled type 2 diabetes mellitus without complication, without long-term current use of insulin ([place]) 04/01/2019   History of kidney stones 10/14/2018   Insomnia 10/14/2018   Spinal stenosis of lumbar region 08/11/2009   Essential hypertension 01/14/2008   Class 2 severe obesity with serious comorbidity and body mass index (BMI) of 35.0 to 35.9 in adult ([place]) 12/25/2006   HLD (hyperlipidemia) 11/11/2006   Osteopenia 11/11/2006    Immunization History  Administered Date(s) Administered   Fluad Quad(high Dose 65+) 07/03/2020   Influenza,inj,Quad PF,6+ Mos 07/30/2018  Influenza-Unspecified 07/30/2018   PFIZER(Purple Top)SARS-COV-2 Vaccination 03/23/2020, 04/13/2020   Pneumococcal Polysaccharide-23 07/03/2020   Td 12/27/1997, 11/27/2009   Zoster, Live 10/27/2013    Conditions to be addressed/monitored:  Hypertension, Hyperlipidemia, Diabetes, Chronic Kidney Disease, and Osteopenia  Care Plan : General Pharmacy (Adult)  Updates made by Germaine Pomfret, RPH since 12/28/2020 12:00 AM     Problem: Hypertension, Hyperlipidemia, Diabetes, Chronic Kidney Disease, and Osteopenia   Priority: High     Long-Range Goal: Patient-Specific Goal   Start Date: 12/22/2020  Expected End Date: 06/23/2021  This Visit's Progress: On track  Recent Progress: On track  Priority: High  Note:   Current Barriers:  No barriers noted  Pharmacist Clinical Goal(s):  Patient will maintain control of blood pressure as evidenced by BP less than 140/90  through collaboration with PharmD and provider.   Interventions: 1:1 collaboration with Delsa Grana, PA-C regarding development and update of comprehensive plan of care as evidenced by provider attestation and co-signature Inter-disciplinary care team collaboration (see longitudinal plan of care) Comprehensive medication review performed; medication list updated in electronic medical record  Hypertension (BP goal <140/90) -Controlled -Current treatment: Lisinopril 10 mg daily  Metoprolol Tartrate 25 mg twice daily  -Medications previously tried: NA  -Current home readings: Has not been monitoring regularly  -Denies hypotensive/hypertensive symptoms -Educated on Importance of home blood pressure monitoring; -Counseled to monitor BP at home weekly, document, and provide log at future appointments -Recommended to continue current medication  Hyperlipidemia: (LDL goal < 100) -Controlled -Current treatment: Ezetimibe 10 mg daily  Rosuvastatin 10 mg daily  -Medications previously tried: NA  -Ezetimbe [facility]  using GoodRx.  -Educated on Cholesterol goals;  -Recommended to continue current medication  Diabetes (A1c goal <6.5%) -Controlled -Current medications: None -Medications previously tried: NA  -Current home glucose readings fasting glucose: NA -Educated on Carbohydrate counting and/or plate method -Recommended to continue current medication  Osteopenia (Goal Prevent Bone fractures) -Controlled -Last DEXA Scan: 12/22/2018   T-Score femoral neck: -1.8  T-Score total hip: NA  T-Score lumbar spine: -0.9  T-Score forearm radius: NA  10-year probability of major osteoporotic fracture: 17.3%  10-year probability of hip fracture: 2.2% -Patient is not a candidate for pharmacologic treatment -Current treatment  Calcium Carbonate 600 mg twice daily  Vitamin D 2000 units daily  -Medications previously tried: NA  -Repeat DEXA scheduled, not scheduled  -Gets  -Recommend [phone] units of vitamin D daily. Recommend 1200 mg of calcium daily from dietary and supplemental sources. -Recommended to continue current medication  Chronic Kidney Disease Stage 2  -All medications assessed for renal dosing and appropriateness in chronic kidney disease. -Recommended to continue current medication  Patient Goals/Self-Care Activities Patient will:  - focus on medication adherence by utilizing [place] check blood pressure weekly, document, and provide at future appointments  Follow Up Plan: Telephone follow up appointment with care management team member scheduled for:  05/02/2021 at 8:15 AM       Medication Assistance: None required.  Patient affirms current coverage meets needs.  Compliance/Adherence/Medication fill history: Care Gaps: Shingrix, Tdap, Covid Vaccine  Star-Rating Drugs: 09/14/2020 Rosuvastatin 10 mg for a 4 day supply at [facility] 11/21/2020 Lisinopril 10 mg last filled for a 90-Day supply from [facility]  Patient's preferred pharmacy  is:  [facility] - [place], [place] - [address] [place] [address] Phone: [phone] Fax: [phone]  Uses pill box? Yes Pt endorses  100% compliance  We discussed: Verbal consent obtained for [facility] enhanced pharmacy services (medication synchronization, adherence packaging, delivery coordination). A medication sync plan was created to allow patient to get all medications delivered once every 30 to 90 days per patient preference. Patient understands they have freedom to choose pharmacy and clinical pharmacist will coordinate care between all prescribers and [facility]. Patient decided to: Utilize [facility] for medication synchronization, packaging and delivery  Care Plan and Follow Up Patient Decision:  Patient agrees to Care Plan and Follow-up.  Plan: Telephone follow up appointment with care management team member scheduled for:  05/02/2021 at 8:15 AM  Doristine Section, [facility] [phone]

## 2021-01-05 ENCOUNTER — Telehealth: Payer: Self-pay

## 2021-01-05 ENCOUNTER — Other Ambulatory Visit: Payer: Self-pay

## 2021-01-05 DIAGNOSIS — E785 Hyperlipidemia, unspecified: Secondary | ICD-10-CM

## 2021-01-05 DIAGNOSIS — I700 Atherosclerosis of aorta: Secondary | ICD-10-CM

## 2021-01-05 MED ORDER — EZETIMIBE 10 MG PO TABS: 10.0000 mg | ORAL_TABLET | Freq: Every day | ORAL | 2 refills | Status: DC

## 2021-01-05 MED ORDER — METOPROLOL TARTRATE 25 MG PO TABS
25.0000 mg | ORAL_TABLET | Freq: Two times a day (BID) | ORAL | 2 refills | Status: DC
Start: 2021-01-05 — End: 2021-11-06

## 2021-01-05 NOTE — Chronic Care Management (AMB) (Signed)
    Chronic Care Management Pharmacy Assistant   Name: Jacqueline [name] [facility]  MRN: [id number] DOB: Sep 27, 1950  Reason for Encounter: Medication Review/ Care Coordination with Outside Provider/ Enhanced Pharmacy Coordination  01/05/2021- [facility] Cardiology, office of Dr Ida Rogue to request refills to be sent of Zetia and Metoprolol to [facility] for a 90 day supply. Spoke with Gabriel Cirri, she is sending the request to [place] of Dr Rockey Situ.  [facility] Neurology office of Gurney Maxin, MD and Luella Cook, PA to request refills of Gabapentin and to inform patient is taking TID, office is closed will retry on 01/09/2021.  Called [facility] to transfer prescriptions to [facility], spoke with pharmacy technician, Rosuvastatin, Lisinopril and Trazadone all have refills left on the prescription and they are sending to [facility] for pharmacy services.  01/15/2021-  Called patient to check on quantity on hand for Trazodone, spoke with patient, she states she had about 1 week left of medications and aware that [facility] will call to coordinate delivery with her. Patient understand. Called Neurology back, spoke with Ebony Hail, request sent to assistant to have Gabapentin 300 mg with patient taking three times daily to [facility].  [name], CPP notified.    Medications: Outpatient Encounter Medications as of 01/05/2021  Medication Sig   aspirin 81 MG chewable tablet Chew 81 mg by mouth daily.   blood glucose meter kit and supplies KIT Lifescan meter Check blood sugar 2 times daily as needed   calcium carbonate (OS-CAL) 600 MG TABS tablet Take 600 mg by mouth daily with breakfast.   Cholecalciferol (VITAMIN D) 50 MCG (2000 UT) CAPS Take by mouth.   ezetimibe (ZETIA) 10 MG tablet Take 1 tablet (10 mg total) by mouth daily.   gabapentin (NEURONTIN) 300 MG capsule Take 300 mg by mouth 3 (three) times daily.   Lancets (ONETOUCH [place] PLUS [id number]) MISC USE AS  DIRECTED TWICE A DAY AS NEEDED   lisinopril (ZESTRIL) 10 MG tablet Take 1 tablet (10 mg total) by mouth daily.   Magnesium Oxide 500 MG CAPS Take 1 capsule by mouth daily.   metoprolol tartrate (LOPRESSOR) 25 MG tablet Take 1 tablet (25 mg total) by mouth 2 (two) times daily.   ONETOUCH ULTRA test strip USE AS DIRECTED TWICE A DAY AS NEEDED   Potassium Gluconate 595 MG CAPS Take 595 mg by mouth daily.   rosuvastatin (CRESTOR) 10 MG tablet Take 1 tablet (10 mg total) by mouth daily.   traZODone (DESYREL) 50 MG tablet Take 1 tablet (50 mg total) by mouth at bedtime.   VITAMIN E PO Take 800 Units by mouth daily.   zinc gluconate 50 MG tablet Take 50 mg by mouth daily.   No facility-administered encounter medications on file as of 01/05/2021.    Care Gaps: Zoster Vaccines- Shingrix (1 of 2)    TETANUS/TDAP (Every 10 Years) COVID-19 Vaccine (3 - Pfizer risk series) HEMOGLOBIN A1C (Every 6 Months) Annual Wellness Visit   Star Rating Drugs: 12/26/2020 Rosuvastatin 10 mg for a 90 day supply at [facility] 11/21/2020 Lisinopril 10 mg last filled for a 90-Day supply from [place]   Follow up Visit 05/02/2021 at 8:15 am with [name], CPP.   SIG: [name], [place] Pharmacist Assistant [phone]

## 2021-01-05 NOTE — Telephone Encounter (Signed)
*  STAT* If patient is at the pharmacy, call can be transferred to refill team.   1. Which medications need to be refilled? (please list name of each medication and dose if known) Zetia, Metoprolol  2. Which pharmacy/location (including street and city if local pharmacy) is medication to be sent to? [facility] 3. Do they need a 30 day or 90 day supply? [place]

## 2021-01-29 ENCOUNTER — Ambulatory Visit
Admission: RE | Admit: 2021-01-29 | Discharge: 2021-01-29 | Disposition: A | Discharge status: Home / Self Care | Payer: Medicare HMO | Source: Ambulatory Visit | Attending: Family Medicine | Admitting: Family Medicine

## 2021-01-29 ENCOUNTER — Other Ambulatory Visit: Payer: Self-pay

## 2021-01-29 DIAGNOSIS — Z1231 Encounter for screening mammogram for malignant neoplasm of breast: Secondary | ICD-10-CM | POA: Insufficient documentation

## 2021-01-31 ENCOUNTER — Telehealth: Payer: Self-pay | Admitting: Cardiovascular Disease

## 2021-01-31 NOTE — Telephone Encounter (Signed)
Patient c/o Palpitations:  High priority if patient c/o lightheadedness, shortness of breath, or chest pain  How long have you had palpitations/irregular HR/ Afib? Are you having the symptoms now? Patient having palpitations 2 or 3 times a day   Are you currently experiencing lightheadedness, SOB or CP? Little SOB  Do you have a history of afib (atrial fibrillation) or irregular heart rhythm? No afib   Have you checked your BP or HR? (document readings if available): patient has no readings   Are you experiencing any other symptoms? No, would like to know if she should make changes to beta blocker medication

## 2021-01-31 NOTE — Telephone Encounter (Signed)
Spoke with patient and she stated that she has been feeling frequent palpitations in the last 2 weeks. Yesterday in the grocery store she had to sit down for a moment, and it lasted for about 5 minutes. Patient stated that the metoprolol that was started for Paroxysmal tachycardia in 08/2020 had been working well, and now she feels that it is not working. She also stated that she does feel some SOB with these episodes.  I scheduled her for our soonest available appointment with  an APP, Cadence Furth PA on 03/09/21. I advised that she take her pulse a couple times a day and during these episodes and to keep a log to show [place]. I also advised patient that if she were to develop any chest pain or increased SOB with these episodes that she go to the nearest ED.   Patient verbalized understanding and agreed with plan.   From OV with Dr. Rockey Situ 08/14/20: Event monitor, reviewed Sinus rhythm  5  Supraventricular Tachycardia runs  the longest lasting 6 beats with an avg rate of 125 bpm. Patient triggered events associated with symptomatic PVCs Previous trip to the emergency room for tachycardia prior to starting metoprolol

## 2021-02-01 ENCOUNTER — Other Ambulatory Visit: Payer: Self-pay

## 2021-02-01 ENCOUNTER — Ambulatory Visit
Admission: RE | Admit: 2021-02-01 | Discharge: 2021-02-01 | Disposition: A | Discharge status: Home / Self Care | Payer: Medicare HMO | Source: Ambulatory Visit | Attending: Family Medicine | Admitting: Family Medicine

## 2021-02-01 DIAGNOSIS — M8589 Other specified disorders of bone density and structure, multiple sites: Secondary | ICD-10-CM | POA: Diagnosis not present

## 2021-02-01 DIAGNOSIS — Z780 Asymptomatic menopausal state: Secondary | ICD-10-CM | POA: Diagnosis not present

## 2021-02-12 ENCOUNTER — Ambulatory Visit: Payer: Self-pay | Admitting: Family Medicine

## 2021-02-13 ENCOUNTER — Telehealth: Payer: Self-pay

## 2021-02-13 NOTE — Progress Notes (Signed)
Chronic Care Management Pharmacy Assistant   Name: Jacqueline [name] [facility]  MRN: [id number] DOB: 26-Mar-1951  Reason for Encounter: Hypertension Disease State Call Disease State   Recent office visits:  None ID  Recent consult visits:  None ID  Hospital visits:  None in previous 6 months  Medications: Outpatient Encounter Medications as of 02/13/2021  Medication Sig   aspirin 81 MG chewable tablet Chew 81 mg by mouth daily.   blood glucose meter kit and supplies KIT Lifescan meter Check blood sugar 2 times daily as needed   calcium carbonate (OS-CAL) 600 MG TABS tablet Take 600 mg by mouth daily with breakfast.   Cholecalciferol (VITAMIN D) 50 MCG (2000 UT) CAPS Take by mouth.   ezetimibe (ZETIA) 10 MG tablet Take 1 tablet (10 mg total) by mouth daily.   gabapentin (NEURONTIN) 300 MG capsule Take 300 mg by mouth 3 (three) times daily.   Lancets (ONETOUCH DELICA PLUS [id number]) MISC USE AS DIRECTED TWICE A DAY AS NEEDED   lisinopril (ZESTRIL) 10 MG tablet Take 1 tablet (10 mg total) by mouth daily.   Magnesium Oxide 500 MG CAPS Take 1 capsule by mouth daily.   metoprolol tartrate (LOPRESSOR) 25 MG tablet Take 1 tablet (25 mg total) by mouth 2 (two) times daily.   ONETOUCH ULTRA test strip USE AS DIRECTED TWICE A DAY AS NEEDED   Potassium Gluconate 595 MG CAPS Take 595 mg by mouth daily.   rosuvastatin (CRESTOR) 10 MG tablet Take 1 tablet (10 mg total) by mouth daily.   traZODone (DESYREL) 50 MG tablet Take 1 tablet (50 mg total) by mouth at bedtime.   VITAMIN E PO Take 800 Units by mouth daily.   zinc gluconate 50 MG tablet Take 50 mg by mouth daily.   No facility-administered encounter medications on file as of 02/13/2021.   Care Gaps: Zoster Vaccines- Shingrix TETANUS/TDAP (last completed 11/27/2009) COVID-19 Vaccine Booster 3 HEMOGLOBIN A1C  (last completed 07/03/2020) INFLUENZA VACCINE (last completed 07/03/2020)  Star Rating Drugs: Rosuvastatin 10 mg last filled on  12/26/2020 for a 90-Day supply with [facility] Lisinopril 10 mg last filled on 11/21/2020 for a 90-Day supply with [facility]  Recent Office Vitals: BP Readings from Last 3 Encounters:  08/14/20 122/72  08/03/20 122/78  07/03/20 124/82   Pulse Readings from Last 3 Encounters:  08/14/20 73  08/03/20 80  07/03/20 97    Wt Readings from Last 3 Encounters:  08/14/20 220 lb (99.8 kg)  08/03/20 219 lb 8 oz (99.6 kg)  07/03/20 213 lb 12.8 oz (97 kg)     Kidney Function Lab Results  Component Value Date/Time   CREATININE 0.84 07/03/2020 10:59 AM   CREATININE 0.85 12/28/2019 10:30 AM   GFR 45.58 (L) 04/30/2019 09:03 AM   GFRNONAA 71 07/03/2020 10:59 AM   GFRAA 83 07/03/2020 10:59 AM    BMP Latest Ref Rng & Units 07/03/2020 12/28/2019 09/23/2019  Glucose 65 - 99 mg/dL 129(H) 81 104(H)  BUN 7 - 25 mg/dL _0 Creatinine 0.50 - 0.99 mg/dL 0.84 0.85 0.96  BUN/Creat Ratio 6 - 22 (calc) NOT APPLICABLE NOT APPLICABLE NOT APPLICABLE  Sodium 354 - 146 mmol/L 142 141 140  Potassium 3.5 - 5.3 mmol/L 4.1 4.6 4.8  Chloride 98 - 110 mmol/L 107 106 104  CO2 20 - 32 mmol/L _1 Calcium 8.6 - 10.4 mg/dL 9.0 9.1 9.6   Reviewed chart prior to disease state call. Spoke with patient  regarding BP  Current antihypertensive regimen:  Metoprolol Tartrate 25 mg twice daily Lisinopril 10 mg 1 tablet daily   How often are you checking your Blood Pressure? daily  Current home BP readings:  02/02/2021 - 7:30 am  163/86  Pulse 72 02/03/2021 - 2:30 pm  127/73 Pulse 70 02/05/2021 - 7:30 am 130/81 Pulse 81 02/06/2021 - 5:00 pm 123/72 Pulse 70 02/07/2021 - 10:00 am  143/78 - Pulse 75 02/08/2021 - 7:30 pm 143/72 - Pulse 69 02/09/2021 9:30 am  123/74  Pulse 84  What recent interventions/DTPs have been made by any provider to improve Blood Pressure control since last CPP Visit: None at this time she has been having some issues with palpitations but is working with Cardio to get them resolved.   Any  recent hospitalizations or ED visits since last visit with CPP? No  What diet changes have been made to improve Blood Pressure Control?  Patient stated she is not on a diet she loves food.  What exercise is being done to improve your Blood Pressure Control?  Patient reports she moves around on her own. No certain exercise regiment   Adherence Review: Is the patient currently on ACE/ARB medication? Yes Does the patient have >5 day gap between last estimated fill dates? No  Patient stated that she has been experiencing heart palpitations on and off, and it get's to the point that her heart is racing so fast it causes her to be short of breath. She is working with Cardiology to assist her in getting this under control. She stated she was advised yesterday to start taking her blood pressure a few hours after she has taken her medications and send those numbers in as she has been taking it but not on a regular schedule. So she is going to do that and report the numbers to Cardio and provider will at that time see if any medication adjustments need to be made.  Patient has appointment scheduled with [name], CPP on 05/02/2021 _0  AWV completed 08/03/2020  Lynann Bologna, CPA/CMA Clinical Pharmacist Assistant Phone: [phone]

## 2021-03-02 ENCOUNTER — Other Ambulatory Visit: Payer: Self-pay

## 2021-03-02 ENCOUNTER — Ambulatory Visit (INDEPENDENT_AMBULATORY_CARE_PROVIDER_SITE_OTHER): Payer: Medicare HMO | Admitting: Family Medicine

## 2021-03-02 ENCOUNTER — Encounter: Payer: Self-pay | Admitting: Family Medicine

## 2021-03-02 VITALS — BP 126/72 | HR 81 | Temp 97.6°F | Resp 16 | Ht 64.0 in | Wt 226.2 lb

## 2021-03-02 DIAGNOSIS — R609 Edema, unspecified: Secondary | ICD-10-CM

## 2021-03-02 DIAGNOSIS — G629 Polyneuropathy, unspecified: Secondary | ICD-10-CM

## 2021-03-02 DIAGNOSIS — E559 Vitamin D deficiency, unspecified: Secondary | ICD-10-CM

## 2021-03-02 DIAGNOSIS — I700 Atherosclerosis of aorta: Secondary | ICD-10-CM | POA: Diagnosis not present

## 2021-03-02 DIAGNOSIS — E538 Deficiency of other specified B group vitamins: Secondary | ICD-10-CM | POA: Diagnosis not present

## 2021-03-02 DIAGNOSIS — M8580 Other specified disorders of bone density and structure, unspecified site: Secondary | ICD-10-CM | POA: Diagnosis not present

## 2021-03-02 DIAGNOSIS — E785 Hyperlipidemia, unspecified: Secondary | ICD-10-CM | POA: Diagnosis not present

## 2021-03-02 DIAGNOSIS — E119 Type 2 diabetes mellitus without complications: Secondary | ICD-10-CM | POA: Diagnosis not present

## 2021-03-02 DIAGNOSIS — G4700 Insomnia, unspecified: Secondary | ICD-10-CM | POA: Diagnosis not present

## 2021-03-02 DIAGNOSIS — N1831 Chronic kidney disease, stage 3a: Secondary | ICD-10-CM

## 2021-03-02 DIAGNOSIS — I10 Essential (primary) hypertension: Secondary | ICD-10-CM | POA: Diagnosis not present

## 2021-03-02 DIAGNOSIS — M8589 Other specified disorders of bone density and structure, multiple sites: Secondary | ICD-10-CM | POA: Diagnosis not present

## 2021-03-02 MED ORDER — ROSUVASTATIN CALCIUM 10 MG PO TABS: 10.0000 mg | ORAL_TABLET | Freq: Every day | ORAL | 3 refills | Status: DC

## 2021-03-02 NOTE — Progress Notes (Signed)
Name: Jacqueline [name]   MRN: [id number]    DOB: 12-29-1950   Date:03/02/2021       Progress Note  Chief Complaint  Patient presents with   Hyperlipidemia    f   Follow-up    Bone Density results   Hypertension     Subjective:   Jacqueline [name] is a 70 y.o. female, presents to clinic for routine f/up and to discuss bone density results   Hypertension:  Currently managed on lisinopril 10 and metoprolol 25 Pt reports good med compliance and denies any SE.   Blood pressure today is well controlled. BP Readings from Last 3 Encounters:  03/02/21 126/72  08/14/20 122/72  08/03/20 122/78   Pt denies CP, SOB, exertional sx, LE edema, palpitation, Ha's, visual disturbances, lightheadedness, hypotension, syncope.   Hyperlipidemia: Currently treated with crestor zetia, pt reports good med compliance Last Lipids: Lab Results  Component Value Date   CHOL 125 07/03/2020   HDL 50 07/03/2020   LDLCALC 55 07/03/2020   LDLDIRECT 149.5 12/25/2006   TRIG 122 07/03/2020   CHOLHDL 2.5 07/03/2020   - Denies: Chest pain, shortness of breath, myalgias, claudication  Reviewed prior and recent bone density - pt on calcium supplement 600 mg daily and Vit D supplement Prior bone density: Osteopenia (Goal Prevent Bone fractures) -Controlled -Last DEXA Scan: 12/22/2018              T-Score femoral neck: -1.8             T-Score total hip: NA             T-Score lumbar spine: -0.9             T-Score forearm radius: NA             10-year probability of major osteoporotic fracture: 17.3%             10-year probability of hip fracture: 2.2% Recent dexa worse Last vitamin D Lab Results  Component Value Date   VD25OH 26.17 (L) 09/25/2017  Calcium normal with last labs    DM:   Pt managing DM diet/lifestyle - states not as good recently and has gained some weight Denies: Polyuria, polydipsia, vision changes, neuropathy, hypoglycemia Recent pertinent labs: Lab Results   Component Value Date   HGBA1C 6.2 (H) 07/03/2020   HGBA1C 5.7 (H) 09/23/2019   HGBA1C 5.8 (H) 06/25/2019   Lab Results  Component Value Date   MICROALBUR <0.2 12/28/2019   LDLCALC 55 07/03/2020   CREATININE 0.84 07/03/2020   Standard of care and health maintenance: Foot exam:  UTD DM eye exam:  UTD ACEI/ARB:  yes Statin:  yes  Neuropathy - well controlled on gabapentin  Vit B12 deficiency - last labs showed B12 high she stopped supplement   Current Outpatient Medications:    aspirin 81 MG chewable tablet, Chew 81 mg by mouth daily., Disp: , Rfl:    blood glucose meter kit and supplies KIT, Lifescan meter Check blood sugar 2 times daily as needed, Disp: 1 each, Rfl: 0   calcium carbonate (OS-CAL) 600 MG TABS tablet, Take 600 mg by mouth daily with breakfast., Disp: 30 tablet, Rfl: 0   Cholecalciferol (VITAMIN D) 50 MCG (2000 UT) CAPS, Take by mouth., Disp: 30 capsule, Rfl:    ezetimibe (ZETIA) 10 MG tablet, Take 1 tablet (10 mg total) by mouth daily., Disp: 90 tablet, Rfl: 2   gabapentin (NEURONTIN) 300 MG capsule, Take 300 mg by  mouth 3 (three) times daily., Disp: , Rfl:    Lancets (ONETOUCH DELICA PLUS [id number]) MISC, USE AS DIRECTED TWICE A DAY AS NEEDED, Disp: 200 each, Rfl: 0   lisinopril (ZESTRIL) 10 MG tablet, Take 1 tablet (10 mg total) by mouth daily., Disp: 90 tablet, Rfl: 3   Magnesium Oxide 500 MG CAPS, Take 1 capsule by mouth daily., Disp: , Rfl:    metoprolol tartrate (LOPRESSOR) 25 MG tablet, Take 1 tablet (25 mg total) by mouth 2 (two) times daily., Disp: 180 tablet, Rfl: 2   ONETOUCH ULTRA test strip, USE AS DIRECTED TWICE A DAY AS NEEDED, Disp: 200 strip, Rfl: 1   Potassium Gluconate 595 MG CAPS, Take 595 mg by mouth daily., Disp: , Rfl:    rosuvastatin (CRESTOR) 10 MG tablet, Take 1 tablet (10 mg total) by mouth daily., Disp: 30 tablet, Rfl: 0   traZODone (DESYREL) 50 MG tablet, Take 1 tablet (50 mg total) by mouth at bedtime., Disp: 90 tablet, Rfl: 3    VITAMIN E PO, Take 800 Units by mouth daily., Disp: , Rfl:    zinc gluconate 50 MG tablet, Take 50 mg by mouth daily., Disp: , Rfl:   Patient Active Problem List   Diagnosis Date Noted   Peripheral edema 12/28/2019   Neuropathy 11/23/2019   Low serum vitamin B12 09/23/2019   Stage 3a chronic kidney disease ([place]) 09/23/2019   Controlled type 2 diabetes mellitus without complication, without long-term current use of insulin ([place]) 04/01/2019   History of kidney stones 10/14/2018   Insomnia 10/14/2018   Spinal stenosis of lumbar region 08/11/2009   Essential hypertension 01/14/2008   Class 2 severe obesity with serious comorbidity and body mass index (BMI) of 35.0 to 35.9 in adult ([place]) 12/25/2006   HLD (hyperlipidemia) 11/11/2006   Osteopenia 11/11/2006    Past Surgical History:  Procedure Laterality Date   BREAST BIOPSY Right 08/19/2012   neg, Dr. Bary Castilla   extraction of renal stones     TONSILLECTOMY AND ADENOIDECTOMY     VAGINAL HYSTERECTOMY  1980   class IV, ovaries intact    Family History  Problem Relation Age of Onset   Hypertension Mother    Dementia Mother    Stroke Mother    Cancer - Other Father        Bile Duct   Heart disease Maternal Grandmother    Heart disease Maternal Grandfather    Hypertension Paternal Grandmother    Cancer Maternal Aunt        breast and lung   Stroke Paternal Grandfather     Social History   Tobacco Use   Smoking status: Former    Packs/day: 0.25    Years: 30.00    Pack years: 7.50    Types: Cigarettes    Start date: 07/09/1983    Quit date: 05/27/2014    Years since quitting: 6.7   Smokeless tobacco: Never  Vaping Use   Vaping Use: Never used  Substance Use Topics   Alcohol use: Yes    Alcohol/week: 1.0 standard drink    Types: 1 Glasses of wine per week    Comment: occasional   Drug use: No     No Known Allergies  Health Maintenance  Topic Date Due   Zoster Vaccines- Shingrix (1 of 2) Never done   COVID-19  Vaccine (3 - Pfizer risk series) 05/11/2020   HEMOGLOBIN A1C  01/01/2021   INFLUENZA VACCINE  02/05/2021   FOOT EXAM  07/03/2021  PNA vac Low Risk Adult (2 of 2 - PCV13) 07/03/2021   OPHTHALMOLOGY EXAM  12/18/2021   MAMMOGRAM  01/29/2022   COLONOSCOPY (Pts 45-77yr Insurance coverage will need to be confirmed)  08/14/2022   DEXA SCAN  02/02/2023   Hepatitis C Screening  Completed   HPV VACCINES  Aged Out   TETANUS/TDAP  Discontinued    Chart Review Today: I personally reviewed active problem list, medication list, allergies, family history, social history, health maintenance, notes from last encounter, lab results, imaging with the patient/caregiver today.   Review of Systems  Constitutional: Negative.   HENT: Negative.    Eyes: Negative.   Respiratory: Negative.    Cardiovascular: Negative.   Gastrointestinal: Negative.   Endocrine: Negative.   Genitourinary: Negative.   Musculoskeletal: Negative.   Skin: Negative.   Allergic/Immunologic: Negative.   Neurological: Negative.   Hematological: Negative.   Psychiatric/Behavioral: Negative.    All other systems reviewed and are negative.   Objective:   Vitals:   03/02/21 0923  BP: 126/72  Pulse: 81  Resp: 16  Temp: 97.6 F (36.4 C)  SpO2: 95%  Weight: 226 lb 3.2 oz (102.6 kg)  Height: 5' 4"  (1.626 m)    Body mass index is 38.83 kg/m.  Physical Exam Vitals and nursing note reviewed.  Constitutional:      General: She is not in acute distress.    Appearance: Normal appearance. She is well-developed. She is obese. She is not ill-appearing, toxic-appearing or diaphoretic.     Interventions: Face mask in place.  HENT:     Head: Normocephalic and atraumatic.     Right Ear: External ear normal.     Left Ear: External ear normal.  Eyes:     General: Lids are normal. No scleral icterus.       Right eye: No discharge.        Left eye: No discharge.     Conjunctiva/sclera: Conjunctivae normal.  Neck:     Trachea:  Phonation normal. No tracheal deviation.  Cardiovascular:     Rate and Rhythm: Normal rate and regular rhythm.     Pulses: Normal pulses.          Radial pulses are 2+ on the right side and 2+ on the left side.       Posterior tibial pulses are 2+ on the right side and 2+ on the left side.     Heart sounds: Normal heart sounds. No murmur heard.   No friction rub. No gallop.  Pulmonary:     Effort: Pulmonary effort is normal. No respiratory distress.     Breath sounds: Normal breath sounds. No stridor. No wheezing, rhonchi or rales.  Chest:     Chest wall: No tenderness.  Abdominal:     General: Bowel sounds are normal. There is no distension.     Palpations: Abdomen is soft.  Musculoskeletal:     Right lower leg: No edema.     Left lower leg: No edema.  Skin:    General: Skin is warm and dry.     Coloration: Skin is not jaundiced or pale.     Findings: No rash.  Neurological:     Mental Status: She is alert.     Motor: No abnormal muscle tone.     Gait: Gait normal.  Psychiatric:        Mood and Affect: Mood normal.        Speech: Speech normal.  Behavior: Behavior normal.        Assessment & Plan:     ICD-10-CM   1. Essential hypertension  L07 COMPLETE METABOLIC PANEL WITH GFR   stable well controlled     2. Hyperlipidemia, unspecified hyperlipidemia type  E78.5 rosuvastatin (CRESTOR) 10 MG tablet   Patient has been compliant with Crestor and Zetia, no side effects or concerns    3. Aortic atherosclerosis (HCC)  I70.0 rosuvastatin (CRESTOR) 10 MG tablet   On statin, monitoring    4. Controlled type 2 diabetes mellitus without complication, without long-term current use of insulin (HCC)  E11.9 Hemoglobin A1C   has been well controlled, not on meds, weight up and worse diet, recheck A1C    5. Insomnia, unspecified type  G47.00    on trazodone - stable    6. Osteopenia of multiple sites  A15.18 COMPLETE METABOLIC PANEL WITH GFR    VITAMIN D 25 Hydroxy (Vit-D  Deficiency, Fractures)    Parathyroid hormone, intact (no Ca)   reviewed records, risk, fall risk and risk reduction and supplementation and weight bearing activity    7. Osteopenia with high risk of fracture  D43.73 COMPLETE METABOLIC PANEL WITH GFR    VITAMIN D 25 Hydroxy (Vit-D Deficiency, Fractures)    Parathyroid hormone, intact (no Ca)   high risk, discussed med options - would start after labs with fosamax if pt is willing to start tx    8. Vitamin D deficiency  H78.9 COMPLETE METABOLIC PANEL WITH GFR    VITAMIN D 25 Hydroxy (Vit-D Deficiency, Fractures)    Parathyroid hormone, intact (no Ca)    9. Stage 3a chronic kidney disease (HCC)  B84.78 COMPLETE METABOLIC PANEL WITH GFR    10. Low serum vitamin B12  E53.8 Vitamin B12   stopped supplements, recheck    11. Peripheral edema  S12.8 COMPLETE METABOLIC PANEL WITH GFR   moreso pedal edema - no wounds/sores, advised conservative mgmt, she has stockings    12. Neuropathy  G62.9 VITAMIN D 25 Hydroxy (Vit-D Deficiency, Fractures)    Vitamin B12   sx well controlled on gabapentin    13. Type 2 diabetes mellitus without complication, without long-term current use of insulin (HCC)  E11.9 Hemoglobin A1C    COMPLETE METABOLIC PANEL WITH GFR   see above       Return in about 6 months (around 09/02/2021).   Delsa Grana, PA-C 03/02/21 9:33 AM

## 2021-03-02 NOTE — Patient Instructions (Signed)
Preventing Osteoporosis, Adult Osteoporosis is a condition that causes the bones to lose density. This means that the bones become thinner, and the normal spaces in bone tissue become larger. Low bone density can make the bones weak and cause them to break moreeasily. Osteoporosis cannot always be prevented, but you can take steps to lower yourrisk of developing this condition. How can this condition affect me? If you develop osteoporosis, you will be more likely to break bones in your wrist, spine, or hip. Even a minor accident or injury can be enough to break weak bones. The bones will also be slower to heal. Osteoporosis can cause otherproblems as well, such as a stooped posture or trouble with movement. Osteoporosis can occur with aging. As you get older, you may lose bone tissue more quickly, or it may be replaced more slowly. Osteoporosis is more likely to develop if you have poor nutrition or do not get enough calcium or vitamin D. Other lifestyle factors can also play a role. By eating a well-balanced diet and making lifestyle changes, you can help keep your bones strong and healthy,lowering your chances of developing osteoporosis. What can increase my risk? The following factors may make you more likely to develop osteoporosis: Having a family history of the condition. Having poor nutrition or not getting enough calcium or vitamin D. Using certain medicines, such as steroid medicines or anti-seizure medicines. Being any of the following: 53 years of age or older. Female. A woman who has gone through menopause (is postmenopausal). A person who is of European or Asian descent. Using products that contain nicotine or tobacco, such as cigarettes, e-cigarettes, and chewing tobacco. Not being physically active (being sedentary). Having a small body frame. What actions can I take to prevent this? Get enough calcium  Make sure you get enough calcium every day. Calcium is the most important  mineral for bone health. Most people can get enough calcium from their diet, but supplements may be recommended for people who are at risk for osteoporosis. Follow these guidelines: If you are age 69 or younger, aim to get 1,000 milligrams (mg) of calcium every day. If you are older than age 7, aim to get 1,200 mg of calcium every day. Good sources of calcium include: Dairy products, such as low-fat or nonfat milk, cheese, and yogurt. Dark green leafy vegetables, such as bok choy and broccoli. Foods that have had calcium added to them (calcium-fortified foods), such as orange juice, cereal, bread, soy beverages, and tofu products. Nuts, such as almonds. Check nutrition labels to see how much calcium is in a food or drink.  Get enough vitamin D Try to get enough vitamin D every day. Vitamin D is the most essential vitamin for bone health. It helps the body absorb calcium. Follow these guidelines for how much vitamin D to get from food: If you are age 63 or younger, aim to get at least 600 international units (IU) every day. Your health care provider may suggest more. If you are older than age 79, aim to get at least 800 international units every day. Your health care provider may suggest more. Good sources of vitamin D in your diet include: Egg yolks. Oily fish, such as salmon, sardines, and tuna. Milk and cereal fortified with vitamin D. Your body also makes vitamin D when you are out in the sun. Exposing the bare skin on your face, arms, legs, or back to the sun for no more than 30 minutes a day, 2 times  a week is more than enough. Beyond that, make sure you use sunblock to protect your skin from sunburn, which increases your risk for skin cancer. Exercise  Stay active and get exercise every day. Ask your health care provider what types of exercise are best for you. Weight-bearing and strength-building activities are important for building and maintaining healthy bones. Some examples of these  types of activities include: Walking and hiking. Jogging and running. Dancing. Gym exercises and lifting weights. Tennis and racquetball. Climbing stairs. Tai chi.  Make other lifestyle changes Do not use any products that contain nicotine or tobacco, such as cigarettes, e-cigarettes, and chewing tobacco. If you need help quitting, ask your health care provider. Lose weight if you are overweight. If you drink alcohol: Limit how much you use to: 0-1 drink a day for women who are not pregnant. 0-2 drinks a day for men. Be aware of how much alcohol is in your drink. In the [place] one drink equals one 12 oz bottle of beer (355 mL), one 5 oz glass of wine (148 mL), or one 1 oz glass of hard liquor (44 mL). Where to find support If you need help making changes to prevent osteoporosis, talk with your health care provider. You can ask for a referral to a dietitian and a physicaltherapist. Where to find more information Learn more about osteoporosis from: NIH Osteoporosis and Related [place]: www.bones.SouthExposed.es U.S. Office on [employer] Health: [place]BeachSigns.tn [place]: EquipmentWeekly.com.ee Summary Osteoporosis is a condition that causes weak bones that are more likely to break. Eat a healthy diet, making sure you get enough calcium and vitamin D, and stay active by getting regular exercise to help prevent osteoporosis. Other ways to reduce your risk of osteoporosis include maintaining a healthy weight and avoiding alcohol and products that contain nicotine or tobacco. This information is not intended to replace advice given to you by your health care provider. Make sure you discuss any questions you have with your healthcare provider. Document Revised: 12/09/2019 Document Reviewed: 12/09/2019 Elsevier Patient Education  [place] protect organs, store calcium, anchor muscles, and support the whole body. Keeping your  bones strong is important, especially as you get older. Youcan take actions to help keep your bones strong and healthy. Why is keeping my bones healthy important?  Keeping your bones healthy is important because your body constantly replaces bone cells. Cells get old, and new cells take their place. As we age, we lose bone cells because the body may not be able to make enough new cells to replace the old cells. The amount of bone cells and bone tissue you have is referred toas bone mass. The higher your bone mass, the stronger your bones. The aging process leads to an overall loss of bone mass in the body, which can increase the likelihood of: Joint pain and stiffness. Broken bones. A condition in which the bones become weak and brittle (osteoporosis). A large decline in bone mass occurs in older adults. In women, it occurs aboutthe time of menopause. What actions can I take to keep my bones healthy? Good health habits are important for maintaining healthy bones. This includes eating nutritious foods and exercising regularly. To have healthy bones, you need to get enough of the right minerals and vitamins. Most nutrition experts recommend getting these nutrients from the foods that you eat. In some cases, taking supplements may also be recommended. Doing certain types of exercise isalso important  for bone health. What are the nutritional recommendations for healthy bones?  Eating a well-balanced diet with plenty of calcium and vitamin D will help to protect your bones. Nutritional recommendations vary from person to person. Ask your health care provider what is healthy for you. Here are some generalguidelines. Get enough calcium Calcium is the most important (essential) mineral for bone health. Most people can get enough calcium from their diet, but supplements may be recommended for people who are at risk for osteoporosis. Good sources of calcium include: Dairy products, such as low-fat or nonfat  milk, cheese, and yogurt. Dark green leafy vegetables, such as bok choy and broccoli. Calcium-fortified foods, such as orange juice, cereal, bread, soy beverages, and tofu products. Nuts, such as almonds. Follow these recommended amounts for daily calcium intake: Children, age 29-3: 700 mg. Children, age 27-8: 1,000 mg. Children, age 30-13: 1,300 mg. Teens, age 292-18: 1,300 mg. Adults, age 74-50: 1,000 mg. Adults, age 51-70: Men: 1,000 mg. Women: 1,200 mg. Adults, age 33 or older: 1,200 mg. Pregnant and breastfeeding females: Teens: 1,300 mg. Adults: 1,000 mg. Get enough vitamin D Vitamin D is the most essential vitamin for bone health. It helps the body absorb calcium. Sunlight stimulates the skin to make vitamin D, so be sure to get enough sunlight. If you live in a cold climate or you do not get outside often, your health care provider may recommend that you take vitamin D supplements. Good sources of vitamin D in your diet include: Egg yolks. Saltwater fish. Milk and cereal fortified with vitamin D. Follow these recommended amounts for daily vitamin D intake: Children and teens, age 29-18: 600 international units. Adults, age 34 or younger: 400-800 international units. Adults, age 279 or older: 800-1,000 international units. Get other important nutrients Other nutrients that are important for bone health include: Phosphorus. This mineral is found in meat, poultry, dairy foods, nuts, and legumes. The recommended daily intake for adult men and adult women is 700 mg. Magnesium. This mineral is found in seeds, nuts, dark green vegetables, and legumes. The recommended daily intake for adult men is 400-420 mg. For adult women, it is 310-320 mg. Vitamin K. This vitamin is found in green leafy vegetables. The recommended daily intake is 120 mg for adult men and 90 mg for adult women. What type of physical activity is best for building and maintaining healthybones? Weight-bearing and  strength-building activities are important for building and maintaining healthy bones. Weight-bearing activities cause muscles and bones to work against gravity. Strength-building activities increase the strength of the muscles that support bones. Weight-bearing and muscle-building activities include: Walking and hiking. Jogging and running. Dancing. Gym exercises. Lifting weights. Tennis and racquetball. Climbing stairs. Aerobics. Adults should get at least 30 minutes of moderate physical activity on most days. Children should get at least 60 minutes of moderate physical activity onmost days. Ask your health care provider what type of exercise is best for you. How can I find out if my bone mass is low? Bone mass can be measured with an X-ray test called a bone mineral density (BMD) test. This test is recommended for all women who are age 50 or older. It may also be recommended for: Men who are age 33 or older. People who are at risk for osteoporosis because of: Having bones that break easily. Having a long-term disease that weakens bones, such as kidney disease or rheumatoid arthritis. Having menopause earlier than normal. Taking medicine that weakens bones, such as steroids, thyroid  hormones, or hormone treatment for breast cancer or prostate cancer. Smoking. Drinking three or more alcoholic drinks a day. If you find that you have a low bone mass, you may be able to preventosteoporosis or further bone loss by changing your diet and lifestyle. Where can I find more information? For more information, check out the following websites: [place]: AviationTales.fr [employer] of Health: www.bones.SouthExposed.es International Osteoporosis Foundation: Administrator.iofbonehealth.org Summary The aging process leads to an overall loss of bone mass in the body, which can increase the likelihood of broken bones and osteoporosis. Eating a well-balanced diet with plenty of calcium  and vitamin D will help to protect your bones. Weight-bearing and strength-building activities are also important for building and maintaining strong bones. Bone mass can be measured with an X-ray test called a bone mineral density (BMD) test. This information is not intended to replace advice given to you by your health care provider. Make sure you discuss any questions you have with your healthcare provider. Document Revised: 07/21/2017 Document Reviewed: 07/21/2017 Elsevier Patient Education  2022 [employer].

## 2021-03-05 LAB — COMPLETE METABOLIC PANEL WITH GFR
AG Ratio: 1.4 (calc) (ref 1.0–2.5)
ALT: 16 U/L (ref 6–29)
AST: 16 U/L (ref 10–35)
Albumin: 3.9 g/dL (ref 3.6–5.1)
Alkaline phosphatase (APISO): 44 U/L (ref 37–153)
BUN: 15 mg/dL (ref 7–25)
CO2: 27 mmol/L (ref 20–32)
Calcium: 9.1 mg/dL (ref 8.6–10.4)
Chloride: 106 mmol/L (ref 98–110)
Creat: 0.77 mg/dL (ref 0.50–1.05)
Globulin: 2.8 g/dL (calc) (ref 1.9–3.7)
Glucose, Bld: 122 mg/dL — ABNORMAL HIGH (ref 65–99)
Potassium: 4.4 mmol/L (ref 3.5–5.3)
Sodium: 141 mmol/L (ref 135–146)
Total Bilirubin: 0.6 mg/dL (ref 0.2–1.2)
Total Protein: 6.7 g/dL (ref 6.1–8.1)
eGFR: 83 mL/min/{1.73_m2} (ref >=60)

## 2021-03-05 LAB — VITAMIN D 25 HYDROXY (VIT D DEFICIENCY, FRACTURES): Vit D, 25-Hydroxy: 47 ng/mL (ref 30–100)

## 2021-03-05 LAB — PARATHYROID HORMONE, INTACT (NO CA): PTH: 33 pg/mL (ref 16–77)

## 2021-03-05 LAB — HEMOGLOBIN A1C
Hgb A1c MFr Bld: 6.1 % of total Hgb — ABNORMAL HIGH (ref <5.7)
Mean Plasma Glucose: 128 mg/dL
eAG (mmol/L): 7.1 mmol/L

## 2021-03-05 LAB — VITAMIN B12: Vitamin B-12: 303 pg/mL (ref 200–1100)

## 2021-03-09 ENCOUNTER — Other Ambulatory Visit: Payer: Self-pay | Admitting: Family Medicine

## 2021-03-09 DIAGNOSIS — I700 Atherosclerosis of aorta: Secondary | ICD-10-CM

## 2021-03-09 DIAGNOSIS — E785 Hyperlipidemia, unspecified: Secondary | ICD-10-CM

## 2021-03-13 IMAGING — CR DG CHEST 2V
1 series · 2 of 2 positions shown · non-contrast
Comparison: 04/05/2019

CLINICAL DATA: Hypertension and shortness of breath

EXAM:
CHEST - 2 VIEW

[Series 1: dg chest 2 view · 0.14mm/px · 2 of 2 slices shown]
[im 1/2]
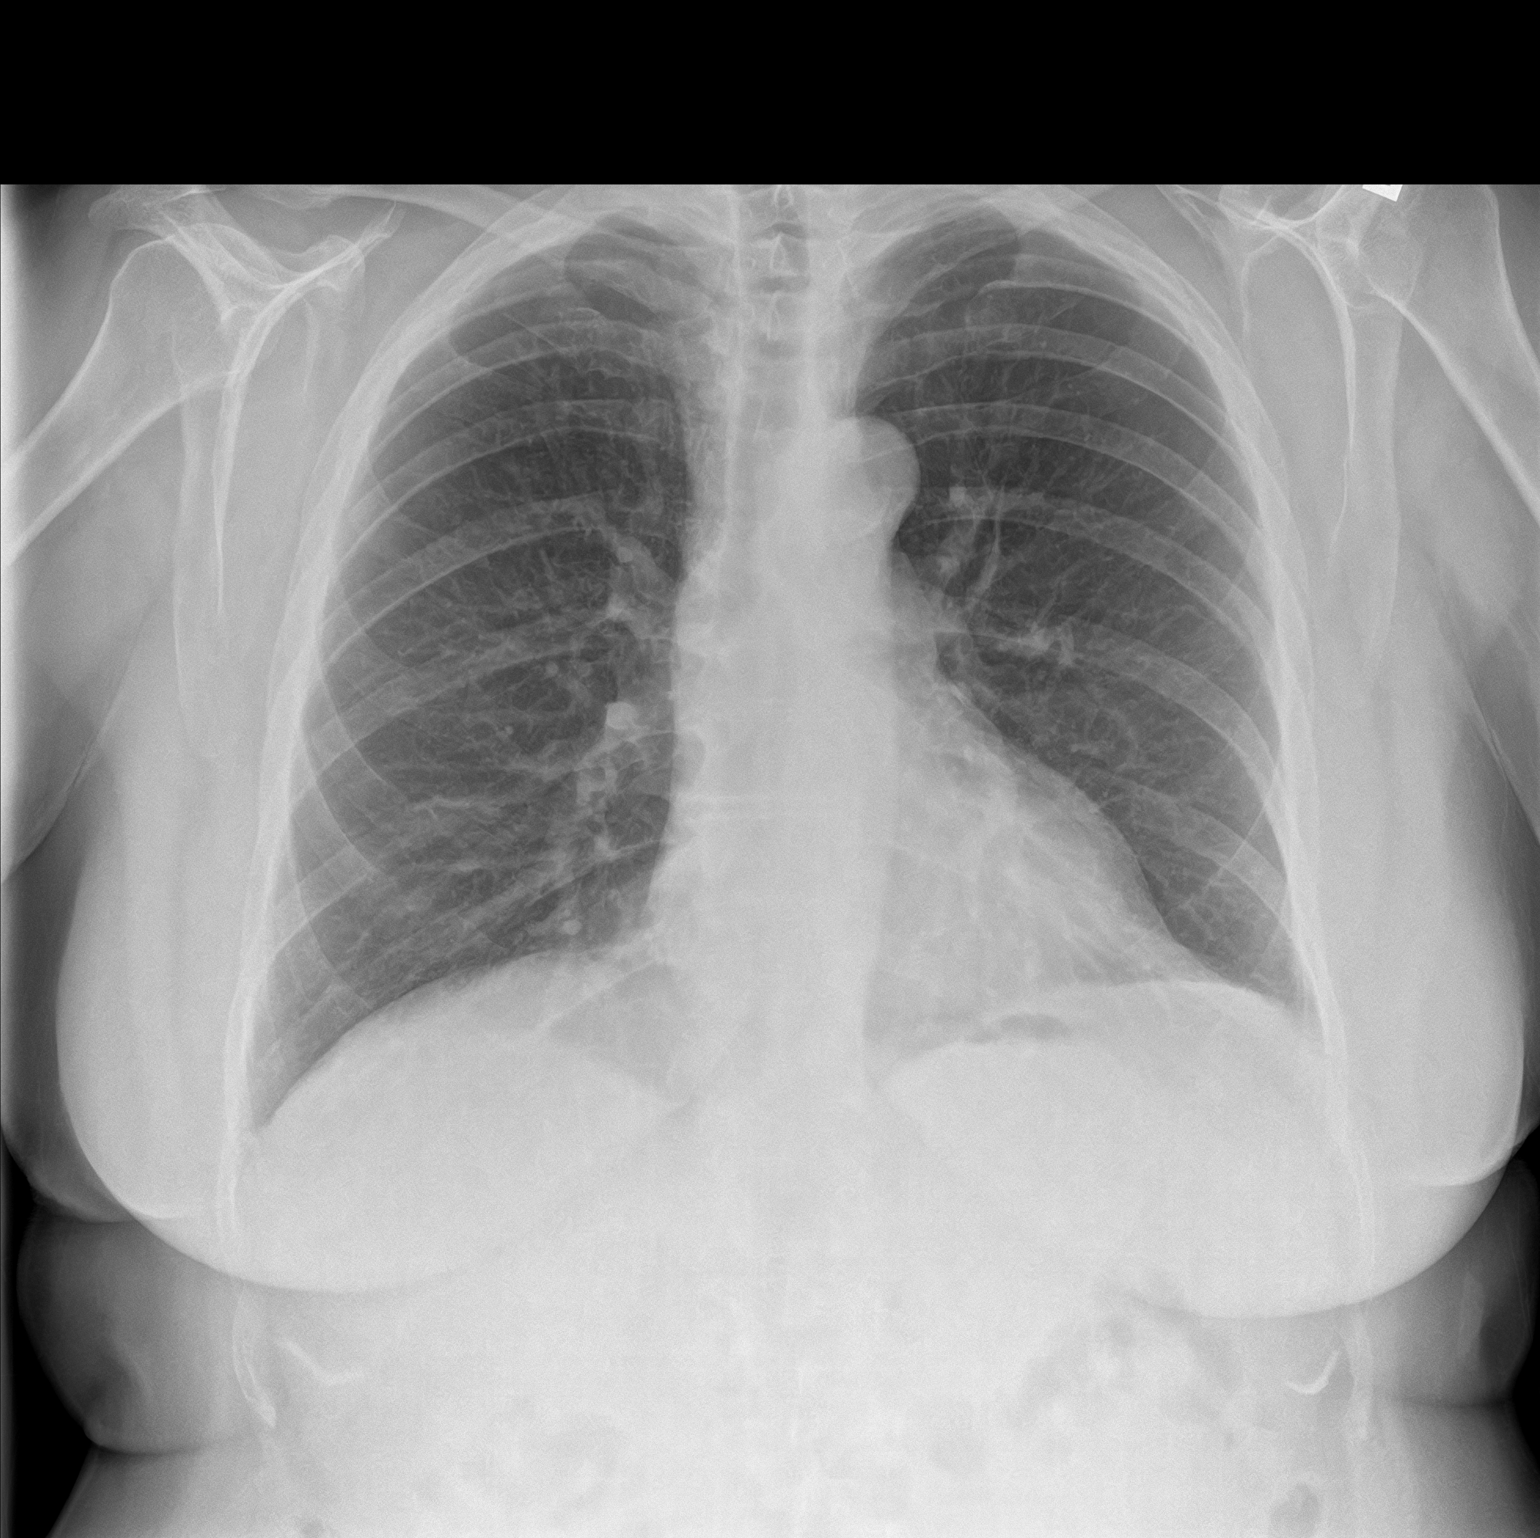
[im 2/2]
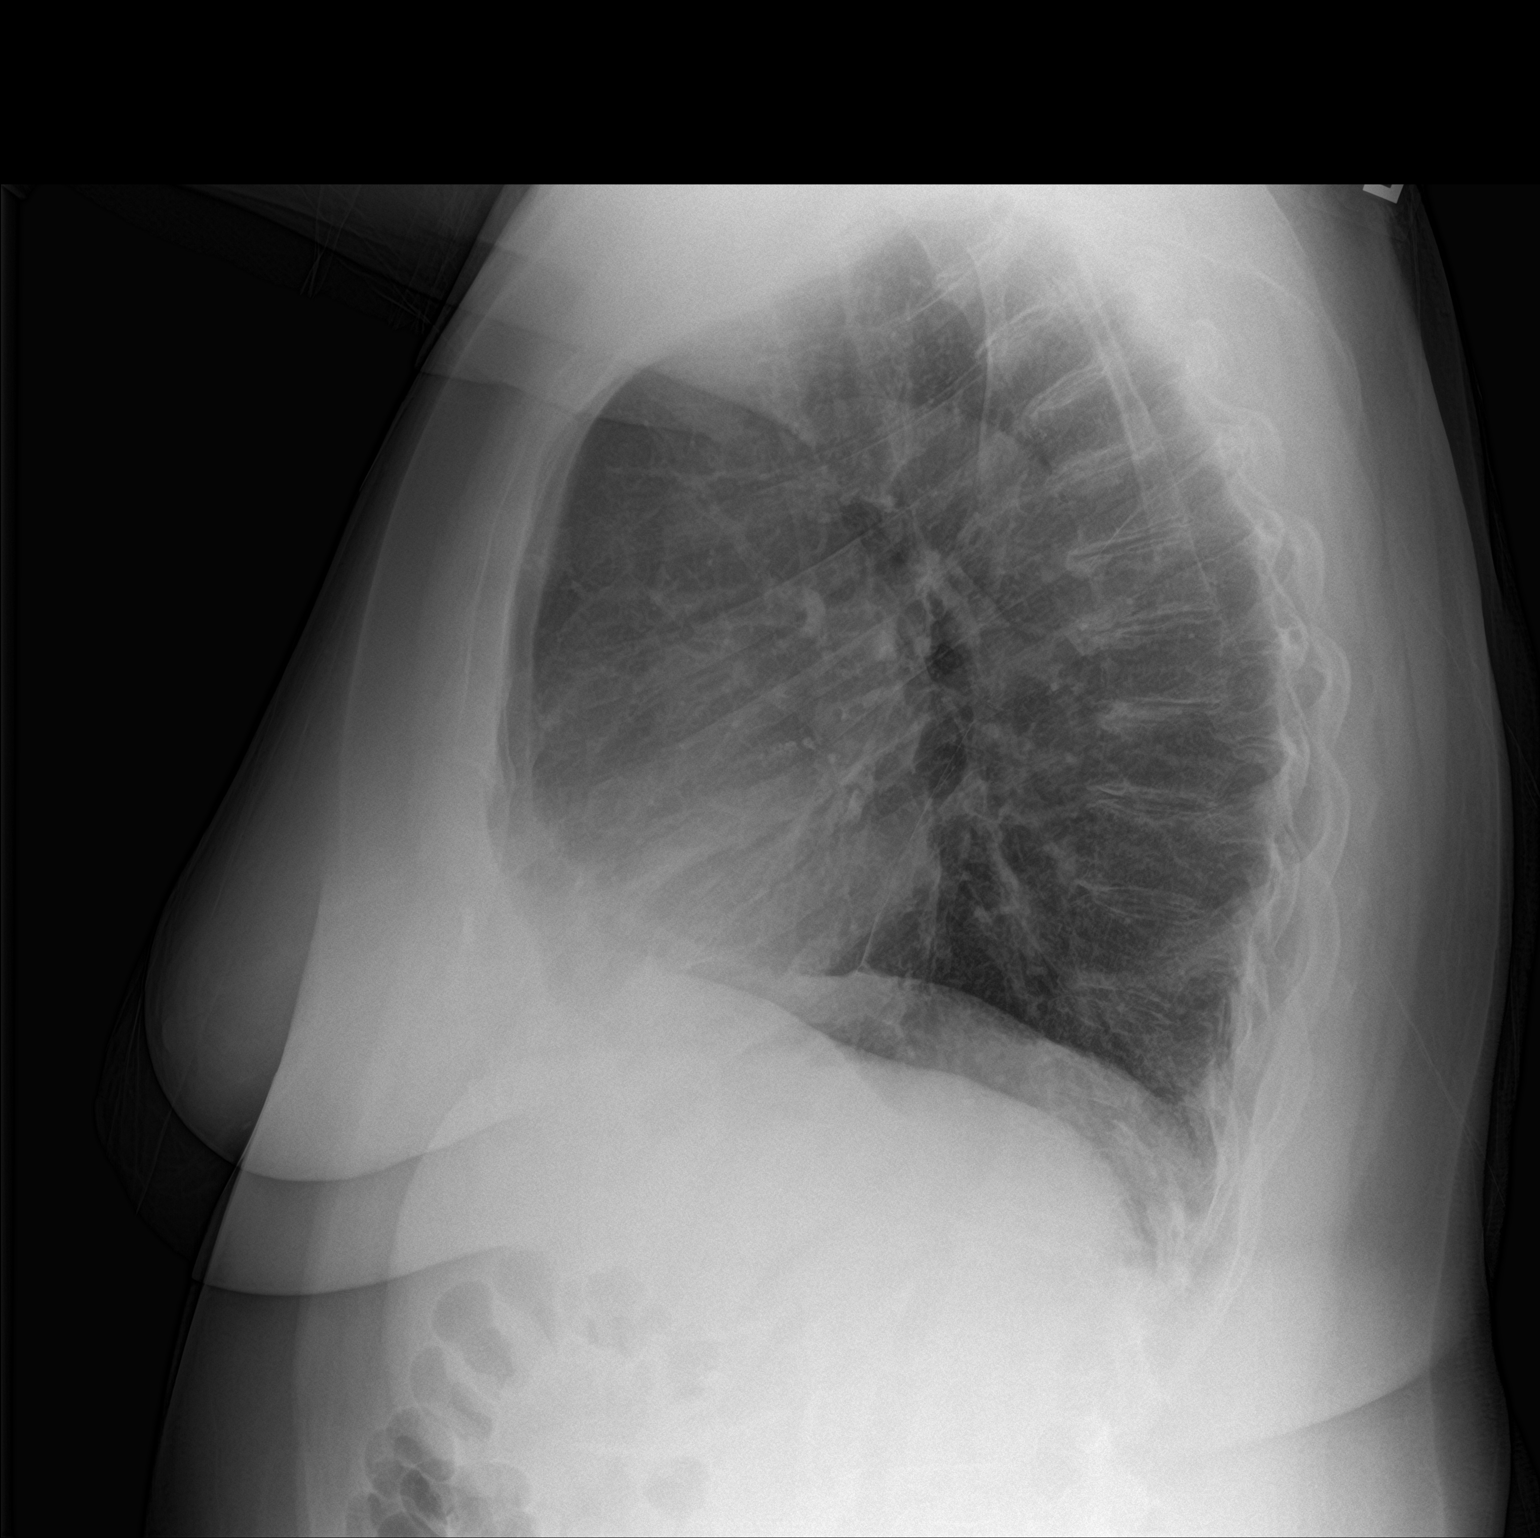

[2 of 2 positions shown; findings below may reference images not displayed]

FINDINGS: The heart size and mediastinal contours are within normal limits.
Both lungs are clear. The visualized skeletal structures are
unremarkable.
IMPRESSION: No active cardiopulmonary disease.

## 2021-03-13 NOTE — Progress Notes (Signed)
Cardiology Office Note:    Date:  03/14/2021   ID:  [name], DOB February 19, 1951, MRN [id number]  PCP:  Delsa Grana, PA-C  [facility] Cardiologist:  None  [facility] Electrophysiologist:  None   Referring MD: Delsa Grana, PA-C   Chief Complaint: palpitations  History of Present Illness:    Jacqueline [name] is a 70 y.o. female with a hx of HTN, HLD, borderline diabetes, former smoker (quit 2013), paroxysmal tachycardia who is being seen for palpitations.   Heart monitor showed predominately NSR with 5 runs of SVT with the longest interval lasting 6 beats, rare ectopy, triggered events with PVCs.   Last seen 08/2020 for palpitations on metoprolol. She was symptomatically stable.   Phone encounter 01/31/21 patient reported worsening palpitations and apt was made.   Today, the patient reports palpitations are better. She was taking her medications sporadically throughout the week. She recently started taking the medications more consistently which has greatly improved symptoms. When she has palpitations she feels her heart racing and feels short of breath. No chest pain.Worst episode was a month ago. Otherwise the palpitations occur randomly, no known triggers. Episodes were brief may be 2-3 minutes. Drinks little caffeine. She reports intermittent swelling on the feet for the last few months. She does eat moderate amounts of salt. Patient is at a desk most of the day. She uses compression socks which help. No orthopnea, pnd, fever, chills, nasuea, vomiting.   Past Medical History:  Diagnosis Date   Calcium oxalate renal stones    Diabetes mellitus without complication (HCC)    Hyperlipidemia    Hypertension    MVP (mitral valve prolapse)     Past Surgical History:  Procedure Laterality Date   BREAST BIOPSY Right 08/19/2012   neg, Dr. Bary Castilla   extraction of renal stones     TONSILLECTOMY AND ADENOIDECTOMY     VAGINAL HYSTERECTOMY  1980   class IV, ovaries intact     Current Medications: Current Meds  Medication Sig   aspirin 81 MG chewable tablet Chew 81 mg by mouth daily.   blood glucose meter kit and supplies KIT Lifescan meter Check blood sugar 2 times daily as needed   calcium carbonate (OS-CAL) 600 MG TABS tablet Take 600 mg by mouth daily with breakfast.   Cholecalciferol (VITAMIN D) 50 MCG (2000 UT) CAPS Take by mouth.   ezetimibe (ZETIA) 10 MG tablet Take 1 tablet (10 mg total) by mouth daily.   gabapentin (NEURONTIN) 300 MG capsule Take 300 mg by mouth 3 (three) times daily.   Lancets (ONETOUCH DELICA PLUS [id number]) MISC USE AS DIRECTED TWICE A DAY AS NEEDED   lisinopril (ZESTRIL) 10 MG tablet Take 1 tablet (10 mg total) by mouth daily.   Magnesium Oxide 500 MG CAPS Take 1 capsule by mouth daily.   metoprolol tartrate (LOPRESSOR) 25 MG tablet Take 1 tablet (25 mg total) by mouth 2 (two) times daily.   ONETOUCH ULTRA test strip USE AS DIRECTED TWICE A DAY AS NEEDED   Potassium Gluconate 595 MG CAPS Take 595 mg by mouth daily.   rosuvastatin (CRESTOR) 10 MG tablet Take 1 tablet (10 mg total) by mouth daily.   traZODone (DESYREL) 50 MG tablet Take 1 tablet (50 mg total) by mouth at bedtime.   VITAMIN E PO Take 800 Units by mouth daily.   zinc gluconate 50 MG tablet Take 50 mg by mouth daily.     Allergies:   Patient has no known  allergies.   Social History   Socioeconomic History   Marital status: Married    Spouse name: Richardson Landry   Number of children: 2   Years of education: Not on file   Highest education level: Some college, no degree  Occupational History   Occupation: Naval architect  Tobacco Use   Smoking status: Former    Packs/day: 0.25    Years: 30.00    Pack years: 7.50    Types: Cigarettes    Start date: 07/09/1983    Quit date: 05/27/2014    Years since quitting: 6.8   Smokeless tobacco: Never  Vaping Use   Vaping Use: Never used  Substance and Sexual Activity   Alcohol use: Yes    Alcohol/week: 1.0 standard  drink    Types: 1 Glasses of wine per week    Comment: occasional   Drug use: No   Sexual activity: Yes    Partners: Male  Other Topics Concern   Not on file  Social History Narrative   Not on file   Social Determinants of Health   Financial Resource Strain: Low Risk    Difficulty of Paying Living Expenses: Not hard at all  Food Insecurity: No Food Insecurity   Worried About Charity fundraiser in the Last Year: Never true   [place] in the Last Year: Never true  Transportation Needs: No Transportation Needs   Lack of Transportation (Medical): No   Lack of Transportation (Non-Medical): No  Physical Activity: Inactive   Days of Exercise per Week: 0 days   Minutes of Exercise per Session: 0 min  Stress: No Stress Concern Present   Feeling of Stress : Not at all  Social Connections: Socially Integrated   Frequency of Communication with Friends and Family: More than three times a week   Frequency of Social Gatherings with Friends and Family: More than three times a week   Attends Religious Services: More than 4 times per year   Active Member of [employer] or Organizations: Yes   Attends Music therapist: More than 4 times per year   Marital Status: Married     Family History: The patient's family history includes Cancer in her maternal aunt; Cancer - Other in her father; Dementia in her mother; Heart disease in her maternal grandfather and maternal grandmother; Hypertension in her mother and paternal grandmother; Stroke in her mother and paternal grandfather.  ROS:   Please see the history of present illness.     All other systems reviewed and are negative.  EKGs/Labs/Other Studies Reviewed:    The following studies were reviewed today:  Heart monitor 07/2019 Event Monitor   Normal sinus rhythm Avg HR of 79 bpm.    5 Supraventricular Tachycardia runs occurred, the run with the fastest interval lasting 5 beats with a max rate of 250 bpm, the longest  lasting 6 beats with an avg rate of 125 bpm.    Isolated SVEs were rare (<1.0%), SVE Couplets were rare (<1.0%), and SVE Triplets were rare (<1.0%). Isolated VEs were rare (<1.0%), VE Couplets were rare (<1.0%), and no VE Triplets were present. Ventricular Bigeminy and Trigeminy were present.   Patient triggered events associated with PVCs   Signed, Esmond Plants, MD, [facility]  EKG:  EKG is  ordered today.  The ekg ordered today demonstrates NSR, 74bpm, nonspecific T wave changes  Recent Labs: 07/03/2020: Hemoglobin 14.3; Platelets 243 03/02/2021: ALT 16; BUN 15; Creat 0.77; Potassium 4.4; Sodium  141  Recent Lipid Panel    Component Value Date/Time   CHOL 125 07/03/2020 1059   TRIG 122 07/03/2020 1059   HDL 50 07/03/2020 1059   CHOLHDL 2.5 07/03/2020 1059   VLDL 11.8 10/15/2018 0904   LDLCALC 55 07/03/2020 1059   LDLDIRECT 149.5 12/25/2006 0851    Physical Exam:    VS:  BP 110/70 (BP Location: Left Arm, Patient Position: Sitting, Cuff Size: Large)   Pulse 74   Ht 5' 4"  (1.626 m)   Wt 226 lb (102.5 kg)   BMI 38.79 kg/m     Wt Readings from Last 3 Encounters:  03/14/21 226 lb (102.5 kg)  03/02/21 226 lb 3.2 oz (102.6 kg)  08/14/20 220 lb (99.8 kg)     GEN:  Well nourished, well developed in no acute distress HEENT: Normal NECK: No JVD; No carotid bruits LYMPHATICS: No lymphadenopathy CARDIAC: RRR, no murmurs, rubs, gallops RESPIRATORY:  Clear to auscultation without rales, wheezing or rhonchi  ABDOMEN: Soft, non-tender, non-distended MUSCULOSKELETAL:  Trace lower leg edema; No deformity  SKIN: Warm and dry NEUROLOGIC:  Alert and oriented x 3 PSYCHIATRIC:  Normal affect   ASSESSMENT:    1. Paroxysmal tachycardia ([place])   2. Swelling of lower leg   3. Mixed hyperlipidemia   4. Essential hypertension   5. Palpitations    PLAN:    In order of problems listed above:  Palpitations H/o Paroxysmal tachycardia Reports palpitations improved with taking  medications daily at the same time. She still has rare brief episodes of palpitations, but they are very tolerable. No chest pain. No known triggers. Continue metoprolol. She will let us know if symptoms worsen, can increase BB for better symptoms control. Blood work reviewed from last week.  Lower leg swelling Reports a couple months of intermittent lower leg swelling. Trace edema on exam today. No prior echo. I will check an echo. Recommended low salt diet, leg elevation and compression socks. She denies orthopnea and pnd.   HLD Re-check fasting lipid panel at follow-up. LDL 55 06/2020. Continue Crestor. Diet and exercise encouraged.  Borderline diabetes A1C 6.1 02/2021. Followed by PCP. Lifestyle changes encouraged.   Disposition: Follow up in 3 month(s) with MD/APP    Signed, Erie Radu Ninfa Meeker, PA-C  03/14/2021 9:23 AM    [place]

## 2021-03-14 ENCOUNTER — Ambulatory Visit: Payer: Medicare HMO | Admitting: Medical

## 2021-03-14 ENCOUNTER — Other Ambulatory Visit: Payer: Self-pay

## 2021-03-14 ENCOUNTER — Encounter: Payer: Self-pay | Admitting: Medical

## 2021-03-14 VITALS — BP 110/70 | HR 74 | Ht 64.0 in | Wt 226.0 lb

## 2021-03-14 DIAGNOSIS — I10 Essential (primary) hypertension: Secondary | ICD-10-CM | POA: Diagnosis not present

## 2021-03-14 DIAGNOSIS — E782 Mixed hyperlipidemia: Secondary | ICD-10-CM

## 2021-03-14 DIAGNOSIS — M7989 Other specified soft tissue disorders: Secondary | ICD-10-CM | POA: Diagnosis not present

## 2021-03-14 DIAGNOSIS — R002 Palpitations: Secondary | ICD-10-CM

## 2021-03-14 DIAGNOSIS — I479 Paroxysmal tachycardia, unspecified: Secondary | ICD-10-CM | POA: Diagnosis not present

## 2021-03-14 NOTE — Patient Instructions (Signed)
Medication Instructions:  - Your physician recommends that you continue on your current medications as directed. Please refer to the Current Medication list given to you today.  *If you need a refill on your cardiac medications before your next appointment, please call your pharmacy*   Lab Work: - Your physician recommends that you return for a FASTING lipid profile: to be done the day of your echocardiogram  Nothing to eat/ drink for 8 hours prior to your lab draw, except for water or black coffee. You may take all of your regular medications the day of.  If you have labs (blood work) drawn today and your tests are completely normal, you will receive your results only by: [place] (if you have MyChart) OR A paper copy in the mail If you have any lab test that is abnormal or we need to change your treatment, we will call you to review the results.   Testing/Procedures: - Your physician has requested that you have an echocardiogram. Echocardiography is a painless test that uses sound waves to create images of your heart. It provides your doctor with information about the size and shape of your heart and how well your heart's chambers and valves are working. This procedure takes approximately one hour. There are no restrictions for this procedure. There is a possibility that an IV may need to be started during your test to inject an image enhancing agent. This is done to obtain more optimal pictures of your heart. Therefore we ask that you do at least drink some water prior to coming in to hydrate your veins.      Follow-Up: At [facility], you and your health needs are our priority.  As part of our continuing mission to provide you with exceptional heart care, we have created designated Provider Care Teams.  These Care Teams include your primary Cardiologist (physician) and Advanced Practice Providers (APPs -  Physician Assistants and Nurse Practitioners) who all work together to  provide you with the care you need, when you need it.  We recommend signing up for the patient portal called "MyChart".  Sign up information is provided on this After Visit Summary.  MyChart is used to connect with patients for Virtual Visits (Telemedicine).  Patients are able to view lab/test results, encounter notes, upcoming appointments, etc.  Non-urgent messages can be sent to your provider as well.   To learn more about what you can do with MyChart, go to [email or web address].    Your next appointment:   3 month(s)  The format for your next appointment:   In Person  Provider:   You may see Ida Rogue, MD or one of the following Advanced Practice Providers on your designated Care Team:    Cadence Kathlen Mody, [place]   Other Instructions Echocardiogram An echocardiogram is a test that uses sound waves (ultrasound) to produce images of the heart. Images from an echocardiogram can provide important information about: Heart size and shape. The size and thickness and movement of your heart's walls. Heart muscle function and strength. Heart valve function or if you have stenosis. Stenosis is when the heart valves are too narrow. If blood is flowing backward through the heart valves (regurgitation). A tumor or infectious growth around the heart valves. Areas of heart muscle that are not working well because of poor blood flow or injury from a heart attack. Aneurysm detection. An aneurysm is a weak or damaged part of an artery wall. The wall bulges out from the normal force  of blood pumping through the body. Tell a health care provider about: Any allergies you have. All medicines you are taking, including vitamins, herbs, eye drops, creams, and over-the-counter medicines. Any blood disorders you have. Any surgeries you have had. Any medical conditions you have. Whether you are pregnant or may be pregnant. What are the risks? Generally, this is a safe test. However, problems may  occur, including an allergic reaction to dye (contrast) that may be used during the test. What happens before the test? No specific preparation is needed. You may eat and drink normally. What happens during the test?  You will take off your clothes from the waist up and put on a hospital gown. Electrodes or electrocardiogram (ECG)patches may be placed on your chest. The electrodes or patches are then connected to a device that monitors your heart rate and rhythm. You will lie down on a table for an ultrasound exam. A gel will be applied to your chest to help sound waves pass through your skin. A handheld device, called a transducer, will be pressed against your chest and moved over your heart. The transducer produces sound waves that travel to your heart and bounce back (or "echo" back) to the transducer. These sound waves will be captured in real-time and changed into images of your heart that can be viewed on a video monitor. The images will be recorded on a computer and reviewed by your health care provider. You may be asked to change positions or hold your breath for a short time. This makes it easier to get different views or better views of your heart. In some cases, you may receive contrast through an IV in one of your veins. This can improve the quality of the pictures from your heart. The procedure may vary among health care providers and hospitals. What can I expect after the test? You may return to your normal, everyday life, including diet, activities, and medicines, unless your health care provider tells you not to do that. Follow these instructions at home: It is up to you to get the results of your test. Ask your health care provider, or the department that is doing the test, when your results will be ready. Keep all follow-up visits. This is important. Summary An echocardiogram is a test that uses sound waves (ultrasound) to produce images of the heart. Images from an  echocardiogram can provide important information about the size and shape of your heart, heart muscle function, heart valve function, and other possible heart problems. You do not need to do anything to prepare before this test. You may eat and drink normally. After the echocardiogram is completed, you may return to your normal, everyday life, unless your health care provider tells you not to do that. This information is not intended to replace advice given to you by your health care provider. Make sure you discuss any questions you have with your health care provider. Document Revised: 02/15/2020 Document Reviewed: 02/15/2020 Elsevier Patient Education  2022 [employer].

## 2021-03-16 ENCOUNTER — Telehealth: Payer: Self-pay

## 2021-03-16 NOTE — Progress Notes (Signed)
Chronic Care Management Pharmacy Assistant   Name: Jacqueline [name] [facility]  MRN: [id number] DOB: 22-Jan-1951  Reason for Encounter: Medication Review/Medication Coordination Call   Recent office visits:  03/02/2021 Verline Lema, PA-C (PCP Office Visit) for Follow-up- No medication changes noted; lab orders placed; patient instructed to return in 6 months.   Recent consult visits:  03/14/2021 Cadence Kathlen Mody, PA-C (Cardiology) for Paroxysmal Tachycardia- No medication changes noted; Lipid Profile ordered; Echocardiogram ordered; EKG ordered; patient instructed to follow-up in 3 months.   Hospital visits:  None in previous 6 months  Medications: Outpatient Encounter Medications as of 03/16/2021  Medication Sig   aspirin 81 MG chewable tablet Chew 81 mg by mouth daily.   blood glucose meter kit and supplies KIT Lifescan meter Check blood sugar 2 times daily as needed   calcium carbonate (OS-CAL) 600 MG TABS tablet Take 600 mg by mouth daily with breakfast.   Cholecalciferol (VITAMIN D) 50 MCG (2000 UT) CAPS Take by mouth.   ezetimibe (ZETIA) 10 MG tablet Take 1 tablet (10 mg total) by mouth daily.   gabapentin (NEURONTIN) 300 MG capsule Take 300 mg by mouth 3 (three) times daily.   Lancets (ONETOUCH DELICA PLUS [id number]) MISC USE AS DIRECTED TWICE A DAY AS NEEDED   lisinopril (ZESTRIL) 10 MG tablet Take 1 tablet (10 mg total) by mouth daily.   Magnesium Oxide 500 MG CAPS Take 1 capsule by mouth daily.   metoprolol tartrate (LOPRESSOR) 25 MG tablet Take 1 tablet (25 mg total) by mouth 2 (two) times daily.   ONETOUCH ULTRA test strip USE AS DIRECTED TWICE A DAY AS NEEDED   Potassium Gluconate 595 MG CAPS Take 595 mg by mouth daily.   rosuvastatin (CRESTOR) 10 MG tablet Take 1 tablet (10 mg total) by mouth daily.   traZODone (DESYREL) 50 MG tablet Take 1 tablet (50 mg total) by mouth at bedtime.   VITAMIN E PO Take 800 Units by mouth daily.   zinc gluconate 50 MG tablet Take 50 mg by mouth  daily.   No facility-administered encounter medications on file as of 03/16/2021.   Care Gaps: Zoster Vaccines COVID-19 Booster 3 Influenza Vaccine (last completed 07/03/2020)  Star Rating Drugs: Rosuvastatin 10 mg last filled on 12/26/2020 for a 90-Day supply with [facility] Lisinopril 10 mg last filled on 02/14/2021 for a 36-Day supply with [facility]  Reviewed chart for medication changes ahead of medication coordination call.  No OVs, Consults, or hospital visits since last care coordination call/Pharmacist visit. (If appropriate, list visit date, provider name)  No medication changes indicated OR if recent visit, treatment plan here.  BP Readings from Last 3 Encounters:  03/14/21 110/70  03/02/21 126/72  08/14/20 122/72    Lab Results  Component Value Date   HGBA1C 6.1 (H) 03/02/2021     Patient obtains medications through Adherence Packaging  30 Days   Last adherence delivery included: This is her first fill   Patient declined medications: No medications declined last month as this is her first time getting her medications through [facility].  Patient is due for next adherence delivery on: 03/26/2021 (Monday) 1st route between (11 am- 3 pm).  Called patient and reviewed medications and coordinated delivery.  This delivery to include: Lisinopril 10 mg 1 tablet daily with breakfast Metoprolol Tartrate 25 mg 1 tablet twice daily with breakfast and evening meals Trazodone 50 mg 1 tablet at bedtime Gabapentin 300 mg 1 tablet three times daily at breakfast, lunch, and  evening meals Rosuvastatin 10 mg 1 tablet daily with breakfast   Patient declined the following medications: Zetia 10 mg tablet 1 tablet daily (patient is getting this medication at [facility] as it is cheaper for her)  Patient needs refills for None.  Confirmed delivery date of 03/26/2021 [address] Route, advised patient that pharmacy will contact them the morning of delivery.  Patient has a scheduled  appointment with [name], CPP on 05/02/2021 _0  Patient has a scheduled AWV for 08/07/2021 _1   Patient reports that her palpations have resolved and she is doing much better. She reports that her Cardiologist advised her to take her medications at the same time everyday and since she has been doing this her symptoms have improved and she feels much better.   Lynann Bologna, CPA/CMA Clinical Pharmacist Assistant Phone: [phone]

## 2021-03-21 ENCOUNTER — Other Ambulatory Visit: Payer: Self-pay

## 2021-03-21 ENCOUNTER — Ambulatory Visit (INDEPENDENT_AMBULATORY_CARE_PROVIDER_SITE_OTHER): Payer: Medicare HMO

## 2021-03-21 DIAGNOSIS — E538 Deficiency of other specified B group vitamins: Secondary | ICD-10-CM | POA: Diagnosis not present

## 2021-03-21 MED ORDER — CYANOCOBALAMIN 1000 MCG/ML IJ SOLN
1000.0000 ug | Freq: Once | INTRAMUSCULAR | Status: AC
  Administered 2021-03-21: 1000 ug via INTRAMUSCULAR

## 2021-04-12 ENCOUNTER — Ambulatory Visit (INDEPENDENT_AMBULATORY_CARE_PROVIDER_SITE_OTHER): Payer: Medicare HMO

## 2021-04-12 ENCOUNTER — Other Ambulatory Visit: Payer: Self-pay

## 2021-04-12 ENCOUNTER — Other Ambulatory Visit (INDEPENDENT_AMBULATORY_CARE_PROVIDER_SITE_OTHER): Payer: Medicare HMO

## 2021-04-12 DIAGNOSIS — M7989 Other specified soft tissue disorders: Secondary | ICD-10-CM

## 2021-04-12 DIAGNOSIS — R600 Localized edema: Secondary | ICD-10-CM | POA: Diagnosis not present

## 2021-04-12 DIAGNOSIS — E782 Mixed hyperlipidemia: Secondary | ICD-10-CM

## 2021-04-12 LAB — ECHOCARDIOGRAM COMPLETE
AR max vel: 2.24 cm2
AV Area VTI: 2.32 cm2
AV Area mean vel: 2.09 cm2
AV Mean grad: 3 mmHg
AV Peak grad: 5.3 mmHg
Ao pk vel: 1.15 m/s
Area-P 1/2: 3.1 cm2
Single Plane A4C EF: 57.5 %

## 2021-04-12 MED ORDER — PERFLUTREN LIPID MICROSPHERE
1.0000 mL | INTRAVENOUS | Status: AC | PRN
  Administered 2021-04-12: 2 mL via INTRAVENOUS

## 2021-04-13 ENCOUNTER — Telehealth: Payer: Self-pay

## 2021-04-13 LAB — LIPID PANEL
Chol/HDL Ratio: 2.7 ratio (ref 0.0–4.4)
Cholesterol, Total: 126 mg/dL (ref 100–199)
HDL: 47 mg/dL (ref >39)
LDL Chol Calc (NIH): 62 mg/dL (ref 0–99)
Triglycerides: 90 mg/dL (ref 0–149)
VLDL Cholesterol Cal: 17 mg/dL (ref 5–40)

## 2021-04-13 NOTE — Progress Notes (Addendum)
Chronic Care Management Pharmacy Assistant   Name: Jacqueline [name] [facility]  MRN: [id number] DOB: June 23, 1951  Reason for Encounter: Medication Review/Medication Coordination Call  Recent office visits:  None ID  Recent consult visits:  None ID  Hospital visits:  None in previous 6 months  Medications: Outpatient Encounter Medications as of 04/13/2021  Medication Sig   aspirin 81 MG chewable tablet Chew 81 mg by mouth daily.   blood glucose meter kit and supplies KIT Lifescan meter Check blood sugar 2 times daily as needed   calcium carbonate (OS-CAL) 600 MG TABS tablet Take 600 mg by mouth daily with breakfast.   Cholecalciferol (VITAMIN D) 50 MCG (2000 UT) CAPS Take by mouth.   ezetimibe (ZETIA) 10 MG tablet Take 1 tablet (10 mg total) by mouth daily.   gabapentin (NEURONTIN) 300 MG capsule Take 300 mg by mouth 3 (three) times daily.   Lancets (ONETOUCH DELICA PLUS [id number]) MISC USE AS DIRECTED TWICE A DAY AS NEEDED   lisinopril (ZESTRIL) 10 MG tablet Take 1 tablet (10 mg total) by mouth daily.   Magnesium Oxide 500 MG CAPS Take 1 capsule by mouth daily.   metoprolol tartrate (LOPRESSOR) 25 MG tablet Take 1 tablet (25 mg total) by mouth 2 (two) times daily.   ONETOUCH ULTRA test strip USE AS DIRECTED TWICE A DAY AS NEEDED   Potassium Gluconate 595 MG CAPS Take 595 mg by mouth daily.   rosuvastatin (CRESTOR) 10 MG tablet Take 1 tablet (10 mg total) by mouth daily.   traZODone (DESYREL) 50 MG tablet Take 1 tablet (50 mg total) by mouth at bedtime.   VITAMIN E PO Take 800 Units by mouth daily.   zinc gluconate 50 MG tablet Take 50 mg by mouth daily.   No facility-administered encounter medications on file as of 04/13/2021.   Care Gaps: Zoster Vaccines COVID-19 Vaccine Booster 3 Influenza Vaccine (Last completed 07/03/2020)   Star Rating Drugs: Rosuvastatin 10 mg last filled on 03/21/2021 with [facility] for a 30-Day supply Lisinopril 10 mg last filled on 03/21/2021  with [facility] for a 30-Day supply  BP Readings from Last 3 Encounters:  03/14/21 110/70  03/02/21 126/72  08/14/20 122/72    Lab Results  Component Value Date   HGBA1C 6.1 (H) 03/02/2021     Patient obtains medications through Adherence Packaging  30 Days   Last adherence delivery included: Lisinopril 10 mg 1 tablet daily with Breakfast Metoprolol Tartrate 25 mg 1 tablet twice daily with Breakfast and Evening meals Trazodone 50 mg 1 tablet at Bedtime Gabapentin 300 mg 1 tablet three times daily at Breakfast, Lunch, and Evening meals Rosuvastatin 10 mg 1 tablet daily with Breakfast   Patient declined medications last month:  No Medications declined  Patient is due for next adherence delivery on: 04/24/2021 (Tuesday) [address] Route.  Called patient and reviewed medications and coordinated delivery.  This delivery to include: Lisinopril 10 mg 1 tablet daily with Breakfast Metoprolol Tartrate 25 mg 1 tablet twice daily with Breakfast and Evening meals Gabapentin 300 mg 1 tablet three times daily at Breakfast, Lunch, and Evening meals Trazodone 50 mg 1 tablet at Bedtime Rosuvastatin 10 mg 1 tablet daily with Breakfast   Patient declined the following medications: Ezetimibe- patient fills this at [facility] as it is cheaper for her than at [facility].   Patient needs refills for: None at this time  Confirmed delivery date of 04/24/2021 [address], advised patient that pharmacy will contact them the morning  of delivery.  Patient has an upcoming appointment with [name], CPP on 05/02/2021 @0815   Lynann Bologna, CPA/CMA Clinical Pharmacist Assistant Phone: [phone]

## 2021-04-24 ENCOUNTER — Other Ambulatory Visit: Payer: Self-pay

## 2021-04-24 ENCOUNTER — Ambulatory Visit (INDEPENDENT_AMBULATORY_CARE_PROVIDER_SITE_OTHER): Payer: Medicare HMO | Admitting: Emergency Medicine

## 2021-04-24 DIAGNOSIS — E538 Deficiency of other specified B group vitamins: Secondary | ICD-10-CM | POA: Diagnosis not present

## 2021-04-24 MED ORDER — CYANOCOBALAMIN 1000 MCG/ML IJ SOLN
1000.0000 ug | Freq: Once | INTRAMUSCULAR | Status: AC
  Administered 2021-04-24: 1000 ug via INTRAMUSCULAR

## 2021-04-24 NOTE — Progress Notes (Signed)
B12 injection given

## 2021-05-01 ENCOUNTER — Telehealth: Payer: Self-pay

## 2021-05-01 NOTE — Progress Notes (Signed)
    Chronic Care Management Pharmacy Assistant   Name: Jacqueline [name] [facility]  MRN: [id number] DOB: February 10, 1951  Patient called to be reminded of her telephone appointment with [name], CPP on 05/02/2021 @ 0815.  Patient aware of appointment date, time, and type of appointment (either telephone or in person). Patient aware to have/bring all medications, supplements, blood pressure and/or blood sugar logs to visit.  Questions: Are there any concerns you would like to discuss during your office visit? Not at this time  Are you having any problems obtaining your medications? No   Star Rating Drug: Rosuvastatin 10 mg last filled on 04/17/2021 for a 30-Day supply with [facility] Lisinopril 10 mg last filled on 04/17/2021 for a 30-Day supply with [facility]  Any gaps in medications fill history?No  Care Gaps: Zoster Vaccines COVID-19 Vaccine Booster 3 Influenza Vaccine (Last completed 07/03/2020)   Lynann Bologna, CPA/CMA Clinical Pharmacist Assistant Phone: [phone]

## 2021-05-02 ENCOUNTER — Ambulatory Visit (INDEPENDENT_AMBULATORY_CARE_PROVIDER_SITE_OTHER): Payer: Medicare HMO

## 2021-05-02 DIAGNOSIS — E785 Hyperlipidemia, unspecified: Secondary | ICD-10-CM

## 2021-05-02 DIAGNOSIS — M8589 Other specified disorders of bone density and structure, multiple sites: Secondary | ICD-10-CM

## 2021-05-02 NOTE — Progress Notes (Signed)
Chronic Care Management Pharmacy Note  05/07/2021 Name:  Jacqueline [name] [facility] MRN:  [id number] DOB:  [birth year]  Summary: Patient presents for CCM follow-up.    Recommendations/Changes made from today's visit: Continue current medications   Plan: CPP 6 month follow-up   Subjective: Jacqueline [name] is an 70 y.o. year old female who is a primary patient of Delsa Grana, [place].  The CCM team was consulted for assistance with disease management and care coordination needs.    Engaged with patient by telephone for follow up visit in response to provider referral for pharmacy case management and/or care coordination services.   Consent to Services:  The patient was given information about Chronic Care Management services, agreed to services, and gave verbal consent prior to initiation of services.  Please see initial visit note for detailed documentation.   Patient Care Team: Delsa Grana, PA-C as PCP - General (Family Medicine) Bary Castilla [place] Gleason, MD (General Surgery) Germaine Pomfret, [facility] as Pharmacist (Pharmacist)  Recent office visits: 03/02/21: Patient presented to Delsa Grana, PA-C for follow-up. No medication changes noted.  08/03/20: Patient presented to Clemetine Marker, LPN for AWV.   Recent consult visits: 03/14/21: Patient presented to Cadence Jorene Minors (Cardiology) for follow-up.  11/22/20: Patient presented to Luella Cook, [place] (Neurology) for Leg Cramping. Gabapentin de to 300 mg twice daily.  10/02/20: Patient presented to Dr. Rochele Pages (Dermatology)  08/14/20: Patient presented to Dr. Rockey Situ (Cardiology) for follow-up.   Hospital visits: None in previous 6 months   Objective:  Lab Results  Component Value Date   CREATININE 0.77 03/02/2021   BUN 15 03/02/2021   GFR 45.58 (L) 04/30/2019   GFRNONAA 71 07/03/2020   GFRAA 83 07/03/2020   NA 141 03/02/2021   K 4.4 03/02/2021   CALCIUM 9.1 03/02/2021   CO2 27 03/02/2021   GLUCOSE 122 (H) 03/02/2021    Lab Results   Component Value Date/Time   HGBA1C 6.1 (H) 03/02/2021 10:06 AM   HGBA1C 6.2 (H) 07/03/2020 10:59 AM   GFR 45.58 (L) 04/30/2019 09:03 AM   GFR 63.97 03/24/2019 03:00 PM   MICROALBUR <0.2 12/28/2019 10:30 AM   MICROALBUR <0.2 06/25/2019 12:00 AM    Last diabetic Eye exam:  Lab Results  Component Value Date/Time   HMDIABEYEEXA No Retinopathy 12/18/2020 12:00 AM    Last diabetic Foot exam: No results found for: HMDIABFOOTEX   Lab Results  Component Value Date   CHOL 126 04/12/2021   HDL 47 04/12/2021   LDLCALC 62 04/12/2021   LDLDIRECT 149.5 12/25/2006   TRIG 90 04/12/2021   CHOLHDL 2.7 04/12/2021    Hepatic Function Latest Ref Rng & Units 03/02/2021 07/03/2020 12/28/2019  Total Protein 6.1 - 8.1 g/dL 6.7 6.8 6.7  Albumin 3.8 - 4.8 g/dL - - -  AST 10 - 35 U/L 16 19 18   ALT 6 - 29 U/L 16 19 19   Alk Phosphatase 39 - 117 IU/L - - -  Total Bilirubin 0.2 - 1.2 mg/dL 0.6 0.5 0.5  Bilirubin, Direct 0.0 - 0.3 mg/dL - - -    Lab Results  Component Value Date/Time   TSH 1.63 12/28/2019 10:30 AM   TSH 1.08 06/25/2019 12:00 AM    CBC Latest Ref Rng & Units 07/03/2020 12/28/2019 06/25/2019  WBC 3.8 - 10.8 Thousand/uL 8.5 8.4 9.0  Hemoglobin 11.7 - 15.5 g/dL 14.3 13.7 13.7  Hematocrit 35.0 - 45.0 % 43.1 40.5 40.5  Platelets 140 - 400 Thousand/uL 243 217 295  Lab Results  Component Value Date/Time   VD25OH 47 03/02/2021 10:06 AM   VD25OH 26.17 (L) 09/25/2017 09:16 AM    Clinical ASCVD: No  The ASCVD Risk score (Arnett DK, et al., 2019) failed to calculate for the following reasons:   The valid total cholesterol range is 130 to 320 mg/dL    Depression screen [facility] 2/9 03/02/2021 08/03/2020 07/03/2020  Decreased Interest 0 0 0  Down, Depressed, Hopeless 0 0 0  PHQ - 2 Score 0 0 0  Altered sleeping 0 - -  Tired, decreased energy 0 - -  Change in appetite 0 - -  Feeling bad or failure about yourself  0 - -  Trouble concentrating 0 - -  Moving slowly or fidgety/restless 0 - -   Suicidal thoughts 0 - -  PHQ-9 Score 0 - -  Difficult doing work/chores Not difficult at all - -    Social History   Tobacco Use  Smoking Status Former   Packs/day: 0.25   Years: 30.00   Pack years: 7.50   Types: Cigarettes   Start date: 07/09/1983   Quit date: 05/27/2014   Years since quitting: 6.9  Smokeless Tobacco Never   -Last DEXA Scan: 7/38/2022  T-Score femoral neck: -1.9  T-Score total hip: -1.7  T-Score lumbar spine: -1.2  T-Score forearm radius: NA  10-year probability of major osteoporotic fracture: 16.5%  10-year probability of hip fracture: 3.4%  BP Readings from Last 3 Encounters:  03/14/21 110/70  03/02/21 126/72  08/14/20 122/72   Pulse Readings from Last 3 Encounters:  03/14/21 74  03/02/21 81  08/14/20 73   Wt Readings from Last 3 Encounters:  03/14/21 226 lb (102.5 kg)  03/02/21 226 lb 3.2 oz (102.6 kg)  08/14/20 220 lb (99.8 kg)   BMI Readings from Last 3 Encounters:  03/14/21 38.79 kg/m  03/02/21 38.83 kg/m  08/14/20 37.76 kg/m    Assessment/Interventions: Review of patient past medical history, allergies, medications, health status, including review of consultants reports, laboratory and other test data, was performed as part of comprehensive evaluation and provision of chronic care management services.   SDOH:  (Social Determinants of Health) assessments and interventions performed: Yes SDOH Interventions    Flowsheet Row Most Recent Value  SDOH Interventions   Financial Strain Interventions Intervention Not Indicated        SDOH Screenings   Alcohol Screen: Not on file  Depression (PHQ2-9): Low Risk    PHQ-2 Score: 0  Financial Resource Strain: Low Risk    Difficulty of Paying Living Expenses: Not hard at all  Food Insecurity: No Food Insecurity   Worried About Charity fundraiser in the Last Year: Never true   Ran Out of Food in the Last Year: Never true  Housing: Low Risk    Last Housing Risk Score: 0  Physical  Activity: Inactive   Days of Exercise per Week: 0 days   Minutes of Exercise per Session: 0 min  Social Connections: Engineer, building services of Communication with Friends and Family: More than three times a week   Frequency of Social Gatherings with Friends and Family: More than three times a week   Attends Religious Services: More than 4 times per year   Active Member of [employer] or Organizations: Yes   Attends Music therapist: More than 4 times per year   Marital Status: Married  Stress: No Stress Concern Present   Feeling of Stress : Not at all  Tobacco Use: Medium Risk   Smoking Tobacco Use: Former   Smokeless Tobacco Use: Never   Passive Exposure: Not on file  Transportation Needs: No Transportation Needs   Lack of Transportation (Medical): No   Lack of Transportation (Non-Medical): No    CCM Care Plan  No Known Allergies  Medications Reviewed Today     Reviewed by Germaine Pomfret, [facility] (Pharmacist) on 03/20/21 at 1129  Med List Status: <None>   Medication Order Taking? Sig Documenting Provider Last Dose Status Informant  aspirin 81 MG chewable tablet [id number]  Chew 81 mg by mouth daily. [provider]  Active   blood glucose meter kit and supplies KIT [id number]  Lifescan meter Check blood sugar 2 times daily as needed Jearld Fenton, NP  Active   calcium carbonate (OS-CAL) 600 MG TABS tablet [id number]  Take 600 mg by mouth daily with breakfast. Jearld Fenton, NP  Active   Cholecalciferol (VITAMIN D) 50 MCG (2000 UT) CAPS [id number]  Take by mouth. Jearld Fenton, NP  Active   ezetimibe (ZETIA) 10 MG tablet [id number]  Take 1 tablet (10 mg total) by mouth daily. Minna Merritts, MD  Active   gabapentin (NEURONTIN) 300 MG capsule [id number] Yes Take 300 mg by mouth 3 (three) times daily. [provider] Taking Active            Med Note Coralee Pesa Aug 14, 2020  2:58 PM)    Lancets (ONETOUCH DELICA PLUS [id number]) [place]  [id number]  USE AS DIRECTED TWICE A DAY AS NEEDED Jearld Fenton, NP  Active   lisinopril (ZESTRIL) 10 MG tablet [id number] Yes Take 1 tablet (10 mg total) by mouth daily. Delsa Grana, PA-C Taking Active   Magnesium Oxide 500 MG CAPS [id number]  Take 1 capsule by mouth daily. [provider]  Active Self  metoprolol tartrate (LOPRESSOR) 25 MG tablet [id number] Yes Take 1 tablet (25 mg total) by mouth 2 (two) times daily. Minna Merritts, MD Taking Active   [facility] ULTRA test strip [id number]  USE AS DIRECTED TWICE A DAY AS NEEDED Delsa Grana, PA-C  Active   Potassium Gluconate 595 MG CAPS [id number]  Take 595 mg by mouth daily. [provider]  Active   rosuvastatin (CRESTOR) 10 MG tablet [id number] Yes Take 1 tablet (10 mg total) by mouth daily. Delsa Grana, PA-C Taking Active   traZODone (DESYREL) 50 MG tablet [id number] Yes Take 1 tablet (50 mg total) by mouth at bedtime. Delsa Grana, PA-C Taking Active   VITAMIN E PO [id number]  Take 800 Units by mouth daily. [provider]  Active   zinc gluconate 50 MG tablet [id number]  Take 50 mg by mouth daily. [provider]  Active             Patient Active Problem List   Diagnosis Date Noted   Peripheral edema 12/28/2019   Neuropathy 11/23/2019   Low serum vitamin B12 09/23/2019   Stage 3a chronic kidney disease ([place]) 09/23/2019   Controlled type 2 diabetes mellitus without complication, without long-term current use of insulin ([place]) 04/01/2019   History of kidney stones 10/14/2018   Insomnia 10/14/2018   Spinal stenosis of lumbar region 08/11/2009   Essential hypertension 01/14/2008   Class 2 severe obesity with serious comorbidity and body mass index (BMI) of 35.0 to 35.9 in adult [facility]) 12/25/2006   HLD (hyperlipidemia) 11/11/2006   Osteopenia 11/11/2006    Immunization History  Administered Date(s) Administered   Fluad Quad(high Dose 65+) 07/03/2020   Influenza,inj,Quad PF,6+ Mos 07/30/2018    Influenza-Unspecified 07/30/2018   PFIZER(Purple Top)SARS-COV-2 Vaccination 03/23/2020, 04/13/2020   Pneumococcal Polysaccharide-23 07/03/2020   Td 12/27/1997, 11/27/2009   Zoster, Live 10/27/2013    Conditions to be addressed/monitored:  Hypertension, Hyperlipidemia, Diabetes, Chronic Kidney Disease, and Osteopenia  Care Plan : General Pharmacy (Adult)  Updates made by Germaine Pomfret, [place] since 05/07/2021 12:00 AM     Problem: Hypertension, Hyperlipidemia, Diabetes, Chronic Kidney Disease, and Osteopenia   Priority: High     Long-Range Goal: Patient-Specific Goal   Start Date: 12/22/2020  Expected End Date: 05/07/2022  This Visit's Progress: On track  Recent Progress: On track  Priority: High  Note:   Current Barriers:  No barriers noted  Pharmacist Clinical Goal(s):  Patient will maintain control of blood pressure as evidenced by BP less than 140/90  through collaboration with PharmD and provider.   Interventions: 1:1 collaboration with Delsa Grana, PA-C regarding development and update of comprehensive plan of care as evidenced by provider attestation and co-signature Inter-disciplinary care team collaboration (see longitudinal plan of care) Comprehensive medication review performed; medication list updated in electronic medical record  Hypertension (BP goal <140/90) -Controlled -Current treatment: Lisinopril 10 mg daily  Metoprolol Tartrate 25 mg twice daily  -Medications previously tried: NA  -Current home readings: Typically 120/70s.  -Denies hypotensive/hypertensive symptoms -Educated on Importance of home blood pressure monitoring; -Counseled to monitor BP at home weekly, document, and provide log at future appointments -Recommended to continue current medication  Hyperlipidemia: (LDL goal < 100) -Controlled -Current treatment: Ezetimibe 10 mg daily  Rosuvastatin 10 mg daily  -Medications previously tried: NA  -[facility] using [facility].   -Educated on Cholesterol goals;  -Recommended to continue current medication  Diabetes (A1c goal <6.5%) -Controlled -Current medications: None -Medications previously tried: NA  -Current home glucose readings fasting glucose: NA -Educated on Carbohydrate counting and/or plate method -Recommended to continue current medication  Osteopenia (Goal Prevent Bone fractures) -Controlled -Patient is a candidate for pharmacologic treatment given 10-year hip fracture risk > 3%.  -Current treatment  Calcium Carbonate 600 mg twice daily  Vitamin D 2000 units daily  -Medications previously tried: NA  -Patient hesitant to start pharmacologic therapy at this time. -Recommended to continue current medication  Patient Goals/Self-Care Activities Patient will:  - focus on medication adherence by utilizing [place] check blood pressure weekly, document, and provide at future appointments  Follow Up Plan: Telephone follow up appointment with care management team member scheduled for:  10/31/2021 at 8:30 AM        Medication Assistance: None required.  Patient affirms current coverage meets needs.  Compliance/Adherence/Medication fill history: Care Gaps: Shingrix, Tdap, Covid Vaccine  Star-Rating Drugs: 09/14/2020 Rosuvastatin 10 mg for a 4 day supply at [facility] 11/21/2020 Lisinopril 10 mg last filled for a 90-Day supply from [facility]  Patient's preferred pharmacy is:  [facility] - [place], [place] - [address] [place] [address] Phone: [phone] Fax: [phone]  Uses pill box? Yes Pt endorses 100% compliance  We discussed: Verbal consent obtained for [facility] enhanced pharmacy services (medication synchronization, adherence packaging, delivery coordination). A medication sync plan was created to allow patient to get all medications delivered once every 30 to 90 days per  patient preference. Patient understands they have freedom to choose pharmacy and clinical pharmacist will coordinate care  between all prescribers and [facility]. Patient decided to: Utilize [facility] for medication synchronization, packaging and delivery  Care Plan and Follow Up Patient Decision:  Patient agrees to Care Plan and Follow-up.  Plan: Telephone follow up appointment with care management team member scheduled for:  10/31/2021 at 8:30 AM  Doristine Section, [facility] [phone]

## 2021-05-07 DIAGNOSIS — E785 Hyperlipidemia, unspecified: Secondary | ICD-10-CM

## 2021-05-07 NOTE — Patient Instructions (Signed)
Visit Information It was great speaking with you today!  Please let me know if you have any questions about our visit.   Goals Addressed             This Visit's Progress    Track and Manage My Blood Pressure-Hypertension       Timeframe:  Long-Range Goal Priority:  High Start Date: 12/21/2020                            Expected End Date: 06/22/2022                      Follow Up within 90 days   - check blood pressure weekly    Why is this important?   You won't feel high blood pressure, but it can still hurt your blood vessels.  High blood pressure can cause heart or kidney problems. It can also cause a stroke.  Making lifestyle changes like losing a little weight or eating less salt will help.  Checking your blood pressure at home and at different times of the day can help to control blood pressure.  If the doctor prescribes medicine remember to take it the way the doctor ordered.  Call the office if you cannot afford the medicine or if there are questions about it.     Notes:         Patient Care Plan: General Pharmacy (Adult)     Problem Identified: Hypertension, Hyperlipidemia, Diabetes, Chronic Kidney Disease, and Osteopenia   Priority: High     Long-Range Goal: Patient-Specific Goal   Start Date: 12/22/2020  Expected End Date: 05/07/2022  This Visit's Progress: On track  Recent Progress: On track  Priority: High  Note:   Current Barriers:  No barriers noted  Pharmacist Clinical Goal(s):  Patient will maintain control of blood pressure as evidenced by BP less than 140/90  through collaboration with PharmD and provider.   Interventions: 1:1 collaboration with Delsa Grana, PA-C regarding development and update of comprehensive plan of care as evidenced by provider attestation and co-signature Inter-disciplinary care team collaboration (see longitudinal plan of care) Comprehensive medication review performed; medication list updated in electronic medical  record  Hypertension (BP goal <140/90) -Controlled -Current treatment: Lisinopril 10 mg daily  Metoprolol Tartrate 25 mg twice daily  -Medications previously tried: NA  -Current home readings: Typically 120/70s.  -Denies hypotensive/hypertensive symptoms -Educated on Importance of home blood pressure monitoring; -Counseled to monitor BP at home weekly, document, and provide log at future appointments -Recommended to continue current medication  Hyperlipidemia: (LDL goal < 100) -Controlled -Current treatment: Ezetimibe 10 mg daily  Rosuvastatin 10 mg daily  -Medications previously tried: NA  -[facility].  -Educated on Cholesterol goals;  -Recommended to continue current medication  Diabetes (A1c goal <6.5%) -Controlled -Current medications: None -Medications previously tried: NA  -Current home glucose readings fasting glucose: NA -Educated on Carbohydrate counting and/or plate method -Recommended to continue current medication  Osteopenia (Goal Prevent Bone fractures) -Controlled -Patient is a candidate for pharmacologic treatment given 10-year hip fracture risk > 3%.  -Current treatment  Calcium Carbonate 600 mg twice daily  Vitamin D 2000 units daily  -Medications previously tried: NA  -Patient hesitant to start pharmacologic therapy at this time. -Recommended to continue current medication  Patient Goals/Self-Care Activities Patient will:  - focus on medication adherence by utilizing [place] check blood pressure weekly,  document, and provide at future appointments  Follow Up Plan: Telephone follow up appointment with care management team member scheduled for:  10/31/2021 at 8:30 AM    Patient agreed to services and verbal consent obtained.   Patient verbalizes understanding of instructions provided today and agrees to view in [place].   Malva Limes, [facility] [phone]

## 2021-05-14 ENCOUNTER — Telehealth: Payer: Self-pay

## 2021-05-14 NOTE — Progress Notes (Signed)
Chronic Care Management Pharmacy Assistant   Name: Jacqueline Jacqueline [name]  MRN: [id number] DOB: 11-13-1950  Reason for Encounter: Medication Review/Medication Coordination Call   Recent office visits:  None ID   Recent consult visits:  None ID   Jacqueline [name] visits:  None in previous 6 months  Medications: Outpatient Encounter Medications as of 05/14/2021  Medication Sig   aspirin 81 MG chewable tablet Chew 81 mg by mouth daily.   blood glucose meter kit and supplies KIT Lifescan meter Check blood sugar 2 times daily as needed   calcium carbonate (OS-CAL) 600 MG TABS tablet Take 600 mg by mouth 2 (two) times daily with a meal.   Cholecalciferol (VITAMIN D) 50 MCG (2000 UT) CAPS Take by mouth.   ezetimibe (ZETIA) 10 MG tablet Take 1 tablet (10 mg total) by mouth daily.   gabapentin (NEURONTIN) 300 MG capsule Take 300 mg by mouth 3 (three) times daily.   Lancets (ONETOUCH DELICA PLUS [id number]) MISC USE AS DIRECTED TWICE A DAY AS NEEDED   lisinopril (ZESTRIL) 10 MG tablet Take 1 tablet (10 mg total) by mouth daily.   Magnesium Oxide 500 MG CAPS Take 1 capsule by mouth daily.   metoprolol tartrate (LOPRESSOR) 25 MG tablet Take 1 tablet (25 mg total) by mouth 2 (two) times daily.   ONETOUCH ULTRA test strip USE AS DIRECTED TWICE A DAY AS NEEDED   Potassium Gluconate 595 MG CAPS Take 595 mg by mouth daily.   rosuvastatin (CRESTOR) 10 MG tablet Take 1 tablet (10 mg total) by mouth daily.   traZODone (DESYREL) 50 MG tablet Take 1 tablet (50 mg total) by mouth at bedtime.   VITAMIN E PO Take 800 Units by mouth daily.   zinc gluconate 50 MG tablet Take 50 mg by mouth daily.   No facility-administered encounter medications on file as of 05/14/2021.   Care Gaps: Zoster Vaccines COVID-19 Vaccine Booster Influenza Vaccine  Star Rating Drugs: Rosuvastatin 10 mg last filled on 04/17/2021 for a  30-Day supply with [facility] Lisinopril 10 mg last filled on 04/17/2021 for a 30-Day  supply with [facility]  BP Readings from Last 3 Encounters:  03/14/21 110/70  03/02/21 126/72  08/14/20 122/72    Lab Results  Component Value Date   HGBA1C 6.1 (H) 03/02/2021     Patient obtains medications through Adherence Packaging  30 Days   Last adherence delivery included:  Lisinopril 10 mg 1 tablet daily with (Breakfast) Metoprolol Tartrate 25 mg 1 tablet twice daily with (Breakfast and Evening meals) Gabapentin 300 mg 1 tablet three times daily at [employer], Lunch, and Evening meals) Trazodone 50 mg 1 tablet at (Bedtime) Rosuvastatin 10 mg 1 tablet daily with (Breakfast )  Patient declined medications last month:  Ezetimibe- patient fills [facility] at [facility] as it is cheaper for her than at [facility].   Patient is due for next adherence delivery on: 05/23/2021 (Wednesday) [address].  Called patient and reviewed medications and coordinated delivery.  This delivery to include: Gabapentin 300 mg 1 tablet three times daily at [employer], Lunch, and Evening meals) Lisinopril 10 mg 1 tablet daily with (Breakfast) Metoprolol Tartrate 25 mg 1 tablet twice daily with (Breakfast and Evening meals) Rosuvastatin 10 mg 1 tablet daily with (Breakfast ) Trazodone 50 mg 1 tablet at (Bedtime)  Patient declined the following medications: Ezetimibe- patient fills this at [facility] as it is cheaper for her than at [facility].   Patient needs refills for Gabapentin 300 mg contacted  Dr. Michaelle Copas, MD office (Neurology) @ [phone], and requested the refill be sent to [facility] representative stated she would send the refill request to the nurse.  Confirmed delivery date of 05/23/2021 (Wednesday) [address], advised patient that pharmacy will contact them the morning of delivery.  Patient has scheduled telephone visit with [name], CPP on 10/28/2021 @0830   Lynann Bologna, CPA/CMA Clinical Pharmacist Assistant Phone: [phone]

## 2021-05-28 ENCOUNTER — Ambulatory Visit (INDEPENDENT_AMBULATORY_CARE_PROVIDER_SITE_OTHER): Payer: Medicare HMO

## 2021-05-28 DIAGNOSIS — E538 Deficiency of other specified B group vitamins: Secondary | ICD-10-CM | POA: Diagnosis not present

## 2021-05-28 MED ORDER — CYANOCOBALAMIN 1000 MCG/ML IJ SOLN
1000.0000 ug | Freq: Once | INTRAMUSCULAR | Status: AC
  Administered 2021-05-28: 1000 ug via INTRAMUSCULAR

## 2021-06-11 ENCOUNTER — Ambulatory Visit: Payer: Medicare HMO | Admitting: Medical

## 2021-06-12 ENCOUNTER — Encounter: Payer: Self-pay | Admitting: Medical

## 2021-06-12 ENCOUNTER — Telehealth: Payer: Self-pay

## 2021-06-12 ENCOUNTER — Other Ambulatory Visit: Payer: Self-pay

## 2021-06-12 ENCOUNTER — Ambulatory Visit: Payer: Medicare HMO | Admitting: Medical

## 2021-06-12 VITALS — BP 120/84 | HR 74 | Ht 64.0 in | Wt 226.2 lb

## 2021-06-12 DIAGNOSIS — I479 Paroxysmal tachycardia, unspecified: Secondary | ICD-10-CM

## 2021-06-12 DIAGNOSIS — I5032 Chronic diastolic (congestive) heart failure: Secondary | ICD-10-CM

## 2021-06-12 DIAGNOSIS — I10 Essential (primary) hypertension: Secondary | ICD-10-CM

## 2021-06-12 DIAGNOSIS — M7989 Other specified soft tissue disorders: Secondary | ICD-10-CM | POA: Diagnosis not present

## 2021-06-12 DIAGNOSIS — R002 Palpitations: Secondary | ICD-10-CM

## 2021-06-12 DIAGNOSIS — E785 Hyperlipidemia, unspecified: Secondary | ICD-10-CM | POA: Diagnosis not present

## 2021-06-12 NOTE — Progress Notes (Signed)
Cardiology Office Note:    Date:  06/12/2021   ID:  Jacqueline [name] [facility], DOB Dec 20, 1950, MRN [id number]  PCP:  Delsa Grana, PA-C  [facility] Cardiologist:  Ida Rogue, MD  [place] Electrophysiologist:  None   Referring MD: Delsa Grana, PA-C   Chief Complaint: 3 month follow-up  History of Present Illness:    Jacqueline [name] is a 70 y.o. female with a hx of HTN, HLD, borderline diabetes, former smoker (quit 2013), paroxysmal tachycardia who is being seen for palpitations.    Heart monitor showed predominately NSR with 5 runs of SVT with the longest interval lasting 6 beats, rare ectopy, triggered events with PVCs.    Last seen 08/2020 for palpitations on metoprolol. She was symptomatically stable.    Phone encounter 01/31/21 patient reported worsening palpitations and apt was made. She was seen 03/14/21 and reported improved palpitations on metoprolol. Echo was ordered, this showed LVEF 60-65%, no WMA, G1DD, trivial pericardial effusion.    Today, the patient reports no changes since the last visit. No changes since the last visit. No palpitations, chest pain, SOB, orthopnea, pnd. Stable LLE. Patient has been walking more, walks every day. BP and HR good today.     Past Medical History:  Diagnosis Date   Calcium oxalate renal stones    Diabetes mellitus without complication (HCC)    Hyperlipidemia    Hypertension    MVP (mitral valve prolapse)     Past Surgical History:  Procedure Laterality Date   BREAST BIOPSY Right 08/19/2012   neg, Dr. Bary Castilla   extraction of renal stones     TONSILLECTOMY AND ADENOIDECTOMY     VAGINAL HYSTERECTOMY  1980   class IV, ovaries intact    Current Medications: Current Meds  Medication Sig   aspirin 81 MG chewable tablet Chew 81 mg by mouth daily.   blood glucose meter kit and supplies KIT Lifescan meter Check blood sugar 2 times daily as needed   calcium carbonate (OS-CAL) 600 MG TABS tablet Take 600 mg by mouth 2  (two) times daily with a meal.   Cholecalciferol (VITAMIN D) 50 MCG (2000 UT) CAPS Take by mouth.   ezetimibe (ZETIA) 10 MG tablet Take 1 tablet (10 mg total) by mouth daily.   gabapentin (NEURONTIN) 300 MG capsule Take 300 mg by mouth 3 (three) times daily.   Lancets (ONETOUCH DELICA PLUS [id number]) MISC USE AS DIRECTED TWICE A DAY AS NEEDED   lisinopril (ZESTRIL) 10 MG tablet Take 1 tablet (10 mg total) by mouth daily.   Magnesium Oxide 500 MG CAPS Take 1 capsule by mouth daily.   metoprolol tartrate (LOPRESSOR) 25 MG tablet Take 1 tablet (25 mg total) by mouth 2 (two) times daily.   ONETOUCH ULTRA test strip USE AS DIRECTED TWICE A DAY AS NEEDED   Potassium Gluconate 595 MG CAPS Take 595 mg by mouth daily.   rosuvastatin (CRESTOR) 10 MG tablet Take 1 tablet (10 mg total) by mouth daily.   traZODone (DESYREL) 50 MG tablet Take 1 tablet (50 mg total) by mouth at bedtime.   VITAMIN E PO Take 800 Units by mouth daily.   zinc gluconate 50 MG tablet Take 50 mg by mouth daily.     Allergies:   Patient has no known allergies.   Social History   Socioeconomic History   Marital status: Married    Spouse name: Richardson Landry   Number of children: 2   Years of education: Not on file  Highest education level: Some college, no degree  Occupational History   Occupation: Naval architect  Tobacco Use   Smoking status: Former    Packs/day: 0.25    Years: 30.00    Pack years: 7.50    Types: Cigarettes    Start date: 07/09/1983    Quit date: 05/27/2014    Years since quitting: 7.0   Smokeless tobacco: Never  Vaping Use   Vaping Use: Never used  Substance and Sexual Activity   Alcohol use: Yes    Alcohol/week: 1.0 standard drink    Types: 1 Glasses of wine per week    Comment: occasional   Drug use: No   Sexual activity: Yes    Partners: Male  Other Topics Concern   Not on file  Social History Narrative   Not on file   Social Determinants of Health   Financial Resource Strain: Low  Risk    Difficulty of Paying Living Expenses: Not hard at all  Food Insecurity: No Food Insecurity   Worried About Charity fundraiser in the Last Year: Never true   [place] in the Last Year: Never true  Transportation Needs: No Transportation Needs   Lack of Transportation (Medical): No   Lack of Transportation (Non-Medical): No  Physical Activity: Inactive   Days of Exercise per Week: 0 days   Minutes of Exercise per Session: 0 min  Stress: No Stress Concern Present   Feeling of Stress : Not at all  Social Connections: Socially Integrated   Frequency of Communication with Friends and Family: More than three times a week   Frequency of Social Gatherings with Friends and Family: More than three times a week   Attends Religious Services: More than 4 times per year   Active Member of [employer] or Organizations: Yes   Attends Music therapist: More than 4 times per year   Marital Status: Married     Family History: The patient's family history includes Cancer in her maternal aunt; Cancer - Other in her father; Dementia in her mother; Heart disease in her maternal grandfather and maternal grandmother; Hypertension in her mother and paternal grandmother; Stroke in her mother and paternal grandfather.  ROS:   Please see the history of present illness.     All other systems reviewed and are negative.  EKGs/Labs/Other Studies Reviewed:    The following studies were reviewed today:  Heart monitor 07/2019 Event Monitor   Normal sinus rhythm Avg HR of 79 bpm.    5 Supraventricular Tachycardia runs occurred, the run with the fastest interval lasting 5 beats with a max rate of 250 bpm, the longest lasting 6 beats with an avg rate of 125 bpm.    Isolated SVEs were rare (<1.0%), SVE Couplets were rare (<1.0%), and SVE Triplets were rare (<1.0%). Isolated VEs were rare (<1.0%), VE Couplets were rare (<1.0%), and no VE Triplets were present. Ventricular Bigeminy and  Trigeminy were present.   Patient triggered events associated with PVCs   Signed, Esmond Plants, MD, Ph.D [facility]    EKG:  EKG is  ordered today.  The ekg ordered today demonstrates NSR, 74bpm, nonspecific T wave changes  Recent Labs: 07/03/2020: Hemoglobin 14.3; Platelets 243 03/02/2021: ALT 16; BUN 15; Creat 0.77; Potassium 4.4; Sodium 141  Recent Lipid Panel    Component Value Date/Time   CHOL 126 04/12/2021 0921   TRIG 90 04/12/2021 0921   HDL 47 04/12/2021 0921   CHOLHDL 2.7 04/12/2021  0921   CHOLHDL 2.5 07/03/2020 1059   VLDL 11.8 10/15/2018 0904   LDLCALC 62 04/12/2021 0921   LDLCALC 55 07/03/2020 1059   LDLDIRECT 149.5 12/25/2006 0851     Physical Exam:    VS:  BP 120/84 (BP Location: Left Arm, Patient Position: Sitting, Cuff Size: Large)   Pulse 74   Ht _0  (1.626 m)   Wt 226 lb 4 oz (102.6 kg)   SpO2 96%   BMI 38.84 kg/m     Wt Readings from Last 3 Encounters:  06/12/21 226 lb 4 oz (102.6 kg)  03/14/21 226 lb (102.5 kg)  03/02/21 226 lb 3.2 oz (102.6 kg)     GEN:  Well nourished, well developed in no acute distress HEENT: Normal NECK: No JVD; No carotid bruits LYMPHATICS: No lymphadenopathy CARDIAC: RRR, no murmurs, rubs, gallops RESPIRATORY:  Clear to auscultation without rales, wheezing or rhonchi  ABDOMEN: Soft, non-tender, non-distended MUSCULOSKELETAL:  No edema; No deformity  SKIN: Warm and dry NEUROLOGIC:  Alert and oriented x 3 PSYCHIATRIC:  Normal affect   ASSESSMENT:    1. Essential hypertension   2. Paroxysmal tachycardia ([place])   3. Swelling of lower leg   4. Palpitations   5. Hyperlipidemia, unspecified hyperlipidemia type   6. Chronic diastolic heart failure (HCC)    PLAN:    In order of problems listed above:  Palpitations H/o paroxysmal tachycardia Patient denies further palpitations. Continue metoprolol  HFpEF Lower leg swelling Echo showed normal LVEF with G1DD. Reports intermittent lower leg edema. No edema  on exam today. Recommend lifestyle changes.  HLD LDL 62. Continue Crestor and Zetia  Borderline diabetes Hgb A1C is 6.1 02/2021. She is walking more. Encouraged diet changes  Disposition: Follow up in 6 month(s) with MD/APP    Signed, Khayman Kirsch Ninfa Meeker, PA-C  06/12/2021 8:27 AM    [facility]

## 2021-06-12 NOTE — Patient Instructions (Signed)
Medication Instructions:   Your physician recommends that you continue on your current medications as directed. Please refer to the Current Medication list given to you today.  *If you need a refill on your cardiac medications before your next appointment, please call your pharmacy*   Lab Work:  None ordered  Testing/Procedures:  None ordered   Follow-Up: At [facility], you and your health needs are our priority.  As part of our continuing mission to provide you with exceptional heart care, we have created designated Provider Care Teams.  These Care Teams include your primary Cardiologist (physician) and Advanced Practice Providers (APPs -  Physician Assistants and Nurse Practitioners) who all work together to provide you with the care you need, when you need it.  We recommend signing up for the patient portal called "MyChart".  Sign up information is provided on this After Visit Summary.  MyChart is used to connect with patients for Virtual Visits (Telemedicine).  Patients are able to view lab/test results, encounter notes, upcoming appointments, etc.  Non-urgent messages can be sent to your provider as well.   To learn more about what you can do with MyChart, go to [email or web address].    Your next appointment:   6 month(s)  The format for your next appointment:   In Person  Provider:   You may see Dr. Rockey Situ or one of the following Advanced Practice Providers on your designated Care Team:   Murray Hodgkins, NP Christell Faith, PA-C Cadence Kathlen Mody, [place]

## 2021-06-12 NOTE — Progress Notes (Signed)
Chronic Care Management Pharmacy Assistant   Name: Yakira Duquette [facility]  MRN: [id number] DOB: 07-Oct-1950  Reason for Encounter: Medication Review/Medication Coordination   Recent office visits:  None ID  Recent consult visits:  06/12/2021 Fabienne Bruns, PA-C (Cardiology) for 3 month Follow-up- No medication changes noted, EKG 12-Lead orders placed, Patient instructed to follow-up in 6 months  Hospital visits:  None in previous 6 months  Medications: Outpatient Encounter Medications as of 06/12/2021  Medication Sig   aspirin 81 MG chewable tablet Chew 81 mg by mouth daily.   blood glucose meter kit and supplies KIT Lifescan meter Check blood sugar 2 times daily as needed   calcium carbonate (OS-CAL) 600 MG TABS tablet Take 600 mg by mouth 2 (two) times daily with a meal.   Cholecalciferol (VITAMIN D) 50 MCG (2000 UT) CAPS Take by mouth.   ezetimibe (ZETIA) 10 MG tablet Take 1 tablet (10 mg total) by mouth daily.   gabapentin (NEURONTIN) 300 MG capsule Take 300 mg by mouth 3 (three) times daily.   Lancets (ONETOUCH DELICA PLUS [id number]) MISC USE AS DIRECTED TWICE A DAY AS NEEDED   lisinopril (ZESTRIL) 10 MG tablet Take 1 tablet (10 mg total) by mouth daily.   Magnesium Oxide 500 MG CAPS Take 1 capsule by mouth daily.   metoprolol tartrate (LOPRESSOR) 25 MG tablet Take 1 tablet (25 mg total) by mouth 2 (two) times daily.   ONETOUCH ULTRA test strip USE AS DIRECTED TWICE A DAY AS NEEDED   Potassium Gluconate 595 MG CAPS Take 595 mg by mouth daily.   rosuvastatin (CRESTOR) 10 MG tablet Take 1 tablet (10 mg total) by mouth daily.   traZODone (DESYREL) 50 MG tablet Take 1 tablet (50 mg total) by mouth at bedtime.   VITAMIN E PO Take 800 Units by mouth daily.   zinc gluconate 50 MG tablet Take 50 mg by mouth daily.   No facility-administered encounter medications on file as of 06/12/2021.   Care Gaps: Zoster Vaccine COVID-19 Booster 3 Influenza Vaccine  Star Rating  Drugs: Rosuvastatin 10 mg last filled on 05/16/2021 for a  30-Day supply with [facility] Lisinopril 10 mg last filled on 05/16/2021 for a 30-Day supply with [facility] Reviewed chart for medication changes ahead of medication coordination call.  BP Readings from Last 3 Encounters:  06/12/21 120/84  03/14/21 110/70  03/02/21 126/72    Lab Results  Component Value Date   HGBA1C 6.1 (H) 03/02/2021    Patient obtains medications through Adherence Packaging  30 Days   Last adherence delivery included:  Gabapentin 300 mg 1 tablet three times daily at [employer], Lunch, and Evening meals) Lisinopril 10 mg 1 tablet daily with (Breakfast) Metoprolol Tartrate 25 mg 1 tablet twice daily with (Breakfast and Evening meals) Rosuvastatin 10 mg 1 tablet daily with (Breakfast ) Trazodone 50 mg 1 tablet at (Bedtime)  Patient declined medications last month: Ezetimibe- patient fills this at [facility] as it is cheaper for her than at [facility]  Patient is due for next adherence delivery on: 06/22/2021 (Friday) [address].  Called patient and reviewed medications and coordinated delivery.  This delivery to include: Gabapentin 300 mg 1 tablet three times daily at [employer], Lunch, and Evening meals) Lisinopril 10 mg 1 tablet daily with (Breakfast) Metoprolol Tartrate 25 mg 1 tablet twice daily with (Breakfast and Evening meals) Trazodone 50 mg 1 tablet at (Bedtime) Rosuvastatin 10 mg 1 tablet daily with (Breakfast )  Patient declined the following  medications: Ezetimibe- patient fills this at [facility] as it is cheaper for her than at [facility]  Patient needs refills for: No refills needed for any medications at this time  Confirmed delivery date of 06/22/2021 [address], advised patient that pharmacy will contact them the morning of delivery.  Patient reports she is doing well. She had a follow-up with Cardio today. She is doing good no current issues. Patient denies any ill symptoms  today.   Patient has a scheduled telephone appointment with [name], CPP on 10/28/2021 @ 0830.  Lynann Bologna, CPA/CMA Clinical Pharmacist Assistant Phone: [phone]

## 2021-06-17 IMAGING — MR MR LUMBAR SPINE W/O CM
5 series · 30 of 48 positions shown · non-contrast
Comparison: None.

CLINICAL DATA: Low back pain with bilateral leg cramps. Fall
April 2019

EXAM:
MRI LUMBAR SPINE WITHOUT CONTRAST
TECHNIQUE: Multiplanar, multisequence MR imaging of the lumbar spine was
performed. No intravenous contrast was administered.

[Series 5: T2 · sagittal · 4.0mm · 0.81mm/px · 6 of 17 slices shown (1 of 2)]
[im 1/17]
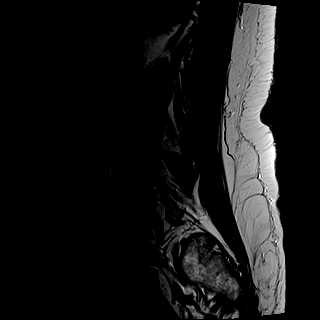
[im 4/17]
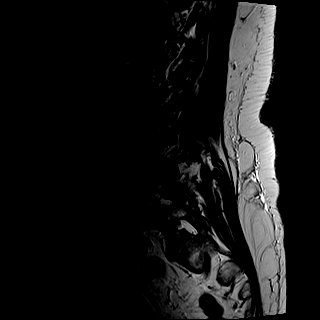
[im 7/17]
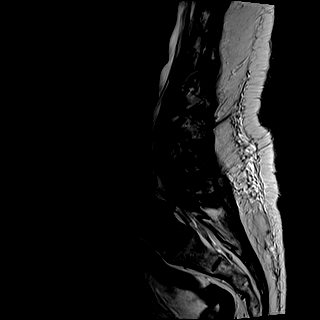
[im 10/17]
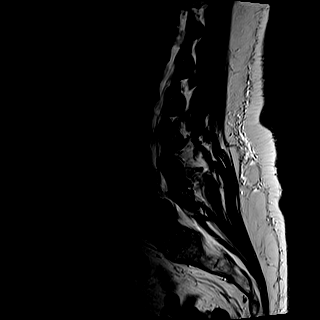
[im 13/17]
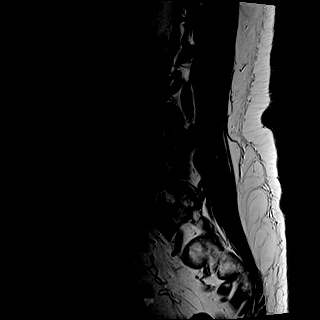
[im 17/17]
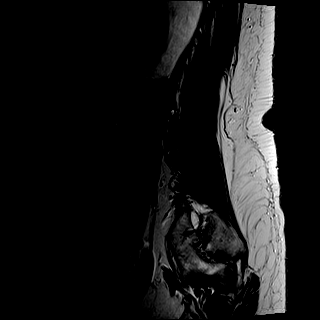

[Series 6: T1 · sagittal · 4.0mm · 0.81mm/px · 7 of 17 slices shown (1 of 2)]
[im 1/17]
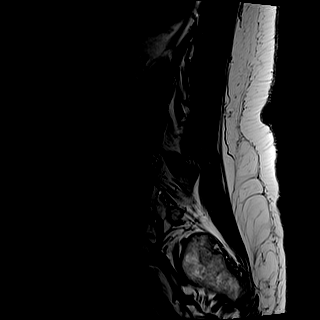
[im 3/17]
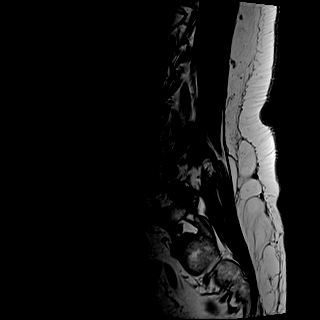
[im 6/17]
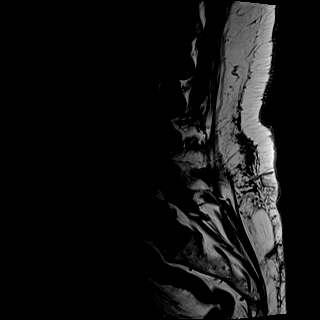
[im 9/17]
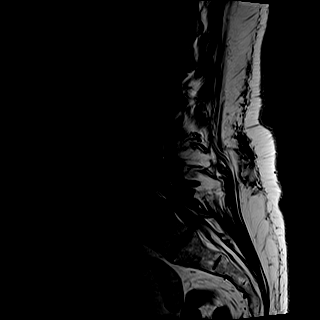
[im 11/17]
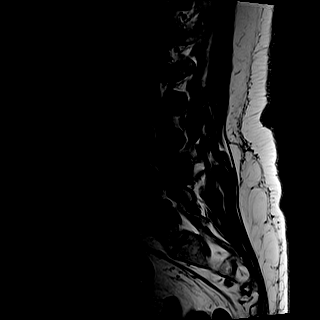
[im 14/17]
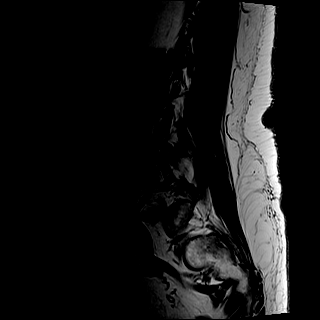
[im 17/17]
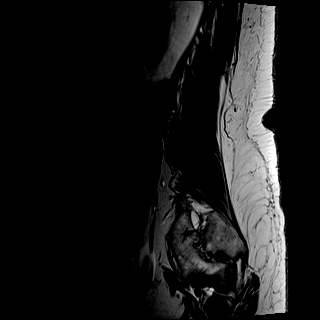

[Series 7: STIR · sagittal · 4.0mm · 0.41mm/px · 1 of 17 slices shown]
[im 1/17]
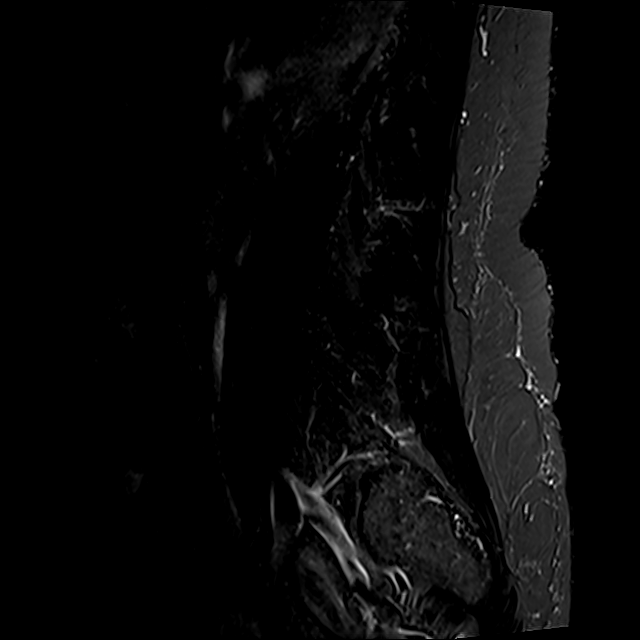

[Series 10: T2 · axial · 4.0mm · 0.66mm/px · z∈[-52,+170]mm · 8 of 36 slices shown (2 of 2)]
[im 1/36]
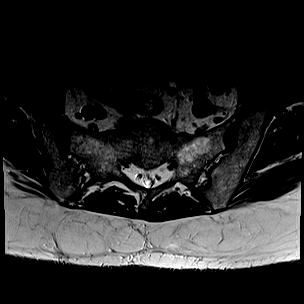
[im 6/36]
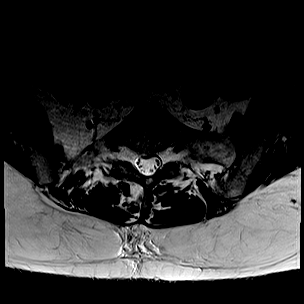
[im 11/36]
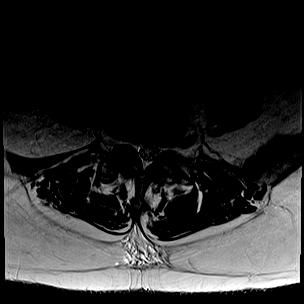
[im 17/36]
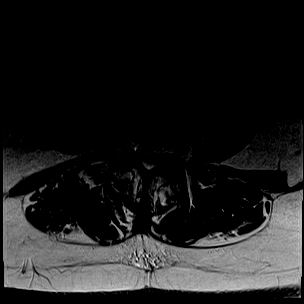
[im 19/36]
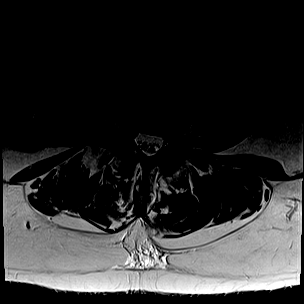
[im 25/36]
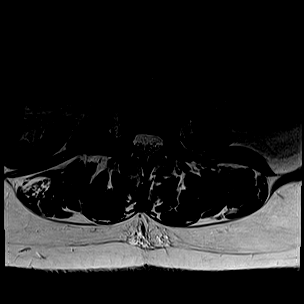
[im 30/36]
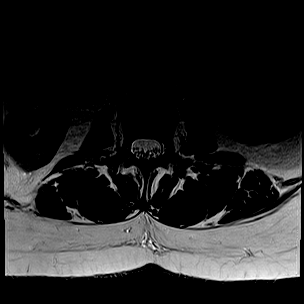
[im 36/36]
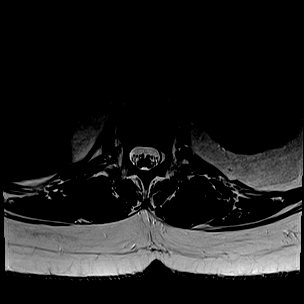

[Series 13: T1 · axial · 4.0mm · 0.39mm/px · z∈[-52,+170]mm · 8 of 36 slices shown (2 of 2)]
[im 1/36]
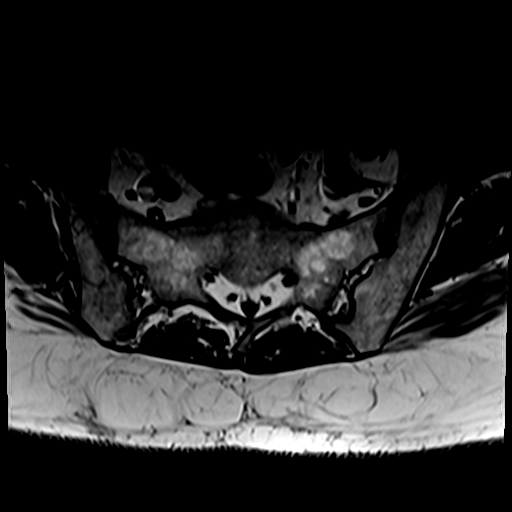
[im 6/36]
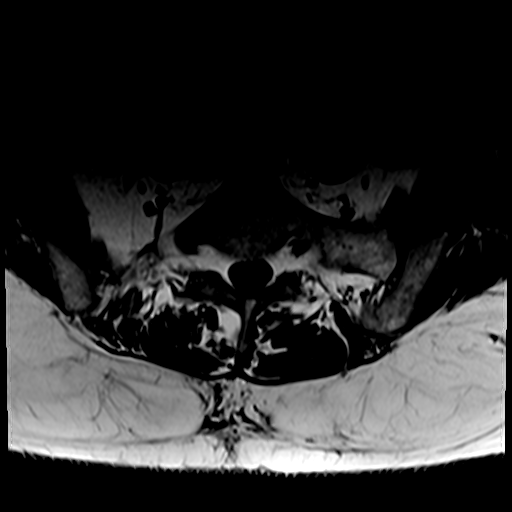
[im 11/36]
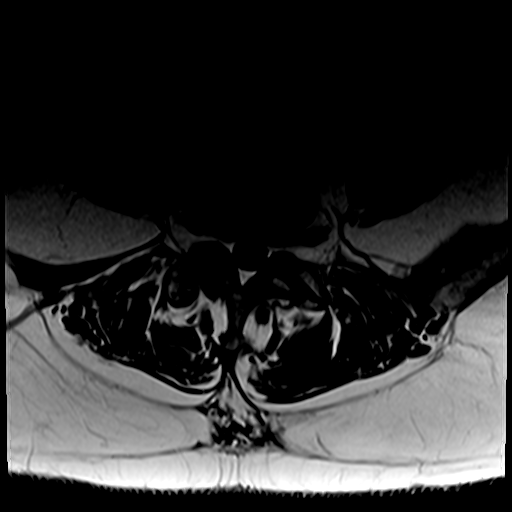
[im 17/36]
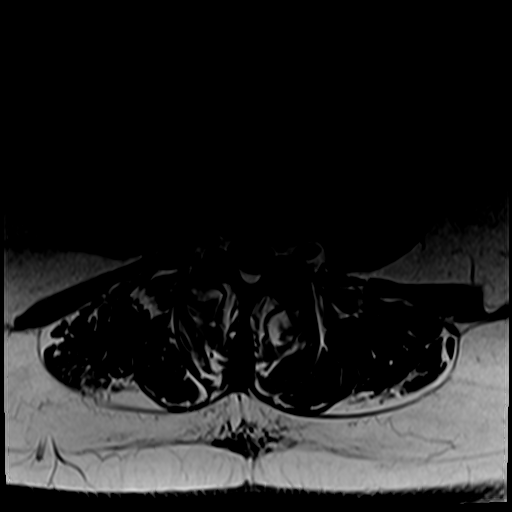
[im 19/36]
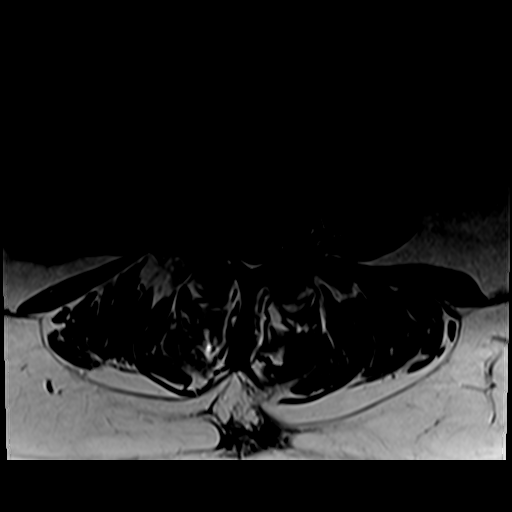
[im 25/36]
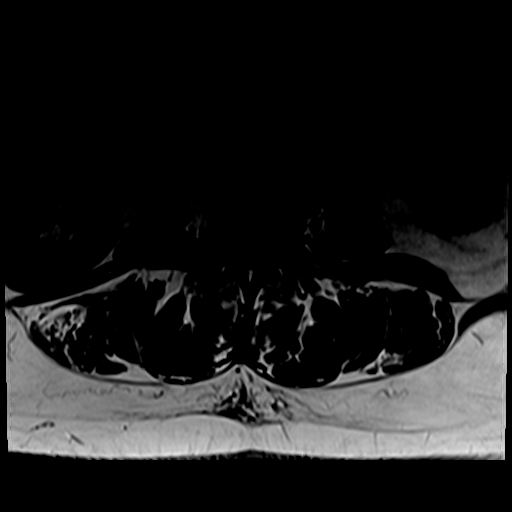
[im 30/36]
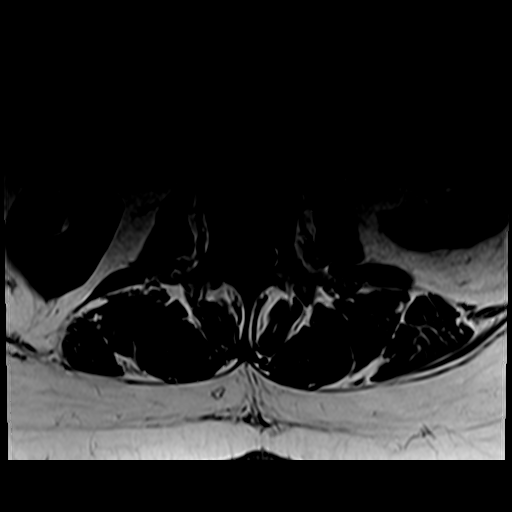
[im 36/36]
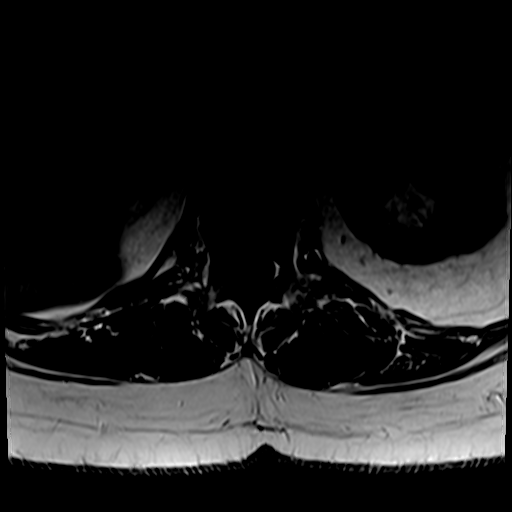

[30 of 48 positions shown; findings below may reference images not displayed]

FINDINGS: Segmentation: Transitional anatomy. There appears to be 12 paired
ribs on June 2019 chest x-ray. Rudimentary disc space is
numbered S1-2.

Alignment: Grade 1 anterolisthesis at the level numbered L5-S1.
slight lumbar dextrocurvature

Vertebrae:  No fracture, evidence of discitis, or bone lesion.

Conus medullaris and cauda equina: Conus extends to the L2 level.
Conus and cauda equina appear normal.

Paraspinal and other soft tissues: Negative

Disc levels:

T12- L1: Unremarkable.

L1-L2: Disc narrowing and bulging.  No impingement

L2-L3: Disc narrowing and bulging.  No impingement

L3-L4: Disc narrowing and biforaminal bulging. No neural impingement

L4-L5: Facet degeneration with asymmetric bulky left-sided spurring.
Mild disc bulging greatest towards the left foramen. No neural
impingement

L5-S1:Advanced facet osteoarthritis with spurring and
anterolisthesis. Greatest level of disc narrowing with bulging and
posterior annular fissure. Spinal stenosis but no neural
compression.

S1-2: Partial segmentation with no significant degenerative changes
or impingement
IMPRESSION: 1. Transitional anatomy with the lowest partially segmented vertebra
numbered S1 based on the lowest ribs.
2. Generalized disc narrowing and bulging. Advanced lower lumbar
facet arthropathy with L4-5 anterolisthesis.
3. L5-S1 spinal stenosis without neural compression. There is
diffuse patency of the foramina.

## 2021-06-19 DIAGNOSIS — J111 Influenza due to unidentified influenza virus with other respiratory manifestations: Secondary | ICD-10-CM | POA: Diagnosis not present

## 2021-06-19 DIAGNOSIS — Z03818 Encounter for observation for suspected exposure to other biological agents ruled out: Secondary | ICD-10-CM | POA: Diagnosis not present

## 2021-06-25 ENCOUNTER — Ambulatory Visit (INDEPENDENT_AMBULATORY_CARE_PROVIDER_SITE_OTHER): Payer: Medicare HMO

## 2021-06-25 DIAGNOSIS — E538 Deficiency of other specified B group vitamins: Secondary | ICD-10-CM | POA: Diagnosis not present

## 2021-06-25 MED ORDER — CYANOCOBALAMIN 1000 MCG/ML IJ SOLN
1000.0000 ug | Freq: Once | INTRAMUSCULAR | Status: AC
  Administered 2021-06-25: 1000 ug via INTRAMUSCULAR

## 2021-07-12 ENCOUNTER — Telehealth: Payer: Self-pay

## 2021-07-12 DIAGNOSIS — E785 Hyperlipidemia, unspecified: Secondary | ICD-10-CM

## 2021-07-12 DIAGNOSIS — G4700 Insomnia, unspecified: Secondary | ICD-10-CM

## 2021-07-12 DIAGNOSIS — I700 Atherosclerosis of aorta: Secondary | ICD-10-CM

## 2021-07-12 DIAGNOSIS — I10 Essential (primary) hypertension: Secondary | ICD-10-CM

## 2021-07-12 NOTE — Progress Notes (Signed)
Chronic Care Management Pharmacy Assistant   Name: Jacqueline [name] [facility]  MRN: [id number] DOB: 11-Jan-1951  Reason for Encounter: Medication Review/Medication Coordination Call  Recent office visits:  06/19/2021 Ronn Melena, MD (Internal Medicine) Generalized Body Aches- Started: Flonase 50 mcg spray into each nostril twice daily, Promethazine-Dextromethorphan 6.25-15 mg Take 5 mLs every 6 hours prn, Tamiflu 75 mg 1 capsule twice daily X 5 days  Recent consult visits:  None ID  Hospital visits:  None in previous 6 months  Medications: Outpatient Encounter Medications as of 07/12/2021  Medication Sig   aspirin 81 MG chewable tablet Chew 81 mg by mouth daily.   blood glucose meter kit and supplies KIT Lifescan meter Check blood sugar 2 times daily as needed   calcium carbonate (OS-CAL) 600 MG TABS tablet Take 600 mg by mouth 2 (two) times daily with a meal.   Cholecalciferol (VITAMIN D) 50 MCG (2000 UT) CAPS Take by mouth.   ezetimibe (ZETIA) 10 MG tablet Take 1 tablet (10 mg total) by mouth daily.   gabapentin (NEURONTIN) 300 MG capsule Take 300 mg by mouth 3 (three) times daily.   Lancets (ONETOUCH DELICA PLUS [id number]) MISC USE AS DIRECTED TWICE A DAY AS NEEDED   lisinopril (ZESTRIL) 10 MG tablet Take 1 tablet (10 mg total) by mouth daily.   Magnesium Oxide 500 MG CAPS Take 1 capsule by mouth daily.   metoprolol tartrate (LOPRESSOR) 25 MG tablet Take 1 tablet (25 mg total) by mouth 2 (two) times daily.   ONETOUCH ULTRA test strip USE AS DIRECTED TWICE A DAY AS NEEDED   Potassium Gluconate 595 MG CAPS Take 595 mg by mouth daily.   rosuvastatin (CRESTOR) 10 MG tablet Take 1 tablet (10 mg total) by mouth daily.   traZODone (DESYREL) 50 MG tablet Take 1 tablet (50 mg total) by mouth at bedtime.   VITAMIN E PO Take 800 Units by mouth daily.   zinc gluconate 50 MG tablet Take 50 mg by mouth daily.   No facility-administered encounter medications on file as of 07/12/2021.   Care  Gaps: Zoster Vaccine COVID-19 Vaccine Booster 3 Influenza Vaccine PNA Vaccine Diabetic Foot Exam A1C last completed 03/02/2021  Star Rating Drugs: Rosuvastatin 10 mg last filled on 06/15/2021 for a 30-Day supply with [facility] Lisinopril 10 mg last filled on 06/15/2021 for a 30-Day supply with [facility]  BP Readings from Last 3 Encounters:  06/12/21 120/84  03/14/21 110/70  03/02/21 126/72    Lab Results  Component Value Date   HGBA1C 6.1 (H) 03/02/2021     Patient obtains medications through Adherence Packaging  30 Days   Last adherence delivery included:  Gabapentin 300 mg 1 tablet three times daily at [employer], Lunch, and Evening meals) Lisinopril 10 mg 1 tablet daily with (Breakfast) Metoprolol Tartrate 25 mg 1 tablet twice daily with (Breakfast and Evening meals) Trazodone 50 mg 1 tablet at (Bedtime) Rosuvastatin 10 mg 1 tablet daily with (Breakfast )  Patient declined the following medications: Ezetimibe- patient fills this at [facility] as it is cheaper for her than at [facility]  Patient is due for next adherence delivery on: 07/24/2021 (Tuesday) [address] [facility] after 3 pm.  Called patient and reviewed medications and coordinated delivery.  This delivery to include: Gabapentin 300 mg 1 tablet three times daily at [employer], Lunch, and Evening meals) Lisinopril 10 mg 1 tablet daily with (Breakfast) Metoprolol Tartrate 25 mg 1 tablet twice daily with (Breakfast and Evening meals) Trazodone 50  mg 1 tablet at (Bedtime) Rosuvastatin 10 mg 1 tablet daily with (Breakfast )  Patient declined the following medications: Ezetimibe- patient fills this at [facility] as it is cheaper for her than at [facility]  Patient needs refills for Lisinopril 10 mg, Trazodone 50 mg, Rosuvastatin 10 mg all are PCP ordered medications CPP can refill.  Confirmed delivery date of 01/172023 [address], advised patient that pharmacy will contact them the morning of delivery.  Patient  reports that she is currently doing well. Patient stated that she did have the flu a few weeks ago, and according to the patient this was her first time ever having the flu. Patient stated that for a few days she was feeling really bad, but she was able to get treated in a timely manner and is doing much better now. Patient has no concerns or issues at this time. She is taking all medications as directed. Patient is encouraged to give [place] a call if she needs anything.   Patient has scheduled telephone appointment with [name], CPP on 10/31/2021 @ [place], [place] Pharmacist Assistant Phone: [phone]

## 2021-07-16 MED ORDER — LISINOPRIL 10 MG PO TABS: 10.0000 mg | ORAL_TABLET | Freq: Every day | ORAL | 1 refills | Status: DC

## 2021-07-16 MED ORDER — ROSUVASTATIN CALCIUM 10 MG PO TABS: 10.0000 mg | ORAL_TABLET | Freq: Every day | ORAL | 1 refills | Status: DC

## 2021-07-16 MED ORDER — TRAZODONE HCL 50 MG PO TABS: 50.0000 mg | ORAL_TABLET | Freq: Every day | ORAL | 1 refills | Status: DC

## 2021-07-16 NOTE — Addendum Note (Signed)
Addended by: Daron Offer A on: 07/16/2021 11:21 AM   Modules accepted: Orders

## 2021-07-30 ENCOUNTER — Ambulatory Visit (INDEPENDENT_AMBULATORY_CARE_PROVIDER_SITE_OTHER): Payer: Medicare HMO

## 2021-07-30 DIAGNOSIS — E538 Deficiency of other specified B group vitamins: Secondary | ICD-10-CM

## 2021-07-30 MED ORDER — CYANOCOBALAMIN 1000 MCG/ML IJ SOLN
1000.0000 ug | Freq: Once | INTRAMUSCULAR | Status: AC
  Administered 2021-07-30: 1000 ug via INTRAMUSCULAR

## 2021-08-07 ENCOUNTER — Ambulatory Visit: Payer: Medicare HMO

## 2021-08-10 ENCOUNTER — Telehealth: Payer: Self-pay

## 2021-08-10 NOTE — Progress Notes (Signed)
Chronic Care Management Pharmacy Assistant   Name: Jacqueline [name] [facility]  MRN: [id number] DOB: 1951-05-12  Reason for Encounter: Medication Review/Medication Coordination   Recent office visits:  None ID  Recent consult visits:  None ID  Hospital visits:  None in previous 6 months  Medications: Outpatient Encounter Medications as of 08/10/2021  Medication Sig   aspirin 81 MG chewable tablet Chew 81 mg by mouth daily.   blood glucose meter kit and supplies KIT Lifescan meter Check blood sugar 2 times daily as needed   calcium carbonate (OS-CAL) 600 MG TABS tablet Take 600 mg by mouth 2 (two) times daily with a meal.   Cholecalciferol (VITAMIN D) 50 MCG (2000 UT) CAPS Take by mouth.   ezetimibe (ZETIA) 10 MG tablet Take 1 tablet (10 mg total) by mouth daily.   gabapentin (NEURONTIN) 300 MG capsule Take 300 mg by mouth 3 (three) times daily.   Lancets (ONETOUCH DELICA PLUS [id number]) MISC USE AS DIRECTED TWICE A DAY AS NEEDED   lisinopril (ZESTRIL) 10 MG tablet Take 1 tablet (10 mg total) by mouth daily.   Magnesium Oxide 500 MG CAPS Take 1 capsule by mouth daily.   metoprolol tartrate (LOPRESSOR) 25 MG tablet Take 1 tablet (25 mg total) by mouth 2 (two) times daily.   ONETOUCH ULTRA test strip USE AS DIRECTED TWICE A DAY AS NEEDED   Potassium Gluconate 595 MG CAPS Take 595 mg by mouth daily.   rosuvastatin (CRESTOR) 10 MG tablet Take 1 tablet (10 mg total) by mouth daily.   traZODone (DESYREL) 50 MG tablet Take 1 tablet (50 mg total) by mouth at bedtime.   VITAMIN E PO Take 800 Units by mouth daily.   zinc gluconate 50 MG tablet Take 50 mg by mouth daily.   No facility-administered encounter medications on file as of 08/10/2021.   Care Gaps: Zoster Vaccine COVID-19 Vaccine Booster 3 Influenza Vaccine PNA Vaccine Diabetic Foot Exam  Star Rating Drugs: Lisinopril 10 mg last filled on 07/08/2021 for a 30-Day supply with [facility]  Rosuvastatin 10 mg last filled on  07/08/2021 for a 30-Day supply with [facility]   BP Readings from Last 3 Encounters:  06/12/21 120/84  03/14/21 110/70  03/02/21 126/72    Lab Results  Component Value Date   HGBA1C 6.1 (H) 03/02/2021     Patient obtains medications through Adherence Packaging  30 Days   Last adherence delivery included:  Gabapentin 300 mg 1 tablet three times daily at [employer], Lunch, and Evening meals) Lisinopril 10 mg 1 tablet daily with (Breakfast) Metoprolol Tartrate 25 mg 1 tablet twice daily with (Breakfast and Evening meals) Trazodone 50 mg 1 tablet at (Bedtime) Rosuvastatin 10 mg 1 tablet daily with (Breakfast )    Patient declined the following medications last month : Ezetimibe- patient fills this at [facility] as it is cheaper for her than at [facility]  Patient is due for next adherence delivery on: 08/22/2021 (Wednesday) [facility] Route.  Called patient and reviewed medications and coordinated delivery.  This delivery to include: Lisinopril 10 mg 1 tablet daily with (Breakfast) Metoprolol Tartrate 25 mg 1 tablet twice daily with (Breakfast and Evening meals) Trazodone 50 mg 1 tablet at (Bedtime) Rosuvastatin 10 mg 1 tablet daily with (Breakfast )  Patient declined the following medications: Ezetimibe- patient fills this at [facility] as it is cheaper for her than at [facility] Gabapentin 300 mg 1 tablet three times daily at [employer], Lunch, and Evening meals)- Patient states  does take this for pain, but if she's not in pain she does not take it so she does have a good amount that she does not need it filled this month. She stated she will probably need it next month. ° °Patient needs refills for. ° °No refills needed for this delivery ° °Confirmed delivery date of 08/22/2021 2nd Route, advised patient that pharmacy will contact them the morning of delivery. ° °Patient reports that she is doing well. She has no ill symptoms at this time. Patient has no issues, or concerns  today. ° °Patient has her AWV scheduled fro 08/14/2021 @ 1040 °Patient is scheduled for a telephone visit with CPP on 10/31/2021 @ 0830 ° °Tosha Thompson, CPA/CMA °Clinical Pharmacist Assistant °Phone: [phone]  ° ° °

## 2021-08-14 ENCOUNTER — Ambulatory Visit (INDEPENDENT_AMBULATORY_CARE_PROVIDER_SITE_OTHER): Payer: Medicare HMO

## 2021-08-14 VITALS — BP 122/80 | HR 76 | Temp 98.8°F | Resp 16 | Ht 64.0 in | Wt 227.0 lb

## 2021-08-14 DIAGNOSIS — Z0000 Encounter for general adult medical examination without abnormal findings: Secondary | ICD-10-CM | POA: Diagnosis not present

## 2021-08-14 DIAGNOSIS — Z6835 Body mass index (BMI) 35.0-35.9, adult: Secondary | ICD-10-CM | POA: Diagnosis not present

## 2021-08-14 DIAGNOSIS — E119 Type 2 diabetes mellitus without complications: Secondary | ICD-10-CM

## 2021-08-14 DIAGNOSIS — I10 Essential (primary) hypertension: Secondary | ICD-10-CM | POA: Diagnosis not present

## 2021-08-14 DIAGNOSIS — E785 Hyperlipidemia, unspecified: Secondary | ICD-10-CM

## 2021-08-14 NOTE — Progress Notes (Signed)
Subjective:   Jacqueline [name] is a 71 y.o. female who presents for Medicare Annual (Subsequent) preventive examination.  Review of Systems     Cardiac Risk Factors include: advanced age (>73mn, >>46women);diabetes mellitus;dyslipidemia;hypertension;obesity (BMI >30kg/m2)     Objective:    Today's Vitals   08/14/21 1054  BP: 122/80  Pulse: 76  Resp: 16  Temp: 98.8 F (37.1 C)  TempSrc: Oral  SpO2: 96%  Weight: 227 lb (103 kg)  Height: 5' 4" (1.626 m)   Body mass index is 38.96 kg/m.  Advanced Directives 08/14/2021 08/03/2020 07/30/2019 06/14/2019  Does Patient Have a Medical Advance Directive? Yes No No No  Type of AParamedicof ASheddLiving will - - -  Copy of HSylvaniain Chart? No - copy requested - - -  Would patient like information on creating a medical advance directive? - Yes (MAU/Ambulatory/Procedural Areas - Information given) Yes (MAU/Ambulatory/Procedural Areas - Information given) -    Current Medications (verified) Outpatient Encounter Medications as of 08/14/2021  Medication Sig   aspirin 81 MG chewable tablet Chew 81 mg by mouth daily.   blood glucose meter kit and supplies KIT Lifescan meter Check blood sugar 2 times daily as needed   calcium carbonate (OS-CAL) 600 MG TABS tablet Take 600 mg by mouth 2 (two) times daily with a meal.   Cholecalciferol (VITAMIN D) 50 MCG (2000 UT) CAPS Take by mouth.   ezetimibe (ZETIA) 10 MG tablet Take 1 tablet (10 mg total) by mouth daily.   gabapentin (NEURONTIN) 300 MG capsule Take 300 mg by mouth 3 (three) times daily.   Lancets (ONETOUCH DELICA PLUS [id number] MISC USE AS DIRECTED TWICE A DAY AS NEEDED   lisinopril (ZESTRIL) 10 MG tablet Take 1 tablet (10 mg total) by mouth daily.   Magnesium Oxide 500 MG CAPS Take 1 capsule by mouth daily.   metoprolol tartrate (LOPRESSOR) 25 MG tablet Take 1 tablet (25 mg total) by mouth 2 (two) times daily.   ONETOUCH ULTRA test strip  USE AS DIRECTED TWICE A DAY AS NEEDED   Potassium Gluconate 595 MG CAPS Take 595 mg by mouth daily.   rosuvastatin (CRESTOR) 10 MG tablet Take 1 tablet (10 mg total) by mouth daily.   traZODone (DESYREL) 50 MG tablet Take 1 tablet (50 mg total) by mouth at bedtime.   VITAMIN E PO Take 800 Units by mouth daily.   zinc gluconate 50 MG tablet Take 50 mg by mouth daily.   No facility-administered encounter medications on file as of 08/14/2021.    Allergies (verified) Patient has no known allergies.   History: Past Medical History:  Diagnosis Date   Calcium oxalate renal stones    Diabetes mellitus without complication (HCC)    Hyperlipidemia    Hypertension    MVP (mitral valve prolapse)    Past Surgical History:  Procedure Laterality Date   BREAST BIOPSY Right 08/19/2012   neg, Dr. BBary Castilla  extraction of renal stones     TONSILLECTOMY AND ADENOIDECTOMY     VAGINAL HYSTERECTOMY  1980   class IV, ovaries intact   Family History  Problem Relation Age of Onset   Hypertension Mother    Dementia Mother    Stroke Mother    Cancer - Other Father        Bile Duct   Heart disease Maternal Grandmother    Heart disease Maternal Grandfather    Hypertension Paternal Grandmother  Cancer Maternal Aunt   °     breast and lung  ° Stroke Paternal Grandfather   ° °Social History  ° °Socioeconomic History  ° Marital status: Married  °  Spouse name: Steve  ° Number of children: 2  ° Years of education: Not on file  ° Highest education level: Some college, no degree  °Occupational History  ° Occupation: Realestate Appraisor  °Tobacco Use  ° Smoking status: Former  °  Packs/day: 0.25  °  Years: 30.00  °  Pack years: 7.50  °  Types: Cigarettes  °  Start date: 07/09/1983  °  Quit date: 05/27/2014  °  Years since quitting: 7.2  ° Smokeless tobacco: Never  °Vaping Use  ° Vaping Use: Never used  °Substance and Sexual Activity  ° Alcohol use: Yes  °  Alcohol/week: 1.0 standard drink  °  Types: 1 Glasses of  wine per week  °  Comment: occasional  ° Drug use: No  ° Sexual activity: Yes  °  Partners: Male  °Other Topics Concern  ° Not on file  °Social History Narrative  ° Not on file  ° °Social Determinants of Health  ° °Financial Resource Strain: Low Risk   ° Difficulty of Paying Living Expenses: Not hard at all  °Food Insecurity: No Food Insecurity  ° Worried About Running Out of Food in the Last Year: Never true  ° Ran Out of Food in the Last Year: Never true  °Transportation Needs: No Transportation Needs  ° Lack of Transportation (Medical): No  ° Lack of Transportation (Non-Medical): No  °Physical Activity: Sufficiently Active  ° Days of Exercise per Week: 7 days  ° Minutes of Exercise per Session: 30 min  °Stress: No Stress Concern Present  ° Feeling of Stress : Not at all  °Social Connections: Socially Integrated  ° Frequency of Communication with Friends and Family: More than three times a week  ° Frequency of Social Gatherings with Friends and Family: More than three times a week  ° Attends Religious Services: More than 4 times per year  ° Active Member of Clubs or Organizations: Yes  ° Attends Club or Organization Meetings: More than 4 times per year  ° Marital Status: Married  ° ° °Tobacco Counseling °Counseling given: Not Answered ° ° °Clinical Intake: ° °Pre-visit preparation completed: Yes ° °Pain : No/denies pain ° °  ° °BMI - recorded: 38.96 °Nutritional Status: BMI > 30  Obese °Nutritional Risks: None °Diabetes: Yes °CBG done?: No °Did pt. bring in CBG monitor from home?: No ° °How often do you need to have someone help you when you read instructions, pamphlets, or other written materials from your doctor or pharmacy?: 1 - Never ° ° ° °Interpreter Needed?: No ° °Information entered by :: Kasey Uthus LPN ° ° °Activities of Daily Living °In your present state of health, do you have any difficulty performing the following activities: 08/14/2021 03/02/2021  °Hearing? N N  °Vision? N N  °Difficulty concentrating  or making decisions? N N  °Walking or climbing stairs? N N  °Dressing or bathing? N N  °Doing errands, shopping? N N  °Preparing Food and eating ? N -  °Using the Toilet? N -  °In the past six months, have you accidently leaked urine? Y -  °Do you have problems with loss of bowel control? N -  °Managing your Medications? N -  °Managing your Finances? N -  °Housekeeping or managing your Housekeeping? N -  °  Some recent data might be hidden  ° ° °Patient Care Team: °Tapia, Leisa, PA-C as PCP - General (Family Medicine) °Fleury, Alexandre A, RPH as Pharmacist (Pharmacist) °Gollan, Timothy J, MD as Consulting Physician (Cardiology) ° °Indicate any recent Medical Services you may have received from other than [facility] providers in the past year (date may be approximate). ° °   °Assessment:  ° This is a routine wellness examination for Jacqueline [name]. ° °Hearing/Vision screen °Hearing Screening - Comments:: Pt denies hearing difficulty °[facility] - Comments:: [facility] ° °Dietary issues and exercise activities discussed: °Current Exercise Habits: Home exercise routine, Type of exercise: walking, Time (Minutes): 30, Frequency (Times/Week): 7, Weekly Exercise (Minutes/Week): 210, Intensity: Mild, Exercise limited by: neurologic condition(s) ° ° Goals Addressed   °None °  ° °Depression Screen °PHQ 2/9 Scores 08/14/2021 03/02/2021 08/03/2020 07/03/2020 06/26/2020 12/28/2019 09/23/2019  °PHQ - 2 Score 0 0 0 0 0 0 0  °PHQ- 9 Score - 0 - - - 0 0  °  °Fall Risk °Fall Risk  08/14/2021 03/02/2021 08/03/2020 07/03/2020 06/26/2020  °Falls in the past year? 0 0 0 0 0  °Number falls in past yr: 0 0 0 0 0  °Comment - - - - -  °Injury with Fall? 0 0 0 0 0  °Comment - - - - -  °Risk for fall due to : No Fall Risks - No Fall Risks - -  °Risk for fall due to: Comment - - - - -  °Follow up Falls prevention discussed - Falls prevention discussed Falls evaluation completed -  ° ° °FALL RISK PREVENTION PERTAINING TO  THE HOME: ° °Any stairs in or around the home? Yes  °If so, are there any without handrails? Yes  - 2 steps from garage °Home free of loose throw rugs in walkways, pet beds, electrical cords, etc? Yes  °Adequate lighting in your home to reduce risk of falls? Yes  ° °ASSISTIVE DEVICES UTILIZED TO PREVENT FALLS: ° °Life alert? No  °Use of a cane, walker or w/c? No  °Grab bars in the bathroom? No  °Shower chair or bench in shower? Yes  °Elevated toilet seat or a handicapped toilet? Yes  ° °TIMED UP AND GO: ° °Was the test performed? Yes .  °Length of time to ambulate 10 feet: 5 sec.  ° °Gait steady and fast without use of assistive device ° °Cognitive Function: °  °  °6CIT Screen 07/30/2019  °What Year? 0 points  °What month? 0 points  °What time? 0 points  °Count back from 20 0 points  °Months in reverse 0 points  °Repeat phrase 0 points  °Total Score 0  ° ° °Immunizations °Immunization History  °Administered Date(s) Administered  ° Fluad Quad(high Dose 65+) 07/03/2020  ° Influenza,inj,Quad PF,6+ Mos 07/30/2018  ° Influenza-Unspecified 07/30/2018  ° PFIZER(Purple Top)SARS-COV-2 Vaccination 03/23/2020, 04/13/2020  ° Pneumococcal Polysaccharide-23 07/03/2020  ° Td 12/27/1997, 11/27/2009  ° Zoster, Live 10/27/2013  ° ° °TDAP status: Due, Education has been provided regarding the importance of this vaccine. Advised may receive this vaccine at local pharmacy or Health Dept. Aware to provide a copy of the vaccination record if obtained from local pharmacy or Health Dept. Verbalized acceptance and understanding. ° °Flu Vaccine status: Due, Education has been provided regarding the importance of this vaccine. Advised may receive this vaccine at local pharmacy or Health Dept. Aware to provide a copy of the vaccination record if obtained from local pharmacy   or Health Dept. Verbalized acceptance and understanding. ° °Pneumococcal vaccine status: Due, Education has been provided regarding the importance of this vaccine. Advised  may receive this vaccine at local pharmacy or Health Dept. Aware to provide a copy of the vaccination record if obtained from local pharmacy or Health Dept. Verbalized acceptance and understanding. ° °Covid-19 vaccine status: Completed vaccines ° °Qualifies for Shingles Vaccine? Yes   °Zostavax completed Yes   °Shingrix Completed?: No.    Education has been provided regarding the importance of this vaccine. Patient has been advised to call insurance company to determine out of pocket expense if they have not yet received this vaccine. Advised may also receive vaccine at local pharmacy or Health Dept. Verbalized acceptance and understanding. ° °Screening Tests °Health Maintenance  °Topic Date Due  ° Zoster Vaccines- Shingrix (1 of 2) Never done  ° COVID-19 Vaccine (3 - Pfizer risk series) 05/11/2020  ° Pneumonia Vaccine 65+ Years old (2 - PCV) 07/03/2021  ° FOOT EXAM  07/03/2021  ° INFLUENZA VACCINE  10/05/2021 (Originally 02/05/2021)  ° HEMOGLOBIN A1C  09/02/2021  ° OPHTHALMOLOGY EXAM  12/18/2021  ° MAMMOGRAM  01/29/2022  ° COLONOSCOPY (Pts 45-49yrs Insurance coverage will need to be confirmed)  08/14/2022  ° DEXA SCAN  02/02/2023  ° Hepatitis C Screening  Completed  ° HPV VACCINES  Aged Out  ° TETANUS/TDAP  Discontinued  ° ° °Health Maintenance ° °Health Maintenance Due  °Topic Date Due  ° Zoster Vaccines- Shingrix (1 of 2) Never done  ° COVID-19 Vaccine (3 - Pfizer risk series) 05/11/2020  ° Pneumonia Vaccine 65+ Years old (2 - PCV) 07/03/2021  ° FOOT EXAM  07/03/2021  ° ° °Colorectal cancer screening: Type of screening: Colonoscopy. Completed 08/14/12. Repeat every 10 years ° °Mammogram status: Completed 01/29/21. Repeat every year ° °Bone Density status: Completed 02/01/21. Results reflect: Bone density results: OSTEOPENIA. Repeat every 2 years. ° °Lung Cancer Screening: (Low Dose CT Chest recommended if Age 55-80 years, 30 pack-year currently smoking OR have quit w/in 15years.) does not qualify.  ° °Additional  Screening: ° °Hepatitis C Screening: does qualify; Completed 04/05/19 ° °Vision Screening: Recommended annual ophthalmology exams for early detection of glaucoma and other disorders of the eye. °Is the patient up to date with their annual eye exam?  Yes  °Who is the provider or what is the name of the office in which the patient attends annual eye exams? [facility] .  ° °Dental Screening: Recommended annual dental exams for proper oral hygiene ° °Community Resource Referral / Chronic Care Management: °CRR required this visit?  No  ° °CCM required this visit?  No  ° ° °  °Plan:  °  ° °I have personally reviewed and noted the following in the patient’s chart:  ° °Medical and social history °Use of alcohol, tobacco or illicit drugs  °Current medications and supplements including opioid prescriptions.  °Functional ability and status °Nutritional status °Physical activity °Advanced directives °List of other physicians °Hospitalizations, surgeries, and ER visits in previous 12 months °Vitals °Screenings to include cognitive, depression, and falls °Referrals and appointments ° °In addition, I have reviewed and discussed with patient certain preventive protocols, quality metrics, and best practice recommendations. A written personalized care plan for preventive services as well as general preventive health recommendations were provided to patient. °  ° ° °Kasey Uthus, LPN   08/14/2021  ° °Nurse Notes: pt states she is uncomfortable with her weight and has modified diet to reduce carbs   and walks daily with her dog. Pt interested in meeting with nutritionist; referral sent for MNT to [place] today. Encouraged moderate physical activity in addition to lifestyle changes. Resources given for diabetes management which is currently well controlled.

## 2021-08-14 NOTE — Patient Instructions (Signed)
[name] , Thank you for taking time to come for your Medicare Wellness Visit. I appreciate your ongoing commitment to your health goals. Please review the following plan we discussed and let me know if I can assist you in the future.   Screening recommendations/referrals: Colonoscopy: done 08/14/12. Repeat 08/2022 Mammogram: done 01/29/21 Bone Density: done 02/01/21 Recommended yearly ophthalmology/optometry visit for glaucoma screening and checkup Recommended yearly dental visit for hygiene and checkup  Vaccinations: Influenza vaccine: declined Pneumococcal vaccine: done 07/03/20; due for Prevnar20 Tdap vaccine: due Shingles vaccine: Shingrix discussed. Please contact your pharmacy for coverage information.  Covid-19:done 03/23/20 & 04/13/20  Advanced directives: Please bring a copy of your health care power of attorney and living will to the office at your convenience.   Conditions/risks identified: Recommend healthy eating and physical activity for desired weight loss  Resources:  Www.diabetes.org  [email or web address].[email or web address].com  Next appointment: Follow up in one year for your annual wellness visit    Preventive Care 65 Years and Older, Female Preventive care refers to lifestyle choices and visits with your health care provider that can promote health and wellness. What does preventive care include? A yearly physical exam. This is also called an annual well check. Dental exams once or twice a year. Routine eye exams. Ask your health care provider how often you should have your eyes checked. Personal lifestyle choices, including: Daily care of your teeth and gums. Regular physical activity. Eating a healthy diet. Avoiding tobacco and drug use. Limiting alcohol use. Practicing safe sex. Taking low-dose aspirin every day. Taking vitamin and mineral supplements as recommended by your health care provider. What happens during an annual well check? The services and screenings done  by your health care provider during your annual well check will depend on your age, overall health, lifestyle risk factors, and family history of disease. Counseling  Your health care provider may ask you questions about your: Alcohol use. Tobacco use. Drug use. Emotional well-being. Home and relationship well-being. Sexual activity. Eating habits. History of falls. Memory and ability to understand (cognition). Work and work Statistician. Reproductive health. Screening  You may have the following tests or measurements: Height, weight, and BMI. Blood pressure. Lipid and cholesterol levels. These may be checked every 5 years, or more frequently if you are over 51 years old. Skin check. Lung cancer screening. You may have this screening every year starting at age 72 if you have a 30-pack-year history of smoking and currently smoke or have quit within the past 15 years. Fecal occult blood test (FOBT) of the stool. You may have this test every year starting at age 24. Flexible sigmoidoscopy or colonoscopy. You may have a sigmoidoscopy every 5 years or a colonoscopy every 10 years starting at age 32. Hepatitis C blood test. Hepatitis B blood test. Sexually transmitted disease (STD) testing. Diabetes screening. This is done by checking your blood sugar (glucose) after you have not eaten for a while (fasting). You may have this done every 1-3 years. Bone density scan. This is done to screen for osteoporosis. You may have this done starting at age 89. Mammogram. This may be done every 1-2 years. Talk to your health care provider about how often you should have regular mammograms. Talk with your health care provider about your test results, treatment options, and if necessary, the need for more tests. Vaccines  Your health care provider may recommend certain vaccines, such as: Influenza vaccine. This is recommended every year. Tetanus, diphtheria, and acellular pertussis (Tdap, Td)  vaccine. You  may need a Td booster every 10 years. Zoster vaccine. You may need this after age 16. Pneumococcal 13-valent conjugate (PCV13) vaccine. One dose is recommended after age 63. Pneumococcal polysaccharide (PPSV23) vaccine. One dose is recommended after age 24. Talk to your health care provider about which screenings and vaccines you need and how often you need them. This information is not intended to replace advice given to you by your health care provider. Make sure you discuss any questions you have with your health care provider. Document Released: 07/21/2015 Document Revised: 03/13/2016 Document Reviewed: 04/25/2015 Elsevier Interactive Patient Education  2017 [place] Prevention in the Home Falls can cause injuries. They can happen to people of all ages. There are many things you can do to make your home safe and to help prevent falls. What can I do on the outside of my home? Regularly fix the edges of walkways and driveways and fix any cracks. Remove anything that might make you trip as you walk through a door, such as a raised step or threshold. Trim any bushes or trees on the path to your home. Use bright outdoor lighting. Clear any walking paths of anything that might make someone trip, such as rocks or tools. Regularly check to see if handrails are loose or broken. Make sure that both sides of any steps have handrails. Any raised decks and porches should have guardrails on the edges. Have any leaves, snow, or ice cleared regularly. Use sand or salt on walking paths during winter. Clean up any spills in your garage right away. This includes oil or grease spills. What can I do in the bathroom? Use night lights. Install grab bars by the toilet and in the tub and shower. Do not use towel bars as grab bars. Use non-skid mats or decals in the tub or shower. If you need to sit down in the shower, use a plastic, non-slip stool. Keep the floor dry. Clean up any water that spills  on the floor as soon as it happens. Remove soap buildup in the tub or shower regularly. Attach bath mats securely with double-sided non-slip rug tape. Do not have throw rugs and other things on the floor that can make you trip. What can I do in the bedroom? Use night lights. Make sure that you have a light by your bed that is easy to reach. Do not use any sheets or blankets that are too big for your bed. They should not hang down onto the floor. Have a firm chair that has side arms. You can use this for support while you get dressed. Do not have throw rugs and other things on the floor that can make you trip. What can I do in the kitchen? Clean up any spills right away. Avoid walking on wet floors. Keep items that you use a lot in easy-to-reach places. If you need to reach something above you, use a strong step stool that has a grab bar. Keep electrical cords out of the way. Do not use floor polish or wax that makes floors slippery. If you must use wax, use non-skid floor wax. Do not have throw rugs and other things on the floor that can make you trip. What can I do with my stairs? Do not leave any items on the stairs. Make sure that there are handrails on both sides of the stairs and use them. Fix handrails that are broken or loose. Make sure that handrails are as long  as the stairways. Check any carpeting to make sure that it is firmly attached to the stairs. Fix any carpet that is loose or worn. Avoid having throw rugs at the top or bottom of the stairs. If you do have throw rugs, attach them to the floor with carpet tape. Make sure that you have a light switch at the top of the stairs and the bottom of the stairs. If you do not have them, ask someone to add them for you. What else can I do to help prevent falls? Wear shoes that: Do not have high heels. Have rubber bottoms. Are comfortable and fit you well. Are closed at the toe. Do not wear sandals. If you use a stepladder: Make  sure that it is fully opened. Do not climb a closed stepladder. Make sure that both sides of the stepladder are locked into place. Ask someone to hold it for you, if possible. Clearly mark and make sure that you can see: Any grab bars or handrails. First and last steps. Where the edge of each step is. Use tools that help you move around (mobility aids) if they are needed. These include: Canes. Walkers. Scooters. Crutches. Turn on the lights when you go into a dark area. Replace any light bulbs as soon as they burn out. Set up your furniture so you have a clear path. Avoid moving your furniture around. If any of your floors are uneven, fix them. If there are any pets around you, be aware of where they are. Review your medicines with your doctor. Some medicines can make you feel dizzy. This can increase your chance of falling. Ask your doctor what other things that you can do to help prevent falls. This information is not intended to replace advice given to you by your health care provider. Make sure you discuss any questions you have with your health care provider. Document Released: 04/20/2009 Document Revised: 11/30/2015 Document Reviewed: 07/29/2014 Elsevier Interactive Patient Education  2017 [employer].

## 2021-08-28 ENCOUNTER — Ambulatory Visit: Payer: Medicare HMO | Admitting: *Deleted

## 2021-09-03 ENCOUNTER — Other Ambulatory Visit: Payer: Self-pay

## 2021-09-03 ENCOUNTER — Encounter: Payer: Medicare HMO | Attending: Family Medicine | Admitting: *Deleted

## 2021-09-03 ENCOUNTER — Ambulatory Visit (INDEPENDENT_AMBULATORY_CARE_PROVIDER_SITE_OTHER): Payer: Medicare HMO

## 2021-09-03 ENCOUNTER — Encounter: Payer: Self-pay | Admitting: *Deleted

## 2021-09-03 VITALS — BP 136/82 | Ht 64.0 in | Wt 229.0 lb

## 2021-09-03 DIAGNOSIS — Z6835 Body mass index (BMI) 35.0-35.9, adult: Secondary | ICD-10-CM | POA: Insufficient documentation

## 2021-09-03 DIAGNOSIS — E538 Deficiency of other specified B group vitamins: Secondary | ICD-10-CM | POA: Diagnosis not present

## 2021-09-03 DIAGNOSIS — E785 Hyperlipidemia, unspecified: Secondary | ICD-10-CM | POA: Diagnosis not present

## 2021-09-03 DIAGNOSIS — I10 Essential (primary) hypertension: Secondary | ICD-10-CM | POA: Insufficient documentation

## 2021-09-03 DIAGNOSIS — Z713 Dietary counseling and surveillance: Secondary | ICD-10-CM | POA: Diagnosis not present

## 2021-09-03 DIAGNOSIS — E119 Type 2 diabetes mellitus without complications: Secondary | ICD-10-CM | POA: Diagnosis not present

## 2021-09-03 MED ORDER — CYANOCOBALAMIN 1000 MCG/ML IJ SOLN
1000.0000 ug | Freq: Once | INTRAMUSCULAR | Status: AC
  Administered 2021-09-03: 1000 ug via INTRAMUSCULAR

## 2021-09-03 NOTE — Patient Instructions (Addendum)
Check blood sugars before breakfast or 2 hrs after supper weekly Bring blood sugar records to the next appointment  Exercise: Continue walking for    15  minutes   5  days a week and add chair exercises   Eat 3 meals day,   1-2  snacks a day Space meals 4-6 hours apart Don't skip meals - eat at least 1 protein and 1 carbohydrate serving Limit desserts/sweets and fruit juice  Complete 3 Day Food Record and bring to next appt  Return for appointment on: Monday October 01, 2021 at 1:30 pm with Freda Munro (nurse)

## 2021-09-03 NOTE — Progress Notes (Signed)
Diabetes Self-Management Education  Visit Type: First/Initial  Appt. Start Time: 1335 Appt. End Time: 9292  09/03/2021  [name], identified by name and date of birth, is a 71 y.o. female with a diagnosis of Diabetes: Type 2.   ASSESSMENT  Blood pressure 136/82, height 5\' 4"  (1.626 m), weight 229 lb (103.9 kg). Body mass index is 39.31 kg/m.   Diabetes Self-Management Education - 09/03/21 1529       Visit Information   Visit Type First/Initial      Initial Visit   Diabetes Type Type 2    Are you currently following a meal plan? No    Are you taking your medications as prescribed? Yes    Date Diagnosed 2 years      Health Coping   How would you rate your overall health? Good      Psychosocial Assessment   Patient Belief/Attitude about Diabetes Other (comment)   "don't pay much attention to it"   Self-care barriers None    Self-management support Doctor's office;Family    Patient Concerns Nutrition/Meal planning;Glycemic Control;Weight Control    Special Needs None    Preferred Learning Style Visual;Hands on    Learning Readiness Ready    How often do you need to have someone help you when you read instructions, pamphlets, or other written materials from your doctor or pharmacy? 1 - Never    What is the last grade level you completed in school? 2 years comm college      Pre-Education Assessment   Patient understands the diabetes disease and treatment process. Needs Instruction    Patient understands incorporating nutritional management into lifestyle. Needs Instruction    Patient undertands incorporating physical activity into lifestyle. Needs Instruction    Patient understands using medications safely. Needs Instruction    Patient understands monitoring blood glucose, interpreting and using results Needs Review    Patient understands prevention, detection, and treatment of acute complications. Needs Instruction    Patient understands prevention, detection, and  treatment of chronic complications. Needs Instruction    Patient understands how to develop strategies to address psychosocial issues. Needs Instruction    Patient understands how to develop strategies to promote health/change behavior. Needs Instruction      Complications   Last HgB A1C per patient/outside source 6.1 %   03/02/2021   How often do you check your blood sugar? 3-4 times / week   every 2 weeks   Fasting Blood glucose range (mg/dL) 70-129   She reports FBG's 110-125 mg/dL   Have you had a dilated eye exam in the past 12 months? Yes    Have you had a dental exam in the past 12 months? No    Are you checking your feet? No      Dietary Intake   Breakfast eggo waffle; yogurt, toast, fruit (banana, blueberries, strawberries); sausage biscuit and grits    Snack (morning) peanut butter crackers, banana, sweets - cookies, ice cream    Lunch left overs, chicken tenders or nuggets with fries from [place] or salad    Snack (afternoon) sweets    Dinner chicken, beef, pork, fish (shrimp), sweet potato, carrots, peas, cron, pinto beans, green beans, squahs, zucchini, rice, pasta    Beverage(s) water, lite juice, coffee and occasional diet soda      Exercise   Exercise Type Light (walking / raking leaves)    How many days per week to you exercise? 5    How many minutes per day  do you exercise? 15    Total minutes per week of exercise 75      Patient Education   Previous Diabetes Education No    Disease state  Definition of diabetes, type 1 and 2, and the diagnosis of diabetes;Factors that contribute to the development of diabetes    Nutrition management  Role of diet in the treatment of diabetes and the relationship between the three main macronutrients and blood glucose level;Food label reading, portion sizes and measuring food.;Reviewed blood glucose goals for pre and post meals and how to evaluate the patients' food intake on their blood glucose level.    Physical activity and  exercise  Role of exercise on diabetes management, blood pressure control and cardiac health.    Monitoring Purpose and frequency of SMBG.;Taught/discussed recording of test results and interpretation of SMBG.;Identified appropriate SMBG and/or A1C goals.    Chronic complications Relationship between chronic complications and blood glucose control    Psychosocial adjustment Identified and addressed patients feelings and concerns about diabetes      Individualized Goals (developed by patient)   Reducing Risk increase portions of olive oil in diet   improve blood sugars, lose weight     Outcomes   Expected Outcomes Demonstrated interest in learning. Expect positive outcomes    Future DMSE 4-6 wks        Individualized Plan for Diabetes Self-Management Training:   Learning Objective:  Patient will have a greater understanding of diabetes self-management. Patient education plan is to attend individual and/or group sessions per assessed needs and concerns.   Plan:   Patient Instructions  Check blood sugars before breakfast or 2 hrs after supper weekly Bring blood sugar records to the next appointment  Exercise: Continue walking for    15  minutes   5  days a week and add chair exercises   Eat 3 meals day,   1-2  snacks a day Space meals 4-6 hours apart Don't skip meals - eat at least 1 protein and 1 carbohydrate serving Limit desserts/sweets and fruit juice  Complete 3 Day Food Record and bring to next appt  Return for appointment on: Monday October 01, 2021 at 1:30 pm with Freda Munro (nurse)  Expected Outcomes:  Demonstrated interest in learning. Expect positive outcomes  Education material provided:  General Meal Planning Guidelines Simple Meal Plan 3 Day Food Record Healthy Snack Choices (ADA) Exercise Handout (ADA)  If problems or questions, patient to contact team via:   Johny Drilling, RN, [place] [phone]  Future DSME appointment: 4-6 wks October 01, 2021

## 2021-09-10 ENCOUNTER — Telehealth: Payer: Self-pay

## 2021-09-10 NOTE — Progress Notes (Signed)
? ? ?Chronic Care Management ?Pharmacy Assistant  ? ?Name: Jacqueline [name] [facility]  MRN: [id number] DOB: [birth year] ? ?Reason for Encounter: Medication Review/Medication Coordination Call for [facility] ?  ?Recent office visits:  ?08/14/2021 Clemetine Marker, LPN (Clinical Support Visit) for Medicare Wellness Exam- No medication changes noted, Referral for Nutrition and DM Services placed, No follow-up noted ? ?Recent consult visits:  ?09/03/2021 Alfredia Client, RN (DM Nutrition Services) No medication changes note, no orders placed, patient to follow-up 10/01/2021 @ 1330 ? ?Hospital visits:  ?None in previous 6 months ? ?Medications: ?Outpatient Encounter Medications as of 09/10/2021  ?Medication Sig Note  ? aspirin 81 MG chewable tablet Chew 81 mg by mouth daily.   ? blood glucose meter kit and supplies KIT Lifescan meter Check blood sugar 2 times daily as needed   ? calcium carbonate (OS-CAL) 600 MG TABS tablet Take 600 mg by mouth 2 (two) times daily with a meal.   ? Cholecalciferol (VITAMIN D) 50 MCG (2000 UT) CAPS Take 1 capsule by mouth daily.   ? ezetimibe (ZETIA) 10 MG tablet Take 1 tablet (10 mg total) by mouth daily.   ? gabapentin (NEURONTIN) 300 MG capsule Take 300 mg by mouth 3 (three) times daily. 08/14/2021: Pt taking once daily   ? Lancets (ONETOUCH DELICA PLUS [id number]) MISC USE AS DIRECTED TWICE A DAY AS NEEDED   ? lisinopril (ZESTRIL) 10 MG tablet Take 1 tablet (10 mg total) by mouth daily.   ? Magnesium Oxide 500 MG CAPS Take 1 capsule by mouth daily.   ? metoprolol tartrate (LOPRESSOR) 25 MG tablet Take 1 tablet (25 mg total) by mouth 2 (two) times daily.   ? ONETOUCH ULTRA test strip USE AS DIRECTED TWICE A DAY AS NEEDED   ? Potassium Gluconate 595 MG CAPS Take 595 mg by mouth daily.   ? rosuvastatin (CRESTOR) 10 MG tablet Take 1 tablet (10 mg total) by mouth daily.   ? traZODone (DESYREL) 50 MG tablet Take 1 tablet (50 mg total) by mouth at bedtime.   ? VITAMIN E PO Take 800 Units by mouth daily.    ? zinc gluconate 50 MG tablet Take 50 mg by mouth daily.   ? ?No facility-administered encounter medications on file as of 09/10/2021.  ? ?Care Gaps: ?Zoster Vaccine ?COVID-19 Vaccine Booster 3 ?Hemoglobin A1C ?PNA Vaccine ?Diabetic Foot Exam ? ?Star Rating Drugs: ?Lisinopril 10 mg last filled on 08/16/2021 for a 30-Day supply with [facility]  ?Rosuvastatin 10 mg last filled on 08/16/2021 for a 30-Day supply with [facility]  ? ?BP Readings from Last 3 Encounters:  ?09/03/21 136/82  ?08/14/21 122/80  ?06/12/21 120/84  ?  ?Lab Results  ?Component Value Date  ? HGBA1C 6.1 (H) 03/02/2021  ?  ?Patient obtains medications through Adherence Packaging  30 Days  ? ?Last adherence delivery included:  ?Lisinopril 10 mg 1 tablet daily with (Breakfast) ?Metoprolol Tartrate 25 mg 1 tablet twice daily with (Breakfast and Evening meals) ?Trazodone 50 mg 1 tablet at (Bedtime) ?Rosuvastatin 10 mg 1 tablet daily with (Breakfast ) ? ?Patient declined medications last month: ?Ezetimibe- patient fills [facility] at [facility] as it is cheaper for her than at [facility] ?Gabapentin 300 mg 1 tablet three times daily at [employer], Lunch, and Evening meals)- Patient states she does take this for pain, but if she's not in pain she does not take it so she does have a good amount that she does not need it filled this month. She stated she  will probably need it next month. ? ?Patient is due for next adherence delivery on: 09/20/2021 (Thursday) [address]. ? ?Called patient and reviewed medications and coordinated delivery. ? ?This delivery to include: ?Lisinopril 10 mg 1 tablet daily with (Breakfast) ?Metoprolol Tartrate 25 mg 1 tablet twice daily with (Breakfast and Evening meals) ?Trazodone 50 mg 1 tablet at (Bedtime) ?Rosuvastatin 10 mg 1 tablet daily with (Breakfast ) ? ?Patient declined the following medications (meds) due to (reason) ?Ezetimibe- patient fills this at [facility] as it is cheaper for her than at [facility] ? ?Patient needs  refills for: ?No Refills needed for this order  ? ?Confirmed delivery date of 09/20/2021 [address], advised patient that pharmacy will contact them the morning of delivery. ? ?Patient has a telephone appointment with [name], CPP on 10/31/2021 _0  ? ?Lynann Bologna, CPA/CMA ?Clinical Pharmacist Assistant ?Phone: [phone]  ? ? ? ?

## 2021-09-14 ENCOUNTER — Other Ambulatory Visit: Payer: Self-pay | Admitting: Family Medicine

## 2021-09-14 NOTE — Telephone Encounter (Signed)
Requested medication (s) are due for refill today: expired medication ? ?Requested medication (s) are on the active medication list: yes ? ?Last refill:  07/15/19 #200 each 1 refill ? ?Future visit scheduled: yes in 11 months ? ?Notes to clinic:  expired medication . Do  you want to renew Rx? ? ? ?  ?Requested Prescriptions  ?Pending Prescriptions Disp Refills  ? ONETOUCH ULTRA test strip [employer] Med Name: ONE TOUCH ULTRA BLUE TEST STRP] 200 strip 1  ?  Sig: USE AS DIRECTED TWICE A DAY AS NEEDED  ?  ? Endocrinology: Diabetes - Testing Supplies Passed - 09/14/2021  8:44 AM  ?  ?  Passed - Valid encounter within last 12 months  ?  Recent Outpatient Visits   ? ?      ? 6 months ago Essential hypertension  ? [facility] Delsa Grana, PA-C  ? 1 year ago Essential hypertension  ? [facility] Delsa Grana, PA-C  ? 1 year ago Acute recurrent maxillary sinusitis  ? [facility] Towanda Malkin, MD  ? 1 year ago Adult general medical exam  ? [facility] Delsa Grana, PA-C  ? 1 year ago Controlled type 2 diabetes mellitus without complication, without long-term current use of insulin ([place])  ? [facility] Delsa Grana, [place]  ? ?  ?  ?Future Appointments   ? ?        ? In 2 weeks [name], MD [place]  ? In 21 months  [place]  ? ?  ? ?  ?  ?  ? ?

## 2021-10-01 ENCOUNTER — Encounter: Payer: Self-pay | Admitting: *Deleted

## 2021-10-01 ENCOUNTER — Ambulatory Visit (INDEPENDENT_AMBULATORY_CARE_PROVIDER_SITE_OTHER): Payer: Medicare HMO | Admitting: Family Medicine

## 2021-10-01 ENCOUNTER — Encounter: Payer: Self-pay | Admitting: Family Medicine

## 2021-10-01 ENCOUNTER — Other Ambulatory Visit: Payer: Self-pay

## 2021-10-01 ENCOUNTER — Ambulatory Visit: Payer: Medicare HMO | Admitting: Family Medicine

## 2021-10-01 ENCOUNTER — Encounter: Payer: Medicare HMO | Attending: Family Medicine | Admitting: *Deleted

## 2021-10-01 VITALS — BP 118/72 | HR 86 | Temp 98.2°F | Resp 16 | Ht 64.0 in | Wt 226.4 lb

## 2021-10-01 VITALS — BP 130/80 | Wt 227.8 lb

## 2021-10-01 DIAGNOSIS — Z111 Encounter for screening for respiratory tuberculosis: Secondary | ICD-10-CM

## 2021-10-01 DIAGNOSIS — E119 Type 2 diabetes mellitus without complications: Secondary | ICD-10-CM | POA: Diagnosis not present

## 2021-10-01 DIAGNOSIS — Z0289 Encounter for other administrative examinations: Secondary | ICD-10-CM | POA: Diagnosis not present

## 2021-10-01 DIAGNOSIS — E538 Deficiency of other specified B group vitamins: Secondary | ICD-10-CM

## 2021-10-01 MED ORDER — CYANOCOBALAMIN 1000 MCG/ML IJ SOLN
1000.0000 ug | Freq: Once | INTRAMUSCULAR | Status: AC
  Administered 2021-10-01: 1000 ug via INTRAMUSCULAR

## 2021-10-01 NOTE — Progress Notes (Signed)
? ?  SUBJECTIVE:  ? ?CHIEF COMPLAINT / HPI:  ? ?Form completion ?- substitute teacher ?- had all childhood immunizations to her knowledge. Did have prior measles and chicken pox as a child.  ?- negative TB screening. ?- has eye doctor she sees regularly ?- denies limitations in vision, hearing, lifting/carrying. ? ?OBJECTIVE:  ? ?Pulse 86   Temp 98.2 ?F (36.8 ?C)   Resp 16   Ht '5\' 4"'$  (1.626 m)   Wt 226 lb 6.4 oz (102.7 kg)   SpO2 98%   BMI 38.86 kg/m?   ?Gen: well appearing, in NAD ?Card: RRR ?Lungs: CTAB ?Ext: WWP, no edema ? ?ASSESSMENT/PLAN:  ? ?Encounter for form completion ?Form completed and scanned to chart. Negative TB screening questionairre, no further need for workup per guidelines. Due for tetanus booster, will obtain at pharmacy. ? ? ?Myles Gip, DO ?

## 2021-10-01 NOTE — Progress Notes (Signed)
Diabetes Self-Management Education ? ?Visit Type: Follow-up ? ?Appt. Start Time: 1320 Appt. End Time: 1751 ? ?10/01/2021 ? ?[name], identified by name and date of birth, is a 71 y.o. female with a diagnosis of Diabetes: Type 2.  ? ?ASSESSMENT ?Blood pressure 130/80 ?Weight 227 lb 12.8 oz (103.3 kg). ?Body mass index is 39.1 kg/m?. ? ? Diabetes Self-Management Education - 10/01/21 1416   ? ?  ? Visit Information  ? Visit Type Follow-up   ?  ? Initial Visit  ? Diabetes Type Type 2   ?  ? Complications  ? How often do you check your blood sugar? 3-4 times / week   weekly  ? Fasting Blood glucose range (mg/dL) 70-129;130-179   FBG's 114,136,116 mg/dL; before lunch 107 mg/dL; before supper 112 mg/dL  ? Postprandial Blood glucose range (mg/dL) 70-129   pp 109-119 mg/dL  ? Have you had a dilated eye exam in the past 12 months? Yes   ? Have you had a dental exam in the past 12 months? No   ? Are you checking your feet? No   ?  ? Dietary Intake  ? Breakfast reports 3 meals/day  and 1 snack - not skipping meals - didn't bring food record   ? Snack (morning) reports less desserts/sweets   ? Beverage(s) drinks lite juice but not daily   ?  ? Exercise  ? Exercise Type Light (walking / raking leaves)   ? How many days per week to you exercise? 5   ? How many minutes per day do you exercise? 15   ? Total minutes per week of exercise 75   ?  ? Patient Education  ? Disease state  Definition of diabetes, type 1 and 2, and the diagnosis of diabetes;Factors that contribute to the development of diabetes   ? Nutrition management  Role of diet in the treatment of diabetes and the relationship between the three main macronutrients and blood glucose level;Food label reading, portion sizes and measuring food.;Carbohydrate counting;Reviewed blood glucose goals for pre and post meals and how to evaluate the patients' food intake on their blood glucose level.;Information on hints to eating out and maintain blood glucose control.   ?  Physical activity and exercise  Role of exercise on diabetes management, blood pressure control and cardiac health.   ? Monitoring Purpose and frequency of SMBG.;Taught/discussed recording of test results and interpretation of SMBG.;Identified appropriate SMBG and/or A1C goals.;Daily foot exams;Yearly dilated eye exam   ? Chronic complications Relationship between chronic complications and blood glucose control   ? Psychosocial adjustment Identified and addressed patients feelings and concerns about diabetes   ?  ? Individualized Goals (developed by patient)  ? Nutrition Follow meal plan discussed   ? Physical Activity Exercise 5-7 days per week;15 minutes per day   ? Monitoring  test my blood glucose as discussed   ? Reducing Risk examine blood glucose patterns   ?  ? Post-Education Assessment  ? Patient understands the diabetes disease and treatment process. Needs Review   ? Patient understands incorporating nutritional management into lifestyle. Needs Review   ? Patient undertands incorporating physical activity into lifestyle. Demonstrates understanding / competency   ? Patient understands using medications safely. Needs Review   ? Patient understands monitoring blood glucose, interpreting and using results Demonstrates understanding / competency   ? Patient understands prevention, detection, and treatment of acute complications. Needs Review   ? Patient understands prevention, detection, and treatment of chronic  complications. Demonstrates understanding / competency   ? Patient understands how to develop strategies to address psychosocial issues. Demonstrates understanding / competency   ? Patient understands how to develop strategies to promote health/change behavior. Demonstrates understanding / competency   ?  ? Outcomes  ? Expected Outcomes Demonstrated interest in learning. Expect positive outcomes   ? Program Status Completed   ?  ? Subsequent Visit  ? Since your last visit have you continued or begun to  take your medications as prescribed? Yes   ? Since your last visit have you had your blood pressure checked? Yes   ? Is your most recent blood pressure lower, unchanged, or higher since your last visit? Higher   ? Since your last visit have you experienced any weight changes? Loss   ? Weight Loss (lbs) 1.2   ? Since your last visit, are you checking your blood glucose at least once a day? N/A   only asked to check weekly  ? ?  ?  ?Individualized Plan for Diabetes Self-Management Training:  ? ?Learning Objective:  Patient will have a greater understanding of diabetes self-management. ?Patient education plan is to attend individual and/or group sessions per assessed needs and concerns. ?  ?Plan:  ? ?Patient Instructions  ?Check blood sugars before breakfast or 2 hrs after supper weekly ? ?Exercise:  Continue walking  for   15  minutes   5  days a week and add chair exercises ? ?Eat 3 meals day,   1-2  snacks a day ?Space meals 4-6 hours apart ?Continue to limit desserts/sweets and fruit juices ? ?Expected Outcomes:  Demonstrated interest in learning. Expect positive outcomes ? ?Education material provided:  ?Planning a Balanced Meal ?Quick and Balanced Meals ? ?If problems or questions, patient to contact team via:   ?Johny Drilling, RN, [place], [place] [phone] ? ?Future DSME appointment:  ?PRN ?

## 2021-10-01 NOTE — Patient Instructions (Signed)
It was great to see you! ? ?Our plans for today:  ?- Get your tetanus booster at the pharmacy.  ? ?Take care and seek immediate care sooner if you develop any concerns.  ? ?Dr. Ky Barban ? ?

## 2021-10-01 NOTE — Patient Instructions (Signed)
Check blood sugars before breakfast or 2 hrs after supper weekly ? ?Exercise:  Continue walking  for   15  minutes   5  days a week and add chair exercises ? ?Eat 3 meals day,   1-2  snacks a day ?Space meals 4-6 hours apart ?Continue to limit desserts/sweets and fruit juices ? ? ?

## 2021-10-02 ENCOUNTER — Ambulatory Visit: Payer: Medicare HMO | Admitting: Dermatology

## 2021-10-02 DIAGNOSIS — L821 Other seborrheic keratosis: Secondary | ICD-10-CM

## 2021-10-02 DIAGNOSIS — Z1283 Encounter for screening for malignant neoplasm of skin: Secondary | ICD-10-CM

## 2021-10-02 DIAGNOSIS — L820 Inflamed seborrheic keratosis: Secondary | ICD-10-CM

## 2021-10-02 DIAGNOSIS — L578 Other skin changes due to chronic exposure to nonionizing radiation: Secondary | ICD-10-CM | POA: Diagnosis not present

## 2021-10-02 DIAGNOSIS — L853 Xerosis cutis: Secondary | ICD-10-CM | POA: Diagnosis not present

## 2021-10-02 DIAGNOSIS — L814 Other melanin hyperpigmentation: Secondary | ICD-10-CM

## 2021-10-02 DIAGNOSIS — D1800 Hemangioma unspecified site: Secondary | ICD-10-CM | POA: Diagnosis not present

## 2021-10-02 DIAGNOSIS — L570 Actinic keratosis: Secondary | ICD-10-CM

## 2021-10-02 NOTE — Patient Instructions (Addendum)
Cryotherapy Aftercare ? ?Wash gently with soap and water everyday.   ?Apply Vaseline and Band-Aid daily until healed.  ? ? ?Seborrheic Keratosis ? ?What causes seborrheic keratoses? ?Seborrheic keratoses are harmless, common skin growths that first appear during adult life.  As time goes by, more growths appear.  Some people may develop a large number of them.  Seborrheic keratoses appear on both covered and uncovered body parts.  They are not caused by sunlight.  The tendency to develop seborrheic keratoses can be inherited.  They vary in color from skin-colored to gray, brown, or even black.  They can be either smooth or have a rough, warty surface.   ?Seborrheic keratoses are superficial and look as if they were stuck on the skin.  Under the microscope this type of keratosis looks like layers upon layers of skin.  That is why at times the top layer may seem to fall off, but the rest of the growth remains and re-grows.   ? ?Treatment ?Seborrheic keratoses do not need to be treated, but can easily be removed in the office.  Seborrheic keratoses often cause symptoms when they rub on clothing or jewelry.  Lesions can be in the way of shaving.  If they become inflamed, they can cause itching, soreness, or burning.  Removal of a seborrheic keratosis can be accomplished by freezing, burning, or surgery. ?If any spot bleeds, scabs, or grows rapidly, please return to have it checked, as these can be an indication of a skin cancer. ? ?Dry Skin Care ? ?What causes dry skin? ? ?Dry skin is common and results from inadequate moisture in the outer skin layers. Dry skin usually results from the excessive loss of moisture from the skin surface. This occurs due to two major factors: ?Normally the skin's oil glands deposit a layer of oil on the skin's surface. This layer of oil prevents the loss of moisture from the skin. Exposure to soaps, cleaners, solvents, and disinfectants removes this oily film, allowing water to  escape. ?Water loss from the skin increases when the humidity is low. During winter months we spend a lot of time indoors where the air is heated. Heated air has very low humidity. This also contributes to dry skin. ? ?A tendency for dry skin may accompany such disorders as eczema. Also, as people age, the number of functioning oil glands decreases, and the tendency toward dry skin can be a sensation of skin tightness when emerging from the shower. ? ?How do I manage dry skin? ? ?Humidify your environment. This can be accomplished by using a humidifier in your bedroom at night during winter months. ?Bathing can actually put moisture back into your skin if done right. Take the following steps while bathing to sooth dry skin: ?Avoid hot water, which only dries the skin and makes itching worse. Use warm water. ?Avoid washcloths or extensive rubbing or scrubbing. ?Use mild soaps like unscented Dove, Oil of Olay, Cetaphil, Basis, or CeraVe. ?If you take baths rather than showers, rinse off soap residue with clean water before getting out of tub. ?Once out of the shower/tub, pat dry gently with a soft towel. Leave your skin damp. ?While still damp, apply any medicated ointment/cream you were prescribed to the affected areas. After you apply your medicated ointment/cream, then apply your moisturizer to your whole body.This is the most important step in dry skin care. If this is omitted, your skin will continue to be dry. ?The choice of moisturizer is also very important.  In general, lotion will not provider enough moisture to severely dry skin because it is water based. You should use an ointment or cream. Moisturizers should also be unscented. Good choices include Vaseline (plain petrolatum), Aquaphor, Cetaphil, CeraVe, Vanicream, DML Forte, Aveeno moisture, or Eucerin Cream. ?Bath oils can be helpful, but do not replace the application of moisturizer after the bath. In addition, they make the tub slippery causing an  increased risk for falls. Therefore, we do not recommend their use. ? ?If You Need Anything After Your Visit ? ?If you have any questions or concerns for your doctor, please call our main line at [phone] and press option 4 to reach your doctor's medical assistant. If no one answers, please leave a voicemail as directed and we will return your call as soon as possible. Messages left after 4 pm will be answered the following business day.  ? ?You may also send [place] a message via MyChart. We typically respond to MyChart messages within 1-2 business days. ? ?For prescription refills, please ask your pharmacy to contact our office. Our fax number is [phone]. ? ?If you have an urgent issue when the clinic is closed that cannot wait until the next business day, you can page your doctor at the number below.   ? ?Please note that while we do our best to be available for urgent issues outside of office hours, we are not available 24/7.  ? ?If you have an urgent issue and are unable to reach [place], you may choose to seek medical care at your doctor's office, retail clinic, urgent care center, or emergency room. ? ?If you have a medical emergency, please immediately call 911 or go to the emergency department. ? ?Pager Numbers ? ?- Dr. Nehemiah Massed: [phone] ? ?- Dr. Laurence Ferrari: [phone] ? ?- Dr. Nicole Kindred: [phone] ? ?In the event of inclement weather, please call our main line at [phone] for an update on the status of any delays or closures. ? ?Dermatology Medication Tips: ?Please keep the boxes that topical medications come in in order to help keep track of the instructions about where and how to use these. Pharmacies typically print the medication instructions only on the boxes and not directly on the medication tubes.  ? ?If your medication is too expensive, please contact our office at [phone] option 4 or send [place] a message through [place].  ? ?We are unable to tell what your co-pay for medications will be in  advance as this is different depending on your insurance coverage. However, we may be able to find a substitute medication at lower cost or fill out paperwork to get insurance to cover a needed medication.  ? ?If a prior authorization is required to get your medication covered by your insurance company, please allow [place] 1-2 business days to complete this process. ? ?Drug prices often vary depending on where the prescription is filled and some pharmacies may offer cheaper prices. ? ?The website www.goodrx.com contains coupons for medications through different pharmacies. The prices here do not account for what the cost may be with help from insurance (it may be cheaper with your insurance), but the website can give you the price if you did not use any insurance.  ?- You can print the associated coupon and take it with your prescription to the pharmacy.  ?- You may also stop by our office during regular business hours and pick up a GoodRx coupon card.  ?- If you need your prescription sent electronically to a  different pharmacy, notify our office through [facility] or by phone at [phone] option 4. ? ? ? ? ?Si Usted Necesita Algo Despu?s de Su Visita ? ?Tambi?n puede enviarnos un mensaje a trav?s de MyChart. Por lo general respondemos a los mensajes de MyChart en el transcurso de 1 a 2 d?as h?biles. ? ?Para renovar recetas, por favor pida a su farmacia que se ponga en contacto con nuestra oficina. Nuestro n?mero de fax es el [phone]. ? ?Si tiene un asunto urgente cuando la cl?nica est? cerrada y que no puede esperar hasta el siguiente d?a h?bil, puede llamar/localizar a su doctor(a) al n?mero que aparece a continuaci?n.  ? ?Por favor, tenga en cuenta que aunque hacemos todo lo posible para estar disponibles para asuntos urgentes fuera del horario de oficina, no estamos disponibles las 24 horas del d?a, los 7 d?as de la semana.  ? ?Si tiene un problema urgente y no puede comunicarse con nosotros, puede  optar por buscar atenci?n m?dica  en el consultorio de su doctor(a), en una cl?nica privada, en un centro de atenci?n urgente o en una sala de emergencias. ? ?Si tiene una emergencia m?dica, por favor llame inmedi

## 2021-10-02 NOTE — Progress Notes (Signed)
? ?Follow-Up Visit ?  ?Subjective  ?Jacqueline [name] [facility] is a 71 y.o. female who presents for the following: Annual Exam. ? ?The patient presents for Total-Body Skin Exam (TBSE) for skin cancer screening and mole check.  The patient has spots, moles and lesions to be evaluated, some may be new or changing. She has new spots on the right posterior ear and right lower back to be checked, no symptoms.  She has a spot on her shoulder that gets irritated by her bra. ?  ?The following portions of the chart were reviewed this encounter and updated as appropriate:  ?  ?  ? ?Review of Systems:  No other skin or systemic complaints except as noted in HPI or Assessment and Plan. ? ?Objective  ?Well appearing patient in no apparent distress; mood and affect are within normal limits. ? ?A full examination was performed including scalp, head, eyes, ears, nose, lips, neck, chest, axillae, abdomen, back, buttocks, bilateral upper extremities, bilateral lower extremities, hands, feet, fingers, toes, fingernails, and toenails. All findings within normal limits unless otherwise noted below. ? ?Left Flank ?4 x 2.5cm brown waxy plaque  ? ?upper sternum x 2, right upper chest x 1 (3) ?Pink scaly macules, upper sternum adjacent to hypopigmented scar. ? ?Right Anterior Shoulder ?Erythematous stuck-on, waxy papule ? ? ? ?Assessment & Plan  ?Skin cancer screening performed today. ? ?Actinic Damage ?- chronic, secondary to cumulative UV radiation exposure/sun exposure over time ?- diffuse scaly erythematous macules with underlying dyspigmentation ?- Recommend daily broad spectrum sunscreen SPF 30+ to sun-exposed areas, reapply every 2 hours as needed.  ?- Recommend staying in the shade or wearing long sleeves, sun glasses (UVA+UVB protection) and wide brim hats (4-inch brim around the entire circumference of the hat). ?- Call for new or changing lesions. ? ?Seborrheic Keratoses ?- Stuck-on, waxy, tan-brown papules and/or plaques, including  right post ear and right lower back ?- Benign-appearing ?- Discussed benign etiology and prognosis. ?- Observe ?- Call for any changes ? ?Lentigines ?- Scattered tan macules ?- Due to sun exposure ?- Benign-appering, observe ?- Recommend daily broad spectrum sunscreen SPF 30+ to sun-exposed areas, reapply every 2 hours as needed. ?- Call for any changes ? ?Hemangiomas ?- Red papules ?- Discussed benign nature ?- Observe ?- Call for any changes ? ?Seborrheic keratosis ?Left Flank ? ?vs Epidermal Nevus - Stable from previous visit and photo ? ?Benign, observe. Present since childhood per patient. ?   ? ?AK (actinic keratosis) (3) ?upper sternum x 2, right upper chest x 1 ? ?Actinic keratoses are precancerous spots that appear secondary to cumulative UV radiation exposure/sun exposure over time. They are chronic with expected duration over 1 year. A portion of actinic keratoses will progress to squamous cell carcinoma of the skin. It is not possible to reliably predict which spots will progress to skin cancer and so treatment is recommended to prevent development of skin cancer. ? ?Recommend daily broad spectrum sunscreen SPF 30+ to sun-exposed areas, reapply every 2 hours as needed.  ?Recommend staying in the shade or wearing long sleeves, sun glasses (UVA+UVB protection) and wide brim hats (4-inch brim around the entire circumference of the hat). ?Call for new or changing lesions. ? ?Destruction of lesion - upper sternum x 2, right upper chest x 1 ? ?Destruction method: cryotherapy   ?Informed consent: discussed and consent obtained   ?Lesion destroyed using liquid nitrogen: Yes   ?Region frozen until ice ball extended beyond lesion: Yes   ?Outcome:  patient tolerated procedure well with no complications   ?Post-procedure details: wound care instructions given   ?Additional details:  Prior to procedure, discussed risks of blister formation, small wound, skin dyspigmentation, or rare scar following cryotherapy.  Recommend Vaseline ointment to treated areas while healing. ? ? ?Inflamed seborrheic keratosis ?Right Anterior Shoulder ? ?Destruction of lesion - Right Anterior Shoulder ? ?Destruction method: cryotherapy   ?Informed consent: discussed and consent obtained   ?Lesion destroyed using liquid nitrogen: Yes   ?Region frozen until ice ball extended beyond lesion: Yes   ?Outcome: patient tolerated procedure well with no complications   ?Post-procedure details: wound care instructions given   ?Additional details:  Prior to procedure, discussed risks of blister formation, small wound, skin dyspigmentation, or rare scar following cryotherapy. Recommend Vaseline ointment to treated areas while healing. ? ? ?Xerosis ?- diffuse xerotic patches ?- recommend gentle, hydrating skin care ?- gentle skin care handout given ? ?Return in about 1 year (around 10/03/2022) for TBSE, Hx AKs. ? ?I, Jacqueline [name], CMA, am acting as scribe for Jacqueline Patty, MD . ? ?Documentation: I have reviewed the above documentation for accuracy and completeness, and I agree with the above. ? ?Jacqueline Patty MD  ? ?

## 2021-10-08 ENCOUNTER — Ambulatory Visit (INDEPENDENT_AMBULATORY_CARE_PROVIDER_SITE_OTHER): Payer: Medicare HMO | Admitting: Family Medicine

## 2021-10-08 ENCOUNTER — Encounter: Payer: Self-pay | Admitting: Family Medicine

## 2021-10-08 VITALS — BP 118/74 | HR 75 | Temp 98.0°F | Resp 16 | Ht 64.0 in | Wt 227.4 lb

## 2021-10-08 DIAGNOSIS — R7303 Prediabetes: Secondary | ICD-10-CM

## 2021-10-08 DIAGNOSIS — E785 Hyperlipidemia, unspecified: Secondary | ICD-10-CM

## 2021-10-08 DIAGNOSIS — I10 Essential (primary) hypertension: Secondary | ICD-10-CM

## 2021-10-08 DIAGNOSIS — G629 Polyneuropathy, unspecified: Secondary | ICD-10-CM | POA: Diagnosis not present

## 2021-10-08 DIAGNOSIS — Z5181 Encounter for therapeutic drug level monitoring: Secondary | ICD-10-CM

## 2021-10-08 DIAGNOSIS — I479 Paroxysmal tachycardia, unspecified: Secondary | ICD-10-CM | POA: Diagnosis not present

## 2021-10-08 DIAGNOSIS — M8589 Other specified disorders of bone density and structure, multiple sites: Secondary | ICD-10-CM

## 2021-10-08 DIAGNOSIS — E559 Vitamin D deficiency, unspecified: Secondary | ICD-10-CM | POA: Diagnosis not present

## 2021-10-08 DIAGNOSIS — E538 Deficiency of other specified B group vitamins: Secondary | ICD-10-CM

## 2021-10-08 DIAGNOSIS — E669 Obesity, unspecified: Secondary | ICD-10-CM

## 2021-10-08 DIAGNOSIS — G4700 Insomnia, unspecified: Secondary | ICD-10-CM | POA: Diagnosis not present

## 2021-10-08 DIAGNOSIS — Z23 Encounter for immunization: Secondary | ICD-10-CM | POA: Diagnosis not present

## 2021-10-08 DIAGNOSIS — Z6839 Body mass index (BMI) 39.0-39.9, adult: Secondary | ICD-10-CM

## 2021-10-08 DIAGNOSIS — I700 Atherosclerosis of aorta: Secondary | ICD-10-CM | POA: Diagnosis not present

## 2021-10-08 DIAGNOSIS — I5032 Chronic diastolic (congestive) heart failure: Secondary | ICD-10-CM | POA: Diagnosis not present

## 2021-10-08 DIAGNOSIS — E119 Type 2 diabetes mellitus without complications: Secondary | ICD-10-CM

## 2021-10-08 MED ORDER — SHINGRIX 50 MCG/0.5ML IM SUSR: 0.5000 mL | Freq: Once | INTRAMUSCULAR | 1 refills | Status: AC

## 2021-10-08 NOTE — Progress Notes (Signed)
? ?Name: Jacqueline [name]   MRN: [id number]    DOB: 10-05-1950   Date:10/08/2021 ? ?     Progress Note ? ?Chief Complaint  ?Patient presents with  ? Follow-up  ? Hypertension  ? Hyperlipidemia  ? Diabetes  ? Insomnia  ? ? ? ?Subjective:  ? ?Jacqueline [name] [facility] is a 71 y.o. female, presents to clinic for routine f/up ? ?Hypertension:  ?Currently managed on lisinopril and palpitations managed with metoprolol  - all well controlled ?Pt reports good med compliance and denies any SE.   ?BP Readings from Last 3 Encounters:  ?10/01/21 130/80  ?10/01/21 118/72  ?09/03/21 136/82  ? ?Pt denies CP, SOB, exertional sx, LE edema, palpitation, Ha's, visual disturbances, lightheadedness, hypotension, syncope. ?  ? ?DM/ not on meds - labs for the past several years in prediabetic change ?Denies: Polyuria, polydipsia, vision changes, neuropathy, hypoglycemia ?Recent pertinent labs: ?Lab Results  ?Component Value Date  ? HGBA1C 6.1 (H) 03/02/2021  ? HGBA1C 6.2 (H) 07/03/2020  ? HGBA1C 5.7 (H) 09/23/2019  ? ?Lab Results  ?Component Value Date  ? MICROALBUR <0.2 12/28/2019  ? [place] 62 04/12/2021  ? CREATININE 0.77 03/02/2021  ? ?Standard of care and health maintenance: ?Urine Microalbumin:  n/a due to lisinopril ?Foot exam:  done ?ACEI/ARB:  yes ?Statin:  yes ? ?Hyperlipidemia: ?Currently treated with on zetia and crestor 10, pt reports good med compliance ?Last Lipids: ?Lab Results  ?Component Value Date  ? CHOL 126 04/12/2021  ? HDL 47 04/12/2021  ? [place] 62 04/12/2021  ? LDLDIRECT 149.5 12/25/2006  ? TRIG 90 04/12/2021  ? CHOLHDL 2.7 04/12/2021  ? ?- Denies: Chest pain, shortness of breath, myalgias, claudication ? ?Insomnia - on trazodone 50 mg at bedtime ? ?Neuropathy on gabapentin - sx well controlled ? ?Last vitamin D ?Lab Results  ?Component Value Date  ? VD25OH 47 03/02/2021  ? ?Lab Results  ?Component Value Date  ? [id number] 303 03/02/2021  ?Still doing B12 shots monthly ? ? ? ? ? ?Current Outpatient Medications:  ?   aspirin 81 MG chewable tablet, Chew 81 mg by mouth daily., Disp: , Rfl:  ?  blood glucose meter kit and supplies KIT, Lifescan meter Check blood sugar 2 times daily as needed, Disp: 1 each, Rfl: 0 ?  calcium carbonate (OS-CAL) 600 MG TABS tablet, Take 600 mg by mouth 2 (two) times daily with a meal., Disp: 30 tablet, Rfl: 0 ?  Cholecalciferol (VITAMIN D) 50 MCG (2000 UT) CAPS, Take 1 capsule by mouth daily., Disp: 30 capsule, Rfl:  ?  ezetimibe (ZETIA) 10 MG tablet, Take 1 tablet (10 mg total) by mouth daily., Disp: 90 tablet, Rfl: 2 ?  gabapentin (NEURONTIN) 300 MG capsule, Take 300 mg by mouth 3 (three) times daily., Disp: , Rfl:  ?  Lancets (ONETOUCH DELICA PLUS [id number]) MISC, USE AS DIRECTED TWICE A DAY AS NEEDED, Disp: 200 each, Rfl: 0 ?  lisinopril (ZESTRIL) 10 MG tablet, Take 1 tablet (10 mg total) by mouth daily., Disp: 90 tablet, Rfl: 1 ?  Magnesium Oxide 500 MG CAPS, Take 1 capsule by mouth daily., Disp: , Rfl:  ?  metoprolol tartrate (LOPRESSOR) 25 MG tablet, Take 1 tablet (25 mg total) by mouth 2 (two) times daily., Disp: 180 tablet, Rfl: 2 ?  ONETOUCH ULTRA test strip, USE AS DIRECTED TWICE A DAY AS NEEDED, Disp: 200 strip, Rfl: 1 ?  Potassium Gluconate 595 MG CAPS, Take 595 mg by mouth daily., Disp: , Rfl:  ?  rosuvastatin (CRESTOR) 10 MG tablet, Take 1 tablet (10 mg total) by mouth daily., Disp: 90 tablet, Rfl: 1 ?  traZODone (DESYREL) 50 MG tablet, Take 1 tablet (50 mg total) by mouth at bedtime., Disp: 90 tablet, Rfl: 1 ?  VITAMIN E PO, Take 800 Units by mouth daily., Disp: , Rfl:  ?  zinc gluconate 50 MG tablet, Take 50 mg by mouth daily., Disp: , Rfl:  ? ?Patient Active Problem List  ? Diagnosis Date Noted  ? Peripheral edema 12/28/2019  ? Neuropathy 11/23/2019  ? Low serum vitamin B12 09/23/2019  ? Stage 3a chronic kidney disease ([place]) 09/23/2019  ? Controlled type 2 diabetes mellitus without complication, without long-term current use of insulin ([place]) 04/01/2019  ? History of kidney stones  10/14/2018  ? Insomnia 10/14/2018  ? Spinal stenosis of lumbar region 08/11/2009  ? Essential hypertension 01/14/2008  ? Class 2 severe obesity with serious comorbidity and body mass index (BMI) of 35.0 to 35.9 in adult [facility]) 12/25/2006  ? HLD (hyperlipidemia) 11/11/2006  ? Osteopenia 11/11/2006  ? ? ?Past Surgical History:  ?Procedure Laterality Date  ? BREAST BIOPSY Right 08/19/2012  ? neg, Dr. Bary Castilla  ? extraction of renal stones    ? TONSILLECTOMY AND ADENOIDECTOMY    ? VAGINAL HYSTERECTOMY  1980  ? class IV, ovaries intact  ? ? ?Family History  ?Problem Relation Age of Onset  ? Hypertension Mother   ? Dementia Mother   ? Stroke Mother   ? Cancer - Other Father   ?     Bile Duct  ? Heart disease Maternal Grandmother   ? Heart disease Maternal Grandfather   ? Hypertension Paternal Grandmother   ? Cancer Maternal Aunt   ?     breast and lung  ? Stroke Paternal Grandfather   ? ? ?Social History  ? ?Tobacco Use  ? Smoking status: Former  ?  Packs/day: 0.25  ?  Years: 30.00  ?  Pack years: 7.50  ?  Types: Cigarettes  ?  Start date: 07/09/1983  ?  Quit date: 05/27/2014  ?  Years since quitting: 7.3  ? Smokeless tobacco: Never  ?Vaping Use  ? Vaping Use: Never used  ?Substance Use Topics  ? Alcohol use: Yes  ?  Alcohol/week: 4.0 standard drinks  ?  Types: 4 Glasses of wine per week  ?  Comment: occasional  ? Drug use: No  ?  ? ?No Known Allergies ? ?Health Maintenance  ?Topic Date Due  ? Zoster Vaccines- Shingrix (1 of 2) Never done  ? COVID-19 Vaccine (3 - Pfizer risk series) 05/11/2020  ? Pneumonia Vaccine 4+ Years old (2 - PCV) 07/03/2021  ? FOOT EXAM  07/03/2021  ? HEMOGLOBIN A1C  09/02/2021  ? OPHTHALMOLOGY EXAM  12/18/2021  ? MAMMOGRAM  01/29/2022  ? INFLUENZA VACCINE  02/05/2022  ? COLONOSCOPY (Pts 45-43yr Insurance coverage will need to be confirmed)  08/14/2022  ? DEXA SCAN  02/02/2023  ? Hepatitis C Screening  Completed  ? HPV VACCINES  Aged Out  ? TETANUS/TDAP  Discontinued  ? ? ?Chart Review Today: ?I  personally reviewed active problem list, medication list, allergies, family history, social history, health maintenance, notes from last encounter, lab results, imaging with the patient/caregiver today. ? ? ?Review of Systems  ?Constitutional: Negative.   ?HENT: Negative.    ?Eyes: Negative.   ?Respiratory: Negative.    ?Cardiovascular: Negative.   ?Gastrointestinal: Negative.   ?Endocrine: Negative.   ?Genitourinary: Negative.   ?  Musculoskeletal: Negative.   ?Skin: Negative.   ?Allergic/Immunologic: Negative.   ?Neurological: Negative.   ?Hematological: Negative.   ?Psychiatric/Behavioral: Negative.    ?All other systems reviewed and are negative.  ? ?Objective:  ? ?Vitals:  ? 10/08/21 1119  ?BP: 118/74  ?Pulse: 75  ?Resp: 16  ?Temp: 98 ?F (36.7 ?C)  ?TempSrc: Oral  ?SpO2: 95%  ?Weight: 227 lb 6.4 oz (103.1 kg)  ?Height: 5' 4"  (1.626 m)  ? ? ?Body mass index is 39.03 kg/m?. ? ?Physical Exam ?Vitals and nursing note reviewed.  ?Constitutional:   ?   General: She is not in acute distress. ?   Appearance: Normal appearance. She is well-developed. She is obese. She is not ill-appearing, toxic-appearing or diaphoretic.  ?HENT:  ?   Head: Normocephalic and atraumatic.  ?   Right Ear: External ear normal.  ?   Left Ear: External ear normal.  ?   Nose: Nose normal.  ?   Mouth/Throat:  ?   Pharynx: Uvula midline.  ?Eyes:  ?   General: Lids are normal. No scleral icterus.    ?   Right eye: No discharge.     ?   Left eye: No discharge.  ?   Conjunctiva/sclera: Conjunctivae normal.  ?Neck:  ?   Trachea: Phonation normal. No tracheal deviation.  ?Cardiovascular:  ?   Rate and Rhythm: Normal rate and regular rhythm.  ?   Pulses: Normal pulses.     ?     Radial pulses are 2+ on the right side and 2+ on the left side.  ?     Posterior tibial pulses are 2+ on the right side and 2+ on the left side.  ?   Heart sounds: Normal heart sounds. No murmur heard. ?  No friction rub. No gallop.  ?Pulmonary:  ?   Effort: Pulmonary effort is  normal. No respiratory distress.  ?   Breath sounds: Normal breath sounds. No stridor. No wheezing, rhonchi or rales.  ?Chest:  ?   Chest wall: No tenderness.  ?Abdominal:  ?   General: Bowel sounds are normal.

## 2021-10-09 LAB — CBC WITH DIFFERENTIAL/PLATELET
Absolute Monocytes: 1109 cells/uL — ABNORMAL HIGH (ref 200–950)
Basophils Absolute: 88 cells/uL (ref 0–200)
Basophils Relative: 1 %
Eosinophils Absolute: 308 cells/uL (ref 15–500)
Eosinophils Relative: 3.5 %
HCT: 45.1 % — ABNORMAL HIGH (ref 35.0–45.0)
Hemoglobin: 14.9 g/dL (ref 11.7–15.5)
Lymphs Abs: 2966 cells/uL (ref 850–3900)
MCH: 30.4 pg (ref 27.0–33.0)
MCHC: 33 g/dL (ref 32.0–36.0)
MCV: 92 fL (ref 80.0–100.0)
MPV: 10 fL (ref 7.5–12.5)
Monocytes Relative: 12.6 %
Neutro Abs: 4330 cells/uL (ref 1500–7800)
Neutrophils Relative %: 49.2 %
Platelets: 211 10*3/uL (ref 140–400)
RBC: 4.9 10*6/uL (ref 3.80–5.10)
RDW: 12.9 % (ref 11.0–15.0)
Total Lymphocyte: 33.7 %
WBC: 8.8 10*3/uL (ref 3.8–10.8)

## 2021-10-09 LAB — COMPLETE METABOLIC PANEL WITH GFR
AG Ratio: 1.5 (calc) (ref 1.0–2.5)
ALT: 16 U/L (ref 6–29)
AST: 16 U/L (ref 10–35)
Albumin: 4.1 g/dL (ref 3.6–5.1)
Alkaline phosphatase (APISO): 52 U/L (ref 37–153)
BUN: 14 mg/dL (ref 7–25)
CO2: 29 mmol/L (ref 20–32)
Calcium: 9.6 mg/dL (ref 8.6–10.4)
Chloride: 103 mmol/L (ref 98–110)
Creat: 0.84 mg/dL (ref 0.60–1.00)
Globulin: 2.8 g/dL (calc) (ref 1.9–3.7)
Glucose, Bld: 94 mg/dL (ref 65–99)
Potassium: 4 mmol/L (ref 3.5–5.3)
Sodium: 141 mmol/L (ref 135–146)
Total Bilirubin: 0.4 mg/dL (ref 0.2–1.2)
Total Protein: 6.9 g/dL (ref 6.1–8.1)
eGFR: 75 mL/min/{1.73_m2} (ref >=60)

## 2021-10-09 LAB — HEMOGLOBIN A1C
Hgb A1c MFr Bld: 6.5 % of total Hgb — ABNORMAL HIGH (ref <5.7)
Mean Plasma Glucose: 140 mg/dL
eAG (mmol/L): 7.7 mmol/L

## 2021-10-09 LAB — VITAMIN B12: Vitamin B-12: 473 pg/mL (ref 200–1100)

## 2021-10-10 ENCOUNTER — Telehealth: Payer: Self-pay

## 2021-10-10 NOTE — Progress Notes (Signed)
? ? ?Chronic Care Management ?Pharmacy Assistant  ? ?Name: Jacqueline [name] [facility]  MRN: [id number] DOB: 1951-05-05 ? ?Reason for Encounter: Medication Review/Medication Coordination Call for [facility] ?  ?Recent office visits:  ?10/08/2021 Delsa Grana, PA-C (PCP Office Visit) for Follow-up- No medication changes noted, Lab orders placed, No follow-up noted ? ?10/01/2021 Rory Percy, DO (PCP Office Visit) for Form Completion- No medication changes noted, No orders placed,  ? ?Recent consult visits:  ?10/02/2021 [name], MD (Dermatology) for Annual Exam- No medication changes noted, No orders placed, patient to follow-up in 1 year ? ?10/01/2021 Alfredia Client, RN ([facility]) for Type II DM- No medication changes noted, No orders placed,  ? ?Hospital visits:  ?None in previous 6 months ? ?Medications: ?Outpatient Encounter Medications as of 10/10/2021  ?Medication Sig Note  ? aspirin 81 MG chewable tablet Chew 81 mg by mouth daily.   ? blood glucose meter kit and supplies KIT Lifescan meter Check blood sugar 2 times daily as needed   ? calcium carbonate (OS-CAL) 600 MG TABS tablet Take 600 mg by mouth 2 (two) times daily with a meal.   ? Cholecalciferol (VITAMIN D) 50 MCG (2000 UT) CAPS Take 1 capsule by mouth daily.   ? ezetimibe (ZETIA) 10 MG tablet Take 1 tablet (10 mg total) by mouth daily.   ? gabapentin (NEURONTIN) 300 MG capsule Take 300 mg by mouth 3 (three) times daily. 08/14/2021: Pt taking once daily   ? Lancets (ONETOUCH DELICA PLUS [id number]) MISC USE AS DIRECTED TWICE A DAY AS NEEDED   ? lisinopril (ZESTRIL) 10 MG tablet Take 1 tablet (10 mg total) by mouth daily.   ? Magnesium Oxide 500 MG CAPS Take 1 capsule by mouth daily.   ? metoprolol tartrate (LOPRESSOR) 25 MG tablet Take 1 tablet (25 mg total) by mouth 2 (two) times daily.   ? ONETOUCH ULTRA test strip USE AS DIRECTED TWICE A DAY AS NEEDED   ? Potassium Gluconate 595 MG CAPS Take 595 mg by mouth daily.   ? rosuvastatin (CRESTOR) 10  MG tablet Take 1 tablet (10 mg total) by mouth daily.   ? traZODone (DESYREL) 50 MG tablet Take 1 tablet (50 mg total) by mouth at bedtime.   ? VITAMIN E PO Take 800 Units by mouth daily.   ? zinc gluconate 50 MG tablet Take 50 mg by mouth daily.   ? ?No facility-administered encounter medications on file as of 10/10/2021.  ? ?Care Gaps: ?None ID ? ?Star Rating Drugs: ?Lisinopril 10 mg last filled on 09/14/2021 for a 30-Day supply with [facility] ?Rosuvastatin 10 mg  last filled on 09/14/2021 for a 30-Day supply with [facility] ? ?BP Readings from Last 3 Encounters:  ?10/08/21 118/74  ?10/01/21 130/80  ?10/01/21 118/72  ?  ?Lab Results  ?Component Value Date  ? HGBA1C 6.5 (H) 10/08/2021  ?  ?Patient obtains medications through Adherence Packaging  30 Days  ? ?Last adherence delivery included:  ?Lisinopril 10 mg 1 tablet daily with (Breakfast) ?Metoprolol Tartrate 25 mg 1 tablet twice daily with (Breakfast and Evening meals) ?Trazodone 50 mg 1 tablet at (Bedtime) ?Rosuvastatin 10 mg 1 tablet daily with (Breakfast ) ? ?Patient declined medications last month: ?Ezetimibe- patient fills this at [facility] as it is cheaper for her than at [facility] ?Gabapentin 300 mg 1 tablet three times daily at [employer], Lunch, and Evening meals)- Patient states she does take this for pain, but if she's not in pain she does not  take it so she does have a good amount that she does not need it filled this month. She stated she will probably need it next mont ? ?Patient is due for next adherence delivery on:  10/22/2021 (Monday) [address]. ? ?Called patient and reviewed medications and coordinated delivery. ? ?This delivery to include: ?Lisinopril 10 mg 1 tablet daily with (Breakfast) ?Metoprolol Tartrate 25 mg 1 tablet twice daily with (Breakfast and Evening meals) ?Trazodone 50 mg 1 tablet at (Bedtime) ?Rosuvastatin 10 mg 1 tablet daily with (Breakfast ) ?Gabapentin 300 mg 1 tablet three times daily at [employer], Lunch, and  Evening meals) ? ?Patient declined the following medications: ?Ezetimibe- patient fills this at [facility] as it is cheaper for her than at [facility] ? ?Patient needs refills for nothing at this time. ? ?Confirmed delivery date of 10/22/2021 [address], advised patient that pharmacy will contact them the morning of delivery. ? ?Patient has a telephone appointment scheduled with [name], CPP on 01/30/2022 @ 0830 ? ?Lynann Bologna, CPA/CMA ?Clinical Pharmacist Assistant ?Phone: [phone]  ? ? ? ? ?

## 2021-10-12 ENCOUNTER — Other Ambulatory Visit: Payer: Self-pay

## 2021-10-12 ENCOUNTER — Telehealth: Payer: Self-pay | Admitting: *Deleted

## 2021-10-12 DIAGNOSIS — I479 Paroxysmal tachycardia, unspecified: Secondary | ICD-10-CM | POA: Insufficient documentation

## 2021-10-12 DIAGNOSIS — E559 Vitamin D deficiency, unspecified: Secondary | ICD-10-CM | POA: Insufficient documentation

## 2021-10-12 DIAGNOSIS — E119 Type 2 diabetes mellitus without complications: Secondary | ICD-10-CM

## 2021-10-12 DIAGNOSIS — I5032 Chronic diastolic (congestive) heart failure: Secondary | ICD-10-CM | POA: Insufficient documentation

## 2021-10-12 NOTE — Telephone Encounter (Signed)
I have copied the note and forward to PCP Leisa. ?

## 2021-10-12 NOTE — Telephone Encounter (Signed)
Pt returned call for lab results. Notes from provider given to pt. Pt wishes to try diet and exercise at this point and does not want to start on any diabetic medication. She scheduled her 6 month follow up appt. ? ? Delsa Grana, PA-C  ?10/12/2021  8:36 AM EDT   ?  ?Labs show she is back in Diabetic range with her A1C - work on diet and exercise - we can add medications that may help ?Her B12 is at a normal level - continue same dose monthly B12 injections ?All other labs look okay ?Her hematocrit is a little elevated- we'll monitor that  ?  ?(With A1C back in DM range - maricela can you add back on type 2 DM dx to chart and put in ophtho referral for DM eye exam - ask pt if she's got an eye dr and put that name in comments with need for dm eye exam) ?thanks  ? ?

## 2021-10-31 ENCOUNTER — Telehealth: Payer: Medicare HMO

## 2021-11-06 ENCOUNTER — Other Ambulatory Visit: Payer: Self-pay | Admitting: Cardiovascular Disease

## 2021-11-08 ENCOUNTER — Telehealth: Payer: Self-pay

## 2021-11-08 NOTE — Progress Notes (Signed)
? ? ?Chronic Care Management ?Pharmacy Assistant  ? ?Name: Jacqueline [name] [facility]  MRN: [id number] DOB: [birth year] ? ?Reason for Encounter: Medication Review/Medication Coordination Call for [facility] ?  ?Recent office visits:  ?None ID ? ?Recent consult visits:  ?None ID ? ?Hospital visits:  ?None in previous 6 months ? ?Medications: ?Outpatient Encounter Medications as of 11/08/2021  ?Medication Sig Note  ? aspirin 81 MG chewable tablet Chew 81 mg by mouth daily.   ? blood glucose meter kit and supplies KIT Lifescan meter Check blood sugar 2 times daily as needed   ? calcium carbonate (OS-CAL) 600 MG TABS tablet Take 600 mg by mouth 2 (two) times daily with a meal.   ? Cholecalciferol (VITAMIN D) 50 MCG (2000 UT) CAPS Take 1 capsule by mouth daily.   ? ezetimibe (ZETIA) 10 MG tablet Take 1 tablet (10 mg total) by mouth daily.   ? gabapentin (NEURONTIN) 300 MG capsule Take 300 mg by mouth 3 (three) times daily. 08/14/2021: Pt taking once daily   ? Lancets (ONETOUCH DELICA PLUS [id number]) MISC USE AS DIRECTED TWICE A DAY AS NEEDED   ? lisinopril (ZESTRIL) 10 MG tablet Take 1 tablet (10 mg total) by mouth daily.   ? Magnesium Oxide 500 MG CAPS Take 1 capsule by mouth daily.   ? metoprolol tartrate (LOPRESSOR) 25 MG tablet TAKE ONE TABLET BY MOUTH TWICE DAILY   ? ONETOUCH ULTRA test strip USE AS DIRECTED TWICE A DAY AS NEEDED   ? Potassium Gluconate 595 MG CAPS Take 595 mg by mouth daily.   ? rosuvastatin (CRESTOR) 10 MG tablet Take 1 tablet (10 mg total) by mouth daily.   ? traZODone (DESYREL) 50 MG tablet Take 1 tablet (50 mg total) by mouth at bedtime.   ? VITAMIN E PO Take 800 Units by mouth daily.   ? zinc gluconate 50 MG tablet Take 50 mg by mouth daily.   ? ?No facility-administered encounter medications on file as of 11/08/2021.  ? ?Care Gaps: ?COVID-19 Vaccine Booster 3 ?Diabetic Foot Exam ? ?Star Rating Drugs: ?Lisinopril 10 mg last filled on 10/17/2021 for a 30-Day supply with [facility] ?Rosuvastatin  10 mg  last filled on 10/17/2021 for a 30-Day supply with [facility] ? ?BP Readings from Last 3 Encounters:  ?10/08/21 118/74  ?10/01/21 130/80  ?10/01/21 118/72  ?  ?Lab Results  ?Component Value Date  ? HGBA1C 6.5 (H) 10/08/2021  ?  ?Patient obtains medications through Adherence Packaging  30 Days  ? ?Last adherence delivery included: ?Lisinopril 10 mg 1 tablet daily with (Breakfast) ?Metoprolol Tartrate 25 mg 1 tablet twice daily with (Breakfast and Evening meals) ?Trazodone 50 mg 1 tablet at (Bedtime) ?Rosuvastatin 10 mg 1 tablet daily with (Breakfast ) ?Gabapentin 300 mg 1 tablet three times daily at [employer], Lunch, and Evening meals) ? ?Patient declined medications last month: ?Ezetimibe- patient fills this at [facility] as it is cheaper for her than at [facility] ? ?Patient is due for next adherence delivery on:  11/20/2021 (Tuesday) [address] between 11 am and 3 pm. ? ?Called patient and reviewed medications and coordinated delivery. ? ?This delivery to include: ?Lisinopril 10 mg 1 tablet daily with (Breakfast) ?Metoprolol Tartrate 25 mg 1 tablet twice daily with (Breakfast and Evening meals) ?Trazodone 50 mg 1 tablet at (Bedtime) ?Rosuvastatin 10 mg 1 tablet daily with (Breakfast ) ? ?Patient declined the following medications: ?Ezetimibe- patient fills this at [facility] as it is cheaper for her than at [facility] ?  Gabapentin 300 mg 1 tablet three times daily at [employer], Lunch, and Evening meals)-Patient stated she has been taking only when needed so she has enough at this time ? ?Patient needs refills for Gabapentin 300 mg the request has been sent to the patient's specialist by [facility]. ? ?Confirmed delivery date of 11/20/2021 [address], advised patient that pharmacy will contact them the morning of delivery. ? ?Patient has a telephone follow-up appointment with [name], CPP on 01/30/2022 @ 0830. ? ?Lynann Bologna, CPA/CMA ?Clinical Pharmacist Assistant ?Phone: [phone]  ? ? ? ?

## 2021-11-12 ENCOUNTER — Ambulatory Visit (INDEPENDENT_AMBULATORY_CARE_PROVIDER_SITE_OTHER): Payer: Medicare HMO

## 2021-11-12 DIAGNOSIS — E538 Deficiency of other specified B group vitamins: Secondary | ICD-10-CM | POA: Diagnosis not present

## 2021-11-12 MED ORDER — CYANOCOBALAMIN 1000 MCG/ML IJ SOLN
1000.0000 ug | Freq: Once | INTRAMUSCULAR | Status: AC
  Administered 2021-11-12: 1000 ug via INTRAMUSCULAR

## 2021-11-20 ENCOUNTER — Other Ambulatory Visit: Payer: Self-pay | Admitting: Cardiovascular Disease

## 2021-11-20 DIAGNOSIS — I700 Atherosclerosis of aorta: Secondary | ICD-10-CM

## 2021-11-20 DIAGNOSIS — E785 Hyperlipidemia, unspecified: Secondary | ICD-10-CM

## 2021-11-20 NOTE — Telephone Encounter (Signed)
Scheduled

## 2021-11-20 NOTE — Telephone Encounter (Signed)
Please schedule 6 month F/U appointment. Thank you! 

## 2021-11-26 DIAGNOSIS — H5203 Hypermetropia, bilateral: Secondary | ICD-10-CM | POA: Diagnosis not present

## 2021-11-26 LAB — HM DIABETES EYE EXAM

## 2021-12-10 ENCOUNTER — Telehealth: Payer: Self-pay

## 2021-12-10 ENCOUNTER — Ambulatory Visit (INDEPENDENT_AMBULATORY_CARE_PROVIDER_SITE_OTHER): Payer: Medicare HMO

## 2021-12-10 DIAGNOSIS — E538 Deficiency of other specified B group vitamins: Secondary | ICD-10-CM

## 2021-12-10 MED ORDER — CYANOCOBALAMIN 1000 MCG/ML IJ SOLN
1000.0000 ug | Freq: Once | INTRAMUSCULAR | Status: AC
  Administered 2021-12-10: 1000 ug via INTRAMUSCULAR

## 2021-12-10 NOTE — Progress Notes (Signed)
Chronic Care Management Pharmacy Assistant   Name: Von Inscoe [facility]  MRN: [id number] DOB: [birth year]  Reason for Encounter: Medication Review/Medication Coordination for [facility]  Recent office visits:  None ID  Recent consult visits:  None ID  Hospital visits:  None in previous 6 months  Medications: Outpatient Encounter Medications as of 12/10/2021  Medication Sig Note   aspirin 81 MG chewable tablet Chew 81 mg by mouth daily.    blood glucose meter kit and supplies KIT Lifescan meter Check blood sugar 2 times daily as needed    calcium carbonate (OS-CAL) 600 MG TABS tablet Take 600 mg by mouth 2 (two) times daily with a meal.    Cholecalciferol (VITAMIN D) 50 MCG (2000 UT) CAPS Take 1 capsule by mouth daily.    ezetimibe (ZETIA) 10 MG tablet Take 1 tablet by mouth once daily    gabapentin (NEURONTIN) 300 MG capsule Take 300 mg by mouth 3 (three) times daily. 08/14/2021: Pt taking once daily    Lancets (ONETOUCH DELICA PLUS [id number]) MISC USE AS DIRECTED TWICE A DAY AS NEEDED    lisinopril (ZESTRIL) 10 MG tablet Take 1 tablet (10 mg total) by mouth daily.    Magnesium Oxide 500 MG CAPS Take 1 capsule by mouth daily.    metoprolol tartrate (LOPRESSOR) 25 MG tablet TAKE ONE TABLET BY MOUTH TWICE DAILY    ONETOUCH ULTRA test strip USE AS DIRECTED TWICE A DAY AS NEEDED    Potassium Gluconate 595 MG CAPS Take 595 mg by mouth daily.    rosuvastatin (CRESTOR) 10 MG tablet Take 1 tablet (10 mg total) by mouth daily.    traZODone (DESYREL) 50 MG tablet Take 1 tablet (50 mg total) by mouth at bedtime.    VITAMIN E PO Take 800 Units by mouth daily.    zinc gluconate 50 MG tablet Take 50 mg by mouth daily.    No facility-administered encounter medications on file as of 12/10/2021.   Care Gaps: COVID-19 Booster 3 Diabetic Foot Exam  Star Rating Drugs: Lisinopril 10 mg last filled on 11/12/2021 for a 30-Day supply with [facility] Rosuvastatin 10 mg  last filled on  11/12/2021 for a 30-Day supply with [facility]  BP Readings from Last 3 Encounters:  10/08/21 118/74  10/01/21 130/80  10/01/21 118/72    Lab Results  Component Value Date   HGBA1C 6.5 (H) 10/08/2021    Patient obtains medications through Adherence Packaging  30 Days   Last adherence delivery included:  Lisinopril 10 mg 1 tablet daily with (Breakfast) Metoprolol Tartrate 25 mg 1 tablet twice daily with (Breakfast and Evening meals) Trazodone 50 mg 1 tablet at (Bedtime) Rosuvastatin 10 mg 1 tablet daily with (Breakfast )   Patient declined the following medications: Ezetimibe- patient fills this at [facility] as it is cheaper for her than at [facility] Gabapentin 300 mg 1 tablet three times daily at [employer], Lunch, and Evening meals)-Patient stated she has been taking only when needed so she has enough at this time  Patient is due for next adherence delivery on: 12/20/2021 (Thursday) [address] . Called patient and reviewed medications and coordinated delivery.  This delivery to include: Lisinopril 10 mg 1 tablet daily with (Breakfast) Metoprolol Tartrate 25 mg 1 tablet twice daily with (Breakfast and Evening meals) Trazodone 50 mg 1 tablet at (Bedtime) Rosuvastatin 10 mg 1 tablet daily with (Breakfast ) Gabapentin 300 mg 1 tablet three times daily at [employer], Lunch, and Evening meals)  Patient  declined the following medications for the month of June: Ezetimibe- patient fills this at [facility] as it is cheaper for her than at [facility]  Patient needs refills for: No refills needed at this time  Confirmed delivery date of 12/20/2021 [address], advised patient that pharmacy will contact them the morning of delivery.  I spoke to patient and she reports she is doing well. Patient denies any ill symptoms at this time. Patient has no concerns or issues today.  Patient has a follow-up appointment with [name], CPP on 01/30/2022 @ 0830.  Lynann Bologna, CPA/CMA Clinical  Pharmacist Assistant Phone: [phone]

## 2021-12-13 DIAGNOSIS — R252 Cramp and spasm: Secondary | ICD-10-CM | POA: Diagnosis not present

## 2021-12-13 DIAGNOSIS — R202 Paresthesia of skin: Secondary | ICD-10-CM | POA: Diagnosis not present

## 2021-12-13 DIAGNOSIS — R200 Anesthesia of skin: Secondary | ICD-10-CM | POA: Diagnosis not present

## 2021-12-13 DIAGNOSIS — G629 Polyneuropathy, unspecified: Secondary | ICD-10-CM | POA: Diagnosis not present

## 2022-01-06 ENCOUNTER — Other Ambulatory Visit: Payer: Self-pay | Admitting: Family Medicine

## 2022-01-06 DIAGNOSIS — I10 Essential (primary) hypertension: Secondary | ICD-10-CM

## 2022-01-06 DIAGNOSIS — E785 Hyperlipidemia, unspecified: Secondary | ICD-10-CM

## 2022-01-06 DIAGNOSIS — G4700 Insomnia, unspecified: Secondary | ICD-10-CM

## 2022-01-06 DIAGNOSIS — I700 Atherosclerosis of aorta: Secondary | ICD-10-CM

## 2022-01-07 ENCOUNTER — Ambulatory Visit: Payer: Medicare HMO | Admitting: Cardiology

## 2022-01-07 ENCOUNTER — Encounter: Payer: Self-pay | Admitting: Medical

## 2022-01-07 ENCOUNTER — Telehealth: Payer: Self-pay

## 2022-01-07 VITALS — BP 110/70 | HR 68 | Ht 64.0 in | Wt 227.0 lb

## 2022-01-07 DIAGNOSIS — R002 Palpitations: Secondary | ICD-10-CM | POA: Diagnosis not present

## 2022-01-07 DIAGNOSIS — I5032 Chronic diastolic (congestive) heart failure: Secondary | ICD-10-CM

## 2022-01-07 DIAGNOSIS — I10 Essential (primary) hypertension: Secondary | ICD-10-CM | POA: Diagnosis not present

## 2022-01-07 DIAGNOSIS — M7989 Other specified soft tissue disorders: Secondary | ICD-10-CM

## 2022-01-07 DIAGNOSIS — I479 Paroxysmal tachycardia, unspecified: Secondary | ICD-10-CM

## 2022-01-07 DIAGNOSIS — E785 Hyperlipidemia, unspecified: Secondary | ICD-10-CM

## 2022-01-07 NOTE — Progress Notes (Signed)
Chronic Care Management Pharmacy Assistant   Name: Jacqueline [name] [facility]  MRN: [id number] DOB: January 01, 1951  Reason for Encounter: Medication Review/Medication Coordination Call for [facility]   Recent office visits:  12/13/2021 Rufina Falco, [place]- (Neurology) for Leg Cramping- Provider advised patient could increase Gabapentin from 300 mg to 600 mg nightly for leg cramping, No orders placed, patient to follow-up in 1 year  Recent consult visits:  None ID  Hospital visits:  None in previous 6 months  Medications: Outpatient Encounter Medications as of 01/07/2022  Medication Sig Note   aspirin 81 MG chewable tablet Chew 81 mg by mouth daily.    blood glucose meter kit and supplies KIT Lifescan meter Check blood sugar 2 times daily as needed    calcium carbonate (OS-CAL) 600 MG TABS tablet Take 600 mg by mouth 2 (two) times daily with a meal.    Cholecalciferol (VITAMIN D) 50 MCG (2000 UT) CAPS Take 1 capsule by mouth daily.    ezetimibe (ZETIA) 10 MG tablet Take 1 tablet by mouth once daily    gabapentin (NEURONTIN) 300 MG capsule Take 300 mg by mouth 3 (three) times daily. 08/14/2021: Pt taking once daily    Lancets (ONETOUCH DELICA PLUS [id number]) MISC USE AS DIRECTED TWICE A DAY AS NEEDED    lisinopril (ZESTRIL) 10 MG tablet Take 1 tablet (10 mg total) by mouth daily.    Magnesium Oxide 500 MG CAPS Take 1 capsule by mouth daily.    metoprolol tartrate (LOPRESSOR) 25 MG tablet TAKE ONE TABLET BY MOUTH TWICE DAILY    ONETOUCH ULTRA test strip USE AS DIRECTED TWICE A DAY AS NEEDED    Potassium Gluconate 595 MG CAPS Take 595 mg by mouth daily.    rosuvastatin (CRESTOR) 10 MG tablet Take 1 tablet (10 mg total) by mouth daily.    traZODone (DESYREL) 50 MG tablet Take 1 tablet (50 mg total) by mouth at bedtime.    VITAMIN E PO Take 800 Units by mouth daily.    zinc gluconate 50 MG tablet Take 50 mg by mouth daily.    No facility-administered encounter medications on file as  of 01/07/2022.   Care Gaps: COVID-19 Vaccine Booster 3 Diabetic Foot Exam  Star Rating Drugs: Lisinopril 10 mg last filled on 12/17/2021 for a 30-Day supply with [facility] Rosuvastatin 10 mg  last filled on 12/17/2021 for a 30-Day supply with [facility]  Reviewed chart for medication changes ahead of medication coordination call.  No OVs, Consults, or hospital visits since last care coordination call/Pharmacist visit. (If appropriate, list visit date, provider name)  No medication changes indicated OR if recent visit, treatment plan here.  BP Readings from Last 3 Encounters:  10/08/21 118/74  10/01/21 130/80  10/01/21 118/72    Lab Results  Component Value Date   HGBA1C 6.5 (H) 10/08/2021    Patient obtains medications through Adherence Packaging  30 Days   Last adherence delivery included:  Lisinopril 10 mg 1 tablet daily with (Breakfast) Metoprolol Tartrate 25 mg 1 tablet twice daily with (Breakfast and Evening meals) Trazodone 50 mg 1 tablet at (Bedtime) Rosuvastatin 10 mg 1 tablet daily with (Breakfast ) Gabapentin 300 mg 1 tablet three times daily at [employer], Lunch, and Evening meals)   Patient declined the following medications for the month of June: Ezetimibe- patient fills this at [facility] as it is cheaper for her than at [facility]  Patient is due for next adherence delivery on: 01/18/2022 (Friday) [address]. Called patient and reviewed medications and coordinated delivery.  This delivery to include: Lisinopril 10 mg 1 tablet daily with (Breakfast) Metoprolol Tartrate 25 mg 1 tablet twice daily with (Breakfast and Evening meals) Trazodone 50 mg 1 tablet at (Bedtime) Rosuvastatin 10 mg 1 tablet daily with (Breakfast )  Patient declined the following medications for the month of July: Ezetimibe- patient fills this at [facility] as it is cheaper for her than at [facility] Gabapentin 300 mg 1 tablet three times daily at [employer], Lunch, and Evening  meals)  Patient needs refills for Gabapentin 300 mg (Specialist medication) request should have been sent by [facility].  Confirmed delivery date of 01/18/2022 [address], advised patient that pharmacy will contact them the morning of delivery.  Patient advised they are typically not home on Fridays but it can be put in her garage if she is not home. Noted on the form for [facility].  Patient has a telephone follow-up appointment scheduled with [name], CPP on 01/30/2022 _0 .  Lynann Bologna, CPA/CMA Clinical Pharmacist Assistant Phone: [phone]

## 2022-01-07 NOTE — Patient Instructions (Signed)
Medication Instructions:  Your physician recommends that you continue on your current medications as directed. Please refer to the Current Medication list given to you today.  *If you need a refill on your cardiac medications before your next appointment, please call your pharmacy*   Lab Work: None ordered If you have labs (blood work) drawn today and your tests are completely normal, you will receive your results only by: [place] (if you have MyChart) OR A paper copy in the mail If you have any lab test that is abnormal or we need to change your treatment, we will call you to review the results.   Testing/Procedures: None ordered   Follow-Up: At [facility], you and your health needs are our priority.  As part of our continuing mission to provide you with exceptional heart care, we have created designated Provider Care Teams.  These Care Teams include your primary Cardiologist (physician) and Advanced Practice Providers (APPs -  Physician Assistants and Nurse Practitioners) who all work together to provide you with the care you need, when you need it.  We recommend signing up for the patient portal called "MyChart".  Sign up information is provided on this After Visit Summary.  MyChart is used to connect with patients for Virtual Visits (Telemedicine).  Patients are able to view lab/test results, encounter notes, upcoming appointments, etc.  Non-urgent messages can be sent to your provider as well.   To learn more about what you can do with MyChart, go to [email or web address].    Your next appointment:   6 month(s)  The format for your next appointment:   In Person  Provider:   You may see Ida Rogue, MD or one of the following Advanced Practice Providers on your designated Care Team:   Murray Hodgkins, NP Christell Faith, PA-C Cadence Kathlen Mody, [place]   Other Instructions N/A  Important Information About Sugar

## 2022-01-07 NOTE — Progress Notes (Signed)
Cardiology Clinic Note   Patient Name: [name] [facility] Date of Encounter: 01/07/2022  Primary Care Provider:  Delsa Grana, PA-C Primary Cardiologist: Ida Rogue, MD  Patient Profile    71 year old female with a past medical history of essential hypertension, hyperlipidemia, borderline diabetes, former smoker (quit 2013), paroxysmal tachycardia has been seen today for follow-up on her palpitations.  Past Medical History    Past Medical History:  Diagnosis Date   Calcium oxalate renal stones    Diabetes mellitus without complication (HCC)    Hyperlipidemia    Hypertension    MVP (mitral valve prolapse)    Past Surgical History:  Procedure Laterality Date   BREAST BIOPSY Right 08/19/2012   neg, Dr. Bary Castilla   extraction of renal stones     TONSILLECTOMY AND ADENOIDECTOMY     VAGINAL HYSTERECTOMY  1980   class IV, ovaries intact    Allergies  No Known Allergies  History of Present Illness    Jacqueline [name] is a 71 year old female with the above-mentioned past medical history of essential hypertension, hyperlipidemia, borderline diabetes, former smoker (quit 2013), paroxysmal tachycardia.  She has previously worn a heart monitor that showed predominantly normal sinus rhythm with 5 runs of SVT with the longest interval lasting 6 beats, rare ectopy, triggered events with PVCs.  She was last seen in the office in 06/12/2021 where she had denied any further palpitations and was continued on her previous metoprolol dosing.  Of note on 03/14/2021 she was seen and reported improved palpitations on metoprolol she had an echocardiogram that was ordered that showed an LVEF of 60 to 65%, no wall motion abnormalities, grade 1 diastolic dysfunction, trivial pericardial effusion.  Today she follows up on her palpitations without any changes noted.  She has continued on her previous metoprolol dosing without incident.  She has had issues with her lower extremities primarily her left lower  extremity with worsening neuropathy and occasional leg cramping.  She had followed up with her PCP who increased her gabapentin. Previously she had been advised on conservative therapy.  She denies any recent hospitalizations or visits to the emergency department.  Home Medications    Current Outpatient Medications  Medication Sig Dispense Refill   aspirin 81 MG chewable tablet Chew 81 mg by mouth daily.     blood glucose meter kit and supplies KIT Lifescan meter Check blood sugar 2 times daily as needed 1 each 0   calcium carbonate (OS-CAL) 600 MG TABS tablet Take 600 mg by mouth 2 (two) times daily with a meal. 30 tablet 0   Cholecalciferol (VITAMIN D) 50 MCG (2000 UT) CAPS Take 1 capsule by mouth daily. 30 capsule    ezetimibe (ZETIA) 10 MG tablet Take 1 tablet by mouth once daily 90 tablet 0   gabapentin (NEURONTIN) 300 MG capsule Take 1 tablet in the AM and 2 tablets in the evening     Lancets (ONETOUCH DELICA PLUS [id number]) MISC USE AS DIRECTED TWICE A DAY AS NEEDED 200 each 0   lisinopril (ZESTRIL) 10 MG tablet TAKE ONE TABLET BY MOUTH ONCE DAILY 90 tablet 1   Magnesium Oxide 500 MG CAPS Take 1 capsule by mouth daily.     metoprolol tartrate (LOPRESSOR) 25 MG tablet TAKE ONE TABLET BY MOUTH TWICE DAILY 180 tablet 3   ONETOUCH ULTRA test strip USE AS DIRECTED TWICE A DAY AS NEEDED 200 strip 1   Potassium Gluconate 595 MG CAPS Take 595 mg by mouth daily.  rosuvastatin (CRESTOR) 10 MG tablet TAKE ONE TABLET BY MOUTH ONCE DAILY 90 tablet 1   traZODone (DESYREL) 50 MG tablet TAKE ONE TABLET BY MOUTH EVERYDAY AT BEDTIME 90 tablet 1   VITAMIN E PO Take 800 Units by mouth daily.     zinc gluconate 50 MG tablet Take 50 mg by mouth daily.     No current facility-administered medications for this visit.     Family History    Family History  Problem Relation Age of Onset   Hypertension Mother    Dementia Mother    Stroke Mother    Cancer - Other Father        Bile Duct   Heart  disease Maternal Grandmother    Heart disease Maternal Grandfather    Hypertension Paternal Grandmother    Cancer Maternal Aunt        breast and lung   Stroke Paternal Grandfather    She indicated that her mother is deceased. She indicated that her father is deceased. She indicated that her sister is alive. She indicated that her maternal grandmother is deceased. She indicated that her maternal grandfather is deceased. She indicated that her paternal grandmother is deceased. She indicated that her paternal grandfather is deceased. She indicated that her daughter is alive. She indicated that her son is alive. She indicated that her maternal aunt is deceased.  Social History    Social History   Socioeconomic History   Marital status: Married    Spouse name: Richardson Landry   Number of children: 2   Years of education: Not on file   Highest education level: Some college, no degree  Occupational History   Occupation: Naval architect  Tobacco Use   Smoking status: Former    Packs/day: 0.25    Years: 30.00    Total pack years: 7.50    Types: Cigarettes    Start date: 07/09/1983    Quit date: 05/27/2014    Years since quitting: 7.6   Smokeless tobacco: Never  Vaping Use   Vaping Use: Never used  Substance and Sexual Activity   Alcohol use: Yes    Alcohol/week: 4.0 standard drinks of alcohol    Types: 4 Glasses of wine per week    Comment: occasional   Drug use: No   Sexual activity: Yes    Partners: Male  Other Topics Concern   Not on file  Social History Narrative   Not on file   Social Determinants of Health   Financial Resource Strain: Low Risk  (08/14/2021)   Overall Financial Resource Strain (CARDIA)    Difficulty of Paying Living Expenses: Not hard at all  Food Insecurity: No Food Insecurity (08/14/2021)   Hunger Vital Sign    Worried About Running Out of Food in the Last Year: Never true    Ran Out of Food in the Last Year: Never true  Transportation Needs: No  Transportation Needs (08/14/2021)   PRAPARE - Hydrologist (Medical): No    Lack of Transportation (Non-Medical): No  Physical Activity: Sufficiently Active (08/14/2021)   Exercise Vital Sign    Days of Exercise per Week: 7 days    Minutes of Exercise per Session: 30 min  Stress: No Stress Concern Present (08/14/2021)   [place]    Feeling of Stress : Not at all  Social Connections: [place] (08/14/2021)   Social Connection and Isolation Panel [NHANES]    Frequency  of Communication with Friends and Family: More than three times a week    Frequency of Social Gatherings with Friends and Family: More than three times a week    Attends Religious Services: More than 4 times per year    Active Member of [employer] or Organizations: Yes    Attends Music therapist: More than 4 times per year    Marital Status: Married  Human resources officer Violence: Not At Risk (08/14/2021)   Humiliation, Afraid, Rape, and Kick questionnaire    Fear of Current or Ex-Partner: No    Emotionally Abused: No    Physically Abused: No    Sexually Abused: No     Review of Systems    General:  No chills, fever, night sweats or weight changes.  Cardiovascular:  No chest pain, dyspnea on exertion, orthopnea, palpitations, paroxysmal nocturnal dyspnea. Endorses edema. Dermatological: No rash, lesions/masses Respiratory: No cough, dyspnea Urologic: No hematuria, dysuria Abdominal:   No nausea, vomiting, diarrhea, bright red blood per rectum, melena, or hematemesis Neurologic:  No visual changes, wkns, changes in mental status. All other systems reviewed and are otherwise negative except as noted above.     Physical Exam    VS:  BP 110/70 (BP Location: Left Arm, Patient Position: Sitting, Cuff Size: Large)   Pulse 68   Ht 5' 4"  (1.626 m)   Wt 227 lb (103 kg)   SpO2 96%   BMI 38.96 kg/m  , BMI Body mass  index is 38.96 kg/m.     GEN: Well nourished, well developed, in no acute distress. HEENT: normal. Neck: Supple, no JVD, carotid bruits, or masses. Cardiac: RRR, no murmurs, rubs, or gallops. No clubbing, cyanosis, trace edema and venous discoloration to bilateral ankles.  Radials/DP/PT 2+ and equal bilaterally.  Respiratory:  Respirations regular and unlabored, clear to auscultation bilaterally. GI: Soft, nontender, nondistended, BS + x 4. MS: no deformity or atrophy. Skin: warm and dry, no rash. Neuro:  Strength and sensation are intact. Psych: Normal affect.  Accessory Clinical Findings    ECG personally reviewed by me today- Sinus rhythm rate of 68, LVH, and left axis deviation- No acute changes  Lab Results  Component Value Date   WBC 8.8 10/08/2021   HGB 14.9 10/08/2021   HCT 45.1 (H) 10/08/2021   MCV 92.0 10/08/2021   PLT 211 10/08/2021   Lab Results  Component Value Date   CREATININE 0.84 10/08/2021   BUN 14 10/08/2021   NA 141 10/08/2021   K 4.0 10/08/2021   CL 103 10/08/2021   CO2 29 10/08/2021   Lab Results  Component Value Date   ALT 16 10/08/2021   AST 16 10/08/2021   ALKPHOS 68 06/01/2019   BILITOT 0.4 10/08/2021   Lab Results  Component Value Date   CHOL 126 04/12/2021   HDL 47 04/12/2021   LDLCALC 62 04/12/2021   LDLDIRECT 149.5 12/25/2006   TRIG 90 04/12/2021   CHOLHDL 2.7 04/12/2021    Lab Results  Component Value Date   HGBA1C 6.5 (H) 10/08/2021    Assessment & Plan   1.  Essential hypertension: Blood pressure today 110/70, continues to take lisinopril 10 mg daily as well as metoprolol 25 mg twice daily.  No changes made to medication today  2.  Hyperlipidemia LDL of 62 on 04/12/2021, continue on rosuvastatin 10 mg daily, current LDL at goal of less than 70  3. Palpitations with h/o paroxysmal tachycardia no recurrent issues with palpitations.  Continue metoprolol  25 mg twice daily  4. HFpEF with peripheral edema: trace edema to  bilateral lower extremities predominantly around the ankles.  Patient is suffering from neuropathy-like symptoms and occasional leg cramping which could be venous insufficiency.  She has been instructed to participate in conservative therapy of elevating her extremities, foot Pumps, decrease her sodium intake, and wear compression stockings.  Unfortunately in the summertime it is too hot to wear the compression stockings but she has been more likely to elevate her extremities in the afternoon.  We did discuss venous ablation procedure performed by vascular and at this time her symptoms is not to the point where she is interested in any procedure related to varicose veins or venous insufficiency.  5. Peripheral Neuropathy scheduled gabapentin was just recently increased by PCP continue with management per PCP  Disposition: Follow up in 6 months with MD/APP  Dariusz Brase, NP 01/07/2022, 3:31 PM

## 2022-01-09 ENCOUNTER — Encounter: Payer: Self-pay | Admitting: Family Medicine

## 2022-01-09 ENCOUNTER — Ambulatory Visit
Admission: RE | Admit: 2022-01-09 | Discharge: 2022-01-09 | Disposition: A | Discharge status: Home / Self Care | Payer: Medicare HMO | Source: Ambulatory Visit | Attending: Family Medicine | Admitting: Family Medicine

## 2022-01-09 ENCOUNTER — Ambulatory Visit (INDEPENDENT_AMBULATORY_CARE_PROVIDER_SITE_OTHER): Payer: Medicare HMO | Admitting: Family Medicine

## 2022-01-09 ENCOUNTER — Other Ambulatory Visit
Admission: RE | Admit: 2022-01-09 | Discharge: 2022-01-09 | Disposition: A | Discharge status: Home / Self Care | Payer: Medicare HMO | Source: Ambulatory Visit | Attending: Family Medicine | Admitting: Family Medicine

## 2022-01-09 VITALS — BP 132/70 | HR 74 | Temp 97.7°F | Resp 16 | Ht 64.0 in | Wt 227.4 lb

## 2022-01-09 DIAGNOSIS — M791 Myalgia, unspecified site: Secondary | ICD-10-CM

## 2022-01-09 DIAGNOSIS — G629 Polyneuropathy, unspecified: Secondary | ICD-10-CM

## 2022-01-09 DIAGNOSIS — E119 Type 2 diabetes mellitus without complications: Secondary | ICD-10-CM | POA: Diagnosis not present

## 2022-01-09 DIAGNOSIS — E538 Deficiency of other specified B group vitamins: Secondary | ICD-10-CM | POA: Diagnosis not present

## 2022-01-09 DIAGNOSIS — M79604 Pain in right leg: Secondary | ICD-10-CM | POA: Insufficient documentation

## 2022-01-09 DIAGNOSIS — R7989 Other specified abnormal findings of blood chemistry: Secondary | ICD-10-CM

## 2022-01-09 DIAGNOSIS — M79661 Pain in right lower leg: Secondary | ICD-10-CM | POA: Diagnosis not present

## 2022-01-09 DIAGNOSIS — Z6839 Body mass index (BMI) 39.0-39.9, adult: Secondary | ICD-10-CM

## 2022-01-09 LAB — COMPREHENSIVE METABOLIC PANEL
ALT: 18 U/L (ref 0–44)
AST: 18 U/L (ref 15–41)
Albumin: 3.7 g/dL (ref 3.5–5.0)
Alkaline Phosphatase: 53 U/L (ref 38–126)
Anion gap: 10 (ref 5–15)
BUN: 15 mg/dL (ref 8–23)
CO2: 27 mmol/L (ref 22–32)
Calcium: 9 mg/dL (ref 8.9–10.3)
Chloride: 103 mmol/L (ref 98–111)
Creatinine, Ser: 0.82 mg/dL (ref 0.44–1.00)
GFR, Estimated: >60 mL/min (ref >60)
Glucose, Bld: 116 mg/dL — ABNORMAL HIGH (ref 70–99)
Potassium: 4.1 mmol/L (ref 3.5–5.1)
Sodium: 140 mmol/L (ref 135–145)
Total Bilirubin: 0.8 mg/dL (ref 0.3–1.2)
Total Protein: 6.9 g/dL (ref 6.5–8.1)

## 2022-01-09 LAB — CBC WITH DIFFERENTIAL/PLATELET
Abs Immature Granulocytes: 0.02 10*3/uL (ref 0.00–0.07)
Basophils Absolute: 0.1 10*3/uL (ref 0.0–0.1)
Basophils Relative: 1 %
Eosinophils Absolute: 0.4 10*3/uL (ref 0.0–0.5)
Eosinophils Relative: 5 %
HCT: 42.3 % (ref 36.0–46.0)
Hemoglobin: 14 g/dL (ref 12.0–15.0)
Immature Granulocytes: 0 %
Lymphocytes Relative: 37 %
Lymphs Abs: 3 10*3/uL (ref 0.7–4.0)
MCH: 30.3 pg (ref 26.0–34.0)
MCHC: 33.1 g/dL (ref 30.0–36.0)
MCV: 91.6 fL (ref 80.0–100.0)
Monocytes Absolute: 1.1 10*3/uL — ABNORMAL HIGH (ref 0.1–1.0)
Monocytes Relative: 13 %
Neutro Abs: 3.5 10*3/uL (ref 1.7–7.7)
Neutrophils Relative %: 44 %
Platelets: 199 10*3/uL (ref 150–400)
RBC: 4.62 MIL/uL (ref 3.87–5.11)
RDW: 13.1 % (ref 11.5–15.5)
WBC: 8 10*3/uL (ref 4.0–10.5)
nRBC: 0 % (ref 0.0–0.2)

## 2022-01-09 LAB — D-DIMER, QUANTITATIVE: D-Dimer, Quant: 0.73 ug{FEU}/mL — ABNORMAL HIGH (ref 0.00–0.50)

## 2022-01-09 LAB — CK: Total CK: 140 U/L (ref 38–234)

## 2022-01-09 NOTE — Progress Notes (Signed)
Patient ID: Jacqueline [name] [facility], female    DOB: Mar 15, 1951, 71 y.o.   MRN: [id number]  PCP: Delsa Grana, PA-C  Chief Complaint  Patient presents with   Leg Pain    Right leg below knee mainly radiates around calf onset for 2-3 days, pt has taken gabapentin to relieve pain    Subjective:   Jacqueline [name] is a 71 y.o. female, presents to clinic with CC of the following:  HPI  Hx of neuropathy, previously well controlled on gabapentin, consult with neurology Also hx of B12 deficiency Here with worse than normal right leg pain from knee to calf, worse for 2-3 days She went to the beach over the last weekend, no injury, she had gradual on set of pain over the last day or so at the beach, she drove home Sunday (3 h drive) it got much worse Monday morning, seems worse after periods of immobility and worse with weight bearing Located around her right knee, below it and feeling like it wraps around the outside of her right lower leg No bruising, redness Some mild bilateral LE edema Yesterday she took about double the amount of gabapentin medicine and slept well and her pain was slightly better this morning but then throughout the day has severely worsened, it is so bad that she was unable to drive herself here today and she asked her daughter to bring her She had a fever yesterday 105 just felt achy and ill yesterday celebrating 4th of July, she took some ibuprofen and this morning has not had a fever, she has not been around anyone sick that she knows of, she has not taken a COVID test   Patient Active Problem List   Diagnosis Date Noted   Chronic diastolic heart failure ([place]) 10/12/2021   Paroxysmal tachycardia ([place]) 10/12/2021   Vitamin D deficiency 10/12/2021   DM (diabetes mellitus), type 2 ([place]) 10/12/2021   Peripheral edema 12/28/2019   Neuropathy 11/23/2019   B12 deficiency 09/23/2019   Stage 3a chronic kidney disease ([place]) 09/23/2019   History of kidney stones 10/14/2018    Insomnia 10/14/2018   Spinal stenosis of lumbar region 08/11/2009   Essential hypertension 01/14/2008   Class 2 severe obesity with serious comorbidity and body mass index (BMI) of 35.0 to 35.9 in adult ([place]) 12/25/2006   HLD (hyperlipidemia) 11/11/2006   Osteopenia 11/11/2006      Current Outpatient Medications:    aspirin 81 MG chewable tablet, Chew 81 mg by mouth daily., Disp: , Rfl:    blood glucose meter kit and supplies KIT, Lifescan meter Check blood sugar 2 times daily as needed, Disp: 1 each, Rfl: 0   calcium carbonate (OS-CAL) 600 MG TABS tablet, Take 600 mg by mouth 2 (two) times daily with a meal., Disp: 30 tablet, Rfl: 0   Cholecalciferol (VITAMIN D) 50 MCG (2000 UT) CAPS, Take 1 capsule by mouth daily., Disp: 30 capsule, Rfl:    ezetimibe (ZETIA) 10 MG tablet, Take 1 tablet by mouth once daily, Disp: 90 tablet, Rfl: 0   gabapentin (NEURONTIN) 300 MG capsule, Take 1 tablet in the AM and 2 tablets in the evening, Disp: , Rfl:    Lancets (ONETOUCH DELICA PLUS [id number]) MISC, USE AS DIRECTED TWICE A DAY AS NEEDED, Disp: 200 each, Rfl: 0   lisinopril (ZESTRIL) 10 MG tablet, TAKE ONE TABLET BY MOUTH ONCE DAILY, Disp: 90 tablet, Rfl: 1   Magnesium Oxide 500 MG CAPS, Take 1 capsule by mouth daily.,  Disp: , Rfl:    metoprolol tartrate (LOPRESSOR) 25 MG tablet, TAKE ONE TABLET BY MOUTH TWICE DAILY, Disp: 180 tablet, Rfl: 3   ONETOUCH ULTRA test strip, USE AS DIRECTED TWICE A DAY AS NEEDED, Disp: 200 strip, Rfl: 1   Potassium Gluconate 595 MG CAPS, Take 595 mg by mouth daily., Disp: , Rfl:    rosuvastatin (CRESTOR) 10 MG tablet, TAKE ONE TABLET BY MOUTH ONCE DAILY, Disp: 90 tablet, Rfl: 1   traZODone (DESYREL) 50 MG tablet, TAKE ONE TABLET BY MOUTH EVERYDAY AT BEDTIME, Disp: 90 tablet, Rfl: 1   VITAMIN E PO, Take 800 Units by mouth daily., Disp: , Rfl:    zinc gluconate 50 MG tablet, Take 50 mg by mouth daily., Disp: , Rfl:    No Known Allergies   Social History   Tobacco Use    Smoking status: Former    Packs/day: 0.25    Years: 30.00    Total pack years: 7.50    Types: Cigarettes    Start date: 07/09/1983    Quit date: 05/27/2014    Years since quitting: 7.6   Smokeless tobacco: Never  Vaping Use   Vaping Use: Never used  Substance Use Topics   Alcohol use: Yes    Alcohol/week: 4.0 standard drinks of alcohol    Types: 4 Glasses of wine per week    Comment: occasional   Drug use: No      Chart Review Today: I personally reviewed active problem list, medication list, allergies, family history, social history, health maintenance, notes from last encounter, lab results, imaging with the patient/caregiver today.   Review of Systems  Constitutional: Negative.   HENT: Negative.    Eyes: Negative.   Respiratory: Negative.    Cardiovascular: Negative.   Gastrointestinal: Negative.   Endocrine: Negative.   Genitourinary: Negative.   Musculoskeletal: Negative.   Skin: Negative.   Allergic/Immunologic: Negative.   Neurological: Negative.   Hematological: Negative.   Psychiatric/Behavioral: Negative.    All other systems reviewed and are negative.      Objective:   Vitals:   01/09/22 1326  BP: 132/70  Pulse: 74  Resp: 16  Temp: 97.7 F (36.5 C)  TempSrc: Oral  SpO2: 94%  Weight: 227 lb 6.4 oz (103.1 kg)  Height: _0  (1.626 m)    Body mass index is 39.03 kg/m.  Physical Exam Vitals and nursing note reviewed.  Constitutional:      General: She is not in acute distress.    Appearance: Normal appearance. She is well-developed. She is obese. She is not ill-appearing, toxic-appearing or diaphoretic.     Interventions: Face mask in place.  HENT:     Head: Normocephalic and atraumatic.     Right Ear: External ear normal.     Left Ear: External ear normal.  Eyes:     General: Lids are normal. No scleral icterus.       Right eye: No discharge.        Left eye: No discharge.     Conjunctiva/sclera: Conjunctivae normal.  Neck:      Trachea: Phonation normal. No tracheal deviation.  Cardiovascular:     Rate and Rhythm: Normal rate and regular rhythm.     Pulses: Normal pulses.          Radial pulses are 2+ on the right side and 2+ on the left side.       Dorsalis pedis pulses are 2+ on the right side and 2+ on  the left side.       Posterior tibial pulses are 2+ on the right side and 2+ on the left side.     Heart sounds: Normal heart sounds.  Pulmonary:     Effort: Pulmonary effort is normal.     Breath sounds: Normal breath sounds.  Abdominal:     General: Bowel sounds are normal. There is no distension.     Palpations: Abdomen is soft.  Musculoskeletal:     Right knee: Normal. No swelling, deformity, effusion or erythema.     Left knee: Normal. No swelling, deformity, effusion or erythema.     Right lower leg: No swelling, deformity, lacerations, tenderness or bony tenderness. Edema (non-pitting bilateral edema ankle to pretibial) present.     Left lower leg: No swelling, deformity, lacerations, tenderness or bony tenderness. Edema present.     Comments: Right leg, no visible bruising or erythema Muscle compartments soft No tenderness to palpation No popliteal fossa tenderness, no palpable cord No visible lower extremity edema asymmetry  Feet:     Right foot:     Skin integrity: No ulcer, blister or skin breakdown.     Toenail Condition: Right toenails are normal.     Left foot:     Skin integrity: No ulcer, blister or skin breakdown.     Toenail Condition: Left toenails are normal.     Comments: Normal capillary refill b/l Skin:    General: Skin is warm and dry.     Coloration: Skin is not jaundiced or pale.     Findings: No rash.  Neurological:     Mental Status: She is alert.     Motor: No abnormal muscle tone.     Gait: Gait abnormal (antalgic gait).  Psychiatric:        Mood and Affect: Mood normal.        Speech: Speech normal.        Behavior: Behavior normal.      Results for orders  placed or performed in visit on 12/19/21  HM DIABETES EYE EXAM  Result Value Ref Range   HM Diabetic Eye Exam No Retinopathy No Retinopathy       Assessment & Plan:     ICD-10-CM   1. Myalgia  M79.10 CBC with Differential/Platelet    CK (Creatine Kinase)    DG Tibia/Fibula Right    DG Knee Complete 4 Views Right    US Venous Img Lower Unilateral Right   no tenderness on exam, muscle compartments soft    2. Right leg pain  M79.604 Comprehensive metabolic panel    D-Dimer, Quantitative    DG Tibia/Fibula Right    DG Knee Complete 4 Views Right    US Venous Img Lower Unilateral Right   right leg, below knee and to outer anterior leg muscles, not reproducible on exam, normal knee exam, good pulses, no ttp, worse with weight bearing    3. Neuropathy  G62.9 CBC with Differential/Platelet    Comprehensive metabolic panel   hx of neuropathy, this pain is achy, sore, worse after immobility and worse with weight bearing, better in am after resting, no bony or msk ttp    4. Positive D-dimer  R79.89 US Venous Img Lower Unilateral Right   if neg r/o DVT, if positive proceed with [place]    5. Controlled type 2 diabetes mellitus without complication, without long-term current use of insulin (HCC) Chronic E11.9    has been well controlled    6.  Class 2 severe obesity with serious comorbidity and body mass index (BMI) of 39.0 to 39.9 in adult, unspecified obesity type (HCC)  E66.01    Z68.39    associated with DM, HTN, HLD, heart failure          Delsa Grana, PA-C 01/09/22 1:34 PM

## 2022-01-14 ENCOUNTER — Ambulatory Visit (INDEPENDENT_AMBULATORY_CARE_PROVIDER_SITE_OTHER): Payer: Medicare HMO

## 2022-01-14 DIAGNOSIS — E538 Deficiency of other specified B group vitamins: Secondary | ICD-10-CM | POA: Diagnosis not present

## 2022-01-14 MED ORDER — CYANOCOBALAMIN 1000 MCG/ML IJ SOLN
1000.0000 ug | Freq: Once | INTRAMUSCULAR | Status: AC
  Administered 2022-01-14: 1000 ug via INTRAMUSCULAR

## 2022-01-30 ENCOUNTER — Ambulatory Visit: Payer: Medicare HMO

## 2022-01-30 DIAGNOSIS — I10 Essential (primary) hypertension: Secondary | ICD-10-CM

## 2022-01-30 DIAGNOSIS — E1142 Type 2 diabetes mellitus with diabetic polyneuropathy: Secondary | ICD-10-CM

## 2022-01-30 NOTE — Progress Notes (Signed)
Chronic Care Management Pharmacy Note  01/30/2022 Name:  Pallas Wahlert [facility] MRN:  [id number] DOB:  August 12, 1950  Summary: Patient presents for CCM follow-up.    Recommendations/Changes made from today's visit: Continue current medications   Plan: CPP 6 month follow-up   Subjective: Nysha Koplin is an 71 y.o. year old female who is a primary patient of Delsa Grana, [place].  The CCM team was consulted for assistance with disease management and care coordination needs.    Engaged with patient by telephone for follow up visit in response to provider referral for pharmacy case management and/or care coordination services.   Consent to Services:  The patient was given information about Chronic Care Management services, agreed to services, and gave verbal consent prior to initiation of services.  Please see initial visit note for detailed documentation.   Patient Care Team: Delsa Grana, PA-C as PCP - General (Family Medicine) Germaine Pomfret, [facility] as Pharmacist (Pharmacist) Minna Merritts, MD as Consulting Physician (Cardiology)  Recent office visits: 01/09/22: Patient presented to Delsa Grana, PA-C for follow-up. No medication changes noted.  4/323: Patient presented to Delsa Grana, PA-C for follow-up. No medication changes noted.   Recent consult visits: 01/07/22: Patient presented to Gerrie Nordmann, NP (Cardiology).  12/13/2021 Rufina Falco, PA- (Neurology) for Leg Cramping- Provider advised patient could increase Gabapentin from 300 mg to 600 mg nightly for leg cramping, No orders placed, patient to follow-up in 1 year  Hospital visits: None in previous 6 months   Objective:  Lab Results  Component Value Date   CREATININE 0.82 01/09/2022   BUN 15 01/09/2022   GFR 45.58 (L) 04/30/2019   GFRNONAA >60 01/09/2022   GFRAA 83 07/03/2020   NA 140 01/09/2022   K 4.1 01/09/2022   CALCIUM 9.0 01/09/2022   CO2 27 01/09/2022   GLUCOSE 116 (H) 01/09/2022     Lab Results  Component Value Date/Time   HGBA1C 6.5 (H) 10/08/2021 11:46 AM   HGBA1C 6.1 (H) 03/02/2021 10:06 AM   GFR 45.58 (L) 04/30/2019 09:03 AM   GFR 63.97 03/24/2019 03:00 PM   MICROALBUR <0.2 12/28/2019 10:30 AM   MICROALBUR <0.2 06/25/2019 12:00 AM    Last diabetic Eye exam:  Lab Results  Component Value Date/Time   HMDIABEYEEXA No Retinopathy 11/26/2021 12:00 AM    Last diabetic Foot exam: No results found for: "HMDIABFOOTEX"   Lab Results  Component Value Date   CHOL 126 04/12/2021   HDL 47 04/12/2021   LDLCALC 62 04/12/2021   LDLDIRECT 149.5 12/25/2006   TRIG 90 04/12/2021   CHOLHDL 2.7 04/12/2021       Latest Ref Rng & Units 01/09/2022    2:31 PM 10/08/2021   11:46 AM 03/02/2021   10:06 AM  Hepatic Function  Total Protein 6.5 - 8.1 g/dL 6.9  6.9  6.7   Albumin 3.5 - 5.0 g/dL 3.7     AST 15 - 41 U/L 18  16  16    ALT 0 - 44 U/L 18  16  16    Alk Phosphatase 38 - 126 U/L 53     Total Bilirubin 0.3 - 1.2 mg/dL 0.8  0.4  0.6     Lab Results  Component Value Date/Time   TSH 1.63 12/28/2019 10:30 AM   TSH 1.08 06/25/2019 12:00 AM       Latest Ref Rng & Units 01/09/2022    2:31 PM 10/08/2021   11:46 AM 07/03/2020   10:59 AM  CBC  WBC 4.0 - 10.5 K/uL 8.0  8.8  8.5   Hemoglobin 12.0 - 15.0 g/dL 14.0  14.9  14.3   Hematocrit 36.0 - 46.0 % 42.3  45.1  43.1   Platelets 150 - 400 K/uL 199  211  243     Lab Results  Component Value Date/Time   VD25OH 47 03/02/2021 10:06 AM   VD25OH 26.17 (L) 09/25/2017 09:16 AM    Clinical ASCVD: No  The ASCVD Risk score (Arnett DK, et al., 2019) failed to calculate for the following reasons:   The valid total cholesterol range is 130 to 320 mg/dL       01/09/2022    1:25 PM 10/08/2021   11:13 AM 10/01/2021   10:10 AM  Depression screen PHQ 2/9  Decreased Interest 0 0 0  Down, Depressed, Hopeless 0 0 0  PHQ - 2 Score 0 0 0  Altered sleeping 0 0 0  Tired, decreased energy 0 0 0  Change in appetite 0 0 0  Feeling bad  or failure about yourself  0 0 0  Trouble concentrating 0 0 0  Moving slowly or fidgety/restless 0 0 0  Suicidal thoughts 0 0 0  PHQ-9 Score 0 0 0  Difficult doing work/chores Not difficult at all Not difficult at all Not difficult at all    Social History   Tobacco Use  Smoking Status Former   Packs/day: 0.25   Years: 30.00   Total pack years: 7.50   Types: Cigarettes   Start date: 07/09/1983   Quit date: 05/27/2014   Years since quitting: 7.6  Smokeless Tobacco Never   -Last DEXA Scan: 7/38/2022  T-Score femoral neck: -1.9  T-Score total hip: -1.7  T-Score lumbar spine: -1.2  T-Score forearm radius: NA  10-year probability of major osteoporotic fracture: 16.5%  10-year probability of hip fracture: 3.4%  BP Readings from Last 3 Encounters:  01/09/22 132/70  01/07/22 110/70  10/08/21 118/74   Pulse Readings from Last 3 Encounters:  01/09/22 74  01/07/22 68  10/08/21 75   Wt Readings from Last 3 Encounters:  01/09/22 227 lb 6.4 oz (103.1 kg)  01/07/22 227 lb (103 kg)  10/08/21 227 lb 6.4 oz (103.1 kg)   BMI Readings from Last 3 Encounters:  01/09/22 39.03 kg/m  01/07/22 38.96 kg/m  10/08/21 39.03 kg/m    Assessment/Interventions: Review of patient past medical history, allergies, medications, health status, including review of consultants reports, laboratory and other test data, was performed as part of comprehensive evaluation and provision of chronic care management services.   SDOH:  (Social Determinants of Health) assessments and interventions performed: Yes     SDOH Screenings   Alcohol Screen: Low Risk  (08/14/2021)   Alcohol Screen    Last Alcohol Screening Score (AUDIT): 2  Depression (PHQ2-9): Low Risk  (01/09/2022)   Depression (PHQ2-9)    PHQ-2 Score: 0  Financial Resource Strain: Low Risk  (08/14/2021)   Overall Financial Resource Strain (CARDIA)    Difficulty of Paying Living Expenses: Not hard at all  Food Insecurity: No Food Insecurity  (08/14/2021)   Hunger Vital Sign    Worried About Running Out of Food in the Last Year: Never true    Ran Out of Food in the Last Year: Never true  Housing: Low Risk  (08/14/2021)   Housing    Last Housing Risk Score: 0  Physical Activity: Sufficiently Active (08/14/2021)   Exercise Vital Sign    Days  of Exercise per Week: 7 days    Minutes of Exercise per Session: 30 min  Social Connections: Socially Integrated (08/14/2021)   Social Connection and Isolation Panel [NHANES]    Frequency of Communication with Friends and Family: More than three times a week    Frequency of Social Gatherings with Friends and Family: More than three times a week    Attends Religious Services: More than 4 times per year    Active Member of Clubs or Organizations: Yes    Attends Archivist Meetings: More than 4 times per year    Marital Status: Married  Stress: No Stress Concern Present (08/14/2021)   [employer] [place] of Stress : Not at all  Tobacco Use: Medium Risk (01/09/2022)   Patient History    Smoking Tobacco Use: Former    Smokeless Tobacco Use: Never    Passive Exposure: Not on file  Transportation Needs: No Transportation Needs (08/14/2021)   PRAPARE - Hydrologist (Medical): No    Lack of Transportation (Non-Medical): No    CCM Care Plan  No Known Allergies  Medications Reviewed Today     Reviewed by Salomon Fick, CMA (Certified Medical Assistant) on 01/09/22 at 65  Med List Status: <None>   Medication Order Taking? Sig Documenting Provider Last Dose Status Informant  aspirin 81 MG chewable tablet [id number] Yes Chew 81 mg by mouth daily. [provider] Taking Active   blood glucose meter kit and supplies KIT [id number] Yes Lifescan meter Check blood sugar 2 times daily as needed Jearld Fenton, NP Taking Active   calcium carbonate (OS-CAL) 600 MG TABS tablet [id number]  Yes Take 600 mg by mouth 2 (two) times daily with a meal. Jearld Fenton, NP Taking Active   Cholecalciferol (VITAMIN D) 50 MCG (2000 UT) CAPS [id number] Yes Take 1 capsule by mouth daily. Jearld Fenton, NP Taking Active   ezetimibe (ZETIA) 10 MG tablet [id number] Yes Take 1 tablet by mouth once daily Gollan, Kathlene November, MD Taking Active   gabapentin (NEURONTIN) 300 MG capsule [id number] Yes Take 1 tablet in the AM and 2 tablets in the evening [provider] Taking Active   Lancets [facility] Donaciano Eva PLUS [id number]) [place] [id number] Yes USE AS DIRECTED TWICE A DAY AS NEEDED Jearld Fenton, NP Taking Active   lisinopril (ZESTRIL) 10 MG tablet [id number] Yes TAKE ONE TABLET BY MOUTH ONCE DAILY Mecum, Erin E, PA-C Taking Active   Magnesium Oxide 500 MG CAPS [id number] Yes Take 1 capsule by mouth daily. [provider] Taking Active Self  metoprolol tartrate (LOPRESSOR) 25 MG tablet [id number] Yes TAKE ONE TABLET BY MOUTH TWICE DAILY Rockey Situ Kathlene November, MD Taking Active   [facility] ULTRA test strip [id number] Yes USE AS DIRECTED TWICE A DAY AS NEEDED Mecum, Dani Gobble, PA-C Taking Active   Potassium Gluconate 595 MG CAPS [id number] Yes Take 595 mg by mouth daily. [provider] Taking Active   rosuvastatin (CRESTOR) 10 MG tablet [id number] Yes TAKE ONE TABLET BY MOUTH ONCE DAILY Mecum, Erin E, PA-C Taking Active   traZODone (DESYREL) 50 MG tablet [id number] Yes TAKE ONE TABLET BY MOUTH EVERYDAY AT BEDTIME Mecum, Dani Gobble, PA-C Taking Active   VITAMIN E PO [id number] Yes Take 800 Units by mouth daily. [provider] Taking Active   zinc gluconate 50 MG tablet [id number] Yes Take 50 mg by mouth daily. [provider]  Taking Active             Patient Active Problem List   Diagnosis Date Noted   Chronic diastolic heart failure ([place]) 10/12/2021   Paroxysmal tachycardia ([place]) 10/12/2021   Vitamin D deficiency 10/12/2021   DM (diabetes mellitus), type 2 ([place]) 10/12/2021    Peripheral edema 12/28/2019   Neuropathy 11/23/2019   B12 deficiency 09/23/2019   Stage 3a chronic kidney disease ([place]) 09/23/2019   History of kidney stones 10/14/2018   Insomnia 10/14/2018   Spinal stenosis of lumbar region 08/11/2009   Essential hypertension 01/14/2008   Class 2 severe obesity with serious comorbidity and body mass index (BMI) of 39.0 to 39.9 in adult ([place]) 12/25/2006   HLD (hyperlipidemia) 11/11/2006   Osteopenia 11/11/2006    Immunization History  Administered Date(s) Administered   Fluad Quad(high Dose 65+) 07/03/2020   Influenza,inj,Quad PF,6+ Mos 07/30/2018   Influenza-Unspecified 07/30/2018   PFIZER(Purple Top)SARS-COV-2 Vaccination 03/23/2020, 04/13/2020   PNEUMOCOCCAL CONJUGATE-20 10/08/2021   Pneumococcal Polysaccharide-23 07/03/2020   Td 12/27/1997, 11/27/2009   Zoster, Live 10/27/2013    Conditions to be addressed/monitored:  Hypertension, Hyperlipidemia, Diabetes, Chronic Kidney Disease, and Osteopenia  Care Plan : General Pharmacy (Adult)  Updates made by Germaine Pomfret, [place] since 01/30/2022 12:00 AM     Problem: Hypertension, Hyperlipidemia, Diabetes, Chronic Kidney Disease, and Osteopenia   Priority: High     Long-Range Goal: Patient-Specific Goal   Start Date: 12/22/2020  Expected End Date: 01/31/2023  This Visit's Progress: On track  Recent Progress: On track  Priority: High  Note:   Current Barriers:  No barriers noted  Pharmacist Clinical Goal(s):  Patient will maintain control of blood pressure as evidenced by BP less than 140/90  through collaboration with PharmD and provider.   Interventions: 1:1 collaboration with Delsa Grana, PA-C regarding development and update of comprehensive plan of care as evidenced by provider attestation and co-signature Inter-disciplinary care team collaboration (see longitudinal plan of care) Comprehensive medication review performed; medication list updated in electronic medical  record  Hypertension (BP goal <130/80) -Controlled -Current treatment: Lisinopril 10 mg daily  Metoprolol Tartrate 25 mg twice daily  -Medications previously tried: NA  -Current home readings: Typically 120/70s.  -Denies hypotensive/hypertensive symptoms -Educated on Importance of home blood pressure monitoring; -Counseled to monitor BP at home weekly, document, and provide log at future appointments -Recommended to continue current medication  Hyperlipidemia: (LDL goal < 100) -Controlled -Current treatment: Ezetimibe 10 mg daily  Rosuvastatin 10 mg daily  -Medications previously tried: NA  -Ezetimbe filled [facility] using [facility].  -Educated on Cholesterol goals;  -Recommended to continue current medication  Diabetes (A1c goal <6.5%) -Controlled -Complications: Neuropathy  -Current medications: None -Medications previously tried: NA  -Current home glucose readings fasting glucose: NA -Educated on Carbohydrate counting and/or plate method -Walking several times daily. Watching carbohydrate intake. Struggles with breakfast -Recommended to continue current medication  Osteopenia (Goal Prevent Bone fractures) -Controlled -Patient is a candidate for pharmacologic treatment given 10-year hip fracture risk > 3%.  -Current treatment  Calcium Carbonate 600 mg twice daily  Vitamin D 2000 units daily  -Medications previously tried: NA  -Patient hesitant to start pharmacologic therapy at this time. -Recommended to continue current medication  Patient Goals/Self-Care Activities Patient will:  - focus on medication adherence by utilizing [place] check blood pressure weekly, document, and provide at future appointments  Follow Up Plan: No further follow up required: Patient will reach out to clinical team as needed.  Medication Assistance: None required.  Patient affirms current coverage meets needs.  Compliance/Adherence/Medication fill  history: Care Gaps: Shingrix, Tdap, Covid Vaccine  Star-Rating Drugs: Rosuvastatin 10 mg: LF 01/11/2022  for a 30 day supply at [facility] Lisinopril 10 mg:  LF 01/11/2022  for a 30 day supply at [place]  Patient's preferred pharmacy is:  [facility] - [place], [place] - [address] [place] [address] Phone: [phone] Fax: [phone]  [id number] [address] [address] [place]Phone: [phone]: [phone] [place] [place] [address]Phone: [phone]: [phone]  Uses pill box? Yes Pt endorses 100% compliance  Patient decided to: Utilize [facility] for medication synchronization, packaging and delivery  Care Plan and Follow Up Patient Decision:  Patient agrees to Care Plan and Follow-up.  Plan: No further follow up required: Patient will reach out to clinical team as needed.  AMalva Limes CMalonePharmacist Practitioner  [phone]

## 2022-01-30 NOTE — Patient Instructions (Signed)
Visit Information It was great speaking with you today!  Please let me know if you have any questions about our visit.   Goals Addressed             This Visit's Progress    Track and Manage My Blood Pressure-Hypertension   On track    Timeframe:  Long-Range Goal Priority:  High Start Date: 12/21/2020                            Expected End Date: 06/22/2022                      Follow Up within 90 days   - check blood pressure weekly    Why is this important?   You won't feel high blood pressure, but it can still hurt your blood vessels.  High blood pressure can cause heart or kidney problems. It can also cause a stroke.  Making lifestyle changes like losing a little weight or eating less salt will help.  Checking your blood pressure at home and at different times of the day can help to control blood pressure.  If the doctor prescribes medicine remember to take it the way the doctor ordered.  Call the office if you cannot afford the medicine or if there are questions about it.     Notes:         Patient Care Plan: General Pharmacy (Adult)     Problem Identified: Hypertension, Hyperlipidemia, Diabetes, Chronic Kidney Disease, and Osteopenia   Priority: High     Long-Range Goal: Patient-Specific Goal   Start Date: 12/22/2020  Expected End Date: 01/31/2023  This Visit's Progress: On track  Recent Progress: On track  Priority: High  Note:   Current Barriers:  No barriers noted  Pharmacist Clinical Goal(s):  Patient will maintain control of blood pressure as evidenced by BP less than 140/90  through collaboration with PharmD and provider.   Interventions: 1:1 collaboration with Delsa Grana, PA-C regarding development and update of comprehensive plan of care as evidenced by provider attestation and co-signature Inter-disciplinary care team collaboration (see longitudinal plan of care) Comprehensive medication review performed; medication list updated in electronic  medical record  Hypertension (BP goal <130/80) -Controlled -Current treatment: Lisinopril 10 mg daily  Metoprolol Tartrate 25 mg twice daily  -Medications previously tried: NA  -Current home readings: Typically 120/70s.  -Denies hypotensive/hypertensive symptoms -Educated on Importance of home blood pressure monitoring; -Counseled to monitor BP at home weekly, document, and provide log at future appointments -Recommended to continue current medication  Hyperlipidemia: (LDL goal < 100) -Controlled -Current treatment: Ezetimibe 10 mg daily  Rosuvastatin 10 mg daily  -Medications previously tried: NA  -[facility]imbe [facility] using [facility].  -Educated on Cholesterol goals;  -Recommended to continue current medication  Diabetes (A1c goal <6.5%) -Controlled -Complications: Neuropathy  -Current medications: None -Medications previously tried: NA  -Current home glucose readings fasting glucose: NA -Educated on Carbohydrate counting and/or plate method -Walking several times daily. Watching carbohydrate intake. Struggles with breakfast -Recommended to continue current medication  Osteopenia (Goal Prevent Bone fractures) -Controlled -Patient is a candidate for pharmacologic treatment given 10-year hip fracture risk > 3%.  -Current treatment  Calcium Carbonate 600 mg twice daily  Vitamin D 2000 units daily  -Medications previously tried: NA  -Patient hesitant to start pharmacologic therapy at this time. -Recommended to continue current medication  Patient Goals/Self-Care Activities Patient will:  -  focus on medication adherence by utilizing [facility] check blood pressure weekly, document, and provide at future appointments  Follow Up Plan: No further follow up required: Patient will reach out to clinical team as needed.      Patient agreed to services and verbal consent obtained.   Patient verbalizes understanding of instructions and care plan  provided today and agrees to view in [place]. Active MyChart status and patient understanding of how to access instructions and care plan via MyChart confirmed with patient.     Malva Limes, [place] Pharmacist Practitioner  [facility] [phone]

## 2022-02-08 ENCOUNTER — Telehealth: Payer: Self-pay

## 2022-02-08 NOTE — Progress Notes (Signed)
Chronic Care Management Pharmacy Assistant   Name: Analeigh Aries [facility]  MRN: [id number] DOB: 08/20/1950  Reason for Encounter: Medication Review/Medication Coordination for [facility]   Recent office visits:  None ID  Recent consult visits:  None ID  Hospital visits:  None in previous 6 months  Medications: Outpatient Encounter Medications as of 02/08/2022  Medication Sig   aspirin 81 MG chewable tablet Chew 81 mg by mouth daily.   blood glucose meter kit and supplies KIT Lifescan meter Check blood sugar 2 times daily as needed   calcium carbonate (OS-CAL) 600 MG TABS tablet Take 600 mg by mouth 2 (two) times daily with a meal.   Cholecalciferol (VITAMIN D) 50 MCG (2000 UT) CAPS Take 1 capsule by mouth daily.   ezetimibe (ZETIA) 10 MG tablet Take 1 tablet by mouth once daily   gabapentin (NEURONTIN) 300 MG capsule Take 1 tablet in the AM and 2 tablets in the evening   Lancets (ONETOUCH DELICA PLUS [id number]) MISC USE AS DIRECTED TWICE A DAY AS NEEDED   lisinopril (ZESTRIL) 10 MG tablet TAKE ONE TABLET BY MOUTH ONCE DAILY   Magnesium Oxide 500 MG CAPS Take 1 capsule by mouth daily.   metoprolol tartrate (LOPRESSOR) 25 MG tablet TAKE ONE TABLET BY MOUTH TWICE DAILY   ONETOUCH ULTRA test strip USE AS DIRECTED TWICE A DAY AS NEEDED   Potassium Gluconate 595 MG CAPS Take 595 mg by mouth daily.   rosuvastatin (CRESTOR) 10 MG tablet TAKE ONE TABLET BY MOUTH ONCE DAILY   traZODone (DESYREL) 50 MG tablet TAKE ONE TABLET BY MOUTH EVERYDAY AT BEDTIME   VITAMIN E PO Take 800 Units by mouth daily.   zinc gluconate 50 MG tablet Take 50 mg by mouth daily.   No facility-administered encounter medications on file as of 02/08/2022.   Care Gaps: COVID-19 Vaccine Booster 3 Diabetic Foot Exam Zoster Vaccine Mammogram  Influenza Vaccine  Star Rating Drugs: Lisinopril 10 mg last filled on 12/17/2021 for a 30-Day supply with [facility] Rosuvastatin 10 mg  last filled on  12/17/2021 for a 30-Day supply with [facility]  BP Readings from Last 3 Encounters:  01/09/22 132/70  01/07/22 110/70  10/08/21 118/74    Lab Results  Component Value Date   HGBA1C 6.5 (H) 10/08/2021   Patient obtains medications through Adherence Packaging  30 Days   Last adherence delivery included: Lisinopril 10 mg 1 tablet daily with (Breakfast) Metoprolol Tartrate 25 mg 1 tablet twice daily with (Breakfast and Evening meals) Trazodone 50 mg 1 tablet at (Bedtime) Rosuvastatin 10 mg 1 tablet daily with (Breakfast)  Patient declined medications for the month of July: Ezetimibe- patient fills this at [facility] as it is cheaper for her than at [facility] Gabapentin 300 mg 1 tablet three times daily at [employer], Lunch, and Evening meals)  Patient is due for next adherence delivery on: 02/19/2022 1st Route.  Called patient and reviewed medications and coordinated delivery.  This delivery to include: Lisinopril 10 mg 1 tablet daily with (Breakfast) Metoprolol Tartrate 25 mg 1 tablet twice daily with (Breakfast and Evening meals) Trazodone 50 mg 1 tablet at (Bedtime) Rosuvastatin 10 mg 1 tablet daily with (Breakfast)  Patient declined the following medications for the month of August: Ezetimibe- patient fills this at [facility] as it is cheaper for her than at [facility] Gabapentin 300 mg 1 tablet three times daily at [employer], Lunch, and Evening meals)  Patient needs refills for: No refills needed for this delivery  Confirmed delivery date of 02/19/2022 1st Route, advised patient that pharmacy will contact them the morning of delivery.  Patient is now a [facility] patient and will not follow-up with [facility] unless needed. I will continue to reach out to her monthly for her Medication Coordination for [facility].  Lynann Bologna, [place] Pharmacist Assistant Phone: [phone]

## 2022-02-09 ENCOUNTER — Other Ambulatory Visit: Payer: Self-pay | Admitting: Cardiovascular Disease

## 2022-02-09 DIAGNOSIS — E785 Hyperlipidemia, unspecified: Secondary | ICD-10-CM

## 2022-02-09 DIAGNOSIS — I700 Atherosclerosis of aorta: Secondary | ICD-10-CM

## 2022-02-11 ENCOUNTER — Ambulatory Visit (INDEPENDENT_AMBULATORY_CARE_PROVIDER_SITE_OTHER): Payer: Medicare HMO

## 2022-02-11 DIAGNOSIS — E538 Deficiency of other specified B group vitamins: Secondary | ICD-10-CM | POA: Diagnosis not present

## 2022-02-11 MED ORDER — CYANOCOBALAMIN 1000 MCG/ML IJ SOLN
1000.0000 ug | Freq: Once | INTRAMUSCULAR | Status: AC
  Administered 2022-02-11: 1000 ug via INTRAMUSCULAR

## 2022-03-05 ENCOUNTER — Other Ambulatory Visit: Payer: Self-pay | Admitting: Family Medicine

## 2022-03-05 DIAGNOSIS — Z1231 Encounter for screening mammogram for malignant neoplasm of breast: Secondary | ICD-10-CM

## 2022-03-07 ENCOUNTER — Telehealth: Payer: Self-pay

## 2022-03-07 NOTE — Progress Notes (Signed)
Chronic Care Management Pharmacy Assistant   Name: Jacqueline [name] [facility]  MRN: [id number] DOB: Sep 29, 1950  Reason for Encounter: Medication Review/Medication Coordination for [facility]  Recent office visits:  None ID  Recent consult visits:  None ID  Hospital visits:  None in previous 6 months  Medications: Outpatient Encounter Medications as of 03/07/2022  Medication Sig   aspirin 81 MG chewable tablet Chew 81 mg by mouth daily.   blood glucose meter kit and supplies KIT Lifescan meter Check blood sugar 2 times daily as needed   calcium carbonate (OS-CAL) 600 MG TABS tablet Take 600 mg by mouth 2 (two) times daily with a meal.   Cholecalciferol (VITAMIN D) 50 MCG (2000 UT) CAPS Take 1 capsule by mouth daily.   ezetimibe (ZETIA) 10 MG tablet Take 1 tablet by mouth once daily   gabapentin (NEURONTIN) 300 MG capsule Take 1 tablet in the AM and 2 tablets in the evening   Lancets (ONETOUCH DELICA PLUS [id number]) MISC USE AS DIRECTED TWICE A DAY AS NEEDED   lisinopril (ZESTRIL) 10 MG tablet TAKE ONE TABLET BY MOUTH ONCE DAILY   Magnesium Oxide 500 MG CAPS Take 1 capsule by mouth daily.   metoprolol tartrate (LOPRESSOR) 25 MG tablet TAKE ONE TABLET BY MOUTH TWICE DAILY   ONETOUCH ULTRA test strip USE AS DIRECTED TWICE A DAY AS NEEDED   Potassium Gluconate 595 MG CAPS Take 595 mg by mouth daily.   rosuvastatin (CRESTOR) 10 MG tablet TAKE ONE TABLET BY MOUTH ONCE DAILY   traZODone (DESYREL) 50 MG tablet TAKE ONE TABLET BY MOUTH EVERYDAY AT BEDTIME   VITAMIN E PO Take 800 Units by mouth daily.   zinc gluconate 50 MG tablet Take 50 mg by mouth daily.   No facility-administered encounter medications on file as of 03/07/2022.   Care Gaps: COVID-19 Vaccine Booster 3 Diabetic Foot Exam Zoster Vaccine Mammogram  Influenza Vaccine  Star Rating Drugs: Lisinopril 10 mg last filled on 02/19/2022 for a 30-Day supply with [facility] Rosuvastatin 10 mg  last filled on  02/19/2022 for a 30-Day supply with [facility]  BP Readings from Last 3 Encounters:  01/09/22 132/70  01/07/22 110/70  10/08/21 118/74    Lab Results  Component Value Date   HGBA1C 6.5 (H) 10/08/2021    Patient obtains medications through Adherence Packaging  30 Days   Last adherence delivery included:  Lisinopril 10 mg 1 tablet daily with (Breakfast) Metoprolol Tartrate 25 mg 1 tablet twice daily with (Breakfast and Evening meals) Trazodone 50 mg 1 tablet at (Bedtime) Rosuvastatin 10 mg 1 tablet daily with (Breakfast)  Patient declined the following medications for the month of August: Ezetimibe- patient fills this at [facility] as it is cheaper for her than at [facility] Gabapentin 300 mg 1 tablet three times daily at [employer], Lunch, and Evening meals)  Patient is due for next adherence delivery on: 03/20/2022 (Wednesday) 2nd Route.  Called patient and reviewed medications and coordinated delivery.  This delivery to include: Lisinopril 10 mg 1 tablet daily with (Breakfast) Metoprolol Tartrate 25 mg 1 tablet twice daily with (Breakfast and Evening meals) Trazodone 50 mg 1 tablet at (Bedtime) Rosuvastatin 10 mg 1 tablet daily with (Breakfast)  Patient declined the following medications for the month of September: Ezetimibe- patient fills this at [facility] as it is cheaper for her than at [facility] Gabapentin 300 mg 1 tablet three times daily at [employer], Lunch, and Evening meals)  Patient needs refills for: No Refills  needed for this delivery  Confirmed delivery date of 03/20/2022 2nd Route, advised patient that pharmacy will contact them the morning of delivery.  Lynann Bologna, CPA/CMA Clinical Pharmacist Assistant Phone: [phone]

## 2022-03-12 ENCOUNTER — Ambulatory Visit (INDEPENDENT_AMBULATORY_CARE_PROVIDER_SITE_OTHER): Payer: Medicare HMO

## 2022-03-12 ENCOUNTER — Encounter: Payer: Self-pay | Admitting: Family Medicine

## 2022-03-12 DIAGNOSIS — E538 Deficiency of other specified B group vitamins: Secondary | ICD-10-CM | POA: Diagnosis not present

## 2022-03-12 MED ORDER — CYANOCOBALAMIN 1000 MCG/ML IJ SOLN
1000.0000 ug | Freq: Once | INTRAMUSCULAR | Status: AC
  Administered 2022-03-12: 1000 ug via INTRAMUSCULAR

## 2022-03-12 NOTE — Progress Notes (Signed)
Patient tolerated injection well with no immediate side effects or adverse response noted. Patient aware of possibilities and will seek emergency care or contact us if any arise.

## 2022-03-14 ENCOUNTER — Telehealth (INDEPENDENT_AMBULATORY_CARE_PROVIDER_SITE_OTHER): Payer: Medicare HMO | Admitting: Family Medicine

## 2022-03-14 ENCOUNTER — Encounter: Payer: Self-pay | Admitting: Family Medicine

## 2022-03-14 DIAGNOSIS — E1142 Type 2 diabetes mellitus with diabetic polyneuropathy: Secondary | ICD-10-CM | POA: Diagnosis not present

## 2022-03-14 MED ORDER — FREESTYLE LIBRE 3 SENSOR MISC: 1.0000 | 0 refills | Status: DC

## 2022-03-14 NOTE — Assessment & Plan Note (Signed)
Pt with dx of T2DM onset in 2020 overall well controlled with diet/lifestyle She presents inquiring about ozempic due to higher fasting blood sugars - 130's She has not been on meds previously (metformin not attempted) Discussed GLP-1 meds, she has no contraindications Encourage her to call insurance to ask about drug coverage A freestyle libre 3 sample was left her her in front office Encouraged her to monitor sugars and foods that may be causing high sugars, reviewed blood sugar and A1C goals She has a f/up appt soon where we will do f/up A1C, she is due for DM foot exam, she is on statin and ACEI Reassured pt she has hx of well controlled DM and for most of the last couple years has been in prediabetic range

## 2022-03-14 NOTE — Progress Notes (Signed)
Name: Jacqueline [name] [facility]   MRN: [id number]    DOB: 1951/04/17   Date:03/14/2022       Progress Note  Subjective:    Chief Complaint  Chief Complaint  Patient presents with   Advice Only    Discuss about ozempic, no vital signs taken    I connected with  Tayvia Faughnan [facility]  on 03/14/22 at  3:40 PM EDT by a video enabled telemedicine application and verified that I am speaking with the correct person using two identifiers.  I discussed the limitations of evaluation and management by telemedicine and the availability of in person appointments. The patient expressed understanding and agreed to proceed. Staff also discussed with the patient that there may be a patient responsible charge related to this service. Patient Location: home Provider Location: [facility] clinic office Additional Individuals present: none  HPI Pt has DM, well controlled currently with diet/lifestyle, initially dx in 2020, but A1C did go down to prediabetic range for a while. She had some higher fasting glucose readings that has made her anxious - 130's She inquires about ozempic med She denies hx of pancreatitis, thyroid cancer, MEN type 2  Recent pertinent labs:  Lab Results  Component Value Date   HGBA1C 6.5 (H) 10/08/2021   HGBA1C 6.1 (H) 03/02/2021   HGBA1C 6.2 (H) 07/03/2020   HGBA1C 5.7 (H) 09/23/2019   HGBA1C 5.8 (H) 06/25/2019   HGBA1C 6.6 (H) 03/26/2019      Patient Active Problem List   Diagnosis Date Noted   Chronic diastolic heart failure ([place]) 10/12/2021   Paroxysmal tachycardia ([place]) 10/12/2021   Vitamin D deficiency 10/12/2021   DM (diabetes mellitus), type 2 ([place]) 10/12/2021   Peripheral edema 12/28/2019   Neuropathy 11/23/2019   B12 deficiency 09/23/2019   Stage 3a chronic kidney disease ([place]) 09/23/2019   History of kidney stones 10/14/2018   Insomnia 10/14/2018   Spinal stenosis of lumbar region 08/11/2009   Essential hypertension 01/14/2008   Class 2 severe obesity with serious  comorbidity and body mass index (BMI) of 39.0 to 39.9 in adult ([place]) 12/25/2006   HLD (hyperlipidemia) 11/11/2006   Osteopenia 11/11/2006    Social History   Tobacco Use   Smoking status: Former    Packs/day: 0.25    Years: 30.00    Total pack years: 7.50    Types: Cigarettes    Start date: 07/09/1983    Quit date: 05/27/2014    Years since quitting: 7.8   Smokeless tobacco: Never  Substance Use Topics   Alcohol use: Yes    Alcohol/week: 4.0 standard drinks of alcohol    Types: 4 Glasses of wine per week    Comment: occasional     Current Outpatient Medications:    aspirin 81 MG chewable tablet, Chew 81 mg by mouth daily., Disp: , Rfl:    blood glucose meter kit and supplies KIT, Lifescan meter Check blood sugar 2 times daily as needed, Disp: 1 each, Rfl: 0   calcium carbonate (OS-CAL) 600 MG TABS tablet, Take 600 mg by mouth 2 (two) times daily with a meal., Disp: 30 tablet, Rfl: 0   Cholecalciferol (VITAMIN D) 50 MCG (2000 UT) CAPS, Take 1 capsule by mouth daily., Disp: 30 capsule, Rfl:    ezetimibe (ZETIA) 10 MG tablet, Take 1 tablet by mouth once daily, Disp: 90 tablet, Rfl: 0   gabapentin (NEURONTIN) 300 MG capsule, Take 1 tablet in the AM and 2 tablets in the evening, Disp: , Rfl:  Lancets (ONETOUCH DELICA PLUS [id number]) MISC, USE AS DIRECTED TWICE A DAY AS NEEDED, Disp: 200 each, Rfl: 0   lisinopril (ZESTRIL) 10 MG tablet, TAKE ONE TABLET BY MOUTH ONCE DAILY, Disp: 90 tablet, Rfl: 1   Magnesium Oxide 500 MG CAPS, Take 1 capsule by mouth daily., Disp: , Rfl:    metoprolol tartrate (LOPRESSOR) 25 MG tablet, TAKE ONE TABLET BY MOUTH TWICE DAILY, Disp: 180 tablet, Rfl: 3   ONETOUCH ULTRA test strip, USE AS DIRECTED TWICE A DAY AS NEEDED, Disp: 200 strip, Rfl: 1   Potassium Gluconate 595 MG CAPS, Take 595 mg by mouth daily., Disp: , Rfl:    rosuvastatin (CRESTOR) 10 MG tablet, TAKE ONE TABLET BY MOUTH ONCE DAILY, Disp: 90 tablet, Rfl: 1   traZODone (DESYREL) 50 MG tablet,  TAKE ONE TABLET BY MOUTH EVERYDAY AT BEDTIME, Disp: 90 tablet, Rfl: 1   VITAMIN E PO, Take 800 Units by mouth daily., Disp: , Rfl:    zinc gluconate 50 MG tablet, Take 50 mg by mouth daily., Disp: , Rfl:   No Known Allergies  I personally reviewed active problem list, medication list, allergies, family history, social history, health maintenance, notes from last encounter, lab results, imaging with the patient/caregiver today.   Review of Systems  Constitutional: Negative.   HENT: Negative.    Eyes: Negative.   Respiratory: Negative.    Cardiovascular: Negative.   Gastrointestinal: Negative.   Endocrine: Negative.   Genitourinary: Negative.   Musculoskeletal: Negative.   Skin: Negative.   Allergic/Immunologic: Negative.   Neurological: Negative.   Hematological: Negative.   Psychiatric/Behavioral: Negative.    All other systems reviewed and are negative.     Objective:   Virtual encounter, vitals limited, only able to obtain the following There were no vitals filed for this visit. There is no height or weight on file to calculate BMI. Nursing Note and Vital Signs reviewed.  Physical Exam Vitals and nursing note reviewed.  Constitutional:      Appearance: Normal appearance. She is obese.  Neurological:     Mental Status: She is alert. Mental status is at baseline.  Psychiatric:        Mood and Affect: Mood normal.     PE limited by virtual encounter  No results found for this or any previous visit (from the past 72 hour(s)).  Assessment and Plan:   Problem List Items Addressed This Visit       Endocrine   DM (diabetes mellitus), type 2 ([place]) - Primary    Pt with dx of T2DM onset in 2020 overall well controlled with diet/lifestyle She presents inquiring about ozempic due to higher fasting blood sugars - 130's She has not been on meds previously (metformin not attempted) Discussed GLP-1 meds, she has no contraindications Encourage her to call insurance to ask  about drug coverage A freestyle libre 3 sample was left her her in front office Encouraged her to monitor sugars and foods that may be causing high sugars, reviewed blood sugar and A1C goals She has a f/up appt soon where we will do f/up A1C, she is due for DM foot exam, she is on statin and ACEI Reassured pt she has hx of well controlled DM and for most of the last couple years has been in prediabetic range       Relevant Medications   Continuous Blood Gluc Sensor (FREESTYLE LIBRE 3 SENSOR) MISC    Education and info about plan given to pt through AVS -  see below   Call your insurance and ask about the pharmaceutical coverage for diabetes meds in the GLP-1 agonist - these medications include: Dulaglutide (Trulicity) (weekly) Semaglutide (Ozempic) (weekly) Liraglutide (Victoza, Saxenda) (daily) Semaglutide (Rybelsus) (taken by mouth once daily)  See what the coverage and copay or deductible are and ask if there is a prior authorization required for the meds, or a list of other medications that much be tried first before insurance will cover - let me know what you find out and if you are interested in them if covered.    Blood Glucose Monitoring, Adult Monitoring your blood sugar (glucose) is an important part of managing your diabetes. Blood glucose monitoring involves checking your blood glucose as often as directed and keeping a log or record of your results over time. Checking your blood glucose regularly and keeping a blood glucose log can: Help you and your health care provider adjust your diabetes management plan as needed, including your medicines or insulin. Help you understand how food, exercise, illnesses, and medicines affect your blood glucose. Let you know what your blood glucose is at any time. You can quickly find out if you have low blood glucose (hypoglycemia) or high blood glucose (hyperglycemia). Your health care provider will set individualized treatment goals for  you. Your goals will be based on your age, other medical conditions you have, and how you respond to diabetes treatment. Generally, the goal of treatment is to maintain the following blood glucose levels: Before meals (preprandial): 80-130 mg/dL (4.4-7.2 mmol/L).  (You may have occasionally higher readings 130-150's - which may be from foods you ate the day before and generally is okay as long as it doesn't become a daily average( After meals (postprandial) 2 hours after eating : below 180 mg/dL (10 mmol/L). A1C level: less than 7%.    - I discussed the assessment and treatment plan with the patient. The patient was provided an opportunity to ask questions and all were answered. The patient agreed with the plan and demonstrated an understanding of the instructions.  I provided 25+ minutes of non-face-to-face time during this encounter.  Delsa Grana, PA-C 03/14/22 4:03 PM

## 2022-03-14 NOTE — Patient Instructions (Signed)
Call your insurance and ask about the pharmaceutical coverage for diabetes meds in the GLP-1 agonist - these medications include: Dulaglutide (Trulicity) (weekly) Semaglutide (Ozempic) (weekly) Liraglutide (Victoza, Saxenda) (daily) Semaglutide (Rybelsus) (taken by mouth once daily)  See what the coverage and copay or deductible are and ask if there is a prior authorization required for the meds, or a list of other medications that much be tried first before insurance will cover - let me know what you find out and if you are interested in them if covered.    Blood Glucose Monitoring, Adult Monitoring your blood sugar (glucose) is an important part of managing your diabetes. Blood glucose monitoring involves checking your blood glucose as often as directed and keeping a log or record of your results over time. Checking your blood glucose regularly and keeping a blood glucose log can: Help you and your health care provider adjust your diabetes management plan as needed, including your medicines or insulin. Help you understand how food, exercise, illnesses, and medicines affect your blood glucose. Let you know what your blood glucose is at any time. You can quickly find out if you have low blood glucose (hypoglycemia) or high blood glucose (hyperglycemia). Your health care provider will set individualized treatment goals for you. Your goals will be based on your age, other medical conditions you have, and how you respond to diabetes treatment. Generally, the goal of treatment is to maintain the following blood glucose levels: Before meals (preprandial): 80-130 mg/dL (4.4-7.2 mmol/L).  (You may have occasionally higher readings 130-150's - which may be from foods you ate the day before and generally is okay as long as it doesn't become a daily average( After meals (postprandial) 2 hours after eating : below 180 mg/dL (10 mmol/L). A1C level: less than 7%. Where to find more information The American  Diabetes Association: www.diabetes.org The Association of Diabetes Care & Education Specialists: www.diabeteseducator.org Contact a health care provider if: Your blood glucose is at or above 240 mg/dL (13.3 mmol/L) for 2 days in a row. You have been sick or have had a fever for 2 days or longer, and you are not getting better. You have any of the following problems for more than 6 hours: You cannot eat or drink. You have nausea or vomiting. You have diarrhea. Get help right away if: Your blood glucose is lower than 54 mg/dL (3 mmol/L). You become confused, or you have trouble thinking clearly. You have difficulty breathing. You have moderate or large ketone levels in your urine. These symptoms may represent a serious problem that is an emergency. Do not wait to see if the symptoms will go away. Get medical help right away. Call your local emergency services (911 in the [place] Do not drive yourself to the hospital. Summary Monitoring your blood glucose is an important part of managing your diabetes. Blood glucose monitoring involves checking your blood glucose as often as directed and keeping a log or record of your results over time. Your health care provider will set individualized treatment goals for you. Your goals will be based on your age, other medical conditions you have, and how you respond to diabetes treatment. Every time you check your blood glucose, write down your result. Also, write down any notes about things that may be affecting your blood glucose, such as your diet and exercise for the day. This information is not intended to replace advice given to you by your health care provider. Make sure you discuss any questions you  have with your health care provider. Document Revised: 03/22/2020 Document Reviewed: 03/22/2020 Elsevier Patient Education  [place].

## 2022-03-28 ENCOUNTER — Other Ambulatory Visit: Payer: Self-pay | Admitting: Family Medicine

## 2022-03-28 ENCOUNTER — Ambulatory Visit
Admission: RE | Admit: 2022-03-28 | Discharge: 2022-03-28 | Disposition: A | Discharge status: Home / Self Care | Payer: Medicare HMO | Source: Ambulatory Visit | Attending: Family Medicine | Admitting: Family Medicine

## 2022-03-28 DIAGNOSIS — N63 Unspecified lump in unspecified breast: Secondary | ICD-10-CM

## 2022-03-28 DIAGNOSIS — R928 Other abnormal and inconclusive findings on diagnostic imaging of breast: Secondary | ICD-10-CM

## 2022-03-28 DIAGNOSIS — Z1231 Encounter for screening mammogram for malignant neoplasm of breast: Secondary | ICD-10-CM | POA: Insufficient documentation

## 2022-04-08 ENCOUNTER — Telehealth: Payer: Self-pay

## 2022-04-08 NOTE — Progress Notes (Signed)
Chronic Care Management Pharmacy Assistant   Name: Jacqueline [name] [facility]  MRN: [id number] DOB: [birth year]  Reason for Encounter: Medication Review/Medication Coordination for [facility]   Recent office visits:  03/14/2022 Delsa Grana, PA-C (PCP Video Visit) for Advice Only- No medication changes noted, No orders placed, No follow-up noted  Recent consult visits:  None ID  Hospital visits:  None in previous 6 months  Medications: Outpatient Encounter Medications as of 04/08/2022  Medication Sig   aspirin 81 MG chewable tablet Chew 81 mg by mouth daily.   blood glucose meter kit and supplies KIT Lifescan meter Check blood sugar 2 times daily as needed   calcium carbonate (OS-CAL) 600 MG TABS tablet Take 600 mg by mouth 2 (two) times daily with a meal.   Cholecalciferol (VITAMIN D) 50 MCG (2000 UT) CAPS Take 1 capsule by mouth daily.   Continuous Blood Gluc Sensor (FREESTYLE LIBRE 3 SENSOR) MISC 1 Device by Does not apply route every 14 (fourteen) days.   ezetimibe (ZETIA) 10 MG tablet Take 1 tablet by mouth once daily   gabapentin (NEURONTIN) 300 MG capsule Take 1 tablet in the AM and 2 tablets in the evening   Lancets (ONETOUCH DELICA PLUS [id number]) MISC USE AS DIRECTED TWICE A DAY AS NEEDED   lisinopril (ZESTRIL) 10 MG tablet TAKE ONE TABLET BY MOUTH ONCE DAILY   Magnesium Oxide 500 MG CAPS Take 1 capsule by mouth daily.   metoprolol tartrate (LOPRESSOR) 25 MG tablet TAKE ONE TABLET BY MOUTH TWICE DAILY   ONETOUCH ULTRA test strip USE AS DIRECTED TWICE A DAY AS NEEDED   Potassium Gluconate 595 MG CAPS Take 595 mg by mouth daily.   rosuvastatin (CRESTOR) 10 MG tablet TAKE ONE TABLET BY MOUTH ONCE DAILY   traZODone (DESYREL) 50 MG tablet TAKE ONE TABLET BY MOUTH EVERYDAY AT BEDTIME   VITAMIN E PO Take 800 Units by mouth daily.   zinc gluconate 50 MG tablet Take 50 mg by mouth daily.   No facility-administered encounter medications on file as of 04/08/2022.   Care  Gaps: Zoster Vaccines Diabetic Kidney Evaluation- Last completed 12/28/2019 Diabetic Foot Exam Influenza Vaccine  Star Rating Drugs: Lisinopril 10 mg last filled on 03/18/2022 for a 30-Day supply with [facility] Rosuvastatin 10 mg last filled on 03/18/2022 for a 30-Day supply with [facility]  BP Readings from Last 3 Encounters:  01/09/22 132/70  01/07/22 110/70  10/08/21 118/74    Lab Results  Component Value Date   HGBA1C 6.5 (H) 10/08/2021   Patient obtains medications through Adherence Packaging  30 Days   Last adherence delivery included:  Lisinopril 10 mg 1 tablet daily with (Breakfast) Metoprolol Tartrate 25 mg 1 tablet twice daily with (Breakfast and Evening meals) Trazodone 50 mg 1 tablet at (Bedtime) Rosuvastatin 10 mg 1 tablet daily with (Breakfast)  Patient declined medications for the month of September: Ezetimibe- patient fills this at [facility] as it is cheaper for her than at [facility] Gabapentin 300 mg 1 tablet three times daily at [employer], Lunch, and Evening meals)-patient taking this medication PRN  Patient is due for next adherence delivery on: 04/18/2022 (Thursday) 2nd Route . Called patient and reviewed medications and coordinated delivery.  This delivery to include: Lisinopril 10 mg 1 tablet daily with (Breakfast) Metoprolol Tartrate 25 mg 1 tablet twice daily with (Breakfast and Evening meals) Trazodone 50 mg 1 tablet at (Bedtime) Rosuvastatin 10 mg 1 tablet daily with (Breakfast) Gabapentin 300 mg 1 tablet three  times daily at [employer], Lunch, and Evening meals)  Patient declined the following medications for the month of October: Ezetimibe- patient fills this at [facility] as it is cheaper for her than at [facility]  Patient needs refills for: No refills are needed for this month's delivery  Confirmed delivery date of 04/18/2022 2nd Route, advised patient that pharmacy will contact them the morning of delivery.  Patient is a [facility]  patient no follow-up required with CPP unless patient has a need.  Lynann Bologna, CPA/CMA Clinical Pharmacist Assistant Phone: [phone]

## 2022-04-09 ENCOUNTER — Ambulatory Visit (INDEPENDENT_AMBULATORY_CARE_PROVIDER_SITE_OTHER): Payer: Medicare HMO

## 2022-04-09 DIAGNOSIS — E538 Deficiency of other specified B group vitamins: Secondary | ICD-10-CM

## 2022-04-09 MED ORDER — CYANOCOBALAMIN 1000 MCG/ML IJ SOLN
1000.0000 ug | Freq: Once | INTRAMUSCULAR | Status: AC
  Administered 2022-04-09: 1000 ug via INTRAMUSCULAR

## 2022-04-10 ENCOUNTER — Ambulatory Visit
Admission: RE | Admit: 2022-04-10 | Discharge: 2022-04-10 | Disposition: A | Discharge status: Home / Self Care | Payer: Medicare HMO | Source: Ambulatory Visit | Attending: Family Medicine | Admitting: Family Medicine

## 2022-04-10 DIAGNOSIS — R928 Other abnormal and inconclusive findings on diagnostic imaging of breast: Secondary | ICD-10-CM | POA: Insufficient documentation

## 2022-04-10 DIAGNOSIS — N63 Unspecified lump in unspecified breast: Secondary | ICD-10-CM | POA: Insufficient documentation

## 2022-04-10 DIAGNOSIS — R922 Inconclusive mammogram: Secondary | ICD-10-CM | POA: Diagnosis not present

## 2022-04-11 ENCOUNTER — Other Ambulatory Visit: Payer: Self-pay | Admitting: Family Medicine

## 2022-04-11 DIAGNOSIS — N63 Unspecified lump in unspecified breast: Secondary | ICD-10-CM

## 2022-04-11 DIAGNOSIS — R928 Other abnormal and inconclusive findings on diagnostic imaging of breast: Secondary | ICD-10-CM

## 2022-04-15 ENCOUNTER — Encounter: Payer: Self-pay | Admitting: Family Medicine

## 2022-04-15 ENCOUNTER — Ambulatory Visit (INDEPENDENT_AMBULATORY_CARE_PROVIDER_SITE_OTHER): Payer: Medicare HMO | Admitting: Family Medicine

## 2022-04-15 VITALS — BP 128/76 | HR 88 | Temp 98.2°F | Resp 16 | Ht 64.0 in | Wt 229.2 lb

## 2022-04-15 DIAGNOSIS — I10 Essential (primary) hypertension: Secondary | ICD-10-CM

## 2022-04-15 DIAGNOSIS — N1831 Chronic kidney disease, stage 3a: Secondary | ICD-10-CM | POA: Diagnosis not present

## 2022-04-15 DIAGNOSIS — R600 Localized edema: Secondary | ICD-10-CM

## 2022-04-15 DIAGNOSIS — E785 Hyperlipidemia, unspecified: Secondary | ICD-10-CM

## 2022-04-15 DIAGNOSIS — E538 Deficiency of other specified B group vitamins: Secondary | ICD-10-CM

## 2022-04-15 DIAGNOSIS — I479 Paroxysmal tachycardia, unspecified: Secondary | ICD-10-CM | POA: Diagnosis not present

## 2022-04-15 DIAGNOSIS — R609 Edema, unspecified: Secondary | ICD-10-CM

## 2022-04-15 DIAGNOSIS — I5032 Chronic diastolic (congestive) heart failure: Secondary | ICD-10-CM | POA: Diagnosis not present

## 2022-04-15 DIAGNOSIS — G629 Polyneuropathy, unspecified: Secondary | ICD-10-CM | POA: Diagnosis not present

## 2022-04-15 DIAGNOSIS — Z23 Encounter for immunization: Secondary | ICD-10-CM | POA: Diagnosis not present

## 2022-04-15 DIAGNOSIS — Z6839 Body mass index (BMI) 39.0-39.9, adult: Secondary | ICD-10-CM

## 2022-04-15 DIAGNOSIS — E1142 Type 2 diabetes mellitus with diabetic polyneuropathy: Secondary | ICD-10-CM

## 2022-04-15 DIAGNOSIS — E66812 Obesity, class 2: Secondary | ICD-10-CM

## 2022-04-15 MED ORDER — GABAPENTIN 300 MG PO CAPS: ORAL_CAPSULE | ORAL | 11 refills | Status: AC

## 2022-04-15 MED ORDER — SHINGRIX 50 MCG/0.5ML IM SUSR: 0.5000 mL | Freq: Once | INTRAMUSCULAR | 1 refills | Status: AC

## 2022-04-15 NOTE — Assessment & Plan Note (Signed)
Prior neuro work up Managed with gabapentin and keeping B12 adequately supplemented - we are doing B12 shots here Refilled her gabapentin, she has f/up with neuro about once a year - she may stop following with them and just have primary care manage meds

## 2022-04-15 NOTE — Assessment & Plan Note (Signed)
Per cardiology Currently on metoprolol 25 mg daily and sx are well controlled, no palpitations, and no BB SE or concerns Pulse Readings from Last 3 Encounters:  04/15/22 88  01/09/22 74  01/07/22 68

## 2022-04-15 NOTE — Assessment & Plan Note (Signed)
Has been well controlled with diet/lifestyle efforts only and no meds She got libre 3 meter due to concerns of high sugars, we reviewed her CGM today - additional 8+ min spent reviewing her readings Denies: Polyuria, polydipsia, vision changes, neuropathy, hypoglycemia Recent pertinent labs: Lab Results  Component Value Date   HGBA1C 6.5 (H) 10/08/2021   HGBA1C 6.1 (H) 03/02/2021   HGBA1C 6.2 (H) 07/03/2020   Standard of care and health maintenance: Urine Microalbumin:  Due and done today Foot exam:  Done today - normal, no concerns DM eye exam:  Done April 2023 - UTD ACEI/ARB:  Lisinopril  Statin:  crestor  I believe her A1C will still be at goal and well controlled We discussed meds like metformin and ozempic- but despite her prior interest, she would like to avoid additional medication unless they are needed to keep A1C at goal.

## 2022-04-15 NOTE — Assessment & Plan Note (Signed)
Lab Results  Component Value Date   EGFR 75 10/08/2021   EGFR 83 03/02/2021  GFR lower in the past - has recently been improved Will remove from chart but renal function will continue to be monitored with DM and HTN labs

## 2022-04-15 NOTE — Assessment & Plan Note (Signed)
Compliant with meds- crestor 10 and zetia No SE, no myalgias, fatigue or jaundice Due for annual lipids and recheck CMP Last lipids LDL was well controlled Lab Results  Component Value Date   CHOL 126 04/12/2021   HDL 47 04/12/2021   LDLCALC 62 04/12/2021   LDLDIRECT 149.5 12/25/2006   TRIG 90 04/12/2021   CHOLHDL 2.7 04/12/2021  for now continue meds and Diet and exercise efforts

## 2022-04-15 NOTE — Assessment & Plan Note (Signed)
Last B12 improved with continued monthly B12 injections Lab Results  Component Value Date   VITAMINB12 473 10/08/2021

## 2022-04-15 NOTE — Assessment & Plan Note (Signed)
Central obesity, associated comorbidities HLD, HTN, DM, neuropathy, diastolic HF Discussed diet/lifestyle efforts and med options which she may qualify for with Dx of DM Pt will work on diet

## 2022-04-15 NOTE — Progress Notes (Signed)
Name: Kelicia Youtz [facility]   MRN: [id number]    DOB: [birth year]   Date:04/15/2022       Progress Note  Chief Complaint  Patient presents with   Follow-up   Hypertension   Hyperlipidemia   Diabetes     Subjective:   Jacqueline [name] is a 71 y.o. female, presents to clinic for routine f/up  Recent visits done for leg pain/swelling and elevated sugars She was interested in ozempic for DM She was given freestyle libre meter - reviewed her CGM monitoring results today  Lab Results  Component Value Date   HGBA1C 6.5 (H) 10/08/2021  Not on meds, she never tried metformin, she improved A1C after initial dx with just diet/lifestyle efforts Average glucose on libre was 126, morning fasting sugars 100-119, and the rest of the day average 120-130's, normal glucose spikes with meals - only rarely above 200 with brisk drop in glucose within hours.   HLD on crestor and zetia- good compliance no SE or cocerns Lab Results  Component Value Date   CHOL 126 04/12/2021   HDL 47 04/12/2021   LDLCALC 62 04/12/2021   LDLDIRECT 149.5 12/25/2006   TRIG 90 04/12/2021   CHOLHDL 2.7 04/12/2021   HTN with hx of palpitations on lisinopril 10 mg and metorprolol per cardiology, BP at goal today and no palpitation sx.  BP Readings from Last 3 Encounters:  04/15/22 128/76  01/09/22 132/70  01/07/22 110/70   Chronic peripheral neuropathy- worked up and meds managed by neuro, but shes not sure why she is still going to them - they just refill her meds Vit B12 deficiency when managed does help minimize the pain, also on gabapentin - wasn't taking consistently and she's trying to take it more regularly  Lab Results  Component Value Date   VITAMINB12 473 10/08/2021  Getting injections monthly   Mammogram and additional testing positive she is schedule for biopsy   B/L ankle edema, better when she wears compression socks, worse when she doesn't or she sits for long periods of time, no orthopnea, PND,  DOE, no worsening LE edema - has been stable   Current Outpatient Medications:    aspirin 81 MG chewable tablet, Chew 81 mg by mouth daily., Disp: , Rfl:    blood glucose meter kit and supplies KIT, Lifescan meter Check blood sugar 2 times daily as needed, Disp: 1 each, Rfl: 0   calcium carbonate (OS-CAL) 600 MG TABS tablet, Take 600 mg by mouth 2 (two) times daily with a meal., Disp: 30 tablet, Rfl: 0   Cholecalciferol (VITAMIN D) 50 MCG (2000 UT) CAPS, Take 1 capsule by mouth daily., Disp: 30 capsule, Rfl:    Continuous Blood Gluc Sensor (FREESTYLE LIBRE 3 SENSOR) MISC, 1 Device by Does not apply route every 14 (fourteen) days., Disp: 1 each, Rfl: 0   ezetimibe (ZETIA) 10 MG tablet, Take 1 tablet by mouth once daily, Disp: 90 tablet, Rfl: 0   gabapentin (NEURONTIN) 300 MG capsule, Take 1 tablet in the AM and 2 tablets in the evening, Disp: , Rfl:    Lancets (ONETOUCH DELICA PLUS [id number]) MISC, USE AS DIRECTED TWICE A DAY AS NEEDED, Disp: 200 each, Rfl: 0   lisinopril (ZESTRIL) 10 MG tablet, TAKE ONE TABLET BY MOUTH ONCE DAILY, Disp: 90 tablet, Rfl: 1   Magnesium Oxide 500 MG CAPS, Take 1 capsule by mouth daily., Disp: , Rfl:    metoprolol tartrate (LOPRESSOR) 25 MG tablet, TAKE ONE TABLET BY MOUTH  TWICE DAILY, Disp: 180 tablet, Rfl: 3   ONETOUCH ULTRA test strip, USE AS DIRECTED TWICE A DAY AS NEEDED, Disp: 200 strip, Rfl: 1   Potassium Gluconate 595 MG CAPS, Take 595 mg by mouth daily., Disp: , Rfl:    rosuvastatin (CRESTOR) 10 MG tablet, TAKE ONE TABLET BY MOUTH ONCE DAILY, Disp: 90 tablet, Rfl: 1   traZODone (DESYREL) 50 MG tablet, TAKE ONE TABLET BY MOUTH EVERYDAY AT BEDTIME, Disp: 90 tablet, Rfl: 1   VITAMIN E PO, Take 800 Units by mouth daily., Disp: , Rfl:    zinc gluconate 50 MG tablet, Take 50 mg by mouth daily., Disp: , Rfl:   Patient Active Problem List   Diagnosis Date Noted   Chronic diastolic heart failure ([place]) 10/12/2021   Paroxysmal tachycardia ([place]) 10/12/2021    Vitamin D deficiency 10/12/2021   DM (diabetes mellitus), type 2 ([place]) 10/12/2021   Peripheral edema 12/28/2019   Neuropathy 11/23/2019   B12 deficiency 09/23/2019   Stage 3a chronic kidney disease ([place]) 09/23/2019   History of kidney stones 10/14/2018   Insomnia 10/14/2018   Spinal stenosis of lumbar region 08/11/2009   Essential hypertension 01/14/2008   Class 2 severe obesity with serious comorbidity and body mass index (BMI) of 39.0 to 39.9 in adult ([place]) 12/25/2006   HLD (hyperlipidemia) 11/11/2006   Osteopenia 11/11/2006    Past Surgical History:  Procedure Laterality Date   BREAST BIOPSY Right 08/19/2012   neg, Dr. Bary Castilla   extraction of renal stones     TONSILLECTOMY AND ADENOIDECTOMY     VAGINAL HYSTERECTOMY  1980   class IV, ovaries intact    Family History  Problem Relation Age of Onset   Hypertension Mother    Dementia Mother    Stroke Mother    Cancer - Other Father        Bile Duct   Heart disease Maternal Grandmother    Heart disease Maternal Grandfather    Hypertension Paternal Grandmother    Cancer Maternal Aunt        breast and lung   Stroke Paternal Grandfather     Social History   Tobacco Use   Smoking status: Former    Packs/day: 0.25    Years: 30.00    Total pack years: 7.50    Types: Cigarettes    Start date: 07/09/1983    Quit date: 05/27/2014    Years since quitting: 7.8   Smokeless tobacco: Never  Vaping Use   Vaping Use: Never used  Substance Use Topics   Alcohol use: Yes    Alcohol/week: 4.0 standard drinks of alcohol    Types: 4 Glasses of wine per week    Comment: occasional   Drug use: No     No Known Allergies  Health Maintenance  Topic Date Due   Zoster Vaccines- Shingrix (1 of 2) Never done   Diabetic kidney evaluation - Urine ACR  12/27/2020   FOOT EXAM  07/03/2021   HEMOGLOBIN A1C  04/09/2022   COVID-19 Vaccine (3 - Pfizer risk series) 05/01/2022 (Originally 05/11/2020)   COLONOSCOPY (Pts 45-55yr Insurance  coverage will need to be confirmed)  08/14/2022   OPHTHALMOLOGY EXAM  11/27/2022   Diabetic kidney evaluation - GFR measurement  01/10/2023   DEXA SCAN  02/02/2023   MAMMOGRAM  03/29/2023   Pneumonia Vaccine 70 Years old  Completed   INFLUENZA VACCINE  Completed   Hepatitis C Screening  Completed   HPV VACCINES  Aged Out  TETANUS/TDAP  Discontinued    Chart Review Today: I personally reviewed active problem list, medication list, allergies, family history, social history, health maintenance, notes from last encounter, lab results, imaging with the patient/caregiver today.   Review of Systems  Constitutional: Negative.   HENT: Negative.    Eyes: Negative.   Respiratory: Negative.    Cardiovascular: Negative.   Gastrointestinal: Negative.   Endocrine: Negative.   Genitourinary: Negative.   Musculoskeletal: Negative.   Skin: Negative.   Allergic/Immunologic: Negative.   Neurological: Negative.   Hematological: Negative.   Psychiatric/Behavioral: Negative.    All other systems reviewed and are negative.    Objective:   Vitals:   04/15/22 0831  BP: 128/76  Pulse: 88  Resp: 16  Temp: 98.2 F (36.8 C)  TempSrc: Oral  SpO2: 92%  Weight: 229 lb 3.2 oz (104 kg)  Height: _0  (1.626 m)    Body mass index is 39.34 kg/m.  Physical Exam Vitals and nursing note reviewed.  Constitutional:      General: She is not in acute distress.    Appearance: Normal appearance. She is well-developed and well-groomed. She is obese. She is not ill-appearing, toxic-appearing or diaphoretic.  HENT:     Head: Normocephalic and atraumatic.     Right Ear: External ear normal.     Left Ear: External ear normal.  Eyes:     General: Lids are normal. No scleral icterus.       Right eye: No discharge.        Left eye: No discharge.     Conjunctiva/sclera: Conjunctivae normal.  Neck:     Trachea: Phonation normal. No tracheal deviation.  Cardiovascular:     Rate and Rhythm: Normal rate  and regular rhythm.     Pulses: Normal pulses.          Radial pulses are 2+ on the right side and 2+ on the left side.       Posterior tibial pulses are 2+ on the right side and 2+ on the left side.     Heart sounds: Normal heart sounds. No murmur heard.    No friction rub. No gallop.     Comments: Mild bilateral non-pitting ankle edema w/o pedal and pretibial edema Pulmonary:     Effort: Pulmonary effort is normal. No respiratory distress.     Breath sounds: Normal breath sounds. No stridor. No wheezing, rhonchi or rales.  Chest:     Chest wall: No tenderness.  Abdominal:     General: Bowel sounds are normal. There is no distension.     Palpations: Abdomen is soft.     Tenderness: There is no abdominal tenderness. There is no guarding or rebound.  Musculoskeletal:        General: No deformity.     Cervical back: Normal range of motion and neck supple.  Skin:    General: Skin is warm and dry.     Capillary Refill: Capillary refill takes less than 2 seconds.     Coloration: Skin is not jaundiced or pale.     Findings: No rash.  Neurological:     Mental Status: She is alert. Mental status is at baseline.     Motor: No abnormal muscle tone.     Gait: Gait normal.  Psychiatric:        Mood and Affect: Mood normal.        Speech: Speech normal.        Behavior: Behavior normal. Behavior is  cooperative.      Diabetic Foot Exam - Simple   Simple Foot Form Diabetic Foot exam was performed with the following findings: Yes 04/15/2022  8:45 AM  Visual Inspection No deformities, no ulcerations, no other skin breakdown bilaterally: Yes Sensation Testing Intact to touch and monofilament testing bilaterally: Yes Pulse Check Posterior Tibialis and Dorsalis pulse intact bilaterally: Yes Comments       Assessment & Plan:   Problem List Items Addressed This Visit       Cardiovascular and Mediastinum   Essential hypertension - Primary    Chronic, has been stable and well  controlled on lisinopril 10 mg daily Good compliance BP Readings from Last 3 Encounters:  04/15/22 128/76  01/09/22 132/70  01/07/22 110/70  BP at goal today       Relevant Orders   COMPLETE METABOLIC PANEL WITH GFR   Chronic diastolic heart failure ([place])    She has some scant ankle edema bilaterally which does not occur with any orthopnea, PND, weight gain, DOE, monitoring      Paroxysmal tachycardia ([place])    Per cardiology Currently on metoprolol 25 mg daily and sx are well controlled, no palpitations, and no BB SE or concerns Pulse Readings from Last 3 Encounters:  04/15/22 88  01/09/22 74  01/07/22 68           Endocrine   DM (diabetes mellitus), type 2 ([place])    Has been well controlled with diet/lifestyle efforts only and no meds She got libre 3 meter due to concerns of high sugars, we reviewed her CGM today - additional 8+ min spent reviewing her readings Denies: Polyuria, polydipsia, vision changes, neuropathy, hypoglycemia Recent pertinent labs: Lab Results  Component Value Date   HGBA1C 6.5 (H) 10/08/2021   HGBA1C 6.1 (H) 03/02/2021   HGBA1C 6.2 (H) 07/03/2020   Standard of care and health maintenance: Urine Microalbumin:  Due and done today Foot exam:  Done today - normal, no concerns DM eye exam:  Done April 2023 - UTD ACEI/ARB:  Lisinopril  Statin:  crestor  I believe her A1C will still be at goal and well controlled We discussed meds like metformin and ozempic- but despite her prior interest, she would like to avoid additional medication unless they are needed to keep A1C at goal.       Relevant Orders   Hemoglobin A1c   Microalbumin / creatinine urine ratio   HM DIABETES FOOT EXAM (Completed)     Nervous and Auditory   Neuropathy    Prior neuro work up Managed with gabapentin and keeping B12 adequately supplemented - we are doing B12 shots here Refilled her gabapentin, she has f/up with neuro about once a year - she may stop following with  them and just have primary care manage meds        Genitourinary   Stage 3a chronic kidney disease ([place])    Lab Results  Component Value Date   EGFR 75 10/08/2021   EGFR 83 03/02/2021  GFR lower in the past - has recently been improved Will remove from chart but renal function will continue to be monitored with DM and HTN labs         Other   HLD (hyperlipidemia)    Compliant with meds- crestor 10 and zetia No SE, no myalgias, fatigue or jaundice Due for annual lipids and recheck CMP Last lipids LDL was well controlled Lab Results  Component Value Date   CHOL 126 04/12/2021  HDL 47 04/12/2021   LDLCALC 62 04/12/2021   LDLDIRECT 149.5 12/25/2006   TRIG 90 04/12/2021   CHOLHDL 2.7 04/12/2021  for now continue meds and Diet and exercise efforts      Relevant Orders   COMPLETE METABOLIC PANEL WITH GFR   Lipid panel   Class 2 severe obesity with serious comorbidity and body mass index (BMI) of 39.0 to 39.9 in adult [facility])    Central obesity, associated comorbidities HLD, HTN, DM, neuropathy, diastolic HF Discussed diet/lifestyle efforts and med options which she may qualify for with Dx of DM Pt will work on diet      B12 deficiency    Last B12 improved with continued monthly B12 injections Lab Results  Component Value Date   VITAMINB12 473 10/08/2021         Peripheral edema    B/l ankle edema, stable with conservative management - discussed dependent edema and LE edema related to CHF sx - which she does not currently have She managed with compression stockings - reviewed management and explained options for further eval if any worsening (cardiac w/o, eval in office or even vascular referral)      Other Visit Diagnoses     Need for influenza vaccination       Relevant Orders   Flu Vaccine QUAD High Dose(Fluad) (Completed)   Need for shingles vaccine       sent to pharmacy   Relevant Medications   Zoster Vaccine Adjuvanted [facility]) injection         Return in about 6 months (around 10/15/2022) for Routine follow-up.   Delsa Grana, PA-C 04/15/22 8:50 AM

## 2022-04-15 NOTE — Assessment & Plan Note (Signed)
B/l ankle edema, stable with conservative management - discussed dependent edema and LE edema related to CHF sx - which she does not currently have She managed with compression stockings - reviewed management and explained options for further eval if any worsening (cardiac w/o, eval in office or even vascular referral)

## 2022-04-15 NOTE — Assessment & Plan Note (Signed)
She has some scant ankle edema bilaterally which does not occur with any orthopnea, PND, weight gain, DOE, monitoring

## 2022-04-15 NOTE — Assessment & Plan Note (Signed)
Chronic, has been stable and well controlled on lisinopril 10 mg daily Good compliance BP Readings from Last 3 Encounters:  04/15/22 128/76  01/09/22 132/70  01/07/22 110/70  BP at goal today

## 2022-04-16 LAB — COMPLETE METABOLIC PANEL WITH GFR
AG Ratio: 1.4 (calc) (ref 1.0–2.5)
ALT: 17 U/L (ref 6–29)
AST: 18 U/L (ref 10–35)
Albumin: 4.2 g/dL (ref 3.6–5.1)
Alkaline phosphatase (APISO): 52 U/L (ref 37–153)
BUN: 14 mg/dL (ref 7–25)
CO2: 30 mmol/L (ref 20–32)
Calcium: 9.4 mg/dL (ref 8.6–10.4)
Chloride: 103 mmol/L (ref 98–110)
Creat: 0.99 mg/dL (ref 0.60–1.00)
Globulin: 3.1 g/dL (calc) (ref 1.9–3.7)
Glucose, Bld: 104 mg/dL (ref 65–139)
Potassium: 4.5 mmol/L (ref 3.5–5.3)
Sodium: 140 mmol/L (ref 135–146)
Total Bilirubin: 0.6 mg/dL (ref 0.2–1.2)
Total Protein: 7.3 g/dL (ref 6.1–8.1)
eGFR: 61 mL/min/{1.73_m2} (ref >=60)

## 2022-04-16 LAB — MICROALBUMIN / CREATININE URINE RATIO
Creatinine, Urine: 87 mg/dL (ref 20–275)
Microalb Creat Ratio: 2 mcg/mg creat (ref <30)
Microalb, Ur: 0.2 mg/dL

## 2022-04-16 LAB — LIPID PANEL
Cholesterol: 121 mg/dL (ref <200)
HDL: 51 mg/dL (ref >=50)
LDL Cholesterol (Calc): 51 mg/dL (calc)
Non-HDL Cholesterol (Calc): 70 mg/dL (calc) (ref <130)
Total CHOL/HDL Ratio: 2.4 (calc) (ref <5.0)
Triglycerides: 100 mg/dL (ref <150)

## 2022-04-16 LAB — HEMOGLOBIN A1C
Hgb A1c MFr Bld: 6.7 % of total Hgb — ABNORMAL HIGH (ref <5.7)
Mean Plasma Glucose: 146 mg/dL
eAG (mmol/L): 8.1 mmol/L

## 2022-04-23 ENCOUNTER — Inpatient Hospital Stay: Admission: RE | Admit: 2022-04-23 | Payer: Medicare HMO | Source: Ambulatory Visit

## 2022-04-30 ENCOUNTER — Ambulatory Visit
Admission: RE | Admit: 2022-04-30 | Discharge: 2022-04-30 | Disposition: A | Discharge status: Home / Self Care | Payer: Medicare HMO | Source: Ambulatory Visit | Attending: Family Medicine | Admitting: Family Medicine

## 2022-04-30 DIAGNOSIS — N63 Unspecified lump in unspecified breast: Secondary | ICD-10-CM | POA: Diagnosis not present

## 2022-04-30 DIAGNOSIS — R928 Other abnormal and inconclusive findings on diagnostic imaging of breast: Secondary | ICD-10-CM | POA: Diagnosis not present

## 2022-04-30 DIAGNOSIS — N6325 Unspecified lump in the left breast, overlapping quadrants: Secondary | ICD-10-CM | POA: Diagnosis not present

## 2022-04-30 HISTORY — PX: BREAST BIOPSY: SHX20

## 2022-05-01 LAB — SURGICAL PATHOLOGY

## 2022-05-08 ENCOUNTER — Telehealth: Payer: Self-pay

## 2022-05-08 NOTE — Progress Notes (Signed)
Chronic Care Management Pharmacy Assistant   Name: Jacqueline [name] [facility]  MRN: [id number] DOB: 12-23-1950  Reason for Encounter: Medication Review/Medication Coordination for [facility]   Recent office visits:  None ID  Recent consult visits:  None ID  Hospital visits:  None in previous 6 months  Medications: Outpatient Encounter Medications as of 05/08/2022  Medication Sig   aspirin 81 MG chewable tablet Chew 81 mg by mouth daily.   blood glucose meter kit and supplies KIT Lifescan meter Check blood sugar 2 times daily as needed   calcium carbonate (OS-CAL) 600 MG TABS tablet Take 600 mg by mouth 2 (two) times daily with a meal.   Cholecalciferol (VITAMIN D) 50 MCG (2000 UT) CAPS Take 1 capsule by mouth daily.   Continuous Blood Gluc Sensor (FREESTYLE LIBRE 3 SENSOR) MISC 1 Device by Does not apply route every 14 (fourteen) days.   ezetimibe (ZETIA) 10 MG tablet Take 1 tablet by mouth once daily   gabapentin (NEURONTIN) 300 MG capsule Take 1 tablet in the AM and 2 tablets in the evening   Lancets (ONETOUCH DELICA PLUS [id number]) MISC USE AS DIRECTED TWICE A DAY AS NEEDED   lisinopril (ZESTRIL) 10 MG tablet TAKE ONE TABLET BY MOUTH ONCE DAILY   Magnesium Oxide 500 MG CAPS Take 1 capsule by mouth daily.   metoprolol tartrate (LOPRESSOR) 25 MG tablet TAKE ONE TABLET BY MOUTH TWICE DAILY   ONETOUCH ULTRA test strip USE AS DIRECTED TWICE A DAY AS NEEDED   Potassium Gluconate 595 MG CAPS Take 595 mg by mouth daily.   rosuvastatin (CRESTOR) 10 MG tablet TAKE ONE TABLET BY MOUTH ONCE DAILY   traZODone (DESYREL) 50 MG tablet TAKE ONE TABLET BY MOUTH EVERYDAY AT BEDTIME   VITAMIN E PO Take 800 Units by mouth daily.   zinc gluconate 50 MG tablet Take 50 mg by mouth daily.   No facility-administered encounter medications on file as of 05/08/2022.   Care Gaps: Zoster Vaccines  Star Rating Drugs: Lisinopril 10 mg last filled on 04/15/2022 for a 30-Day supply with [facility] Rosuvastatin 10 mg last filled on 04/15/2022 for a 30-Day supply with [facility]  BP Readings from Last 3 Encounters:  04/15/22 128/76  01/09/22 132/70  01/07/22 110/70    Lab Results  Component Value Date   HGBA1C 6.7 (H) 04/15/2022   Patient obtains medications through Adherence Packaging  30 Days   Last adherence delivery included:  Lisinopril 10 mg 1 tablet daily with (Breakfast) Metoprolol Tartrate 25 mg 1 tablet twice daily with (Breakfast and Evening meals) Trazodone 50 mg 1 tablet at (Bedtime) Rosuvastatin 10 mg 1 tablet daily with (Breakfast) Gabapentin 300 mg 1 tablet three times daily at [employer], Lunch, and Evening meals)  Patient declined the following medications for the month of October: Ezetimibe- patient fills this at [facility] as it is cheaper for her than at [facility]  Patient is due for next adherence delivery on: 05/20/2022 (Monday) 1st Route.  Called patient and reviewed medications and coordinated delivery.  This delivery to include: Lisinopril 10 mg 1 tablet daily with (Breakfast) Metoprolol Tartrate 25 mg 1 tablet twice daily with (Breakfast and Evening meals) Trazodone 50 mg 1 tablet at (Bedtime) Rosuvastatin 10 mg 1 tablet daily with (Breakfast) Gabapentin 300 mg 1 tablet three times daily at [employer], Lunch, and Evening meals)  Patient declined the following medications for the month of November: Ezetimibe- patient fills this at [facility] as it is cheaper for her  than at [facility]  Patient needs refills for the month of November: No refills needed  Confirmed delivery date of 05/20/2022 1st Route, advised patient that pharmacy will contact them the morning of delivery.  Patient is a [facility] patient. No follow-up's required with [facility] unless patient request.  Lynann Bologna, CPA/CMA Clinical Pharmacist Assistant Phone: [phone]

## 2022-05-10 ENCOUNTER — Ambulatory Visit (INDEPENDENT_AMBULATORY_CARE_PROVIDER_SITE_OTHER): Payer: Medicare HMO

## 2022-05-10 ENCOUNTER — Ambulatory Visit: Payer: Medicare HMO

## 2022-05-10 DIAGNOSIS — E538 Deficiency of other specified B group vitamins: Secondary | ICD-10-CM

## 2022-05-10 MED ORDER — CYANOCOBALAMIN 1000 MCG/ML IJ SOLN
1000.0000 ug | Freq: Once | INTRAMUSCULAR | Status: AC
  Administered 2022-05-10: 1000 ug via INTRAMUSCULAR

## 2022-05-22 ENCOUNTER — Other Ambulatory Visit: Payer: Self-pay | Admitting: Cardiovascular Disease

## 2022-05-22 DIAGNOSIS — E785 Hyperlipidemia, unspecified: Secondary | ICD-10-CM

## 2022-05-22 DIAGNOSIS — I700 Atherosclerosis of aorta: Secondary | ICD-10-CM

## 2022-06-06 ENCOUNTER — Telehealth: Payer: Self-pay

## 2022-06-06 NOTE — Progress Notes (Signed)
Chronic Care Management Pharmacy Assistant   Name: Jacqueline [name] [facility]  MRN: [id number] DOB: May 03, 1951  Reason for Encounter: Medication Review/Medication Coordination for [facility]    Recent office visits:  None ID  Recent consult visits:  None ID  Hospital visits:  None in previous 6 months  Medications: Outpatient Encounter Medications as of 06/06/2022  Medication Sig   aspirin 81 MG chewable tablet Chew 81 mg by mouth daily.   blood glucose meter kit and supplies KIT Lifescan meter Check blood sugar 2 times daily as needed   calcium carbonate (OS-CAL) 600 MG TABS tablet Take 600 mg by mouth 2 (two) times daily with a meal.   Cholecalciferol (VITAMIN D) 50 MCG (2000 UT) CAPS Take 1 capsule by mouth daily.   Continuous Blood Gluc Sensor (FREESTYLE LIBRE 3 SENSOR) MISC 1 Device by Does not apply route every 14 (fourteen) days.   ezetimibe (ZETIA) 10 MG tablet Take 1 tablet by mouth once daily   gabapentin (NEURONTIN) 300 MG capsule Take 1 tablet in the AM and 2 tablets in the evening   Lancets (ONETOUCH DELICA PLUS [id number]) MISC USE AS DIRECTED TWICE A DAY AS NEEDED   lisinopril (ZESTRIL) 10 MG tablet TAKE ONE TABLET BY MOUTH ONCE DAILY   Magnesium Oxide 500 MG CAPS Take 1 capsule by mouth daily.   metoprolol tartrate (LOPRESSOR) 25 MG tablet TAKE ONE TABLET BY MOUTH TWICE DAILY   ONETOUCH ULTRA test strip USE AS DIRECTED TWICE A DAY AS NEEDED   Potassium Gluconate 595 MG CAPS Take 595 mg by mouth daily.   rosuvastatin (CRESTOR) 10 MG tablet TAKE ONE TABLET BY MOUTH ONCE DAILY   traZODone (DESYREL) 50 MG tablet TAKE ONE TABLET BY MOUTH EVERYDAY AT BEDTIME   VITAMIN E PO Take 800 Units by mouth daily.   zinc gluconate 50 MG tablet Take 50 mg by mouth daily.   No facility-administered encounter medications on file as of 06/06/2022.   Care Gaps: Zoster Vaccines Dtap/Tdap   Star Rating Drugs: Lisinopril 10 mg last filled on 05/14/2022 for a 30-Day supply with  [facility] Rosuvastatin 10 mg last filled on 05/14/2022 for a 30-Day supply with [facility]  BP Readings from Last 3 Encounters:  04/15/22 128/76  01/09/22 132/70  01/07/22 110/70    Lab Results  Component Value Date   HGBA1C 6.7 (H) 04/15/2022    Patient obtains medications through Adherence Packaging  30 Days   Last adherence delivery included:  Lisinopril 10 mg 1 tablet daily with (Breakfast) Metoprolol Tartrate 25 mg 1 tablet twice daily with (Breakfast and Evening meals) Trazodone 50 mg 1 tablet at (Bedtime) Rosuvastatin 10 mg 1 tablet daily with (Breakfast) Gabapentin 300 mg 1 tablet three times daily at [employer], Lunch, and Evening meals)  Patient declined the following medications for the month of November: Ezetimibe- patient fills this at [facility] as it is cheaper for her than at [facility]  Patient is due for next adherence delivery on: 06/18/2022 1st Route.  Called patient and reviewed medications and coordinated delivery.  This delivery to include: Lisinopril 10 mg 1 tablet daily with (Breakfast) Metoprolol Tartrate 25 mg 1 tablet twice daily with (Breakfast and Evening meals) Trazodone 50 mg 1 tablet at (Bedtime) Rosuvastatin 10 mg 1 tablet daily with (Breakfast) Gabapentin 300 mg 1 tablet three times daily at [employer], Lunch, and Evening meals)  Patient declined the following medications for the month of December:  Ezetimibe- patient fills this at [facility] as it  is cheaper for her than at [facility]   Patient needs refills for the month of December: No refills needed for this month's delivery  Confirmed delivery date of 06/18/2022 1st Route, advised patient that pharmacy will contact them the morning of delivery.  Patient is a [facility] patient. No follow-up's required with [facility] unless patient request.   Lynann Bologna, CPA/CMA Clinical Pharmacist Assistant Phone: [phone]

## 2022-06-10 ENCOUNTER — Ambulatory Visit (INDEPENDENT_AMBULATORY_CARE_PROVIDER_SITE_OTHER): Payer: Medicare HMO

## 2022-06-10 DIAGNOSIS — E538 Deficiency of other specified B group vitamins: Secondary | ICD-10-CM | POA: Diagnosis not present

## 2022-06-10 MED ORDER — CYANOCOBALAMIN 1000 MCG/ML IJ SOLN
1000.0000 ug | Freq: Once | INTRAMUSCULAR | Status: AC
  Administered 2022-06-10: 1000 ug via INTRAMUSCULAR

## 2022-07-09 ENCOUNTER — Telehealth: Payer: Self-pay

## 2022-07-09 NOTE — Progress Notes (Unsigned)
Pharmacy Assistant   Name: Jacqueline [name] [facility]  MRN: [id number] DOB: 02/19/1951  Reason for Encounter: Medication coordination and delivery  Contacted patient on 07/09/2022 to discuss medications   Recent office visits:  None ID  Recent consult visits:  None ID  Hospital visits:  None in previous 6 months  Medications: Outpatient Encounter Medications as of 07/09/2022  Medication Sig   aspirin 81 MG chewable tablet Chew 81 mg by mouth daily.   blood glucose meter kit and supplies KIT Lifescan meter Check blood sugar 2 times daily as needed   calcium carbonate (OS-CAL) 600 MG TABS tablet Take 600 mg by mouth 2 (two) times daily with a meal.   Cholecalciferol (VITAMIN D) 50 MCG (2000 UT) CAPS Take 1 capsule by mouth daily.   Continuous Blood Gluc Sensor (FREESTYLE LIBRE 3 SENSOR) MISC 1 Device by Does not apply route every 14 (fourteen) days.   ezetimibe (ZETIA) 10 MG tablet Take 1 tablet by mouth once daily   gabapentin (NEURONTIN) 300 MG capsule Take 1 tablet in the AM and 2 tablets in the evening   Lancets (ONETOUCH DELICA PLUS [id number]) MISC USE AS DIRECTED TWICE A DAY AS NEEDED   lisinopril (ZESTRIL) 10 MG tablet TAKE ONE TABLET BY MOUTH ONCE DAILY   Magnesium Oxide 500 MG CAPS Take 1 capsule by mouth daily.   metoprolol tartrate (LOPRESSOR) 25 MG tablet TAKE ONE TABLET BY MOUTH TWICE DAILY   ONETOUCH ULTRA test strip USE AS DIRECTED TWICE A DAY AS NEEDED   Potassium Gluconate 595 MG CAPS Take 595 mg by mouth daily.   rosuvastatin (CRESTOR) 10 MG tablet TAKE ONE TABLET BY MOUTH ONCE DAILY   traZODone (DESYREL) 50 MG tablet TAKE ONE TABLET BY MOUTH EVERYDAY AT BEDTIME   VITAMIN E PO Take 800 Units by mouth daily.   zinc gluconate 50 MG tablet Take 50 mg by mouth daily.   No facility-administered encounter medications on file as of 07/09/2022.   BP Readings from Last 3 Encounters:  04/15/22 128/76  01/09/22 132/70  01/07/22 110/70    Pulse Readings from Last 3 Encounters:   04/15/22 88  01/09/22 74  01/07/22 68    Lab Results  Component Value Date/Time   HGBA1C 6.7 (H) 04/15/2022 09:27 AM   HGBA1C 6.5 (H) 10/08/2021 11:46 AM   Lab Results  Component Value Date   CREATININE 0.99 04/15/2022   BUN 14 04/15/2022   GFR 45.58 (L) 04/30/2019   GFRNONAA >60 01/09/2022   GFRAA 83 07/03/2020   NA 140 04/15/2022   K 4.5 04/15/2022   CALCIUM 9.4 04/15/2022   CO2 30 04/15/2022   Last adherence delivery date:06/18/2022 1st Route      Patient is due for next adherence delivery on: 07/18/2022 1st Route  Spoke with patient on 07/09/2022 reviewed medications and coordinated delivery.  This delivery to include: Adherence Packaging  30 Days  Lisinopril 10 mg 1 tablet daily with (Breakfast) Metoprolol Tartrate 25 mg 1 tablet twice daily with (Breakfast and Evening meals) Trazodone 50 mg 1 tablet at (Bedtime) Rosuvastatin 10 mg 1 tablet daily with (Breakfast) Gabapentin 300 mg 1 tablet three times daily at [employer], Lunch, and Evening meals)  Patient declined the following medications this month: No medications were declined for January's Delivery  Refills requested from providers include: Rosuvastatin 10 mg Lisinopril 10 mg Trazodone 50 mg  Confirmed delivery date of 07/18/2022 1st Route, advised patient that pharmacy will contact them the morning of delivery.  Any concerns about your medications? No  How often do you forget or accidentally miss a dose? Never  Do you use a pillbox? No  Is patient in packaging Yes  If yes  What is the date on your next pill pack? Patient not home  Any concerns or issues with your packaging? No  Cycle dispensing form sent to [name], CPP for review.   Lynann Bologna, CPA/CMA Clinical Pharmacist Assistant Phone: [phone]

## 2022-07-11 ENCOUNTER — Ambulatory Visit: Payer: Medicare HMO | Attending: Cardiology | Admitting: Cardiology

## 2022-07-11 ENCOUNTER — Ambulatory Visit (INDEPENDENT_AMBULATORY_CARE_PROVIDER_SITE_OTHER): Payer: Medicare HMO

## 2022-07-11 ENCOUNTER — Encounter: Payer: Self-pay | Admitting: Cardiology

## 2022-07-11 VITALS — BP 132/80 | HR 75 | Ht 64.0 in | Wt 235.0 lb

## 2022-07-11 DIAGNOSIS — I479 Paroxysmal tachycardia, unspecified: Secondary | ICD-10-CM | POA: Diagnosis not present

## 2022-07-11 DIAGNOSIS — E785 Hyperlipidemia, unspecified: Secondary | ICD-10-CM | POA: Diagnosis not present

## 2022-07-11 DIAGNOSIS — I10 Essential (primary) hypertension: Secondary | ICD-10-CM | POA: Diagnosis not present

## 2022-07-11 DIAGNOSIS — R002 Palpitations: Secondary | ICD-10-CM

## 2022-07-11 DIAGNOSIS — E538 Deficiency of other specified B group vitamins: Secondary | ICD-10-CM | POA: Diagnosis not present

## 2022-07-11 DIAGNOSIS — I5032 Chronic diastolic (congestive) heart failure: Secondary | ICD-10-CM | POA: Diagnosis not present

## 2022-07-11 MED ORDER — CYANOCOBALAMIN 1000 MCG/ML IJ SOLN
1000.0000 ug | Freq: Once | INTRAMUSCULAR | Status: AC
  Administered 2022-07-11: 1000 ug via INTRAMUSCULAR

## 2022-07-11 NOTE — Progress Notes (Signed)
Cardiology Clinic Note   Patient Name: Jacqueline [name] [facility] Date of Encounter: 07/11/2022  Primary Care Provider:  Delsa Grana, PA-C Primary Cardiologist:  None  Patient Profile    72 year old female with a past medical history of essential hypertension, hyperlipidemia, HFpEF, borderline diabetes, former smoker quit in 2013, paroxysmal tachycardia has been seen today for follow-up.  Past Medical History    Past Medical History:  Diagnosis Date   Calcium oxalate renal stones    Diabetes mellitus without complication ([place])    Hyperlipidemia    Hypertension    MVP (mitral valve prolapse)    Past Surgical History:  Procedure Laterality Date   BREAST BIOPSY Right 08/19/2012   neg, Dr. Bary Castilla   BREAST BIOPSY Left 04/30/2022   [place] bx, ribbon marker, path pending   extraction of renal stones     TONSILLECTOMY AND ADENOIDECTOMY     VAGINAL HYSTERECTOMY  1980   class IV, ovaries intact    Allergies  No Known Allergies  History of Present Illness    Jacqueline [name] is a 72 year old female with previously mentioned past medical history of essential hypertension, hyperlipidemia, HFpEF, borderline diabetes, former smoker quit in 2013, paroxysmal tachycardia, palpitations.  She was previously on a heart monitor that showed predominantly normal sinus rhythm with 5 runs of SVT with the longest interval lasting 6 beats.  Coronary calcium score was completed in 08/25/2019 with result of 1259 which is the 98th percentile for age and sex matched control.  She was advised on lifestyle modifications and better control of her cholesterol with a goal of less than 70 previous echocardiogram completed with LVEF of 60 to 65%, no regional wall motion abnormalities, G1 DD, and no valvular abnormalities noted.  She was last seen in clinic 01/07/2022 where she was doing fairly well with continued occasional palpitations was continued taking her metoprolol and had been following up with her primary  care provider with increasing dose of gabapentin for worsening neuropathy.  She returns to clinic today stating that she has been feeling fine. No episodes of palpitations since continuing on metoprolol tartrate. Denies chest pain, shortness of breath, or peripheral edema. Denies any visits to the emergency department of recent hospitalizations. Only complaint she has today is some weight gain over the holidays.   Home Medications    Current Outpatient Medications  Medication Sig Dispense Refill   aspirin 81 MG chewable tablet Chew 81 mg by mouth daily.     blood glucose meter kit and supplies KIT Lifescan meter Check blood sugar 2 times daily as needed 1 each 0   calcium carbonate (OS-CAL) 600 MG TABS tablet Take 600 mg by mouth 2 (two) times daily with a meal. 30 tablet 0   Cholecalciferol (VITAMIN D) 50 MCG (2000 UT) CAPS Take 1 capsule by mouth daily. 30 capsule    Continuous Blood Gluc Sensor (FREESTYLE LIBRE 3 SENSOR) MISC 1 Device by Does not apply route every 14 (fourteen) days. 1 each 0   ezetimibe (ZETIA) 10 MG tablet Take 1 tablet by mouth once daily 90 tablet 0   gabapentin (NEURONTIN) 300 MG capsule Take 1 tablet in the AM and 2 tablets in the evening 90 capsule 11   Lancets (ONETOUCH DELICA PLUS [id number]) MISC USE AS DIRECTED TWICE A DAY AS NEEDED 200 each 0   lisinopril (ZESTRIL) 10 MG tablet TAKE ONE TABLET BY MOUTH ONCE DAILY 90 tablet 1   Magnesium Oxide 500 MG CAPS Take 1  capsule by mouth daily.     metoprolol tartrate (LOPRESSOR) 25 MG tablet TAKE ONE TABLET BY MOUTH TWICE DAILY 180 tablet 3   ONETOUCH ULTRA test strip USE AS DIRECTED TWICE A DAY AS NEEDED 200 strip 1   Potassium Gluconate 595 MG CAPS Take 595 mg by mouth daily.     rosuvastatin (CRESTOR) 10 MG tablet TAKE ONE TABLET BY MOUTH ONCE DAILY 90 tablet 1   traZODone (DESYREL) 50 MG tablet TAKE ONE TABLET BY MOUTH EVERYDAY AT BEDTIME 90 tablet 1   VITAMIN E PO Take 800 Units by mouth daily.     zinc gluconate 50  MG tablet Take 50 mg by mouth daily.     No current facility-administered medications for this visit.     Family History    Family History  Problem Relation Age of Onset   Hypertension Mother    Dementia Mother    Stroke Mother    Cancer - Other Father        Bile Duct   Heart disease Maternal Grandmother    Heart disease Maternal Grandfather    Hypertension Paternal Grandmother    Cancer Maternal Aunt        breast and lung   Stroke Paternal Grandfather    She indicated that her mother is deceased. She indicated that her father is deceased. She indicated that her sister is alive. She indicated that her maternal grandmother is deceased. She indicated that her maternal grandfather is deceased. She indicated that her paternal grandmother is deceased. She indicated that her paternal grandfather is deceased. She indicated that her daughter is alive. She indicated that her son is alive. She indicated that her maternal aunt is deceased.  Social History    Social History   Socioeconomic History   Marital status: Married    Spouse name: Richardson Landry   Number of children: 2   Years of education: Not on file   Highest education level: Some college, no degree  Occupational History   Occupation: Naval architect  Tobacco Use   Smoking status: Former    Packs/day: 0.25    Years: 30.00    Total pack years: 7.50    Types: Cigarettes    Start date: 07/09/1983    Quit date: 05/27/2014    Years since quitting: 8.1   Smokeless tobacco: Never  Vaping Use   Vaping Use: Never used  Substance and Sexual Activity   Alcohol use: Yes    Alcohol/week: 4.0 standard drinks of alcohol    Types: 4 Glasses of wine per week    Comment: occasional   Drug use: No   Sexual activity: Yes    Partners: Male  Other Topics Concern   Not on file  Social History Narrative   Not on file   Social Determinants of Health   Financial Resource Strain: Low Risk  (08/14/2021)   Overall Financial Resource Strain  (CARDIA)    Difficulty of Paying Living Expenses: Not hard at all  Food Insecurity: No Food Insecurity (08/14/2021)   Hunger Vital Sign    Worried About Running Out of Food in the Last Year: Never true    Ran Out of Food in the Last Year: Never true  Transportation Needs: No Transportation Needs (08/14/2021)   PRAPARE - Hydrologist (Medical): No    Lack of Transportation (Non-Medical): No  Physical Activity: Sufficiently Active (08/14/2021)   Exercise Vital Sign    Days of Exercise per Week:  7 days    Minutes of Exercise per Session: 30 min  Stress: No Stress Concern Present (08/14/2021)   [place]    Feeling of Stress : Not at all  Social Connections: [place] (08/14/2021)   Social Connection and Isolation Panel [NHANES]    Frequency of Communication with Friends and Family: More than three times a week    Frequency of Social Gatherings with Friends and Family: More than three times a week    Attends Religious Services: More than 4 times per year    Active Member of [employer] or Organizations: Yes    Attends Music therapist: More than 4 times per year    Marital Status: Married  Human resources officer Violence: Not At Risk (08/14/2021)   Humiliation, Afraid, Rape, and Kick questionnaire    Fear of Current or Ex-Partner: No    Emotionally Abused: No    Physically Abused: No    Sexually Abused: No     Review of Systems    General:  No chills, fever, night sweats or endorses weight changes.  Cardiovascular:  No chest pain, dyspnea on exertion, edema, orthopnea, palpitations, paroxysmal nocturnal dyspnea. Dermatological: No rash, lesions/masses Respiratory: No cough, dyspnea Urologic: No hematuria, dysuria Abdominal:   No nausea, vomiting, diarrhea, bright red blood per rectum, melena, or hematemesis Neurologic:  No visual changes, wkns, changes in mental status. All other  systems reviewed and are otherwise negative except as noted above.   Physical Exam    VS:  BP 132/80 (BP Location: Right Arm, Patient Position: Sitting, Cuff Size: Large)   Pulse 75   Ht _0  (1.626 m)   Wt 235 lb (106.6 kg)   SpO2 96%   BMI 40.34 kg/m  , BMI Body mass index is 40.34 kg/m.     Vitals:   07/11/22 1104 07/11/22 1114  BP: (!) 148/80 132/80    GEN: Well nourished, well developed, in no acute distress. HEENT: normal. Neck: Supple, no JVD, carotid bruits, or masses. Cardiac: RRR, no murmurs, rubs, or gallops. No clubbing, cyanosis, edema.  Radials 2+/PT 2+ and equal bilaterally.  Respiratory:  Respirations regular and unlabored, clear to auscultation bilaterally. GI: Soft, nontender, nondistended, BS + x 4. MS: no deformity or atrophy. Skin: warm and dry, no rash. Neuro:  Strength and sensation are intact. Psych: Normal affect.  Accessory Clinical Findings    ECG personally reviewed by me today- sinus rhythm rate of 75 with LVH - No acute changes  Lab Results  Component Value Date   WBC 8.0 01/09/2022   HGB 14.0 01/09/2022   HCT 42.3 01/09/2022   MCV 91.6 01/09/2022   PLT 199 01/09/2022   Lab Results  Component Value Date   CREATININE 0.99 04/15/2022   BUN 14 04/15/2022   NA 140 04/15/2022   K 4.5 04/15/2022   CL 103 04/15/2022   CO2 30 04/15/2022   Lab Results  Component Value Date   ALT 17 04/15/2022   AST 18 04/15/2022   ALKPHOS 53 01/09/2022   BILITOT 0.6 04/15/2022   Lab Results  Component Value Date   CHOL 121 04/15/2022   HDL 51 04/15/2022   LDLCALC 51 04/15/2022   LDLDIRECT 149.5 12/25/2006   TRIG 100 04/15/2022   CHOLHDL 2.4 04/15/2022    Lab Results  Component Value Date   HGBA1C 6.7 (H) 04/15/2022    Assessment & Plan   1.  Essential hypertension with blood  pressure today of 148/80 with a  recheck 132/80. Remains stable. Continued on lisinopril 10 mg daily and metoprolol tartrate 25 mg bid. Encouraged to monitor blood  pressure at home as well.   2. Hyperlipidemia with last LDL of 51 04/15/22. Remains at goal of <70. She is continued on zetia 10 mg daily and rosuvastatin 10 mg daily. Continues to be managed by PCP.   3. Palpitations with a history of paroxysmal tachycardia. Currently continues to deny reoccurrence and has been continued on metoprolol tartrate 25  mg bid.  4. HFpEF with last echo with LVEF 60-65%, no shortness of breath and no edema on exam. Continued with lifestyle modifications.   5. Disposition patietn is to return to clinic to see MD/APP in 6 months or sooner if needed.   Tayjon Halladay, NP 07/11/2022, 12:31 PM

## 2022-07-11 NOTE — Patient Instructions (Signed)
Medication Instructions:   Your physician recommends that you continue on your current medications as directed. Please refer to the Current Medication list given to you today.  *If you need a refill on your cardiac medications before your next appointment, please call your pharmacy*   Lab Work:  NONE  If you have labs (blood work) drawn today and your tests are completely normal, you will receive your results only by: [place] (if you have MyChart) OR A paper copy in the mail If you have any lab test that is abnormal or we need to change your treatment, we will call you to review the results.   Testing/Procedures:  NONE   Follow-Up: At [facility], you and your health needs are our priority.  As part of our continuing mission to provide you with exceptional heart care, we have created designated Provider Care Teams.  These Care Teams include your primary Cardiologist (physician) and Advanced Practice Providers (APPs -  Physician Assistants and Nurse Practitioners) who all work together to provide you with the care you need, when you need it.  We recommend signing up for the patient portal called "MyChart".  Sign up information is provided on this After Visit Summary.  MyChart is used to connect with patients for Virtual Visits (Telemedicine).  Patients are able to view lab/test results, encounter notes, upcoming appointments, etc.  Non-urgent messages can be sent to your provider as well.   To learn more about what you can do with MyChart, go to [email or web address].    Your next appointment:   6 month(s)  The format for your next appointment:   In Person  Provider:   You may see Ida Rogue, MD     Important Information About Sugar

## 2022-07-15 ENCOUNTER — Other Ambulatory Visit: Payer: Self-pay | Admitting: Physician Assistant

## 2022-07-15 DIAGNOSIS — G4700 Insomnia, unspecified: Secondary | ICD-10-CM

## 2022-07-15 DIAGNOSIS — I10 Essential (primary) hypertension: Secondary | ICD-10-CM

## 2022-07-15 DIAGNOSIS — I700 Atherosclerosis of aorta: Secondary | ICD-10-CM

## 2022-07-15 DIAGNOSIS — E785 Hyperlipidemia, unspecified: Secondary | ICD-10-CM

## 2022-07-16 NOTE — Telephone Encounter (Signed)
Requested Prescriptions  Pending Prescriptions Disp Refills   traZODone (DESYREL) 50 MG tablet [Pharmacy Med Name: trazodone 50 mg tablet] 90 tablet 1    Sig: TAKE ONE TABLET BY MOUTH EVERYDAY AT BEDTIME     Psychiatry: Antidepressants - Serotonin Modulator Passed - 07/15/2022  8:16 AM      Passed - Valid encounter within last 6 months    Recent Outpatient Visits           3 months ago Essential hypertension   [facility] [place], Kristeen Miss, PA-C   4 months ago Type 2 diabetes mellitus with diabetic polyneuropathy, without long-term current use of insulin [facility])   [facility] Delsa Grana, PA-C   6 months ago [facility] Delsa Grana, PA-C   9 months ago Essential hypertension   [facility] Delsa Grana, PA-C   9 months ago Screening for tuberculosis   [place], DO       Future Appointments             In 1 month [facility]              rosuvastatin (CRESTOR) 10 MG tablet [Pharmacy Med Name: rosuvastatin 10 mg tablet] 90 tablet 1    Sig: TAKE ONE TABLET BY MOUTH ONCE DAILY     Cardiovascular:  Antilipid - Statins 2 Failed - 07/15/2022  8:16 AM      Failed - Lipid Panel in normal range within the last 12 months    Cholesterol, Total  Date Value Ref Range Status  04/12/2021 126 100 - 199 mg/dL Final   Cholesterol  Date Value Ref Range Status  04/15/2022 121 <200 mg/dL Final   LDL Cholesterol (Calc)  Date Value Ref Range Status  04/15/2022 51 mg/dL (calc) Final    Comment:    Reference range: <100 . Desirable range <100 mg/dL for primary prevention;   <70 mg/dL for patients with CHD or diabetic patients  with > or = 2 CHD risk factors. Marland Kitchen LDL-C is now calculated using the Martin-Hopkins  calculation, which is a validated novel method providing  better accuracy than the Friedewald equation in the  estimation of LDL-C.   Cresenciano Genre et al. Annamaria Helling. 4098;119(14): 2061-2068  (http://education.[employer].com/faq/FAQ164)    Direct LDL  Date Value Ref Range Status  12/25/2006 149.5 mg/dL Final    Comment:    See lab report for associated comment(s)   HDL  Date Value Ref Range Status  04/15/2022 51 > OR = 50 mg/dL Final  04/12/2021 47 >39 mg/dL Final   Triglycerides  Date Value Ref Range Status  04/15/2022 100 <150 mg/dL Final         Passed - Cr in normal range and within 360 days    Creat  Date Value Ref Range Status  04/15/2022 0.99 0.60 - 1.00 mg/dL Final   Creatinine, Urine  Date Value Ref Range Status  04/15/2022 87 20 - 275 mg/dL Final         Passed - Patient is not pregnant      Passed - Valid encounter within last 12 months    Recent Outpatient Visits           3 months ago Essential hypertension   [facility] [place], Kristeen Miss, PA-C   4 months ago Type 2 diabetes mellitus with diabetic polyneuropathy, without long-term current use of insulin ([place])  [facility] Delsa Grana, PA-C   6 months ago [facility] Delsa Grana, [place]   9 months ago Essential hypertension   [facility] Delsa Grana, [place]   9 months ago Screening for tuberculosis   [place], DO       Future Appointments             In 1 month [facility], [facility]              lisinopril (ZESTRIL) 10 MG tablet [Pharmacy Med Name: lisinopril 10 mg tablet] 90 tablet 1    Sig: TAKE ONE TABLET BY MOUTH ONCE DAILY     Cardiovascular:  ACE Inhibitors Passed - 07/15/2022  8:16 AM      Passed - Cr in normal range and within 180 days    Creat  Date Value Ref Range Status  04/15/2022 0.99 0.60 - 1.00 mg/dL Final   Creatinine, Urine  Date Value Ref Range Status  04/15/2022 87 20 - 275 mg/dL Final         Passed - K in normal range and within 180 days    Potassium   Date Value Ref Range Status  04/15/2022 4.5 3.5 - 5.3 mmol/L Final         Passed - Patient is not pregnant      Passed - Last BP in normal range    BP Readings from Last 1 Encounters:  07/11/22 132/80         Passed - Valid encounter within last 6 months    Recent Outpatient Visits           3 months ago Essential hypertension   [facility] Delsa Grana, PA-C   4 months ago Type 2 diabetes mellitus with diabetic polyneuropathy, without long-term current use of insulin [facility])   [facility] Delsa Gr[facility], PA-C   6 months ago [facility] Delsa Grana, PA-C   9 months ago Essential hypertension   [facility] Delsa Grana, PA-C   9 months ago Screening for tuberculosis   [place], [place], DO       Future Appointments             In 1 month [facility], [facility]

## 2022-08-08 ENCOUNTER — Ambulatory Visit (INDEPENDENT_AMBULATORY_CARE_PROVIDER_SITE_OTHER): Payer: Medicare HMO

## 2022-08-08 DIAGNOSIS — E538 Deficiency of other specified B group vitamins: Secondary | ICD-10-CM | POA: Diagnosis not present

## 2022-08-08 MED ORDER — CYANOCOBALAMIN 1000 MCG/ML IJ SOLN
1000.0000 ug | Freq: Once | INTRAMUSCULAR | Status: AC
  Administered 2022-08-08: 1000 ug via INTRAMUSCULAR

## 2022-08-15 ENCOUNTER — Ambulatory Visit (INDEPENDENT_AMBULATORY_CARE_PROVIDER_SITE_OTHER): Payer: Medicare HMO

## 2022-08-15 VITALS — BP 128/78 | Ht 64.0 in | Wt 233.7 lb

## 2022-08-15 DIAGNOSIS — Z0000 Encounter for general adult medical examination without abnormal findings: Secondary | ICD-10-CM | POA: Diagnosis not present

## 2022-08-15 DIAGNOSIS — Z1211 Encounter for screening for malignant neoplasm of colon: Secondary | ICD-10-CM

## 2022-08-15 NOTE — Progress Notes (Signed)
Pt presents in office for annual medicare wellness visit. I discussed the limitations, risks, security and privacy concerns of performing an evaluation and management service by telephone and the availability of in person appointments. The patient expressed understanding and agreed to proceed.  Interactive audio and video telecommunications were attempted between this nurse and patient, however failed, due to patient having technical difficulties OR patient did not have access to video capability.  We continued and completed visit with audio only.  Some vital signs may be absent or patient reported.   Roger Shelter, LPN  Subjective:   Jacqueline [name] is a 72 y.o. female who presents for Medicare Annual (Subsequent) preventive examination.  Review of Systems     Cardiac Risk Factors include: advanced age (>41mn, >>93women);diabetes mellitus;obesity (BMI >30kg/m2);hypertension     Objective:    Today's Vitals   08/15/22 1120  BP: 128/78  Weight: 233 lb 11.2 oz (106 kg)  Height: '5\' 4"'$  (1.626 m)   Body mass index is 40.11 kg/m.     08/15/2022   11:45 AM 09/03/2021    1:46 PM 08/14/2021   11:14 AM 08/03/2020   11:04 AM 07/30/2019   10:20 AM 06/14/2019    8:38 PM  Advanced Directives  Does Patient Have a Medical Advance Directive? Yes Yes Yes No No No  Type of [place]aramed[place]of [place] will [place]Living will [place]anche CreekLiving will     Does patient want to make changes to medical advance directive?  No - Patient declined      Copy of [place] Chart? No - copy requested  No - copy requested     Would patient like information on creating a medical advance directive?    Yes (MAU/Ambulatory/Procedural Areas - Information given) Yes (MAU/Ambulatory/Procedural Areas - Information given)     Current Medications (verified) Outpatient Encounter Medications as of 08/15/2022  Medication Sig    aspirin 81 MG chewable tablet Chew 81 mg by mouth daily.   blood glucose meter kit and supplies KIT Lif[employer]can meter Check blood sugar 2 times daily as needed   calcium carbonate (OS-CAL) 600 MG TABS tablet Take 600 mg by mouth 2 (two) times daily with a meal.   Cholecalciferol (VITAMIN D) 50 MCG (2000 UT) CAPS Take 1 capsule by mouth daily.   Continuous Blood Gluc Sensor (FREESTYLE LIBRE 3 SENSOR) MISC 1 Device by Does not apply route every 14 (fourteen) days.   ezetimibe (ZETIA) 10 MG tablet Take 1 tablet by mouth once daily   gabapentin (NEURONTIN) 300 MG capsule Take 1 tablet in the AM and 2 tablets in the evening   Lancets (ONETOUCH DELICA PLUS [id number] MISC USE AS DIRECTED TWICE A DAY AS NEEDED   lisinopril (ZESTRIL) 10 MG tablet TAKE ONE TABLET BY MOUTH ONCE DAILY   Magnesium Oxide 500 MG CAPS Take 1 capsule by mouth daily.   metoprolol tartrate (LOPRESSOR) 25 MG tablet TAKE ONE TABLET BY MOUTH TWICE DAILY   ONETOUCH ULTRA test strip USE AS DIRECTED TWICE A DAY AS NEEDED   Potassium Gluconate 595 MG CAPS Take 595 mg by mouth daily.   rosuvastatin (CRESTOR) 10 MG tablet TAKE ONE TABLET BY MOUTH ONCE DAILY   traZODone (DESYREL) 50 MG tablet TAKE ONE TABLET BY MOUTH EVERYDAY AT BEDTIME   VITAMIN E PO Take 800 Units by mouth daily.   zinc gluconate 50 MG tablet Take 50 mg by mouth daily.  No facility-administered encounter medications on file as of 08/15/2022.    Allergies (verified) Patient has no known allergies.   History: Past Medical History:  Diagnosis Date   Calcium oxalate renal stones    Diabetes mellitus without complication (HCC)    Hyperlipidemia    Hypertension    MVP (mitral valve prolapse)    Past Surgical History:  Procedure Laterality Date   BREAST BIOPSY Right 08/19/2012   neg, Dr. Bary Castilla   BREAST BIOPSY Left 04/30/2022   [place] bx, ribbon marker, path pending   extraction of renal stones     TONSILLECTOMY AND ADENOIDECTOMY     VAGINAL HYSTERECTOMY  1980    class IV, ovaries intact   Family History  Problem Relation Age of Onset   Hypertension Mother    Dementia Mother    Stroke Mother    Cancer - Other Father        Bile Duct   Heart disease Maternal Grandmother    Heart disease Maternal Grandfather    Hypertension Paternal Grandmother    Cancer Maternal Aunt        breast and lung   Stroke Paternal Grandfather    Social History   Socioeconomic History   Marital status: Married    Spouse name: Richardson Landry   Number of children: 2   Years of education: Not on file   Highest education level: Some college, no degree  Occupational History   Occupation: Naval architect  Tobacco Use   Smoking status: Former    Packs/day: 0.25    Years: 30.00    Total pack years: 7.50    Types: Cigarettes    Start date: 07/09/1983    Quit date: 05/27/2014    Years since quitting: 8.2   Smokeless tobacco: Never  Vaping Use   Vaping Use: Never used  Substance and Sexual Activity   Alcohol use: Yes    Alcohol/week: 4.0 standard drinks of alcohol    Types: 4 Glasses of wine per week    Comment: occasional   Drug use: No   Sexual activity: Yes    Partners: Male  Other Topics Concern   Not on file  Social History Narrative   Not on file   Social Determinants of Health   Financial Resource Strain: Low Risk  (08/15/2022)   Overall Financial Resource Strain (CARDIA)    Difficulty of Paying Living Expenses: Not hard at all  Food Insecurity: No Food Insecurity (08/15/2022)   Hunger Vital Sign    Worried About Running Out of Food in the Last Year: Never true    Ran Out of Food in the Last Year: Never true  Transportation Needs: No Transportation Needs (08/15/2022)   PRAPARE - Hydrologist (Medical): No    Lack of Transportation (Non-Medical): No  Physical Activity: Sufficiently Active (08/15/2022)   Exercise Vital Sign    Days of Exercise per Week: 5 days    Minutes of Exercise per Session: 30 min  Stress: No Stress  Concern Present (08/15/2022)   [place]    Feeling of Stress : Not at all  Social Connections: [place] (08/15/2022)   Social Connection and Isolation Panel [NHANES]    Frequency of Communication with Friends and Family: More than three times a week    Frequency of Social Gatherings with Friends and Family: More than three times a week    Attends Religious Services: More than 4 times  per year    Active Member of Clubs or Organizations: Yes    Attends Archivist Meetings: 1 to 4 times per year    Marital Status: Married    Tobacco Counseling Counseling given: Not Answered   Clinical Intake:  Pre-visit preparation completed: Yes  Pain : No/denies pain     BMI - recorded: 40.11 Nutritional Status: BMI > 30  Obese Nutritional Risks: None Diabetes: Yes  How often do you need to have someone help you when you read instructions, pamphlets, or other written materials from your doctor or pharmacy?: 1 - Never  Diabetic?yes     Information entered by :: B.Navya Timmons,LPN   Activities of Daily Living    08/15/2022   11:46 AM 04/15/2022    8:30 AM  In your present state of health, do you have any difficulty performing the following activities:  Hearing? 0 0  Vision? 0 0  Difficulty concentrating or making decisions? 0 0  Walking or climbing stairs? 0 0  Dressing or bathing? 0 0  Doing errands, shopping? 0 0  Preparing Food and eating ? N   Using the Toilet? N   In the past six months, have you accidently leaked urine? N   Do you have problems with loss of bowel control? N   Managing your Medications? N   Managing your Finances? N   Housekeeping or managing your Housekeeping? N     Patient Care Team: Delsa Grana, PA-C as PCP - General (Family Medicine) Minna Merritts, MD as Consulting Physician (Cardiology)  Indicate any recent Medical Services you may have received from other than  [facility] providers in the past year (date may be approximate).     Assessment:   This is a routine wellness examination for Jacqueline [name].  Hearing/Vision screen Hearing Screening - Comments:: Adequate hearing Vision Screening - Comments:: Adequate vision:wears contacts/glasses [facility]  Dietary issues and exercise activities discussed: Current Exercise Habits: Home exercise routine, Type of exercise: walking, Time (Minutes): 30, Frequency (Times/Week): 5, Weekly Exercise (Minutes/Week): 150, Intensity: Mild, Exercise limited by: None identified   Goals Addressed             This Visit's Progress    Track and Manage My Blood Pressure-Hypertension   On track    Timeframe:  Long-Range Goal Priority:  High Start Date: 12/21/2020                            Expected End Date: 06/22/2022                      Follow Up within 90 days   - check blood pressure weekly    Why is this important?   You won't feel high blood pressure, but it can still hurt your blood vessels.  High blood pressure can cause heart or kidney problems. It can also cause a stroke.  Making lifestyle changes like losing a little weight or eating less salt will help.  Checking your blood pressure at home and at different times of the day can help to control blood pressure.  If the doctor prescribes medicine remember to take it the way the doctor ordered.  Call the office if you cannot afford the medicine or if there are questions about it.     Notes:        Depression Screen    08/15/2022   11:32 AM 04/15/2022    8:30  AM 03/14/2022   12:48 PM 01/09/2022    1:25 PM 10/08/2021   11:13 AM 10/01/2021   10:10 AM 09/03/2021    1:46 PM  PHQ 2/9 Scores  PHQ - 2 Score 0 0 0 0 0 0 0  PHQ- 9 Score  0 0 0 0 0     Fall Risk    08/15/2022   11:28 AM 04/15/2022    8:30 AM 03/14/2022   12:48 PM 01/09/2022    1:25 PM 10/08/2021   11:12 AM  Fall Risk   Falls in the past year? 0 0 0 0 0  Number falls in past yr: 0 0 0 0 0  Injury  with Fall? 0 0 0 0 0  Risk for fall due to : No Fall Risks No Fall Risks No Fall Risks No Fall Risks No Fall Risks  Follow up Education provided;Falls prevention discussed Falls prevention discussed;Education provided Falls prevention discussed;Education provided Falls prevention discussed;Education provided Education provided;Falls prevention discussed    FALL RISK PREVENTION PERTAINING TO THE HOME:  Any stairs in or around the home? No  If so, are there any without handrails? No  Home free of loose throw rugs in walkways, pet beds, electrical cords, etc? Yes  Adequate lighting in your home to reduce risk of falls? Yes   ASSISTIVE DEVICES UTILIZED TO PREVENT FALLS:  Life alert? No  Use of a cane, walker or w/c? No  Grab bars in the bathroom? No  Shower chair or bench in shower? No  Elevated toilet seat or a handicapped toilet? No   TIMED UP AND GO:  Was the test performed? Yes .  Length of time to ambulate 10 feet: 10 sec.   Gait steady and fast without use of assistive device  Cognitive Function:        08/15/2022   11:49 AM 07/30/2019   10:25 AM  6CIT Screen  What Year? 0 points 0 points  What month? 0 points 0 points  What time? 0 points 0 points  Count back from 20 0 points 0 points  Months in reverse 0 points 0 points  Repeat phrase 0 points 0 points  Total Score 0 points 0 points    Immunizations Immunization History  Administered Date(s) Administered   Fluad Quad(high Dose 65+) 07/03/2020, 04/15/2022   Influenza,inj,Quad PF,6+ Mos 07/30/2018   Influenza-Unspecified 07/30/2018   PFIZER(Purple Top)SARS-COV-2 Vaccination 03/23/2020, 04/13/2020   PNEUMOCOCCAL CONJUGATE-20 10/08/2021   Pneumococcal Polysaccharide-23 07/03/2020   Td 12/27/1997, 11/27/2009   Td (Adult), 2 Lf Tetanus Toxid, Preservative Free 12/27/1997, 11/27/2009   Zoster, Live 10/27/2013    TDAP status: Up to date  Flu Vaccine status: Up to date  Pneumococcal vaccine status: Up to  date  Covid-19 vaccine status: Completed vaccines  Qualifies for Shingles Vaccine? Yes   Zostavax completed Yes   Shingrix Completed?: Yes  Screening Tests Health Maintenance  Topic Date Due   Zoster Vaccines- Shingrix (1 of 2) Never done   DTaP/Tdap/Td (4 - Tdap) 11/28/2019   COVID-19 Vaccine (3 - Pfizer risk series) 05/11/2020   COLONOSCOPY (Pts 45-1yr Insurance coverage will need to be confirmed)  08/14/2022   HEMOGLOBIN A1C  10/15/2022   OPHTHALMOLOGY EXAM  11/27/2022   DEXA SCAN  02/02/2023   MAMMOGRAM  03/29/2023   Diabetic kidney evaluation - eGFR measurement  04/16/2023   Diabetic kidney evaluation - Urine ACR  04/16/2023   FOOT EXAM  04/16/2023   Medicare Annual Wellness (AWV)  08/16/2023  Pneumonia Vaccine 56+ Years old  Completed   INFLUENZA VACCINE  Completed   Hepatitis C Screening  Completed   HPV VACCINES  Aged Out    Health Maintenance  Health Maintenance Due  Topic Date Due   Zoster Vaccines- Shingrix (1 of 2) Never done   DTaP/Tdap/Td (4 - Tdap) 11/28/2019   COVID-19 Vaccine (3 - Pfizer risk series) 05/11/2020   COLONOSCOPY (Pts 45-42yr Insurance coverage will need to be confirmed)  08/14/2022    Colorectal cancer screening: Type of screening: Colonoscopy. Completed due. Repeat every 10 years requests cologuard  Mammogram status: Completed yes. Repeat every year  Bone Density status: Completed yes. Results reflect: Bone density results: NORMAL. Repeat every 5 years.  Lung Cancer Screening: (Low Dose CT Chest recommended if Age 71-80years, 30 pack-year currently smoking OR have quit w/in 15years.) does not qualify.   Lung Cancer Screening Referral: no declines  Additional Screening:  Hepatitis C Screening: does not qualify; Completed no  Vision Screening: Recommended annual ophthalmology exams for early detection of glaucoma and other disorders of the eye. Is the patient up to date with their annual eye exam?  Yes March Who is the provider  or what is the name of the office in which the patient attends annual eye exams? [place] pt is not established with a provider, would they like to be referred to a provider to establish care? No .   Dental Screening: Recommended annual dental exams for proper oral hygiene no problems with teeth  Community Resource Referral / Chronic Care Management: CRR required this visit?  No   CCM required this visit?  No      Plan:     I have personally reviewed and noted the following in the patient's chart:   Medical and social history Use of alcohol, tobacco or illicit drugs  Current medications and supplements including opioid prescriptions. Patient is not currently taking opioid prescriptions. Functional ability and status Nutritional status Physical activity Advanced directives List of other physicians Hospitalizations, surgeries, and ER visits in previous 12 months Vitals Screenings to include cognitive, depression, and falls Referrals and appointments  In addition, I have reviewed and discussed with patient certain preventive protocols, quality metrics, and best practice recommendations. A written personalized care plan for preventive services as well as general preventive health recommendations were provided to patient.     BRoger Shelter LPN   20/03/3234  Nurse Notes: pt doing well. Is due for colonoscopy and prefers cologuard:order placed.

## 2022-08-15 NOTE — Patient Instructions (Signed)
Ms. Popiel , Thank you for taking time to come for your Medicare Wellness Visit. I appreciate your ongoing commitment to your health goals. Please review the following plan we discussed and let me know if I can assist you in the future.   These are the goals we discussed:  Goals      Track and Manage My Blood Pressure-Hypertension     Timeframe:  Long-Range Goal Priority:  High Start Date: 12/21/2020                            Expected End Date: 06/22/2022                      Follow Up within 90 days   - check blood pressure weekly    Why is this important?   You won't feel high blood pressure, but it can still hurt your blood vessels.  High blood pressure can cause heart or kidney problems. It can also cause a stroke.  Making lifestyle changes like losing a little weight or eating less salt will help.  Checking your blood pressure at home and at different times of the day can help to control blood pressure.  If the doctor prescribes medicine remember to take it the way the doctor ordered.  Call the office if you cannot afford the medicine or if there are questions about it.     Notes:      Weight (lb) < 200 lb (90.7 kg)     Pt would like to lose weight over the next year with healthy eating and physical activity         This is a list of the screening recommended for you and due dates:  Health Maintenance  Topic Date Due   Zoster (Shingles) Vaccine (1 of 2) Never done   DTaP/Tdap/Td vaccine (4 - Tdap) 11/28/2019   COVID-19 Vaccine (3 - Pfizer risk series) 05/11/2020   Colon Cancer Screening  08/14/2022   Hemoglobin A1C  10/15/2022   Eye exam for diabetics  11/27/2022   DEXA scan (bone density measurement)  02/02/2023   Mammogram  03/29/2023   Yearly kidney function blood test for diabetes  04/16/2023   Yearly kidney health urinalysis for diabetes  04/16/2023   Complete foot exam   04/16/2023   Medicare Annual Wellness Visit  08/16/2023   Pneumonia Vaccine  Completed    Flu Shot  Completed   Hepatitis C Screening: USPSTF Recommendation to screen - Ages 18-79 yo.  Completed   HPV Vaccine  Aged Out    Advanced directives: no:in process  Conditions/risks identified: none  Next appointment: Follow up in one year for your annual wellness visit 08/21/2023 '@0845'$  in-person   Preventive Care 65 Years and Older, Female Preventive care refers to lifestyle choices and visits with your health care provider that can promote health and wellness. What does preventive care include? A yearly physical exam. This is also called an annual well check. Dental exams once or twice a year. Routine eye exams. Ask your health care provider how often you should have your eyes checked. Personal lifestyle choices, including: Daily care of your teeth and gums. Regular physical activity. Eating a healthy diet. Avoiding tobacco and drug use. Limiting alcohol use. Practicing safe sex. Taking low-dose aspirin every day. Taking vitamin and mineral supplements as recommended by your health care provider. What happens during an annual well check? The services and screenings  done by your health care provider during your annual well check will depend on your age, overall health, lifestyle risk factors, and family history of disease. Counseling  Your health care provider may ask you questions about your: Alcohol use. Tobacco use. Drug use. Emotional well-being. Home and relationship well-being. Sexual activity. Eating habits. History of falls. Memory and ability to understand (cognition). Work and work Statistician. Reproductive health. Screening  You may have the following tests or measurements: Height, weight, and BMI. Blood pressure. Lipid and cholesterol levels. These may be checked every 5 years, or more frequently if you are over 12 years old. Skin check. Lung cancer screening. You may have this screening every year starting at age 102 if you have a 30-pack-year history  of smoking and currently smoke or have quit within the past 15 years. Fecal occult blood test (FOBT) of the stool. You may have this test every year starting at age 54. Flexible sigmoidoscopy or colonoscopy. You may have a sigmoidoscopy every 5 years or a colonoscopy every 10 years starting at age 83. Hepatitis C blood test. Hepatitis B blood test. Sexually transmitted disease (STD) testing. Diabetes screening. This is done by checking your blood sugar (glucose) after you have not eaten for a while (fasting). You may have this done every 1-3 years. Bone density scan. This is done to screen for osteoporosis. You may have this done starting at age 71. Mammogram. This may be done every 1-2 years. Talk to your health care provider about how often you should have regular mammograms. Talk with your health care provider about your test results, treatment options, and if necessary, the need for more tests. Vaccines  Your health care provider may recommend certain vaccines, such as: Influenza vaccine. This is recommended every year. Tetanus, diphtheria, and acellular pertussis (Tdap, Td) vaccine. You may need a Td booster every 10 years. Zoster vaccine. You may need this after age 23. Pneumococcal 13-valent conjugate (PCV13) vaccine. One dose is recommended after age 29. Pneumococcal polysaccharide (PPSV23) vaccine. One dose is recommended after age 5. Talk to your health care provider about which screenings and vaccines you need and how often you need them. This information is not intended to replace advice given to you by your health care provider. Make sure you discuss any questions you have with your health care provider. Document Released: 07/21/2015 Document Revised: 03/13/2016 Document Reviewed: 04/25/2015 Elsevier Interactive Patient Education  2017 [place] Prevention in the Home Falls can cause injuries. They can happen to people of all ages. There are many things you can do to  make your home safe and to help prevent falls. What can I do on the outside of my home? Regularly fix the edges of walkways and driveways and fix any cracks. Remove anything that might make you trip as you walk through a door, such as a raised step or threshold. Trim any bushes or trees on the path to your home. Use bright outdoor lighting. Clear any walking paths of anything that might make someone trip, such as rocks or tools. Regularly check to see if handrails are loose or broken. Make sure that both sides of any steps have handrails. Any raised decks and porches should have guardrails on the edges. Have any leaves, snow, or ice cleared regularly. Use sand or salt on walking paths during winter. Clean up any spills in your garage right away. This includes oil or grease spills. What can I do in the bathroom? Use night lights.  Install grab bars by the toilet and in the tub and shower. Do not use towel bars as grab bars. Use non-skid mats or decals in the tub or shower. If you need to sit down in the shower, use a plastic, non-slip stool. Keep the floor dry. Clean up any water that spills on the floor as soon as it happens. Remove soap buildup in the tub or shower regularly. Attach bath mats securely with double-sided non-slip rug tape. Do not have throw rugs and other things on the floor that can make you trip. What can I do in the bedroom? Use night lights. Make sure that you have a light by your bed that is easy to reach. Do not use any sheets or blankets that are too big for your bed. They should not hang down onto the floor. Have a firm chair that has side arms. You can use this for support while you get dressed. Do not have throw rugs and other things on the floor that can make you trip. What can I do in the kitchen? Clean up any spills right away. Avoid walking on wet floors. Keep items that you use a lot in easy-to-reach places. If you need to reach something above you, use a  strong step stool that has a grab bar. Keep electrical cords out of the way. Do not use floor polish or wax that makes floors slippery. If you must use wax, use non-skid floor wax. Do not have throw rugs and other things on the floor that can make you trip. What can I do with my stairs? Do not leave any items on the stairs. Make sure that there are handrails on both sides of the stairs and use them. Fix handrails that are broken or loose. Make sure that handrails are as long as the stairways. Check any carpeting to make sure that it is firmly attached to the stairs. Fix any carpet that is loose or worn. Avoid having throw rugs at the top or bottom of the stairs. If you do have throw rugs, attach them to the floor with carpet tape. Make sure that you have a light switch at the top of the stairs and the bottom of the stairs. If you do not have them, ask someone to add them for you. What else can I do to help prevent falls? Wear shoes that: Do not have high heels. Have rubber bottoms. Are comfortable and fit you well. Are closed at the toe. Do not wear sandals. If you use a stepladder: Make sure that it is fully opened. Do not climb a closed stepladder. Make sure that both sides of the stepladder are locked into place. Ask someone to hold it for you, if possible. Clearly mark and make sure that you can see: Any grab bars or handrails. First and last steps. Where the edge of each step is. Use tools that help you move around (mobility aids) if they are needed. These include: Canes. Walkers. Scooters. Crutches. Turn on the lights when you go into a dark area. Replace any light bulbs as soon as they burn out. Set up your furniture so you have a clear path. Avoid moving your furniture around. If any of your floors are uneven, fix them. If there are any pets around you, be aware of where they are. Review your medicines with your doctor. Some medicines can make you feel dizzy. This can  increase your chance of falling. Ask your doctor what other things that you  can do to help prevent falls. This information is not intended to replace advice given to you by your health care provider. Make sure you discuss any questions you have with your health care provider. Document Released: 04/20/2009 Document Revised: 11/30/2015 Document Reviewed: 07/29/2014 Elsevier Interactive Patient Education  2017 [employer].

## 2022-08-21 ENCOUNTER — Other Ambulatory Visit: Payer: Self-pay | Admitting: Cardiovascular Disease

## 2022-08-21 DIAGNOSIS — E785 Hyperlipidemia, unspecified: Secondary | ICD-10-CM

## 2022-08-21 DIAGNOSIS — I700 Atherosclerosis of aorta: Secondary | ICD-10-CM

## 2022-08-27 NOTE — Progress Notes (Signed)
Pt was in office for this visit.

## 2022-09-05 ENCOUNTER — Ambulatory Visit (INDEPENDENT_AMBULATORY_CARE_PROVIDER_SITE_OTHER): Payer: Medicare HMO

## 2022-09-05 DIAGNOSIS — E538 Deficiency of other specified B group vitamins: Secondary | ICD-10-CM | POA: Diagnosis not present

## 2022-09-05 MED ORDER — CYANOCOBALAMIN 1000 MCG/ML IJ SOLN
1000.0000 ug | Freq: Once | INTRAMUSCULAR | Status: AC
  Administered 2022-09-05: 1000 ug via INTRAMUSCULAR

## 2022-09-06 ENCOUNTER — Encounter: Payer: Self-pay | Admitting: Gastroenterology

## 2022-10-04 ENCOUNTER — Ambulatory Visit (INDEPENDENT_AMBULATORY_CARE_PROVIDER_SITE_OTHER): Payer: Medicare HMO

## 2022-10-04 DIAGNOSIS — E538 Deficiency of other specified B group vitamins: Secondary | ICD-10-CM | POA: Diagnosis not present

## 2022-10-04 MED ORDER — CYANOCOBALAMIN 1000 MCG/ML IJ SOLN
1000.0000 ug | Freq: Once | INTRAMUSCULAR | Status: AC
  Administered 2022-10-04: 1000 ug via INTRAMUSCULAR

## 2022-10-09 ENCOUNTER — Other Ambulatory Visit: Payer: Self-pay | Admitting: Family Medicine

## 2022-10-09 DIAGNOSIS — I10 Essential (primary) hypertension: Secondary | ICD-10-CM

## 2022-10-09 DIAGNOSIS — G4700 Insomnia, unspecified: Secondary | ICD-10-CM

## 2022-10-09 NOTE — Telephone Encounter (Signed)
Pt will need appt asap only 1 month given for refills

## 2022-10-15 ENCOUNTER — Encounter: Payer: Medicare HMO | Admitting: Dermatology

## 2022-10-31 ENCOUNTER — Other Ambulatory Visit: Payer: Self-pay | Admitting: Cardiovascular Disease

## 2022-11-01 ENCOUNTER — Ambulatory Visit (INDEPENDENT_AMBULATORY_CARE_PROVIDER_SITE_OTHER): Payer: Medicare HMO

## 2022-11-01 DIAGNOSIS — E538 Deficiency of other specified B group vitamins: Secondary | ICD-10-CM | POA: Diagnosis not present

## 2022-11-01 MED ORDER — CYANOCOBALAMIN 1000 MCG/ML IJ SOLN
1000.0000 ug | Freq: Once | INTRAMUSCULAR | Status: AC
  Administered 2022-11-01: 1000 ug via INTRAMUSCULAR

## 2022-11-07 ENCOUNTER — Other Ambulatory Visit: Payer: Self-pay | Admitting: Family Medicine

## 2022-11-07 DIAGNOSIS — G4700 Insomnia, unspecified: Secondary | ICD-10-CM

## 2022-11-07 DIAGNOSIS — I10 Essential (primary) hypertension: Secondary | ICD-10-CM

## 2022-11-07 NOTE — Telephone Encounter (Signed)
Pt needs a f/u last 30 day refill on meds

## 2022-11-11 ENCOUNTER — Ambulatory Visit (INDEPENDENT_AMBULATORY_CARE_PROVIDER_SITE_OTHER): Payer: Medicare HMO | Admitting: Dermatology

## 2022-11-11 VITALS — BP 127/74 | HR 78

## 2022-11-11 DIAGNOSIS — L570 Actinic keratosis: Secondary | ICD-10-CM | POA: Diagnosis not present

## 2022-11-11 DIAGNOSIS — D229 Melanocytic nevi, unspecified: Secondary | ICD-10-CM

## 2022-11-11 DIAGNOSIS — L814 Other melanin hyperpigmentation: Secondary | ICD-10-CM | POA: Diagnosis not present

## 2022-11-11 DIAGNOSIS — Z1283 Encounter for screening for malignant neoplasm of skin: Secondary | ICD-10-CM

## 2022-11-11 DIAGNOSIS — L821 Other seborrheic keratosis: Secondary | ICD-10-CM

## 2022-11-11 DIAGNOSIS — L578 Other skin changes due to chronic exposure to nonionizing radiation: Secondary | ICD-10-CM | POA: Diagnosis not present

## 2022-11-11 NOTE — Progress Notes (Signed)
Follow-Up Visit   Subjective  Jacqueline [name] is a 72 y.o. female who presents for the following: Skin Cancer Screening and Full Body Skin Exam  hx of aks and isks.   Reports some spots on right ear, right hand, and at upper chest she would like checked.   The patient presents for Total-Body Skin Exam (TBSE) for skin cancer screening and mole check. The patient has spots, moles and lesions to be evaluated, some may be new or changing and the patient has concerns that these could be cancer.    The following portions of the chart were reviewed this encounter and updated as appropriate: medications, allergies, medical history  Review of Systems:  No other skin or systemic complaints except as noted in HPI or Assessment and Plan.  Objective  Well appearing patient in no apparent distress; mood and affect are within normal limits.  A full examination was performed including scalp, head, eyes, ears, nose, lips, neck, chest, axillae, abdomen, back, buttocks, bilateral upper extremities, bilateral lower extremities, hands, feet, fingers, toes, fingernails, and toenails. All findings within normal limits unless otherwise noted below.   Relevant physical exam findings are noted in the Assessment and Plan.    right hand dorsum x 1, posterior crown at part x 1, left clavicle x 1 (3) Keratotic macules and papules    Assessment & Plan   LENTIGINES, SEBORRHEIC KERATOSES (right ear mid helix and at chest), HEMANGIOMAS - Benign normal skin lesions - Benign-appearing - Call for any changes   Seborrheic keratosis vs Epidermal Nevus  Exam: Left Flank- Irregular 4 x 2.5cm brown waxy plaque, present since childhood per patient. New photo today see above   Benign-appearing.  Observation.  Call clinic for new or changing lesions.  Recommend daily use of broad spectrum spf 30+ sunscreen to sun-exposed areas.     MELANOCYTIC NEVI - Tan-brown and/or pink-flesh-colored symmetric macules and  papules - Benign appearing on exam today - Observation - Call clinic for new or changing moles - Recommend daily use of broad spectrum spf 30+ sunscreen to sun-exposed areas.   ACTINIC DAMAGE - Chronic condition, secondary to cumulative UV/sun exposure - diffuse scaly erythematous macules with underlying dyspigmentation - Recommend daily broad spectrum sunscreen SPF 30+ to sun-exposed areas, reapply every 2 hours as needed.  - Staying in the shade or wearing long sleeves, sun glasses (UVA+UVB protection) and wide brim hats (4-inch brim around the entire circumference of the hat) are also recommended for sun protection.  - Call for new or changing lesions.  SKIN CANCER SCREENING PERFORMED TODAY.    Actinic keratosis (3) right hand dorsum x 1, posterior crown at part x 1, left clavicle x 1  Hypertrophic AK Vs ISKs    Actinic keratoses are precancerous spots that appear secondary to cumulative UV radiation exposure/sun exposure over time. They are chronic with expected duration over 1 year. A portion of actinic keratoses will progress to squamous cell carcinoma of the skin. It is not possible to reliably predict which spots will progress to skin cancer and so treatment is recommended to prevent development of skin cancer.  Recommend daily broad spectrum sunscreen SPF 30+ to sun-exposed areas, reapply every 2 hours as needed.  Recommend staying in the shade or wearing long sleeves, sun glasses (UVA+UVB protection) and wide brim hats (4-inch brim around the entire circumference of the hat). Call for new or changing lesions.  Destruction of lesion - right hand dorsum x 1, posterior crown at part  x 1, left clavicle x 1  Destruction method: cryotherapy   Informed consent: discussed and consent obtained   Lesion destroyed using liquid nitrogen: Yes   Region frozen until ice ball extended beyond lesion: Yes   Outcome: patient tolerated procedure well with no complications   Post-procedure  details: wound care instructions given   Additional details:  Prior to procedure, discussed risks of blister formation, small wound, skin dyspigmentation, or rare scar following cryotherapy. Recommend Vaseline ointment to treated areas while healing.    Return in about 1 year (around 11/11/2023) for TBSE.  I, Asher Muir, CMA, am acting as scribe for Willeen Niece, MD.   Documentation: I have reviewed the above documentation for accuracy and completeness, and I agree with the above.  Willeen Niece, MD

## 2022-11-11 NOTE — Patient Instructions (Addendum)
Cryotherapy Aftercare  Wash gently with soap and water everyday.   Apply Vaseline and Band-Aid daily until healed.     Actinic keratoses are precancerous spots that appear secondary to cumulative UV radiation exposure/sun exposure over time. They are chronic with expected duration over 1 year. A portion of actinic keratoses will progress to squamous cell carcinoma of the skin. It is not possible to reliably predict which spots will progress to skin cancer and so treatment is recommended to prevent development of skin cancer.  Recommend daily broad spectrum sunscreen SPF 30+ to sun-exposed areas, reapply every 2 hours as needed.  Recommend staying in the shade or wearing long sleeves, sun glasses (UVA+UVB protection) and wide brim hats (4-inch brim around the entire circumference of the hat). Call for new or changing lesions.     Seborrheic Keratosis  What causes seborrheic keratoses? Seborrheic keratoses are harmless, common skin growths that first appear during adult life.  As time goes by, more growths appear.  Some people may develop a large number of them.  Seborrheic keratoses appear on both covered and uncovered body parts.  They are not caused by sunlight.  The tendency to develop seborrheic keratoses can be inherited.  They vary in color from skin-colored to gray, brown, or even black.  They can be either smooth or have a rough, warty surface.   Seborrheic keratoses are superficial and look as if they were stuck on the skin.  Under the microscope this type of keratosis looks like layers upon layers of skin.  That is why at times the top layer may seem to fall off, but the rest of the growth remains and re-grows.    Treatment Seborrheic keratoses do not need to be treated, but can easily be removed in the office.  Seborrheic keratoses often cause symptoms when they rub on clothing or jewelry.  Lesions can be in the way of shaving.  If they become inflamed, they can cause itching,  soreness, or burning.  Removal of a seborrheic keratosis can be accomplished by freezing, burning, or surgery. If any spot bleeds, scabs, or grows rapidly, please return to have it checked, as these can be an indication of a skin cancer.     Melanoma ABCDEs  Melanoma is the most dangerous type of skin cancer, and is the leading cause of death from skin disease.  You are more likely to develop melanoma if you: Have light-colored skin, light-colored eyes, or red or blond hair Spend a lot of time in the sun Tan regularly, either outdoors or in a tanning bed Have had blistering sunburns, especially during childhood Have a close family member who has had a melanoma Have atypical moles or large birthmarks  Early detection of melanoma is key since treatment is typically straightforward and cure rates are extremely high if we catch it early.   The first sign of melanoma is often a change in a mole or a new dark spot.  The ABCDE system is a way of remembering the signs of melanoma.  A for asymmetry:  The two halves do not match. B for border:  The edges of the growth are irregular. C for color:  A mixture of colors are present instead of an even brown color. D for diameter:  Melanomas are usually (but not always) greater than 6mm - the size of a pencil eraser. E for evolution:  The spot keeps changing in size, shape, and color.  Please check your skin once per month between   visits. You can use a small mirror in front and a large mirror behind you to keep an eye on the back side or your body.   If you see any new or changing lesions before your next follow-up, please call to schedule a visit.  Please continue daily skin protection including broad spectrum sunscreen SPF 30+ to sun-exposed areas, reapplying every 2 hours as needed when you're outdoors.   Staying in the shade or wearing long sleeves, sun glasses (UVA+UVB protection) and wide brim hats (4-inch brim around the entire circumference  of the hat) are also recommended for sun protection.    Due to recent changes in healthcare laws, you may see results of your pathology and/or laboratory studies on MyChart before the doctors have had a chance to review them. We understand that in some cases there may be results that are confusing or concerning to you. Please understand that not all results are received at the same time and often the doctors may need to interpret multiple results in order to provide you with the best plan of care or course of treatment. Therefore, we ask that you please give us 2 business days to thoroughly review all your results before contacting the office for clarification. Should we see a critical lab result, you will be contacted sooner.   If You Need Anything After Your Visit  If you have any questions or concerns for your doctor, please call our main line at [phone] and press option 4 to reach your doctor's medical assistant. If no one answers, please leave a voicemail as directed and we will return your call as soon as possible. Messages left after 4 pm will be answered the following business day.   You may also send us a message via MyChart. We typically respond to MyChart messages within 1-2 business days.  For prescription refills, please ask your pharmacy to contact our office. Our fax number is [phone].  If you have an urgent issue when the clinic is closed that cannot wait until the next business day, you can page your doctor at the number below.    Please note that while we do our best to be available for urgent issues outside of office hours, we are not available 24/7.   If you have an urgent issue and are unable to reach us, you may choose to seek medical care at your doctor's office, retail clinic, urgent care center, or emergency room.  If you have a medical emergency, please immediately call 911 or go to the emergency department.  Pager Numbers  - Dr. Kowalski: [phone]  -  Dr. Moye: [phone]  - Dr. Stewart: [phone]  In the event of inclement weather, please call our main line at [phone] for an update on the status of any delays or closures.  Dermatology Medication Tips: Please keep the boxes that topical medications come in in order to help keep track of the instructions about where and how to use these. Pharmacies typically print the medication instructions only on the boxes and not directly on the medication tubes.   If your medication is too expensive, please contact our office at [phone] option 4 or send us a message through MyChart.   We are unable to tell what your co-pay for medications will be in advance as this is different depending on your insurance coverage. However, we may be able to find a substitute medication at lower cost or fill out paperwork to get insurance to cover a needed medication.     If a prior authorization is required to get your medication covered by your insurance company, please allow us 1-2 business days to complete this process.  Drug prices often vary depending on where the prescription is filled and some pharmacies may offer cheaper prices.  The website www.goodrx.com contains coupons for medications through different pharmacies. The prices here do not account for what the cost may be with help from insurance (it may be cheaper with your insurance), but the website can give you the price if you did not use any insurance.  - You can print the associated coupon and take it with your prescription to the pharmacy.  - You may also stop by our office during regular business hours and pick up a GoodRx coupon card.  - If you need your prescription sent electronically to a different pharmacy, notify our office through [place] MyChart or by phone at [phone] option 4.     Si Usted Necesita Algo Despus de Su Visita  Tambin puede enviarnos un mensaje a travs de MyChart. Por lo general respondemos a los  mensajes de MyChart en el transcurso de 1 a 2 das hbiles.  Para renovar recetas, por favor pida a su farmacia que se ponga en contacto con nuestra oficina. Nuestro nmero de fax es el [phone].  Si tiene un asunto urgente cuando la clnica est cerrada y que no puede esperar hasta el siguiente da hbil, puede llamar/localizar a su doctor(a) al nmero que aparece a continuacin.   Por favor, tenga en cuenta que aunque hacemos todo lo posible para estar disponibles para asuntos urgentes fuera del horario de oficina, no estamos disponibles las 24 horas del da, los 7 das de la semana.   Si tiene un problema urgente y no puede comunicarse con nosotros, puede optar por buscar atencin mdica  en el consultorio de su doctor(a), en una clnica privada, en un centro de atencin urgente o en una sala de emergencias.  Si tiene una emergencia mdica, por favor llame inmediatamente al 911 o vaya a la sala de emergencias.  Nmeros de bper  - Dr. Kowalski: [phone]  - Dra. Moye: [phone]  - Dra. Stewart: [phone]  En caso de inclemencias del tiempo, por favor llame a nuestra lnea principal al [phone] para una actualizacin sobre el estado de cualquier retraso o cierre.  Consejos para la medicacin en dermatologa: Por favor, guarde las cajas en las que vienen los medicamentos de uso tpico para ayudarle a seguir las instrucciones sobre dnde y cmo usarlos. Las farmacias generalmente imprimen las instrucciones del medicamento slo en las cajas y no directamente en los tubos del medicamento.   Si su medicamento es muy caro, por favor, pngase en contacto con nuestra oficina llamando al [phone] y presione la opcin 4 o envenos un mensaje a travs de MyChart.   No podemos decirle cul ser su copago por los medicamentos por adelantado ya que esto es diferente dependiendo de la cobertura de su seguro. Sin embargo, es posible que podamos encontrar un medicamento sustituto a  menor costo o llenar un formulario para que el seguro cubra el medicamento que se considera necesario.   Si se requiere una autorizacin previa para que su compaa de seguros cubra su medicamento, por favor permtanos de 1 a 2 das hbiles para completar este proceso.  Los precios de los medicamentos varan con frecuencia dependiendo del lugar de dnde se surte la receta y alguna farmacias pueden ofrecer precios ms baratos.  El sitio web www.goodrx.com   tiene cupones para medicamentos de diferentes farmacias. Los precios aqu no tienen en cuenta lo que podra costar con la ayuda del seguro (puede ser ms barato con su seguro), pero el sitio web puede darle el precio si no utiliz ningn seguro.  - Puede imprimir el cupn correspondiente y llevarlo con su receta a la farmacia.  - Tambin puede pasar por nuestra oficina durante el horario de atencin regular y recoger una tarjeta de cupones de GoodRx.  - Si necesita que su receta se enve electrnicamente a una farmacia diferente, informe a nuestra oficina a travs de MyChart de [facility] o por telfono llamando al [phone] y presione la opcin 4.  

## 2022-11-17 ENCOUNTER — Other Ambulatory Visit: Payer: Self-pay | Admitting: Cardiovascular Disease

## 2022-11-17 DIAGNOSIS — I700 Atherosclerosis of aorta: Secondary | ICD-10-CM

## 2022-11-17 DIAGNOSIS — E785 Hyperlipidemia, unspecified: Secondary | ICD-10-CM

## 2022-11-30 DIAGNOSIS — J0140 Acute pansinusitis, unspecified: Secondary | ICD-10-CM | POA: Diagnosis not present

## 2022-12-03 ENCOUNTER — Ambulatory Visit: Payer: Medicare HMO | Admitting: Family Medicine

## 2022-12-05 ENCOUNTER — Ambulatory Visit (INDEPENDENT_AMBULATORY_CARE_PROVIDER_SITE_OTHER): Payer: Medicare HMO | Admitting: Family Medicine

## 2022-12-05 ENCOUNTER — Encounter: Payer: Self-pay | Admitting: Family Medicine

## 2022-12-05 VITALS — BP 126/74 | HR 71 | Temp 97.5°F | Resp 16 | Ht 64.0 in | Wt 233.3 lb

## 2022-12-05 DIAGNOSIS — I479 Paroxysmal tachycardia, unspecified: Secondary | ICD-10-CM

## 2022-12-05 DIAGNOSIS — N1831 Chronic kidney disease, stage 3a: Secondary | ICD-10-CM | POA: Diagnosis not present

## 2022-12-05 DIAGNOSIS — E1142 Type 2 diabetes mellitus with diabetic polyneuropathy: Secondary | ICD-10-CM | POA: Diagnosis not present

## 2022-12-05 DIAGNOSIS — Z1231 Encounter for screening mammogram for malignant neoplasm of breast: Secondary | ICD-10-CM

## 2022-12-05 DIAGNOSIS — E538 Deficiency of other specified B group vitamins: Secondary | ICD-10-CM

## 2022-12-05 DIAGNOSIS — R600 Localized edema: Secondary | ICD-10-CM

## 2022-12-05 DIAGNOSIS — E785 Hyperlipidemia, unspecified: Secondary | ICD-10-CM | POA: Diagnosis not present

## 2022-12-05 DIAGNOSIS — Z780 Asymptomatic menopausal state: Secondary | ICD-10-CM

## 2022-12-05 DIAGNOSIS — Z1211 Encounter for screening for malignant neoplasm of colon: Secondary | ICD-10-CM

## 2022-12-05 DIAGNOSIS — G629 Polyneuropathy, unspecified: Secondary | ICD-10-CM

## 2022-12-05 DIAGNOSIS — Z5181 Encounter for therapeutic drug level monitoring: Secondary | ICD-10-CM | POA: Diagnosis not present

## 2022-12-05 DIAGNOSIS — I700 Atherosclerosis of aorta: Secondary | ICD-10-CM | POA: Diagnosis not present

## 2022-12-05 DIAGNOSIS — E66813 Obesity, class 3: Secondary | ICD-10-CM

## 2022-12-05 DIAGNOSIS — Z6839 Body mass index (BMI) 39.0-39.9, adult: Secondary | ICD-10-CM | POA: Diagnosis not present

## 2022-12-05 DIAGNOSIS — E66812 Obesity, class 2: Secondary | ICD-10-CM

## 2022-12-05 DIAGNOSIS — R197 Diarrhea, unspecified: Secondary | ICD-10-CM

## 2022-12-05 DIAGNOSIS — I10 Essential (primary) hypertension: Secondary | ICD-10-CM

## 2022-12-05 DIAGNOSIS — G4700 Insomnia, unspecified: Secondary | ICD-10-CM

## 2022-12-05 LAB — CBC WITH DIFFERENTIAL/PLATELET
Absolute Monocytes: 1056 cells/uL — ABNORMAL HIGH (ref 200–950)
Basophils Relative: 1 %
HCT: 43.8 % (ref 35.0–45.0)
MCHC: 33.1 g/dL (ref 32.0–36.0)
MCV: 92.4 fL (ref 80.0–100.0)
Monocytes Relative: 9.1 %
Total Lymphocyte: 23.4 %

## 2022-12-05 MED ORDER — TRAZODONE HCL 50 MG PO TABS: ORAL_TABLET | ORAL | 11 refills | Status: DC

## 2022-12-05 MED ORDER — LISINOPRIL 10 MG PO TABS: 10.0000 mg | ORAL_TABLET | Freq: Every day | ORAL | 5 refills | Status: DC

## 2022-12-05 MED ORDER — CYANOCOBALAMIN 1000 MCG/ML IJ SOLN
1000.0000 ug | Freq: Once | INTRAMUSCULAR | Status: AC
Start: 2022-12-05 — End: 2022-12-05
  Administered 2022-12-05: 1000 ug via INTRAMUSCULAR

## 2022-12-05 NOTE — Assessment & Plan Note (Signed)
T2DM which has been improved and controlled with diet changes only not currently on meds Due for recheck of labs, DM eye exam Recent pertinent labs: Lab Results  Component Value Date   HGBA1C 6.7 (H) 04/15/2022   HGBA1C 6.5 (H) 10/08/2021   HGBA1C 6.1 (H) 03/02/2021   Standard of care and health maintenance: Urine Microalbumin:  UTD due in Oct Foot exam:  UTD due in OCt DM eye exam:  DUE ACEI/ARB:  yes Statin:  yes

## 2022-12-05 NOTE — Assessment & Plan Note (Signed)
Doing monthly B12 injections which has helped with managing neuropathy sx and kept B12 at adequate level When off injections and doing oral b12 she returns to defieicnt levers and has more sx - so we will keep her on monthly b12

## 2022-12-05 NOTE — Assessment & Plan Note (Signed)
Multiple associated comorbidities including CHF, hyperlipidemia, hypertension Weight has been stable over the last 6 months, she states she is exercising a lot and has been cutting back on foods and calorie intake but she has been unable to lose weight Wt Readings from Last 5 Encounters:  12/05/22 233 lb 4.8 oz (105.8 kg)  08/15/22 233 lb 11.2 oz (106 kg)  07/11/22 235 lb (106.6 kg)  04/15/22 229 lb 3.2 oz (104 kg)  01/09/22 227 lb 6.4 oz (103.1 kg)   BMI Readings from Last 5 Encounters:  12/05/22 40.05 kg/m  08/15/22 40.11 kg/m  07/11/22 40.34 kg/m  04/15/22 39.34 kg/m  01/09/22 39.03 kg/m

## 2022-12-05 NOTE — Assessment & Plan Note (Signed)
Managed by cardiology on metoprolol 25 mg BID

## 2022-12-05 NOTE — Patient Instructions (Addendum)
Please complete the cologuard test or let us know if you would like to do a colonoscopy instead. Health Maintenance  Topic Date Due   Zoster (Shingles) Vaccine (1 of 2) Never done   Cologuard (Stool DNA test)  Never done   DTaP/Tdap/Td vaccine (4 - Tdap) 11/28/2019   COVID-19 Vaccine (3 - Pfizer risk series) 05/11/2020   Hemoglobin A1C  10/15/2022   Colon Cancer Screening  12/04/2022   Eye exam for diabetics  11/27/2022   DEXA scan (bone density measurement)  02/02/2023   Flu Shot  02/06/2023   Mammogram  03/29/2023   Yearly kidney function blood test for diabetes  04/16/2023   Yearly kidney health urinalysis for diabetes  04/16/2023   Complete foot exam   04/16/2023   Medicare Annual Wellness Visit  08/16/2023   Pneumonia Vaccine  Completed   Hepatitis C Screening  Completed   HPV Vaccine  Aged Out   You are due for bone density in July and routine mammogram f/up in Sept - I suggest you call [facility] and see if you can schedule both in last September.  I have put in both orders today [facility] [address], [place], [place] [address] Scheduling phone #: [phone]   Low-FODMAP Eating Plan  FODMAP stands for fermentable oligosaccharides, disaccharides, monosaccharides, and polyols. These are sugars that are hard for some people to digest. A low-FODMAP eating plan may help some people who have irritable bowel syndrome (IBS) and certain other bowel (intestinal) diseases to manage their symptoms. This meal plan can be complicated to follow. Work with a diet and nutrition specialist (dietitian) to make a low-FODMAP eating plan that is right for you. A dietitian can help make sure that you get enough nutrition from this diet. What are tips for following this plan? Reading food labels Check labels for hidden FODMAPs such as: High-fructose syrup. Honey. Agave. Natural fruit flavors. Onion or garlic powder. Choose low-FODMAP foods that  contain 3-4 grams of fiber per serving. Check food labels for serving sizes. Eat only one serving at a time to make sure FODMAP levels stay low. Shopping Shop with a list of foods that are recommended on this diet and make a meal plan. Meal planning Follow a low-FODMAP eating plan for up to 6 weeks, or as told by your health care provider or dietitian. To follow the eating plan: Eliminate high-FODMAP foods from your diet completely. Choose only low-FODMAP foods to eat. You will do this for 2-6 weeks. Gradually reintroduce high-FODMAP foods into your diet one at a time. Most people should wait a few days before introducing the next new high-FODMAP food into their meal plan. Your dietitian can recommend how quickly you may reintroduce foods. Keep a daily record of what and how much you eat and drink. Make note of any symptoms that you have after eating. Review your daily record with a dietitian regularly to identify which foods you can eat and which foods you should avoid. General tips Drink enough fluid each day to keep your urine pale yellow. Avoid processed foods. These often have added sugar and may be high in FODMAPs. Avoid most dairy products, whole grains, and sweeteners. Work with a dietitian to make sure you get enough fiber in your diet. Avoid high FODMAP foods at meals to manage symptoms. Recommended foods Fruits Bananas, oranges, tangerines, lemons, limes, blueberries, raspberries, strawberries, grapes, cantaloupe, honeydew melon, kiwi, papaya, passion fruit, and pineapple. Limited amounts of dried  cranberries, banana chips, and shredded coconut. Vegetables Eggplant, zucchini, cucumber, peppers, green beans, bean sprouts, lettuce, arugula, kale, Swiss chard, spinach, collard greens, bok choy, summer squash, potato, and tomato. Limited amounts of corn, carrot, and sweet potato. Green parts of scallions. Grains Gluten-free grains, such as rice, oats, buckwheat, quinoa, corn, polenta,  and millet. Gluten-free pasta, bread, or cereal. Rice noodles. Corn tortillas. Meats and other proteins Unseasoned beef, pork, poultry, or fish. Eggs. Tomasa Blase. Tofu (firm) and tempeh. Limited amounts of nuts and seeds, such as almonds, walnuts, [place] nuts, pecans, peanuts, nut butters, pumpkin seeds, chia seeds, and sunflower seeds. Dairy Lactose-free milk, yogurt, and kefir. Lactose-free cottage cheese and ice cream. Non-dairy milks, such as almond, coconut, hemp, and rice milk. Non-dairy yogurt. Limited amounts of goat cheese, brie, mozzarella, parmesan, swiss, and other hard cheeses. Fats and oils Butter-free spreads. Vegetable oils, such as olive, canola, and sunflower oil. Seasoning and other foods Artificial sweeteners with names that do not end in "ol," such as aspartame, saccharine, and stevia. Maple syrup, white table sugar, raw sugar, brown sugar, and molasses. Mayonnaise, soy sauce, and tamari. Fresh basil, coriander, parsley, rosemary, and thyme. Beverages Water and mineral water. Sugar-sweetened soft drinks. Small amounts of orange juice or cranberry juice. Black and green tea. Most dry wines. Coffee. The items listed above may not be a complete list of foods and beverages you can eat. Contact a dietitian for more information. Foods to avoid Fruits Fresh, dried, and juiced forms of apple, pear, watermelon, peach, plum, cherries, apricots, blackberries, boysenberries, figs, nectarines, and mango. Avocado. Vegetables Chicory root, artichoke, asparagus, cabbage, snow peas, Brussels sprouts, broccoli, sugar snap peas, mushrooms, celery, and cauliflower. Onions, garlic, leeks, and the white part of scallions. Grains Wheat, including kamut, durum, and semolina. Barley and bulgur. Couscous. Wheat-based cereals. Wheat noodles, bread, crackers, and pastries. Meats and other proteins Fried or fatty meat. Sausage. Cashews and pistachios. Soybeans, baked beans, black beans, chickpeas, kidney  beans, fava beans, navy beans, lentils, black-eyed peas, and split peas. Dairy Milk, yogurt, ice cream, and soft cheese. Cream and sour cream. Milk-based sauces. Custard. Buttermilk. Soy milk. Seasoning and other foods Any sugar-free gum or candy. Foods that contain artificial sweeteners such as sorbitol, mannitol, isomalt, or xylitol. Foods that contain honey, high-fructose corn syrup, or agave. Bouillon, vegetable stock, beef stock, and chicken stock. Garlic and onion powder. Condiments made with onion, such as hummus, chutney, pickles, relish, salad dressing, and salsa. Tomato paste. Beverages Chicory-based drinks. Coffee substitutes. Chamomile tea. Fennel tea. Sweet or fortified wines such as port or sherry. Diet soft drinks made with isomalt, mannitol, maltitol, sorbitol, or xylitol. Apple, pear, and mango juice. Juices with high-fructose corn syrup. The items listed above may not be a complete list of foods and beverages you should avoid. Contact a dietitian for more information. Summary FODMAP stands for fermentable oligosaccharides, disaccharides, monosaccharides, and polyols. These are sugars that are hard for some people to digest. A low-FODMAP eating plan is a short-term diet that helps to ease symptoms of certain bowel diseases. The eating plan usually lasts up to 6 weeks. After that, high-FODMAP foods are reintroduced gradually and one at a time. This can help you find out which foods may be causing symptoms. A low-FODMAP eating plan can be complicated. It is best to work with a dietitian who has experience with this type of plan. This information is not intended to replace advice given to you by your health care provider. Make sure you discuss any questions  you have with your health care provider. Document Revised: 11/11/2019 Document Reviewed: 11/11/2019 Elsevier Patient Education  2024 [employer].

## 2022-12-05 NOTE — Assessment & Plan Note (Signed)
On statin and zetia, monitoring

## 2022-12-05 NOTE — Assessment & Plan Note (Signed)
On lisinopril for renal protection and DM and HTN well controlled

## 2022-12-05 NOTE — Assessment & Plan Note (Signed)
Last lipids done in Oct on crestor, cardiology recently added zetia Lab Results  Component Value Date   CHOL 121 04/15/2022   HDL 51 04/15/2022   LDLCALC 51 04/15/2022   LDLDIRECT 149.5 12/25/2006   TRIG 100 04/15/2022   CHOLHDL 2.4 04/15/2022

## 2022-12-05 NOTE — Assessment & Plan Note (Signed)
On lisinopril 10 mg daily, good compliance, no SE or concerns BP Readings from Last 3 Encounters:  11/11/22 127/74  08/15/22 128/78  07/11/22 132/80

## 2022-12-05 NOTE — Assessment & Plan Note (Signed)
Sx fairly well controlled with trazodone and with neuropathy being managed

## 2022-12-05 NOTE — Progress Notes (Signed)
Name: Jacqueline [name] - [facility]   MRN: [id number]    DOB: 1951-04-29   Date:12/05/2022       Progress Note  Chief Complaint  Patient presents with   Follow-up   Hypertension   Hyperlipidemia   Diabetes    Subjective:   Jacqueline [name] is a 72 y.o. female, presents to clinic for routine f/up on chronic conditions  Paroxymal Tachycardia on BB per cardiology and mild HTN on lisinopril 10 mg and metoprolol 25 BID BP Readings from Last 3 Encounters:  11/11/22 127/74  08/15/22 128/78  07/11/22 132/80   Pulse Readings from Last 3 Encounters:  12/05/22 71  11/11/22 78  07/11/22 75   Hyperlipidemia: Currently treated with crestor and zetia, pt reports good med compliance Last Lipids: Lab Results  Component Value Date   CHOL 121 04/15/2022   HDL 51 04/15/2022   LDLCALC 51 04/15/2022   LDLDIRECT 149.5 12/25/2006   TRIG 100 04/15/2022   CHOLHDL 2.4 04/15/2022   - Denies: Chest pain, shortness of breath, myalgias, claudication  T2DM which has been improved and controlled with diet changes only not currently on meds Denies: Polyuria, polydipsia, vision changes, neuropathy, hypoglycemia Recent pertinent labs: Lab Results  Component Value Date   HGBA1C 6.7 (H) 04/15/2022   HGBA1C 6.5 (H) 10/08/2021   HGBA1C 6.1 (H) 03/02/2021   Lab Results  Component Value Date   MICROALBUR 0.2 04/15/2022   LDLCALC 51 04/15/2022   CREATININE 0.99 04/15/2022   Standard of care and health maintenance: Urine Microalbumin:  UTD due in Oct Foot exam:  UTD due in OCt DM eye exam:  DUE ACEI/ARB:  yes Statin:  yes  B12 deficiency and peripheral neuropathy Will get B12 injection today  Neuropathy on gabapentin 100 mg BID-TID - well controlled  Obesity- weight stable - no increase over the past 6 months Wt Readings from Last 5 Encounters:  12/05/22 233 lb 4.8 oz (105.8 kg)  08/15/22 233 lb 11.2 oz (106 kg)  07/11/22 235 lb (106.6 kg)  04/15/22 229 lb 3.2 oz (104 kg)  01/09/22 227 lb  6.4 oz (103.1 kg)   BMI Readings from Last 5 Encounters:  12/05/22 40.05 kg/m  08/15/22 40.11 kg/m  07/11/22 40.34 kg/m  04/15/22 39.34 kg/m  01/09/22 39.03 kg/m   LE edema - has previously seen vascular, ankle pedal edema, previously could not tolerate any diuretic or HCTZ, she does consider management with compression socks however not recently with the hot weather, elevation and low-salt diet  Acute complaint today - loose stool frequent ongoing for a month +, urgent Bms no illness that started it,no focal pain or cramping - just months of new watery loose urgent BM     Current Outpatient Medications:    amoxicillin-clavulanate (AUGMENTIN) 875-125 MG tablet, Take by mouth., Disp: , Rfl:    aspirin 81 MG chewable tablet, Chew 81 mg by mouth daily., Disp: , Rfl:    blood glucose meter kit and supplies KIT, Lifescan meter Check blood sugar 2 times daily as needed, Disp: 1 each, Rfl: 0   calcium carbonate (OS-CAL) 600 MG TABS tablet, Take 600 mg by mouth 2 (two) times daily with a meal., Disp: 30 tablet, Rfl: 0   Cholecalciferol (VITAMIN D) 50 MCG (2000 UT) CAPS, Take 1 capsule by mouth daily., Disp: 30 capsule, Rfl:    Continuous Blood Gluc Sensor (FREESTYLE LIBRE 3 SENSOR) MISC, 1 Device by Does not apply route every 14 (fourteen) days., Disp: 1 each, Rfl: 0  ezetimibe (ZETIA) 10 MG tablet, Take 1 tablet by mouth once daily, Disp: 90 tablet, Rfl: 0   gabapentin (NEURONTIN) 300 MG capsule, Take 1 tablet in the AM and 2 tablets in the evening, Disp: 90 capsule, Rfl: 11   Lancets (ONETOUCH DELICA PLUS LANCET33G) MISC, USE AS DIRECTED TWICE A DAY AS NEEDED, Disp: 200 each, Rfl: 0   lisinopril (ZESTRIL) 10 MG tablet, TAKE ONE TABLET BY MOUTH ONCE DAILY, Disp: 30 tablet, Rfl: 0   Magnesium Oxide 500 MG CAPS, Take 1 capsule by mouth daily., Disp: , Rfl:    metoprolol tartrate (LOPRESSOR) 25 MG tablet, Take 1 tablet (25 mg total) by mouth 2 (two) times daily. PLEASE SCHEDULE OFFICE VISIT  FOR FURTHER REFILLS. THANK YOU!, Disp: 180 tablet, Rfl: 0   ONETOUCH ULTRA test strip, USE AS DIRECTED TWICE A DAY AS NEEDED, Disp: 200 strip, Rfl: 1   Potassium Gluconate 595 MG CAPS, Take 595 mg by mouth daily., Disp: , Rfl:    rosuvastatin (CRESTOR) 10 MG tablet, TAKE ONE TABLET BY MOUTH ONCE DAILY, Disp: 90 tablet, Rfl: 2   traZODone (DESYREL) 50 MG tablet, TAKE ONE TABLET BY MOUTH EVERYDAY AT BEDTIME, Disp: 30 tablet, Rfl: 0   VITAMIN E PO, Take 800 Units by mouth daily., Disp: , Rfl:    zinc gluconate 50 MG tablet, Take 50 mg by mouth daily., Disp: , Rfl:   Patient Active Problem List   Diagnosis Date Noted   Chronic diastolic heart failure (HCC) 10/12/2021   Paroxysmal tachycardia (HCC) 10/12/2021   Vitamin D deficiency 10/12/2021   DM (diabetes mellitus), type 2 (HCC) 10/12/2021   Peripheral edema 12/28/2019   Neuropathy 11/23/2019   B12 deficiency 09/23/2019   Stage 3a chronic kidney disease (HCC) 09/23/2019   History of kidney stones 10/14/2018   Insomnia 10/14/2018   Spinal stenosis of lumbar region 08/11/2009   Essential hypertension 01/14/2008   Class 2 severe obesity with serious comorbidity and body mass index (BMI) of 39.0 to 39.9 in adult (HCC) 12/25/2006   HLD (hyperlipidemia) 11/11/2006   Osteopenia 11/11/2006    Past Surgical History:  Procedure Laterality Date   BREAST BIOPSY Right 08/19/2012   neg, Dr. Lemar Livings   BREAST BIOPSY Left 04/30/2022   [place] bx, ribbon marker, path pending   extraction of renal stones     TONSILLECTOMY AND ADENOIDECTOMY     VAGINAL HYSTERECTOMY  1980   class IV, ovaries intact    Family History  Problem Relation Age of Onset   Hypertension Mother    Dementia Mother    Stroke Mother    Cancer - Other Father        Bile Duct   Heart disease Maternal Grandmother    Heart disease Maternal Grandfather    Hypertension Paternal Grandmother    Cancer Maternal Aunt        breast and lung   Stroke Paternal Grandfather     Social  History   Tobacco Use   Smoking status: Former    Packs/day: 0.25    Years: 30.00    Additional pack years: 0.00    Total pack years: 7.50    Types: Cigarettes    Start date: 07/09/1983    Quit date: 05/27/2014    Years since quitting: 8.5   Smokeless tobacco: Never  Vaping Use   Vaping Use: Never used  Substance Use Topics   Alcohol use: Yes    Alcohol/week: 4.0 standard drinks of alcohol    Types:  4 Glasses of wine per week    Comment: occasional   Drug use: No     No Known Allergies  Health Maintenance  Topic Date Due   Fecal DNA (Cologuard)  Never done   DTaP/Tdap/Td (4 - Tdap) 11/28/2019   HEMOGLOBIN A1C  10/15/2022   Colonoscopy  12/04/2022   OPHTHALMOLOGY EXAM  11/27/2022   COVID-19 Vaccine (3 - Pfizer risk series) 12/21/2022 (Originally 05/11/2020)   Zoster Vaccines- Shingrix (1 of 2) 03/07/2023 (Originally 07/28/1970)   DEXA SCAN  02/02/2023   INFLUENZA VACCINE  02/06/2023   MAMMOGRAM  03/29/2023   Diabetic kidney evaluation - eGFR measurement  04/16/2023   Diabetic kidney evaluation - Urine ACR  04/16/2023   FOOT EXAM  04/16/2023   Medicare Annual Wellness (AWV)  08/16/2023   Pneumonia Vaccine 36+ Years old  Completed   Hepatitis C Screening  Completed   HPV VACCINES  Aged Out    Chart Review Today: I have reviewed the patient's medical history in detail and updated the computerized patient record.   Review of Systems  Constitutional: Negative.   HENT: Negative.    Eyes: Negative.   Respiratory: Negative.    Cardiovascular: Negative.   Gastrointestinal: Negative.   Endocrine: Negative.   Genitourinary: Negative.   Musculoskeletal: Negative.   Skin: Negative.   Allergic/Immunologic: Negative.   Neurological: Negative.   Hematological: Negative.   Psychiatric/Behavioral: Negative.    All other systems reviewed and are negative.    Objective:   Vitals:   12/05/22 1048  Pulse: 71  Resp: 16  Temp: (!) 97.5 F (36.4 C)  TempSrc: Oral  SpO2:  95%  Weight: 233 lb 4.8 oz (105.8 kg)  Height: 5\' 4"  (1.626 m)    Body mass index is 40.05 kg/m.  Physical Exam Vitals and nursing note reviewed.  Constitutional:      General: She is not in acute distress.    Appearance: Normal appearance. She is well-developed. She is obese. She is not ill-appearing, toxic-appearing or diaphoretic.  HENT:     Head: Normocephalic and atraumatic.     Right Ear: External ear normal.     Left Ear: External ear normal.     Nose: Nose normal.  Eyes:     General: No scleral icterus.       Right eye: No discharge.        Left eye: No discharge.     Conjunctiva/sclera: Conjunctivae normal.  Neck:     Trachea: No tracheal deviation.  Cardiovascular:     Rate and Rhythm: Normal rate and regular rhythm.     Pulses: Normal pulses.     Heart sounds: Normal heart sounds. No murmur heard.    No friction rub. No gallop.     Comments: Bilateral pedal and ankle edema with some hyperpigmentation, non-pitting, no pretibial edema Pulmonary:     Effort: Pulmonary effort is normal. No respiratory distress.     Breath sounds: No stridor. Rhonchi present. No wheezing or rales.  Abdominal:     General: Bowel sounds are normal. There is no distension.     Palpations: Abdomen is soft. There is no hepatomegaly, splenomegaly, mass or pulsatile mass.     Tenderness: There is no abdominal tenderness. There is no right CVA tenderness, left CVA tenderness, guarding or rebound. Negative signs include Murphy's sign.  Musculoskeletal:     Right lower leg: Edema present.     Left lower leg: Edema present.  Skin:    General:  Skin is warm and dry.     Findings: No rash.  Neurological:     Mental Status: She is alert. Mental status is at baseline.     Motor: No abnormal muscle tone.     Coordination: Coordination normal.  Psychiatric:        Mood and Affect: Mood normal.        Behavior: Behavior normal.         Assessment & Plan:   Problem List Items Addressed  This Visit       Cardiovascular and Mediastinum   Essential hypertension - Primary    On lisinopril 10 mg daily, good compliance, no SE or concerns BP Readings from Last 3 Encounters:  11/11/22 127/74  08/15/22 128/78  07/11/22 132/80        Relevant Medications   lisinopril (ZESTRIL) 10 MG tablet   Other Relevant Orders   COMPLETE METABOLIC PANEL WITH GFR   Paroxysmal tachycardia (HCC)    Managed by cardiology on metoprolol 25 mg BID      Relevant Medications   lisinopril (ZESTRIL) 10 MG tablet   Aortic atherosclerosis (HCC)    On statin and zetia, monitoring      Relevant Medications   lisinopril (ZESTRIL) 10 MG tablet     Endocrine   DM (diabetes mellitus), type 2 (HCC)    T2DM which has been improved and controlled with diet changes only not currently on meds Due for recheck of labs, DM eye exam Recent pertinent labs: Lab Results  Component Value Date   HGBA1C 6.7 (H) 04/15/2022   HGBA1C 6.5 (H) 10/08/2021   HGBA1C 6.1 (H) 03/02/2021  Standard of care and health maintenance: Urine Microalbumin:  UTD due in Oct Foot exam:  UTD due in OCt DM eye exam:  DUE ACEI/ARB:  yes Statin:  yes      Relevant Medications   lisinopril (ZESTRIL) 10 MG tablet   Other Relevant Orders   Hemoglobin A1c   Ambulatory referral to Ophthalmology   COMPLETE METABOLIC PANEL WITH GFR     Nervous and Auditory   Neuropathy     Genitourinary   Stage 3a chronic kidney disease (HCC)    On lisinopril for renal protection and DM and HTN well controlled      Relevant Medications   lisinopril (ZESTRIL) 10 MG tablet   Other Relevant Orders   COMPLETE METABOLIC PANEL WITH GFR     Other   HLD (hyperlipidemia)    Last lipids done in Oct on crestor, cardiology recently added zetia Lab Results  Component Value Date   CHOL 121 04/15/2022   HDL 51 04/15/2022   LDLCALC 51 04/15/2022   LDLDIRECT 149.5 12/25/2006   TRIG 100 04/15/2022   CHOLHDL 2.4 04/15/2022        Relevant  Medications   lisinopril (ZESTRIL) 10 MG tablet   Other Relevant Orders   COMPLETE METABOLIC PANEL WITH GFR   Class 3 severe obesity with serious comorbidity and body mass index (BMI) of 40.0 to 44.9 in adult [facility])    Multiple associated comorbidities including CHF, hyperlipidemia, hypertension Weight has been stable over the last 6 months, she states she is exercising a lot and has been cutting back on foods and calorie intake but she has been unable to lose weight Wt Readings from Last 5 Encounters:  12/05/22 233 lb 4.8 oz (105.8 kg)  08/15/22 233 lb 11.2 oz (106 kg)  07/11/22 235 lb (106.6 kg)  04/15/22 229 lb 3.2  oz (104 kg)  01/09/22 227 lb 6.4 oz (103.1 kg)   BMI Readings from Last 5 Encounters:  12/05/22 40.05 kg/m  08/15/22 40.11 kg/m  07/11/22 40.34 kg/m  04/15/22 39.34 kg/m  01/09/22 39.03 kg/m        Insomnia    Sx fairly well controlled with trazodone and with neuropathy being managed      Relevant Medications   traZODone (DESYREL) 50 MG tablet   B12 deficiency    Doing monthly B12 injections which has helped with managing neuropathy sx and kept B12 at adequate level When off injections and doing oral b12 she returns to defieicnt levers and has more sx - so we will keep her on monthly b12      Peripheral edema    No pain, rashes or wounds, managing conservatively -avoiding immobility, elevating legs, low-salt diet She is not currently using compression stockings due to hot weather Some hyperpigmentation changes which we discussed today she can follow-up with vascular if she would like to be reassessed to see if there is any indication for medical intervention, otherwise continue monitoring and conservative mgmt      Other Visit Diagnoses     Screening for malignant neoplasm of colon       change to bowels - watery stools - will hold on cooguard for now would likely not result until bowels return to normal   Encounter for medication monitoring        Relevant Orders   Hemoglobin A1c   COMPLETE METABOLIC PANEL WITH GFR   Encounter for screening mammogram for malignant neoplasm of breast       Relevant Orders   MM 3D SCREENING MAMMOGRAM BILATERAL BREAST   Postmenopausal estrogen deficiency       Relevant Orders   DG Bone Density   Diarrhea, unspecified type       months of looser watery stools with urgency, no known triggering foods, no pain, exam today benign, can do RUQ [place] and basic labs - food diary, f/up GI   Relevant Orders   COMPLETE METABOLIC PANEL WITH GFR   CBC with Differential/Platelet   Lipase   Ambulatory referral to Gastroenterology   US Abdomen Limited RUQ (LIVER/GB)     Pt encouraged to keep food/BM/GI journal and try isolated diet elimination (ie dairy for 2 weeks, FODMAP, reviewed some basics about gluten... watch for food triggers)   Return in about 6 months (around 06/07/2023).   Danelle Berry, PA-C 12/05/22 10:51 AM

## 2022-12-05 NOTE — Assessment & Plan Note (Signed)
No pain, rashes or wounds, managing conservatively -avoiding immobility, elevating legs, low-salt diet She is not currently using compression stockings due to hot weather Some hyperpigmentation changes which we discussed today she can follow-up with vascular if she would like to be reassessed to see if there is any indication for medical intervention, otherwise continue monitoring and conservative mgmt

## 2022-12-06 LAB — COMPLETE METABOLIC PANEL WITH GFR
AG Ratio: 1.2 (calc) (ref 1.0–2.5)
ALT: 22 U/L (ref 6–29)
AST: 17 U/L (ref 10–35)
Albumin: 3.8 g/dL (ref 3.6–5.1)
Alkaline phosphatase (APISO): 59 U/L (ref 37–153)
BUN: 17 mg/dL (ref 7–25)
CO2: 27 mmol/L (ref 20–32)
Calcium: 9.8 mg/dL (ref 8.6–10.4)
Chloride: 103 mmol/L (ref 98–110)
Creat: 0.88 mg/dL (ref 0.60–1.00)
Globulin: 3.3 g/dL (calc) (ref 1.9–3.7)
Glucose, Bld: 109 mg/dL — ABNORMAL HIGH (ref 65–99)
Potassium: 4.8 mmol/L (ref 3.5–5.3)
Sodium: 141 mmol/L (ref 135–146)
Total Bilirubin: 0.4 mg/dL (ref 0.2–1.2)
Total Protein: 7.1 g/dL (ref 6.1–8.1)
eGFR: 70 mL/min/{1.73_m2} (ref >=60)

## 2022-12-06 LAB — HEMOGLOBIN A1C
Hgb A1c MFr Bld: 6.8 % of total Hgb — ABNORMAL HIGH (ref <5.7)
Mean Plasma Glucose: 148 mg/dL
eAG (mmol/L): 8.2 mmol/L

## 2022-12-06 LAB — CBC WITH DIFFERENTIAL/PLATELET
Basophils Absolute: 116 cells/uL (ref 0–200)
Eosinophils Absolute: 267 cells/uL (ref 15–500)
Eosinophils Relative: 2.3 %
Hemoglobin: 14.5 g/dL (ref 11.7–15.5)
Lymphs Abs: 2714 cells/uL (ref 850–3900)
MCH: 30.6 pg (ref 27.0–33.0)
MPV: 9.8 fL (ref 7.5–12.5)
Neutro Abs: 7447 cells/uL (ref 1500–7800)
Neutrophils Relative %: 64.2 %
Platelets: 279 10*3/uL (ref 140–400)
RBC: 4.74 10*6/uL (ref 3.80–5.10)
RDW: 13.1 % (ref 11.0–15.0)
WBC: 11.6 10*3/uL — ABNORMAL HIGH (ref 3.8–10.8)

## 2022-12-06 LAB — LIPASE: Lipase: 48 U/L (ref 7–60)

## 2022-12-08 ENCOUNTER — Ambulatory Visit
Admission: RE | Admit: 2022-12-08 | Discharge: 2022-12-08 | Disposition: A | Discharge status: Home / Self Care | Payer: Medicare HMO | Source: Ambulatory Visit | Attending: Family Medicine | Admitting: Family Medicine

## 2022-12-08 VITALS — BP 112/72 | HR 106 | Temp 99.6°F | Resp 18

## 2022-12-08 DIAGNOSIS — R509 Fever, unspecified: Secondary | ICD-10-CM | POA: Diagnosis not present

## 2022-12-08 DIAGNOSIS — B349 Viral infection, unspecified: Secondary | ICD-10-CM | POA: Diagnosis not present

## 2022-12-08 NOTE — ED Provider Notes (Signed)
Renaldo Fiddler    CSN: [id number] Arrival date & time: 12/08/22  1020      History   Chief Complaint Chief Complaint  Patient presents with   Fever    Entered by patient    HPI Jacqueline [name] is a 72 y.o. female.  Patient presents with fever, body aches, fatigue x 2 days.  Tmax 101 on 12/06/2022.  Treating with ibuprofen.  Negative COVID test at home.  She has a cough but it is at baseline.  She is currently on an antibiotic and just completed prednisone.  She denies rash, chest pain, shortness of breath, or other symptoms.   Patient was seen by her PCP on 12/05/2022 for routine follow-up of chronic conditions.  [facility] [place] on 11/30/2022; diagnosed with sinusitis; treated with Augmentin and prednisone.  Her medical history includes hypertension, obesity, CKD, heart failure, diabetes.    The history is provided by the patient and medical records.    Past Medical History:  Diagnosis Date   Calcium oxalate renal stones    Diabetes mellitus without complication (HCC)    Hyperlipidemia    Hypertension    MVP (mitral valve prolapse)    Neuropathy 11/23/2019    Patient Active Problem List   Diagnosis Date Noted   Aortic atherosclerosis (HCC) 12/05/2022   Chronic diastolic heart failure (HCC) 10/12/2021   Paroxysmal tachycardia (HCC) 10/12/2021   Vitamin D deficiency 10/12/2021   DM (diabetes mellitus), type 2 (HCC) 10/12/2021   Peripheral edema 12/28/2019   Neuropathy 11/23/2019   B12 deficiency 09/23/2019   Stage 3a chronic kidney disease (HCC) 09/23/2019   History of kidney stones 10/14/2018   Insomnia 10/14/2018   Spinal stenosis of lumbar region 08/11/2009   Essential hypertension 01/14/2008   Class 3 severe obesity with serious comorbidity and body mass index (BMI) of 40.0 to 44.9 in adult (HCC) 12/25/2006   HLD (hyperlipidemia) 11/11/2006   Osteopenia 11/11/2006    Past Surgical History:  Procedure Laterality Date   BREAST  BIOPSY Right 08/19/2012   neg, Dr. Lemar Livings   BREAST BIOPSY Left 04/30/2022   [place] bx, ribbon marker, path pending   extraction of renal stones     TONSILLECTOMY AND ADENOIDECTOMY     VAGINAL HYSTERECTOMY  1980   class IV, ovaries intact    OB History     Gravida  2   Para  2   Term      Preterm      AB      Living  2      SAB      IAB      Ectopic      Multiple      Live Births           Obstetric Comments  1st Menstrual Cycle:  15 1st Pregnancy:  18          Home Medications    Prior to Admission medications   Medication Sig Start Date End Date Taking? Authorizing Provider  amoxicillin-clavulanate (AUGMENTIN) 875-125 MG tablet Take by mouth. 11/30/22 12/10/22  [provider]  aspirin 81 MG chewable tablet Chew 81 mg by mouth daily.    [provider]  blood glucose meter kit and supplies KIT Lifescan meter Check blood sugar 2 times daily as needed 04/05/19   Lorre Munroe, NP  calcium carbonate (OS-CAL) 600 MG TABS tablet Take 600 mg by mouth 2 (two) times daily with  a meal. 10/13/18   Lorre Munroe, NP  Cholecalciferol (VITAMIN D) 50 MCG (2000 UT) CAPS Take 1 capsule by mouth daily. 10/13/18   Lorre Munroe, NP  Continuous Blood Gluc Sensor (FREESTYLE LIBRE 3 SENSOR) MISC 1 Device by Does not apply route every 14 (fourteen) days. 03/14/22   Danelle Berry, PA-C  ezetimibe (ZETIA) 10 MG tablet Take 1 tablet by mouth once daily 11/18/22   Antonieta Iba, MD  gabapentin (NEURONTIN) 300 MG capsule Take 1 tablet in the AM and 2 tablets in the evening 04/15/22   Danelle Berry, PA-C  Lancets (ONETOUCH DELICA PLUS [employer]) MISC USE AS DIRECTED TWICE A DAY AS NEEDED 08/23/19   Lorre Munroe, NP  lisinopril (ZESTRIL) 10 MG tablet Take 1 tablet (10 mg total) by mouth daily. 12/05/22   Danelle Berry, PA-C  Magnesium Oxide 500 MG CAPS Take 1 capsule by mouth daily.    [provider]  metoprolol tartrate (LOPRESSOR) 25 MG tablet Take 1 tablet  (25 mg total) by mouth 2 (two) times daily. PLEASE SCHEDULE OFFICE VISIT FOR FURTHER REFILLS. THANK YOU! 10/31/22   Antonieta Iba, MD  [facility] ULTRA test strip USE AS DIRECTED TWICE A DAY AS NEEDED 09/14/21   Mecum, Oswaldo Conroy, PA-C  Potassium Gluconate 595 MG CAPS Take 595 mg by mouth daily.    [provider]  rosuvastatin (CRESTOR) 10 MG tablet TAKE ONE TABLET BY MOUTH ONCE DAILY 07/16/22   Mecum, Erin E, PA-C  traZODone (DESYREL) 50 MG tablet TAKE ONE TABLET BY MOUTH EVERYDAY AT BEDTIME 12/05/22   Danelle Berry, PA-C  VITAMIN E PO Take 800 Units by mouth daily.    [provider]  zinc gluconate 50 MG tablet Take 50 mg by mouth daily.    [provider]    Family History Family History  Problem Relation Age of Onset   Hypertension Mother    Dementia Mother    Stroke Mother    Cancer - Other Father        Bile Duct   Heart disease Maternal Grandmother    Heart disease Maternal Grandfather    Hypertension Paternal Grandmother    Cancer Maternal Aunt        breast and lung   Stroke Paternal Grandfather     Social History Social History   Tobacco Use   Smoking status: Former    Packs/day: 0.25    Years: 30.00    Additional pack years: 0.00    Total pack years: 7.50    Types: Cigarettes    Start date: 07/09/1983    Quit date: 05/27/2014    Years since quitting: 8.5   Smokeless tobacco: Never  Vaping Use   Vaping Use: Never used  Substance Use Topics   Alcohol use: Yes    Alcohol/week: 4.0 standard drinks of alcohol    Types: 4 Glasses of wine per week    Comment: occasional   Drug use: No     Allergies   Patient has no known allergies.   Review of Systems Review of Systems  Constitutional:  Positive for fatigue and fever. Negative for chills.  HENT:  Negative for ear pain and sore throat.   Respiratory:  Positive for cough. Negative for shortness of breath.   Cardiovascular:  Negative for chest pain, palpitations and leg swelling.   Gastrointestinal:  Negative for diarrhea and vomiting.  Skin:  Negative for color change and rash.  All other systems reviewed and  are negative.    Physical Exam Triage Vital Signs ED Triage Vitals  Enc Vitals Group     BP 12/08/22 1039 112/72     Pulse Rate 12/08/22 1032 (!) 106     Resp 12/08/22 1032 18     Temp 12/08/22 1032 99.6 F (37.6 C)     Temp src --      SpO2 12/08/22 1032 93 %     Weight --      Height --      Head Circumference --      Peak Flow --      Pain Score 12/08/22 1035 4     Pain Loc --      Pain Edu? --      Excl. in GC? --    No data found.  Updated Vital Signs BP 112/72   Pulse (!) 106   Temp 99.6 F (37.6 C)   Resp 18   SpO2 93%   Visual Acuity Right Eye Distance:   Left Eye Distance:   Bilateral Distance:    Right Eye Near:   Left Eye Near:    Bilateral Near:     Physical Exam Vitals and nursing note reviewed.  Constitutional:      General: She is not in acute distress.    Appearance: She is well-developed. She is obese. She is not ill-appearing.  HENT:     Right Ear: Tympanic membrane normal.     Left Ear: Tympanic membrane normal.     Nose: Nose normal.     Mouth/Throat:     Mouth: Mucous membranes are moist.     Pharynx: Oropharynx is clear.     Comments: Clear PND. Cardiovascular:     Rate and Rhythm: Normal rate and regular rhythm.     Heart sounds: Normal heart sounds.  Pulmonary:     Effort: Pulmonary effort is normal. No respiratory distress.     Breath sounds: Normal breath sounds. No wheezing, rhonchi or rales.  Musculoskeletal:     Cervical back: Neck supple.  Skin:    General: Skin is warm and dry.  Neurological:     Mental Status: She is alert.  Psychiatric:        Mood and Affect: Mood normal.        Behavior: Behavior normal.      UC Treatments / Results  Labs (all labs ordered are listed, but only abnormal results are displayed) Labs Reviewed - No data to display  EKG   Radiology No  results found.  Procedures Procedures (including critical care time)  Medications Ordered in UC Medications - No data to display  Initial Impression / Assessment and Plan / UC Course  I have reviewed the triage vital signs and the nursing notes.  Pertinent labs & imaging results that were available during my care of the patient were reviewed by me and considered in my medical decision making (see chart for details).   Fever, Viral illness.  Lungs are clear at this time.  Patient is currently on Augmentin and just completed prednisone.  She was seen by her PCP 3 days ago.  Instructed patient to continue symptomatic treatment including Tylenol as needed.  Education provided on adult fever and viral illness.  Instructed her to follow-up with her PCP tomorrow.   Final Clinical Impressions(s) / UC Diagnoses   Final diagnoses:  Fever, unspecified  Viral illness     Discharge Instructions      Take Tylenol as  needed for fever or discomfort.  Follow up with your primary care provider.        ED Prescriptions   None    PDMP not reviewed this encounter.   Mickie Bail, NP 12/08/22 [phone]

## 2022-12-08 NOTE — Discharge Instructions (Addendum)
Take Tylenol as needed for fever or discomfort.  Follow up with your primary care provider.

## 2022-12-08 NOTE — ED Triage Notes (Signed)
Patient to Urgent Care with complaints of fevers that started on Friday night. Temp last night 101. Generalized body aches and feeling poorly. Treating with ibuprofen.   Negative home Covid test yesterday.   States headaches last weekend- was diagnosed with a sinus infection and prescribed amoxicillin and prednisone taper. Symptoms resolved until Friday. Three days left of abx.

## 2022-12-09 ENCOUNTER — Other Ambulatory Visit: Payer: Self-pay | Admitting: Family Medicine

## 2022-12-09 DIAGNOSIS — E1142 Type 2 diabetes mellitus with diabetic polyneuropathy: Secondary | ICD-10-CM

## 2022-12-09 DIAGNOSIS — G4700 Insomnia, unspecified: Secondary | ICD-10-CM

## 2022-12-09 DIAGNOSIS — I10 Essential (primary) hypertension: Secondary | ICD-10-CM

## 2022-12-09 DIAGNOSIS — N1831 Chronic kidney disease, stage 3a: Secondary | ICD-10-CM

## 2022-12-10 ENCOUNTER — Ambulatory Visit
Admission: RE | Admit: 2022-12-10 | Discharge: 2022-12-10 | Disposition: A | Discharge status: Home / Self Care | Payer: Medicare HMO | Source: Ambulatory Visit | Attending: Family Medicine | Admitting: Family Medicine

## 2022-12-10 DIAGNOSIS — R197 Diarrhea, unspecified: Secondary | ICD-10-CM | POA: Insufficient documentation

## 2022-12-10 DIAGNOSIS — R194 Change in bowel habit: Secondary | ICD-10-CM | POA: Diagnosis not present

## 2022-12-12 ENCOUNTER — Telehealth: Payer: Self-pay | Admitting: Family Medicine

## 2022-12-12 NOTE — Telephone Encounter (Signed)
error 

## 2022-12-13 DIAGNOSIS — R252 Cramp and spasm: Secondary | ICD-10-CM | POA: Diagnosis not present

## 2022-12-13 DIAGNOSIS — R202 Paresthesia of skin: Secondary | ICD-10-CM | POA: Diagnosis not present

## 2022-12-13 DIAGNOSIS — G629 Polyneuropathy, unspecified: Secondary | ICD-10-CM | POA: Diagnosis not present

## 2022-12-13 DIAGNOSIS — R200 Anesthesia of skin: Secondary | ICD-10-CM | POA: Diagnosis not present

## 2022-12-17 DIAGNOSIS — E119 Type 2 diabetes mellitus without complications: Secondary | ICD-10-CM | POA: Diagnosis not present

## 2022-12-17 DIAGNOSIS — H269 Unspecified cataract: Secondary | ICD-10-CM | POA: Diagnosis not present

## 2022-12-17 DIAGNOSIS — H5203 Hypermetropia, bilateral: Secondary | ICD-10-CM | POA: Diagnosis not present

## 2022-12-17 DIAGNOSIS — H524 Presbyopia: Secondary | ICD-10-CM | POA: Diagnosis not present

## 2022-12-17 LAB — HM DIABETES EYE EXAM

## 2023-01-06 ENCOUNTER — Ambulatory Visit (INDEPENDENT_AMBULATORY_CARE_PROVIDER_SITE_OTHER): Payer: Medicare HMO

## 2023-01-06 DIAGNOSIS — E538 Deficiency of other specified B group vitamins: Secondary | ICD-10-CM

## 2023-01-06 MED ORDER — CYANOCOBALAMIN 1000 MCG/ML IJ SOLN
1000.0000 ug | Freq: Once | INTRAMUSCULAR | Status: AC
Start: 2023-01-06 — End: 2023-01-06
  Administered 2023-01-06: 1000 ug via INTRAMUSCULAR

## 2023-01-06 NOTE — Progress Notes (Signed)
Patient presented for B-12 injection in expressed good health. Patient tolerated injection well with no adverse side effects noted at time of clinic departure.

## 2023-02-03 ENCOUNTER — Ambulatory Visit (INDEPENDENT_AMBULATORY_CARE_PROVIDER_SITE_OTHER): Payer: Medicare HMO

## 2023-02-03 DIAGNOSIS — E538 Deficiency of other specified B group vitamins: Secondary | ICD-10-CM | POA: Diagnosis not present

## 2023-02-03 MED ORDER — CYANOCOBALAMIN 1000 MCG/ML IJ SOLN
1000.0000 ug | Freq: Once | INTRAMUSCULAR | Status: AC
Start: 2023-02-03 — End: 2023-02-03
  Administered 2023-02-03: 1000 ug via INTRAMUSCULAR

## 2023-02-10 ENCOUNTER — Other Ambulatory Visit: Payer: Self-pay | Admitting: Cardiovascular Disease

## 2023-02-11 ENCOUNTER — Ambulatory Visit: Payer: Medicare HMO | Admitting: Physician Assistant

## 2023-02-11 ENCOUNTER — Encounter: Payer: Self-pay | Admitting: Physician Assistant

## 2023-02-11 ENCOUNTER — Other Ambulatory Visit: Payer: Self-pay | Admitting: Cardiovascular Disease

## 2023-02-11 VITALS — BP 123/73 | HR 80 | Temp 98.3°F | Ht 64.0 in | Wt 231.6 lb

## 2023-02-11 DIAGNOSIS — I700 Atherosclerosis of aorta: Secondary | ICD-10-CM

## 2023-02-11 DIAGNOSIS — Z1211 Encounter for screening for malignant neoplasm of colon: Secondary | ICD-10-CM

## 2023-02-11 DIAGNOSIS — R197 Diarrhea, unspecified: Secondary | ICD-10-CM

## 2023-02-11 DIAGNOSIS — E785 Hyperlipidemia, unspecified: Secondary | ICD-10-CM

## 2023-02-11 NOTE — Progress Notes (Signed)
Celso Amy, PA-C [address]  [address]  [place], [place] [address]  Main: [phone]  Fax: [phone]   Gastroenterology Consultation  Referring Provider:     Danelle Berry, PA-C Primary Care Physician:  Danelle Berry, PA-C Primary Gastroenterologist:  Celso Amy, PA-C / Dr. Wyline Mood   Reason for Consultation:     Diarrhea        HPI:   Jacqueline [name] is a 72 y.o. y/o female referred for consultation & management  by Danelle Berry, PA-C.    Patient states she began having acute diarrhea in May 2024.  She is having 7-8 loose and watery stools per day.  She denies rectal bleeding, abdominal pain, or weight loss.  She took amoxicillin for sinus infection Memorial Day weekend, end of May, however diarrhea started before that.  She is taking Imodium with little benefit.  He has had 3 explosive bowel movements this morning since 5 AM.  She has not had any stool studies.  No recent travel.  No new medications.  She denies nausea, vomiting, heartburn, or dysphagia.  No family history of colon cancer or IBD.  Labs 12/05/2022 showed normal BMP, hepatic panel and lipase.  CBC showed slightly elevated white count 11.6, normal hemoglobin 14.5.  Last screening colonoscopy done 08/2012 by Dr. Lina Sar at [facility] showed mild diverticulosis, good prep, no polyps.  10-year repeat was recommended, which is due.  Past Medical History:  Diagnosis Date   Calcium oxalate renal stones    Diabetes mellitus without complication (HCC)    Hyperlipidemia    Hypertension    MVP (mitral valve prolapse)    Neuropathy 11/23/2019    Past Surgical History:  Procedure Laterality Date   BREAST BIOPSY Right 08/19/2012   neg, Dr. Lemar Livings   BREAST BIOPSY Left 04/30/2022   [place] bx, ribbon marker, path pending   extraction of renal stones     TONSILLECTOMY AND ADENOIDECTOMY     VAGINAL HYSTERECTOMY  1980   class IV, ovaries intact    Prior to Admission medications   Medication Sig Start  Date End Date Taking? Authorizing Provider  aspirin 81 MG chewable tablet Chew 81 mg by mouth daily.   Yes [provider]  blood glucose meter kit and supplies KIT Lifescan meter Check blood sugar 2 times daily as needed 04/05/19  Yes Baity, Salvadore Oxford, NP  calcium carbonate (OS-CAL) 600 MG TABS tablet Take 600 mg by mouth 2 (two) times daily with a meal. 10/13/18  Yes Baity, Salvadore Oxford, NP  Cholecalciferol (VITAMIN D) 50 MCG (2000 UT) CAPS Take 1 capsule by mouth daily. 10/13/18  Yes Baity, Salvadore Oxford, NP  Continuous Blood Gluc Sensor (FREESTYLE LIBRE 3 SENSOR) MISC 1 Device by Does not apply route every 14 (fourteen) days. 03/14/22  Yes Danelle Berry, PA-C  ezetimibe (ZETIA) 10 MG tablet Take 1 tablet by mouth once daily 11/18/22  Yes Gollan, Tollie Pizza, MD  gabapentin (NEURONTIN) 300 MG capsule Take 1 tablet in the AM and 2 tablets in the evening 04/15/22  Yes Tapia, [place], PA-C  Lancets (ONETOUCH DELICA PLUS [place]) MISC USE AS DIRECTED TWICE A DAY AS NEEDED 08/23/19  Yes Baity, Salvadore Oxford, NP  lisinopril (ZESTRIL) 10 MG tablet Take 1 tablet (10 mg total) by mouth daily. 12/05/22  Yes Danelle Berry, PA-C  Magnesium Oxide 500 MG CAPS Take 1 capsule by mouth daily.   Yes [provider]  metoprolol tartrate (LOPRESSOR) 25 MG tablet  TAKE ONE TABLET BY MOUTH TWICE DAILY Please schedule office visit FOR further refills 02/10/23  Yes Gollan, Tollie Pizza, MD  [facility] ULTRA test strip USE AS DIRECTED TWICE A DAY AS NEEDED 09/14/21  Yes Mecum, Erin E, PA-C  Potassium Gluconate 595 MG CAPS Take 595 mg by mouth daily.   Yes [provider]  rosuvastatin (CRESTOR) 10 MG tablet TAKE ONE TABLET BY MOUTH ONCE DAILY 07/16/22  Yes Mecum, Erin E, PA-C  traZODone (DESYREL) 50 MG tablet TAKE ONE TABLET BY MOUTH EVERYDAY AT BEDTIME 12/05/22  Yes Danelle Berry, PA-C  VITAMIN E PO Take 800 Units by mouth daily.   Yes [provider]  zinc gluconate 50 MG tablet Take 50 mg by mouth daily.   Yes [provider]    Family History  Problem Relation Age of Onset   Hypertension Mother    Dementia Mother    Stroke Mother    Cancer - Other Father        Bile Duct   Heart disease Maternal Grandmother    Heart disease Maternal Grandfather    Hypertension Paternal Grandmother    Cancer Maternal Aunt        breast and lung   Stroke Paternal Grandfather      Social History   Tobacco Use   Smoking status: Former    Current packs/day: 0.00    Average packs/day: 0.3 packs/day for 30.9 years (7.7 ttl pk-yrs)    Types: Cigarettes    Start date: 07/09/1983    Quit date: 05/27/2014    Years since quitting: 8.7   Smokeless tobacco: Never  Vaping Use   Vaping status: Never Used  Substance Use Topics   Alcohol use: Yes    Alcohol/week: 4.0 standard drinks of alcohol    Types: 4 Glasses of wine per week    Comment: occasional   Drug use: No    Allergies as of 02/11/2023   (No Known Allergies)    Review of Systems:    All systems reviewed and negative except where noted in HPI.   Physical Exam:  BP 123/73   Pulse 80   Temp 98.3 F (36.8 C)   Ht 5\' 4"  (1.626 m)   Wt 231 lb 9.6 oz (105.1 kg)   BMI 39.75 kg/m  No LMP recorded. Patient has had a hysterectomy. Psych:  Alert and cooperative. Normal mood and affect. General:   Alert,  Well-developed, well-nourished, pleasant and cooperative in NAD Head:  Normocephalic and atraumatic. Eyes:  Sclera clear, no icterus.   Conjunctiva pink. Neck:  Supple; no masses or thyromegaly. Lungs:  Respirations even and unlabored.  Clear throughout to auscultation.   No wheezes, crackles, or rhonchi. No acute distress. Heart:  Regular rate and rhythm; no murmurs, clicks, rubs, or gallops. Abdomen:  Normal bowel sounds.  No bruits.  Soft, and non-distended without masses, hepatosplenomegaly or hernias noted.  No Tenderness.  No guarding or rebound tenderness.    Neurologic:  Alert and oriented x3;  grossly normal neurologically. Psych:   Alert and cooperative. Normal mood and affect.  Imaging Studies: No results found.  Assessment and Plan:   Jacqueline [name] is a 72 y.o. y/o female has been referred for acute diarrhea since 11/2022.  Diarrhea  Stool Studies: GI Pathogen Panal, C. Difficile Toxin PCR, Fecal Calprotectin   Stool studies are negative for infection, then we will schedule a colonoscopy to evaluate for microscopic colitis.  Colon cancer screening  Last Screening  colonoscopy done 08/2012 was negative.  10-year repeat screening colonoscopy is due.  If stool studies are negative for infections, then we will schedule a colonoscopy.  Follow up will be based on results of above testing.  Also based on GI symptoms.  Celso Amy, PA-C

## 2023-02-18 DIAGNOSIS — R197 Diarrhea, unspecified: Secondary | ICD-10-CM | POA: Diagnosis not present

## 2023-02-20 ENCOUNTER — Ambulatory Visit: Payer: Medicare HMO | Admitting: Physician Assistant

## 2023-02-22 ENCOUNTER — Encounter: Payer: Self-pay | Admitting: Physician Assistant

## 2023-02-24 ENCOUNTER — Telehealth: Payer: Self-pay

## 2023-02-24 NOTE — Telephone Encounter (Signed)
Spoke with patient to let her lknow the test are not back but as soon as they do I will call .

## 2023-03-03 ENCOUNTER — Ambulatory Visit (INDEPENDENT_AMBULATORY_CARE_PROVIDER_SITE_OTHER): Payer: Medicare HMO

## 2023-03-03 ENCOUNTER — Encounter: Payer: Self-pay | Admitting: Physician Assistant

## 2023-03-03 DIAGNOSIS — E538 Deficiency of other specified B group vitamins: Secondary | ICD-10-CM | POA: Diagnosis not present

## 2023-03-03 MED ORDER — CYANOCOBALAMIN 1000 MCG/ML IJ SOLN
1000.0000 ug | Freq: Once | INTRAMUSCULAR | Status: AC
Start: 2023-03-03 — End: 2023-03-03
  Administered 2023-03-03: 1000 ug via INTRAMUSCULAR

## 2023-03-03 NOTE — Progress Notes (Signed)
Patient presented in expressed good health for B-12 injection/ Tolerated well, no adverse reaction noticed at time of clinic departure.

## 2023-03-17 DIAGNOSIS — R197 Diarrhea, unspecified: Secondary | ICD-10-CM | POA: Diagnosis not present

## 2023-03-20 ENCOUNTER — Telehealth: Payer: Self-pay

## 2023-03-20 ENCOUNTER — Other Ambulatory Visit: Payer: Self-pay

## 2023-03-20 DIAGNOSIS — R197 Diarrhea, unspecified: Secondary | ICD-10-CM

## 2023-03-20 DIAGNOSIS — Z1211 Encounter for screening for malignant neoplasm of colon: Secondary | ICD-10-CM

## 2023-03-20 MED ORDER — PEG 3350-KCL-NA BICARB-NACL 420 G PO SOLR: 4000.0000 mL | Freq: Once | ORAL | 0 refills | Status: AC

## 2023-03-20 NOTE — Telephone Encounter (Signed)
Spoke with patient- scheduled colonoscopy 04-16-23 Dr.wohl -golytely prep sent to pharmacy-stop imodium 5 days prior.   Fecal Calprotectin stool test is normal.  No evidence of inflammatory bowel disease such as ulcerative colitis or Crohn's.  Velna Hatchet, please call patient and schedule a colonoscopy.  Dx: Colon Cancer Screening and Diarrhea.  OK to give SuPrep or GoLytely.  Schedule with Any MD.  Tell patient to STOP Imodium 5 days before Colonoscopy to ensure good prep.  Celso Amy, PA-C

## 2023-03-24 NOTE — Progress Notes (Unsigned)
Cardiology Office Note  Date:  03/25/2023   ID:  Coda [facility], DOB [birth year], MRN [id number]  PCP:  Danelle Berry, PA-C   Chief Complaint  Patient presents with   6 month follow up     Patient c/o swelling in ankles & feet. Medications reviewed by the patient verbally.     HPI:  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, does appraisal, sitting for long periods of time No regular exercise program Denies chest pain or shortness of breath on exertion Denies significant tachycardia or palpitations on metoprolol  Reports diet is not that good  Lab work reviewed A1c 6.8 trending upwards, up from 6.2 Normal BMP  EKG personally reviewed by myself on todays visit EKG Interpretation Date/Time:  Tuesday March 25 2023 09:04:05 EDT Ventricular Rate:  81 PR Interval:  148 QRS Duration:  78 QT Interval:  368 QTC Calculation: 427 R Axis:   2  Text Interpretation: Normal sinus rhythm Minimal voltage criteria for LVH, may be normal variant ( R in aVL ) When compared with ECG of 14-Jun-2019 20:36, No significant change was found Confirmed by Julien Nordmann [phone]) on 03/25/2023 9:14:23 AM    Event monitor Sinus rhythm  5  Supraventricular Tachycardia runs  the longest lasting 6 beats with an avg rate of 125 bpm.  Patient triggered events associated with symptomatic PVCs  Previous trip to the emergency room for tachycardia prior to starting metoprolol  PMH:   has a past medical history of Calcium oxalate renal stones, Diabetes mellitus without complication (HCC), Hyperlipidemia, Hypertension, MVP (mitral valve  prolapse), and Neuropathy (11/23/2019).  PSH:    Past Surgical History:  Procedure Laterality Date   BREAST BIOPSY Right 08/19/2012   neg, Dr. Lemar Livings   BREAST BIOPSY Left 04/30/2022   [place] bx, ribbon marker, path pending   extraction of renal stones     TONSILLECTOMY AND ADENOIDECTOMY     VAGINAL HYSTERECTOMY  1980   class IV, ovaries intact    Current Outpatient Medications  Medication Sig Dispense Refill   aspirin 81 MG chewable tablet Chew 81 mg by mouth daily.     calcium carbonate (OS-CAL) 600 MG TABS tablet Take 600 mg by mouth 2 (two) times daily with a meal. 30 tablet 0   Cholecalciferol (VITAMIN D) 50 MCG (2000 UT) CAPS Take 1 capsule by mouth daily. 30 capsule    Continuous Blood Gluc Sensor (FREESTYLE LIBRE 3 SENSOR) MISC 1 Device by Does not apply route every 14 (fourteen) days. 1 each 0   ezetimibe (ZETIA) 10 MG tablet Take 1 tablet by mouth once daily 90 tablet 0   gabapentin (NEURONTIN) 300 MG capsule Take 1 tablet in the AM and 2 tablets in the evening 90 capsule 11   lisinopril (ZESTRIL) 10 MG tablet Take 1 tablet (10 mg total) by mouth daily. 30 tablet 5   Magnesium Oxide 500 MG CAPS Take 1 capsule by mouth daily.     metoprolol tartrate (LOPRESSOR) 25 MG tablet TAKE ONE TABLET BY MOUTH TWICE DAILY Please schedule office visit FOR further refills 180 tablet 0   Potassium Gluconate 595 MG CAPS Take  595 mg by mouth daily.     rosuvastatin (CRESTOR) 10 MG tablet TAKE ONE TABLET BY MOUTH ONCE DAILY 90 tablet 2   traZODone (DESYREL) 50 MG tablet TAKE ONE TABLET BY MOUTH EVERYDAY AT BEDTIME 30 tablet 11   VITAMIN E PO Take 800 Units by mouth daily.     zinc gluconate 50 MG tablet Take 50 mg by mouth daily.     blood glucose meter kit and supplies KIT Lifescan meter Check blood sugar 2 times daily as needed (Patient not taking: Reported on 03/25/2023) 1 each 0   Lancets (ONETOUCH DELICA PLUS LANCET33G) MISC USE AS DIRECTED TWICE A DAY AS NEEDED (Patient not taking: Reported  on 03/25/2023) 200 each 0   ONETOUCH ULTRA test strip USE AS DIRECTED TWICE A DAY AS NEEDED (Patient not taking: Reported on 03/25/2023) 200 strip 1   No current facility-administered medications for this visit.     Allergies:   Patient has no known allergies.   Social History:  The patient  reports that she quit smoking about 8 years ago. Her smoking use included cigarettes. She started smoking about 39 years ago. She has a 7.7 pack-year smoking history. She has never used smokeless tobacco. She reports current alcohol use of about 4.0 standard drinks of alcohol per week. She reports that she does not use drugs.   Family History:   family history includes Cancer in her maternal aunt; Cancer - Other in her father; Dementia in her mother; Heart disease in her maternal grandfather and maternal grandmother; Hypertension in her mother and paternal grandmother; Stroke in her mother and paternal grandfather.    Review of Systems: Review of Systems  Constitutional: Negative.   HENT: Negative.    Respiratory: Negative.    Cardiovascular: Negative.   Gastrointestinal: Negative.   Musculoskeletal: Negative.   Neurological: Negative.   Psychiatric/Behavioral: Negative.    All other systems reviewed and are negative.   PHYSICAL EXAM: VS:  BP 130/76 (BP Location: Left Arm, Patient Position: Sitting, Cuff Size: Normal)   Pulse 81   Ht 5\' 4"  (1.626 m)   Wt 233 lb 2 oz (105.7 kg)   SpO2 96%   BMI 40.02 kg/m  , BMI Body mass index is 40.02 kg/m. Constitutional:  oriented to person, place, and time. No distress.  HENT:  Head: Grossly normal Eyes:  no discharge. No scleral icterus.  Neck: No JVD, no carotid bruits  Cardiovascular: Regular rate and rhythm, no murmurs appreciated Pulmonary/Chest: Clear to auscultation bilaterally, no wheezes or rails Abdominal: Soft.  no distension.  no tenderness.  Musculoskeletal: Normal range of motion Neurological:  normal muscle tone. Coordination normal.  No atrophy Skin: Skin warm and dry Psychiatric: normal affect, pleasant   Recent Labs: 12/05/2022: ALT 22; BUN 17; Creat 0.88; Hemoglobin 14.5; Platelets 279; Potassium 4.8; Sodium 141    Lipid Panel Lab Results  Component Value Date   CHOL 121 04/15/2022   HDL 51 04/15/2022   LDLCALC 51 04/15/2022   TRIG 100 04/15/2022    Wt Readings from Last 3 Encounters:  03/25/23 233 lb 2 oz (105.7 kg)  02/11/23 231 lb 9.6 oz (105.1 kg)  12/05/22 233 lb 4.8 oz (105.8 kg)     ASSESSMENT AND PLAN:  Problem List Items Addressed This Visit       Cardiology Problems   Essential hypertension   Relevant Orders   EKG 12-Lead (Completed)   Paroxysmal tachycardia (HCC)   Relevant Orders   EKG 12-Lead (Completed)  Chronic diastolic heart failure (HCC) - Primary   Relevant Orders   EKG 12-Lead (Completed)   Other Visit Diagnoses     Palpitations       Relevant Orders   EKG 12-Lead (Completed)   Swelling of lower leg           SVT, PVCs No significant symptoms of arrhythmia, Well-controlled on current dose of metoprolol  Neuropathy Managed by primary care  Hypertension Blood pressure is well controlled on today's visit. No changes made to the medications.  Hyperlipidemia Cholesterol is at goal on the current lipid regimen. No changes to the medications were made. On Crestor and Zetia  Morbid obesity BMI 40 We have encouraged continued exercise, careful diet management in an effort to lose weight.  Ankle swelling Recommended leg elevation, compression hose If no improvement, may need Lasix 1 or 2 times a week as needed She will call if she would like prescription for Lasix   Total encounter time more than 30 minutes  Greater than 50% was spent in counseling and coordination of care with the patient   Signed, Dossie Arbour, M.D., Ph.D. [facility] [place], [place] [phone]

## 2023-03-25 ENCOUNTER — Ambulatory Visit: Payer: Medicare HMO | Attending: Cardiovascular Disease | Admitting: Cardiovascular Disease

## 2023-03-25 ENCOUNTER — Encounter: Payer: Self-pay | Admitting: Cardiovascular Disease

## 2023-03-25 VITALS — BP 130/76 | HR 81 | Ht 64.0 in | Wt 233.1 lb

## 2023-03-25 DIAGNOSIS — E785 Hyperlipidemia, unspecified: Secondary | ICD-10-CM

## 2023-03-25 DIAGNOSIS — I5032 Chronic diastolic (congestive) heart failure: Secondary | ICD-10-CM

## 2023-03-25 DIAGNOSIS — R002 Palpitations: Secondary | ICD-10-CM

## 2023-03-25 DIAGNOSIS — I479 Paroxysmal tachycardia, unspecified: Secondary | ICD-10-CM

## 2023-03-25 DIAGNOSIS — I700 Atherosclerosis of aorta: Secondary | ICD-10-CM | POA: Diagnosis not present

## 2023-03-25 DIAGNOSIS — M7989 Other specified soft tissue disorders: Secondary | ICD-10-CM | POA: Diagnosis not present

## 2023-03-25 DIAGNOSIS — I10 Essential (primary) hypertension: Secondary | ICD-10-CM

## 2023-03-25 MED ORDER — METOPROLOL TARTRATE 25 MG PO TABS: 25.0000 mg | ORAL_TABLET | Freq: Two times a day (BID) | ORAL | 3 refills | Status: DC

## 2023-03-25 MED ORDER — EZETIMIBE 10 MG PO TABS: 10.0000 mg | ORAL_TABLET | Freq: Every day | ORAL | 3 refills | Status: DC

## 2023-03-25 NOTE — Patient Instructions (Signed)

## 2023-03-31 ENCOUNTER — Ambulatory Visit
Admission: RE | Admit: 2023-03-31 | Discharge: 2023-03-31 | Disposition: A | Discharge status: Home / Self Care | Payer: Medicare HMO | Source: Ambulatory Visit | Attending: Family Medicine

## 2023-03-31 ENCOUNTER — Ambulatory Visit
Admission: RE | Admit: 2023-03-31 | Discharge: 2023-03-31 | Disposition: A | Discharge status: Home / Self Care | Payer: Medicare HMO | Source: Ambulatory Visit | Attending: Family Medicine | Admitting: Family Medicine

## 2023-03-31 DIAGNOSIS — Z780 Asymptomatic menopausal state: Secondary | ICD-10-CM | POA: Diagnosis not present

## 2023-03-31 DIAGNOSIS — Z1231 Encounter for screening mammogram for malignant neoplasm of breast: Secondary | ICD-10-CM

## 2023-03-31 DIAGNOSIS — M85851 Other specified disorders of bone density and structure, right thigh: Secondary | ICD-10-CM | POA: Diagnosis not present

## 2023-04-03 ENCOUNTER — Ambulatory Visit (INDEPENDENT_AMBULATORY_CARE_PROVIDER_SITE_OTHER): Payer: Medicare HMO

## 2023-04-03 DIAGNOSIS — E538 Deficiency of other specified B group vitamins: Secondary | ICD-10-CM

## 2023-04-03 MED ORDER — CYANOCOBALAMIN 1000 MCG/ML IJ SOLN
1000.0000 ug | Freq: Once | INTRAMUSCULAR | Status: AC
Start: 2023-04-03 — End: 2023-04-03
  Administered 2023-04-03: 1000 ug via INTRAMUSCULAR

## 2023-04-04 ENCOUNTER — Other Ambulatory Visit: Payer: Self-pay | Admitting: Physician Assistant

## 2023-04-04 DIAGNOSIS — I700 Atherosclerosis of aorta: Secondary | ICD-10-CM

## 2023-04-04 DIAGNOSIS — E785 Hyperlipidemia, unspecified: Secondary | ICD-10-CM

## 2023-04-04 NOTE — Telephone Encounter (Signed)
Requested medication (s) are due for refill today:   Yes  Requested medication (s) are on the active medication list:   Yes  Future visit scheduled:   No   Seen 4 mo. ago   Last ordered: 07/16/2022 #90, 2 refills  Unable to refill due to a lipid panel is due per protocol.   Requested Prescriptions  Pending Prescriptions Disp Refills   rosuvastatin (CRESTOR) 10 MG tablet [Pharmacy Med Name: ROSUVASTATIN CALCIUM 10 MG TAB] 30 tablet     Sig: TAKE 1 TABLET BY MOUTH ONCE DAILY     Cardiovascular:  Antilipid - Statins 2 Failed - 04/04/2023  1:27 AM      Failed - Lipid Panel in normal range within the last 12 months    Cholesterol, Total  Date Value Ref Range Status  04/12/2021 126 100 - 199 mg/dL Final   Cholesterol  Date Value Ref Range Status  04/15/2022 121 <200 mg/dL Final   LDL Cholesterol (Calc)  Date Value Ref Range Status  04/15/2022 51 mg/dL (calc) Final    Comment:    Reference range: <100 . Desirable range <100 mg/dL for primary prevention;   <70 mg/dL for patients with CHD or diabetic patients  with > or = 2 CHD risk factors. Marland Kitchen LDL-C is now calculated using the Martin-Hopkins  calculation, which is a validated novel method providing  better accuracy than the Friedewald equation in the  estimation of LDL-C.  Horald Pollen et al. Lenox Ahr. 1610;960(45): 2061-2068  (http://education.[employer].com/faq/FAQ164)    Direct LDL  Date Value Ref Range Status  12/25/2006 149.5 mg/dL Final    Comment:    See lab report for associated comment(s)   HDL  Date Value Ref Range Status  04/15/2022 51 > OR = 50 mg/dL Final  40/98/1191 47 >47 mg/dL Final   Triglycerides  Date Value Ref Range Status  04/15/2022 100 <150 mg/dL Final         Passed - Cr in normal range and within 360 days    Creat  Date Value Ref Range Status  12/05/2022 0.88 0.60 - 1.00 mg/dL Final   Creatinine, Urine  Date Value Ref Range Status  04/15/2022 87 20 - 275 mg/dL Final         Passed -  Patient is not pregnant      Passed - Valid encounter within last 12 months    Recent Outpatient Visits           4 months ago Essential hypertension   [facility] Danelle Berry, PA-C   11 months ago Essential hypertension   [facility] Danelle Berry, PA-C   1 year ago Type 2 diabetes mellitus with diabetic polyneuropathy, without long-term current use of insulin [facility])   [facility] Danelle Berry, PA-C   1 year ago Myalgia   [facility] Danelle Berry, PA-C   1 year ago Essential hypertension   [facility] Danelle Berry, PA-C       Future Appointments             In 7 months Willeen Niece, MD [facility]

## 2023-04-09 ENCOUNTER — Encounter: Payer: Self-pay | Admitting: Gastroenterology

## 2023-04-11 ENCOUNTER — Other Ambulatory Visit: Payer: Self-pay | Admitting: Family Medicine

## 2023-04-11 DIAGNOSIS — Z1211 Encounter for screening for malignant neoplasm of colon: Secondary | ICD-10-CM

## 2023-04-11 DIAGNOSIS — Z1212 Encounter for screening for malignant neoplasm of rectum: Secondary | ICD-10-CM

## 2023-04-16 ENCOUNTER — Ambulatory Visit
Admission: RE | Admit: 2023-04-16 | Discharge: 2023-04-16 | Disposition: A | Discharge status: Home / Self Care | Payer: Medicare HMO | Source: Ambulatory Visit | Attending: Gastroenterology | Admitting: Gastroenterology

## 2023-04-16 ENCOUNTER — Other Ambulatory Visit: Payer: Self-pay

## 2023-04-16 ENCOUNTER — Encounter: Payer: Self-pay | Admitting: Gastroenterology

## 2023-04-16 ENCOUNTER — Encounter
Admission: RE | Disposition: A | Discharge status: Home / Self Care | Payer: Self-pay | Source: Ambulatory Visit | Attending: Gastroenterology

## 2023-04-16 ENCOUNTER — Ambulatory Visit: Payer: Medicare HMO | Admitting: Anesthesiology

## 2023-04-16 DIAGNOSIS — I341 Nonrheumatic mitral (valve) prolapse: Secondary | ICD-10-CM | POA: Insufficient documentation

## 2023-04-16 DIAGNOSIS — I10 Essential (primary) hypertension: Secondary | ICD-10-CM | POA: Insufficient documentation

## 2023-04-16 DIAGNOSIS — Z1211 Encounter for screening for malignant neoplasm of colon: Secondary | ICD-10-CM

## 2023-04-16 DIAGNOSIS — K649 Unspecified hemorrhoids: Secondary | ICD-10-CM | POA: Diagnosis not present

## 2023-04-16 DIAGNOSIS — Z87891 Personal history of nicotine dependence: Secondary | ICD-10-CM | POA: Insufficient documentation

## 2023-04-16 DIAGNOSIS — Z7982 Long term (current) use of aspirin: Secondary | ICD-10-CM | POA: Insufficient documentation

## 2023-04-16 DIAGNOSIS — K635 Polyp of colon: Secondary | ICD-10-CM

## 2023-04-16 DIAGNOSIS — K5730 Diverticulosis of large intestine without perforation or abscess without bleeding: Secondary | ICD-10-CM | POA: Diagnosis not present

## 2023-04-16 DIAGNOSIS — K640 First degree hemorrhoids: Secondary | ICD-10-CM | POA: Diagnosis not present

## 2023-04-16 DIAGNOSIS — E785 Hyperlipidemia, unspecified: Secondary | ICD-10-CM | POA: Insufficient documentation

## 2023-04-16 DIAGNOSIS — R7303 Prediabetes: Secondary | ICD-10-CM | POA: Diagnosis not present

## 2023-04-16 DIAGNOSIS — D120 Benign neoplasm of cecum: Secondary | ICD-10-CM

## 2023-04-16 DIAGNOSIS — Z79899 Other long term (current) drug therapy: Secondary | ICD-10-CM | POA: Insufficient documentation

## 2023-04-16 DIAGNOSIS — K529 Noninfective gastroenteritis and colitis, unspecified: Secondary | ICD-10-CM | POA: Insufficient documentation

## 2023-04-16 DIAGNOSIS — R197 Diarrhea, unspecified: Secondary | ICD-10-CM

## 2023-04-16 HISTORY — DX: Prediabetes: R73.03

## 2023-04-16 HISTORY — PX: BIOPSY: SHX5522

## 2023-04-16 HISTORY — PX: COLONOSCOPY WITH PROPOFOL: SHX5780

## 2023-04-16 HISTORY — PX: POLYPECTOMY: SHX5525

## 2023-04-16 SURGERY — COLONOSCOPY WITH PROPOFOL
Anesthesia: General

## 2023-04-16 MED ORDER — SODIUM CHLORIDE 0.9 % IV SOLN
INTRAVENOUS | Status: DC | PRN
Start: 2023-04-16 — End: 2023-04-16

## 2023-04-16 MED ORDER — PROPOFOL 10 MG/ML IV BOLUS
INTRAVENOUS | Status: DC | PRN
  Administered 2023-04-16: 30 mg via INTRAVENOUS
  Administered 2023-04-16: 70 mg via INTRAVENOUS
  Administered 2023-04-16: 30 mg via INTRAVENOUS
  Administered 2023-04-16: 20 mg via INTRAVENOUS

## 2023-04-16 MED ORDER — PROPOFOL 500 MG/50ML IV EMUL
INTRAVENOUS | Status: DC | PRN
  Administered 2023-04-16: 155 ug/kg/min via INTRAVENOUS

## 2023-04-16 MED ORDER — SODIUM CHLORIDE 0.9 % IV SOLN: INTRAVENOUS | Status: DC

## 2023-04-16 MED ORDER — LIDOCAINE HCL (CARDIAC) PF 100 MG/5ML IV SOSY
PREFILLED_SYRINGE | INTRAVENOUS | Status: DC | PRN
  Administered 2023-04-16: 100 mg via INTRAVENOUS

## 2023-04-16 NOTE — Anesthesia Postprocedure Evaluation (Signed)
Anesthesia Post Note  Patient: Jacqueline [name]  Procedure(s) Performed: COLONOSCOPY WITH PROPOFOL POLYPECTOMY BIOPSY  Patient location during evaluation: Endoscopy Anesthesia Type: General Level of consciousness: awake and alert Pain management: pain level controlled Vital Signs Assessment: post-procedure vital signs reviewed and stable Respiratory status: spontaneous breathing, nonlabored ventilation, respiratory function stable and patient connected to nasal cannula oxygen Cardiovascular status: blood pressure returned to baseline and stable Postop Assessment: no apparent nausea or vomiting Anesthetic complications: no   No notable events documented.   Last Vitals:  Vitals:   04/16/23 0756 04/16/23 0806  BP: 124/74 (!) 145/88  Pulse: 70 72  Resp: (!) 27 14  Temp:    SpO2: 96% 96%    Last Pain:  Vitals:   04/16/23 0806  TempSrc:   PainSc: 0-No pain                 Lenard Simmer

## 2023-04-16 NOTE — Anesthesia Procedure Notes (Signed)
Procedure Name: General with mask airway Date/Time: 04/16/2023 7:28 AM  Performed by: Mohammed Kindle, CRNAPre-anesthesia Checklist: Patient identified, Emergency Drugs available, Suction available and Patient being monitored Patient Re-evaluated:Patient Re-evaluated prior to induction Oxygen Delivery Method: Simple face mask Induction Type: IV induction Placement Confirmation: positive ETCO2, CO2 detector and breath sounds checked- equal and bilateral Dental Injury: Teeth and Oropharynx as per pre-operative assessment

## 2023-04-16 NOTE — Op Note (Addendum)
[facility] Patient Name: Jacqueline [name] Procedure Date: 04/16/2023 6:56 AM MRN: [id number] Account #: [id number] Date of Birth: 1951/04/03 Admit Type: Outpatient Age: 73 Room: [facility] ENDO ROOM 1 Gender: Female Note Status: Finalized Instrument Name: Prentice Docker [id number] Procedure:             Colonoscopy Indications:           Chronic diarrhea Providers:             Midge Minium MD, MD Referring MD:          Danelle Berry (Referring MD) Medicines:             Propofol per Anesthesia Complications:         No immediate complications. Procedure:             Pre-Anesthesia Assessment:                        - Prior to the procedure, a History and Physical was                         performed, and patient medications and allergies were                         reviewed. The patient's tolerance of previous                         anesthesia was also reviewed. The risks and benefits                         of the procedure and the sedation options and risks                         were discussed with the patient. All questions were                         answered, and informed consent was obtained. Prior                         Anticoagulants: The patient has taken no anticoagulant                         or antiplatelet agents. ASA Grade Assessment: II - A                         patient with mild systemic disease. After reviewing                         the risks and benefits, the patient was deemed in                         satisfactory condition to undergo the procedure.                        After obtaining informed consent, the colonoscope was                         passed under direct vision. Throughout the procedure,  the patient's blood pressure, pulse, and oxygen                         saturations were monitored continuously. The                         Colonoscope was introduced through the anus and                          advanced to the the terminal ileum. The colonoscopy                         was performed without difficulty. The patient                         tolerated the procedure well. The quality of the bowel                         preparation was excellent. Findings:      The perianal and digital rectal examinations were normal.      Two sessile polyps were found in the cecum. The polyps were 2 to 3 mm in       size. These polyps were removed with a cold snare. Resection and       retrieval were complete.      Multiple small-mouthed diverticula were found in the entire colon.      Non-bleeding internal hemorrhoids were found during retroflexion. The       hemorrhoids were Grade I (internal hemorrhoids that do not prolapse).      Random biopsies were obtained with cold forceps for histology randomly       in the entire colon and in the terminal ileum. Impression:            - Two 2 to 3 mm polyps in the cecum, removed with a                         cold snare. Resected and retrieved.                        - Diverticulosis in the entire examined colon.                        - Non-bleeding internal hemorrhoids.                        - Random biopsies were obtained in the entire colon                         and in the terminal ileum. Recommendation:        - Discharge patient to home.                        - Resume previous diet.                        - Continue present medications.                        - Await pathology results.                        -  If the pathology report reveals adenomatous tissue,                         then repeat the colonoscopy for surveillance in 7                         years. Procedure Code(s):     --- Professional ---                        [phone], Colonoscopy, flexible; with removal of                         tumor(s), polyp(s), or other lesion(s) by snare                         technique                        45380, 59, Colonoscopy, flexible; with  biopsy, single                         or multiple Diagnosis Code(s):     --- Professional ---                        K52.9, Noninfective gastroenteritis and colitis,                         unspecified                        D12.0, Benign neoplasm of cecum CPT copyright 2022 American Medical Association. All rights reserved. The codes documented in this report are preliminary and upon coder review may  be revised to meet current compliance requirements. Midge Minium MD, MD 04/16/2023 7:44:03 AM This report has been signed electronically. Number of Addenda: 0 Note Initiated On: 04/16/2023 6:56 AM Scope Withdrawal Time: 0 hours 10 minutes 1 second  Total Procedure Duration: 0 hours 14 minutes 22 seconds  Estimated Blood Loss:  Estimated blood loss: none.      [facility]

## 2023-04-16 NOTE — Transfer of Care (Signed)
Immediate Anesthesia Transfer of Care Note  Patient: Jacqueline [name]  Procedure(s) Performed: COLONOSCOPY WITH PROPOFOL POLYPECTOMY BIOPSY  Patient Location: Endoscopy Unit  Anesthesia Type:General  Level of Consciousness: awake, drowsy, and patient cooperative  Airway & Oxygen Therapy: Patient Spontanous Breathing and Patient connected to face mask oxygen  Post-op Assessment: Report given to RN and Post -op Vital signs reviewed and stable  Post vital signs: Reviewed and stable  Last Vitals:  Vitals Value Taken Time  BP 103/80 04/16/23 0746  Temp 36.2 C 04/16/23 0746  Pulse 89 04/16/23 0747  Resp 21 04/16/23 0747  SpO2 98 % 04/16/23 0747  Vitals shown include unfiled device data.  Last Pain:  Vitals:   04/16/23 0746  TempSrc: Temporal  PainSc: 0-No pain         Complications: No notable events documented.

## 2023-04-16 NOTE — H&P (Signed)
Midge Minium, MD [facility] [address], [address] [place], [place] [address] Phone:[phone] Fax : [phone]  Primary Care Physician:  Danelle Berry, PA-C Primary Gastroenterologist:  Dr. Servando Snare  Pre-Procedure History & Physical: HPI:  Jacqueline [name] is a 72 y.o. female is here for an colonoscopy.   Past Medical History:  Diagnosis Date   Calcium oxalate renal stones    Hyperlipidemia    Hypertension    MVP (mitral valve prolapse)    Neuropathy 11/23/2019   Pre-diabetes     Past Surgical History:  Procedure Laterality Date   BREAST BIOPSY Right 08/19/2012   neg, Dr. Lemar Livings   BREAST BIOPSY Left 04/30/2022   [place] bx, ribbon marker,RUPTURED DUCT ECTASIA, FLORID DUCTAL HYPERPLASIA, COLUMNAR CELL CHANGE, AND APOCRINE METAPLASIA   extraction of renal stones     TONSILLECTOMY AND ADENOIDECTOMY     VAGINAL HYSTERECTOMY  1980   class IV, ovaries intact    Prior to Admission medications   Medication Sig Start Date End Date Taking? Authorizing Provider  aspirin 81 MG chewable tablet Chew 81 mg by mouth daily.   Yes [provider]  calcium carbonate (OS-CAL) 600 MG TABS tablet Take 600 mg by mouth 2 (two) times daily with a meal. 10/13/18  Yes Baity, Salvadore [place], NP  Cholecalciferol (VITAMIN D) 50 MCG (2000 UT) CAPS Take 1 capsule by mouth daily. 10/13/18  Yes Lorre Munroe, NP  ezetimibe (ZETIA) 10 MG tablet Take 1 tablet (10 mg total) by mouth daily. 03/25/23  Yes Antonieta Iba, MD  gabapentin (NEURONTIN) 300 MG capsule Take 1 tablet in the AM and 2 tablets in the evening 04/15/22  Yes Tapia, Leisa, PA-C  lisinopril (ZESTRIL) 10 MG tablet Take 1 tablet (10 mg total) by mouth daily. 12/05/22  Yes Danelle Berry, PA-C  Magnesium Oxide 500 MG CAPS Take 1 capsule by mouth daily.   Yes [provider]  metoprolol tartrate (LOPRESSOR) 25 MG tablet Take 1 tablet (25 mg total) by mouth 2 (two) times daily. 03/25/23  Yes Antonieta Iba, MD  Potassium Gluconate 595 MG CAPS  Take 595 mg by mouth daily.   Yes [provider]  rosuvastatin (CRESTOR) 10 MG tablet TAKE 1 TABLET BY MOUTH ONCE DAILY 04/04/23  Yes Danelle Berry, PA-C  VITAMIN E PO Take 800 Units by mouth daily.   Yes [provider]  zinc gluconate 50 MG tablet Take 50 mg by mouth daily.   Yes [provider]  blood glucose meter kit and supplies KIT Lifescan meter Check blood sugar 2 times daily as needed Patient not taking: Reported on 03/25/2023 04/05/19   Lorre Munroe, NP  Continuous Blood Gluc Sensor (FREESTYLE LIBRE 3 SENSOR) MISC 1 Device by Does not apply route every 14 (fourteen) days. 03/14/22   Danelle Berry, PA-C  Lancets (ONETOUCH DELICA PLUS [place]) MISC USE AS DIRECTED TWICE A DAY AS NEEDED Patient not taking: Reported on 03/25/2023 08/23/19   Lorre Munroe, NP  [facility] ULTRA test strip USE AS DIRECTED TWICE A DAY AS NEEDED Patient not taking: Reported on 03/25/2023 09/14/21   Mecum, Erin E, PA-C  traZODone (DESYREL) 50 MG tablet TAKE ONE TABLET BY MOUTH EVERYDAY AT BEDTIME 12/05/22   Danelle Berry, PA-C    Allergies as of 03/20/2023   (No Known Allergies)    Family History  Problem Relation Age of Onset   Hypertension Mother    Dementia Mother    Stroke Mother    Cancer -  Other Father        Bile Duct   Heart disease Maternal Grandmother    Heart disease Maternal Grandfather    Hypertension Paternal Grandmother    Cancer Maternal Aunt        breast and lung   Stroke Paternal Grandfather     Social History   Socioeconomic History   Marital status: Married    Spouse name: Brett Canales   Number of children: 2   Years of education: Not on file   Highest education level: Some college, no degree  Occupational History   Occupation: Museum/gallery exhibitions officer  Tobacco Use   Smoking status: Former    Current packs/day: 0.00    Average packs/day: 0.3 packs/day for 30.9 years (7.7 ttl pk-yrs)    Types: Cigarettes    Start date: 07/09/1983    Quit date: 05/27/2014     Years since quitting: 8.8   Smokeless tobacco: Never  Vaping Use   Vaping status: Never Used  Substance and Sexual Activity   Alcohol use: Yes    Alcohol/week: 4.0 standard drinks of alcohol    Types: 4 Glasses of wine per week    Comment: occasional   Drug use: No   Sexual activity: Yes    Partners: Male  Other Topics Concern   Not on file  Social History Narrative   Not on file   Social Determinants of Health   Financial Resource Strain: Low Risk  (12/04/2022)   Overall Financial Resource Strain (CARDIA)    Difficulty of Paying Living Expenses: Not hard at all  Food Insecurity: No Food Insecurity (12/04/2022)   Hunger Vital Sign    Worried About Running Out of Food in the Last Year: Never true    Ran Out of Food in the Last Year: Never true  Transportation Needs: No Transportation Needs (12/04/2022)   PRAPARE - Administrator, Civil Service (Medical): No    Lack of Transportation (Non-Medical): No  Physical Activity: Insufficiently Active (12/04/2022)   Exercise Vital Sign    Days of Exercise per Week: 2 days    Minutes of Exercise per Session: 20 min  Stress: No Stress Concern Present (12/04/2022)   [employer] of Occupational Health - Occupational Stress Questionnaire    Feeling of Stress : Only a little  Social Connections: Socially Integrated (12/04/2022)   Social Connection and Isolation Panel [NHANES]    Frequency of Communication with Friends and Family: More than three times a week    Frequency of Social Gatherings with Friends and Family: Once a week    Attends Religious Services: More than 4 times per year    Active Member of [employer] or Organizations: No    Attends Engineer, structural: 1 to 4 times per year    Marital Status: Married  Catering manager Violence: Not At Risk (08/15/2022)   Humiliation, Afraid, Rape, and Kick questionnaire    Fear of Current or Ex-Partner: No    Emotionally Abused: No    Physically Abused: No    Sexually  Abused: No    Review of Systems: See HPI, otherwise negative ROS  Physical Exam: BP (!) 173/78   Pulse 67   Temp 98 F (36.7 C) (Temporal)   Resp 16   Ht 5\' 4"  (1.626 m)   Wt 105.1 kg   SpO2 98%   BMI 39.75 kg/m  General:   Alert,  pleasant and cooperative in NAD Head:  Normocephalic and atraumatic. Neck:  Supple;  no masses or thyromegaly. Lungs:  Clear throughout to auscultation.    Heart:  Regular rate and rhythm. Abdomen:  Soft, nontender and nondistended. Normal bowel sounds, without guarding, and without rebound.   Neurologic:  Alert and  oriented x4;  grossly normal neurologically.  Impression/Plan: Jacqueline [name] [facility] is here for an colonoscopy to be performed for diarrhea  Risks, benefits, limitations, and alternatives regarding  colonoscopy have been reviewed with the patient.  Questions have been answered.  All parties agreeable.   Midge Minium, MD  04/16/2023, 7:16 AM

## 2023-04-16 NOTE — Anesthesia Preprocedure Evaluation (Signed)
Anesthesia Evaluation  Patient identified by MRN, date of birth, ID band Patient awake    Reviewed: Allergy & Precautions, H&P , NPO status , Patient's Chart, lab work & pertinent test results, reviewed documented beta blocker date and time   History of Anesthesia Complications Negative for: history of anesthetic complications  Airway Mallampati: II  TM Distance: >3 FB Neck ROM: full    Dental  (+) Dental Advidsory Given, Caps, Teeth Intact   Pulmonary neg pulmonary ROS, Continuous Positive Airway Pressure Ventilation , former smoker   Pulmonary exam normal breath sounds clear to auscultation       Cardiovascular Exercise Tolerance: Good hypertension, (-) angina (-) Past MI and (-) Cardiac Stents Normal cardiovascular exam(-) dysrhythmias + Valvular Problems/Murmurs MVP  Rhythm:regular Rate:Normal     Neuro/Psych negative neurological ROS  negative psych ROS   GI/Hepatic negative GI ROS, Neg liver ROS,,,  Endo/Other  diabetes (borderline)  Morbid obesity  Renal/GU CRFRenal disease  negative genitourinary   Musculoskeletal   Abdominal   Peds  Hematology negative hematology ROS (+)   Anesthesia Other Findings Past Medical History: No date: Calcium oxalate renal stones No date: Hyperlipidemia No date: Hypertension No date: MVP (mitral valve prolapse) 11/23/2019: Neuropathy No date: Pre-diabetes   Reproductive/Obstetrics negative OB ROS                             Anesthesia Physical Anesthesia Plan  ASA: 3  Anesthesia Plan: General   Post-op Pain Management:    Induction: Intravenous  PONV Risk Score and Plan: 3 and Propofol infusion, TIVA and Treatment may vary due to age or medical condition  Airway Management Planned: Natural Airway and Nasal Cannula  Additional Equipment:   Intra-op Plan:   Post-operative Plan:   Informed Consent: I have reviewed the patients History  and Physical, chart, labs and discussed the procedure including the risks, benefits and alternatives for the proposed anesthesia with the patient or authorized representative who has indicated his/her understanding and acceptance.     Dental Advisory Given  Plan Discussed with: Anesthesiologist, CRNA and Surgeon  Anesthesia Plan Comments:        Anesthesia Quick Evaluation

## 2023-04-17 ENCOUNTER — Encounter: Payer: Self-pay | Admitting: Gastroenterology

## 2023-04-17 LAB — SURGICAL PATHOLOGY

## 2023-04-19 ENCOUNTER — Encounter: Payer: Self-pay | Admitting: Gastroenterology

## 2023-05-01 ENCOUNTER — Ambulatory Visit (INDEPENDENT_AMBULATORY_CARE_PROVIDER_SITE_OTHER): Payer: Medicare HMO

## 2023-05-01 DIAGNOSIS — E538 Deficiency of other specified B group vitamins: Secondary | ICD-10-CM

## 2023-05-01 MED ORDER — CYANOCOBALAMIN 1000 MCG/ML IJ SOLN
1000.0000 ug | Freq: Once | INTRAMUSCULAR | Status: AC
  Administered 2023-05-01: 1000 ug via INTRAMUSCULAR

## 2023-05-06 ENCOUNTER — Other Ambulatory Visit: Payer: Self-pay | Admitting: Family Medicine

## 2023-05-06 DIAGNOSIS — I700 Atherosclerosis of aorta: Secondary | ICD-10-CM

## 2023-05-06 DIAGNOSIS — E785 Hyperlipidemia, unspecified: Secondary | ICD-10-CM

## 2023-05-11 ENCOUNTER — Other Ambulatory Visit: Payer: Self-pay | Admitting: Cardiovascular Disease

## 2023-05-11 DIAGNOSIS — I700 Atherosclerosis of aorta: Secondary | ICD-10-CM

## 2023-05-11 DIAGNOSIS — E785 Hyperlipidemia, unspecified: Secondary | ICD-10-CM

## 2023-05-29 ENCOUNTER — Ambulatory Visit (INDEPENDENT_AMBULATORY_CARE_PROVIDER_SITE_OTHER): Payer: Medicare HMO

## 2023-05-29 DIAGNOSIS — E538 Deficiency of other specified B group vitamins: Secondary | ICD-10-CM

## 2023-05-29 MED ORDER — CYANOCOBALAMIN 1000 MCG/ML IJ SOLN
1000.0000 ug | Freq: Once | INTRAMUSCULAR | Status: AC
Start: 2023-05-29 — End: 2023-05-29
  Administered 2023-05-29: 1000 ug via INTRAMUSCULAR

## 2023-06-03 ENCOUNTER — Ambulatory Visit (INDEPENDENT_AMBULATORY_CARE_PROVIDER_SITE_OTHER): Payer: Medicare HMO | Admitting: Physician Assistant

## 2023-06-03 ENCOUNTER — Encounter: Payer: Self-pay | Admitting: Physician Assistant

## 2023-06-03 VITALS — BP 130/76 | HR 74 | Temp 98.3°F | Resp 16 | Ht 64.0 in | Wt 231.8 lb

## 2023-06-03 DIAGNOSIS — E782 Mixed hyperlipidemia: Secondary | ICD-10-CM

## 2023-06-03 DIAGNOSIS — Z23 Encounter for immunization: Secondary | ICD-10-CM | POA: Diagnosis not present

## 2023-06-03 DIAGNOSIS — E66812 Obesity, class 2: Secondary | ICD-10-CM

## 2023-06-03 DIAGNOSIS — E1142 Type 2 diabetes mellitus with diabetic polyneuropathy: Secondary | ICD-10-CM

## 2023-06-03 DIAGNOSIS — I5032 Chronic diastolic (congestive) heart failure: Secondary | ICD-10-CM | POA: Diagnosis not present

## 2023-06-03 DIAGNOSIS — N1831 Chronic kidney disease, stage 3a: Secondary | ICD-10-CM

## 2023-06-03 DIAGNOSIS — I10 Essential (primary) hypertension: Secondary | ICD-10-CM

## 2023-06-03 DIAGNOSIS — Z6839 Body mass index (BMI) 39.0-39.9, adult: Secondary | ICD-10-CM | POA: Diagnosis not present

## 2023-06-03 NOTE — Telephone Encounter (Signed)
Copied from CRM [id number]. Topic: General - Other >> Jun 03, 2023  1:07 PM Phill Myron wrote: Both Ozempic and Mounjaro are covered by my insurance

## 2023-06-03 NOTE — Progress Notes (Unsigned)
Established Patient Office Visit  Name: Jacqueline [name] [facility]   MRN: [id number]    DOB: 01-29-1951   Date:06/04/2023  Today's Provider: Jacquelin Hawking, MHS, PA-C Introduced myself to the patient as a PA-C and provided education on APPs in clinical practice.         Subjective  Chief Complaint  Chief Complaint  Patient presents with   Hypertension   Hyperlipidemia   Diabetes    HPI   HYPERTENSION / HYPERLIPIDEMIA/ CHF  Satisfied with current treatment? yes Duration of hypertension: years BP monitoring frequency: rarely BP range: unsure  BP medication side effects: no Past BP meds: Lisinopril and Metoprolol  Duration of hyperlipidemia: years Cholesterol medication side effects: no Cholesterol supplements: none Past cholesterol medications: rosuvastatin (crestor) and ezetimide (zetia) Medication compliance: good compliance Aspirin: yes Recent stressors: no Recurrent headaches: no Visual changes: no Palpitations: no Dyspnea: no Chest pain: no Lower extremity edema: no Dizzy/lightheaded: no   Diabetes, Type 2 - Last A1c 6.8%  - Medications: She is not on DM medications at this time  - Compliance: NA - Checking BG at home: occasionally checking, not routine   - Diet: She reports she tries to eat a healthy breakfast- yogurt, fruit- lunch is more difficult as she is working and on the road- usually gets grilled chicken from [employer]. Dinner - tries to get protein, vegetables, starch. She does admit to increased snacking and eating a lot of fruit  - Exercise: she is trying to walk everyday (getting 5000-6000k steps per day) - Eye exam: Referral placed today  - Foot exam: completed today  - Microalbumin: ordered today  - Statin: on statin  - PNA vaccine: completed  - Denies symptoms of hypoglycemia, polyuria, polydipsia, numbness extremities, foot ulcers/trauma  She is concerned about her weight- she reports pain in her back and knees  States she has not been  able to lose weight on her own despite exercise efforts and attempts at dieting    Patient Active Problem List   Diagnosis Date Noted   Diarrhea 04/16/2023   Polyp of colon 04/16/2023   Aortic atherosclerosis (HCC) 12/05/2022   Chronic diastolic heart failure (HCC) 10/12/2021   Paroxysmal tachycardia (HCC) 10/12/2021   Vitamin D deficiency 10/12/2021   DM (diabetes mellitus), type 2 (HCC) 10/12/2021   Peripheral edema 12/28/2019   Neuropathy 11/23/2019   B12 deficiency 09/23/2019   Stage 3a chronic kidney disease (HCC) 09/23/2019   History of kidney stones 10/14/2018   Spinal stenosis of lumbar region 08/11/2009   Essential hypertension 01/14/2008   Class 3 severe obesity with serious comorbidity and body mass index (BMI) of 40.0 to 44.9 in adult (HCC) 12/25/2006   HLD (hyperlipidemia) 11/11/2006   Osteopenia 11/11/2006    Past Surgical History:  Procedure Laterality Date   BIOPSY  04/16/2023   Procedure: BIOPSY;  Surgeon: Midge Minium, MD;  Location: [facility] ENDOSCOPY;  Service: Endoscopy;;   BREAST BIOPSY Right 08/19/2012   neg, Dr. Lemar Livings   BREAST BIOPSY Left 04/30/2022   [place] bx, ribbon marker,RUPTURED DUCT ECTASIA, FLORID DUCTAL HYPERPLASIA, COLUMNAR CELL CHANGE, AND APOCRINE METAPLASIA   COLONOSCOPY WITH PROPOFOL N/A 04/16/2023   Procedure: COLONOSCOPY WITH PROPOFOL;  Surgeon: Midge Minium, MD;  Location: [facility] ENDOSCOPY;  Service: Endoscopy;  Laterality: N/A;   extraction of renal stones     POLYPECTOMY  04/16/2023   Procedure: POLYPECTOMY;  Surgeon: Midge Minium, MD;  Location: [facility] ENDOSCOPY;  Service: Endoscopy;;   TONSILLECTOMY  AND ADENOIDECTOMY     VAGINAL HYSTERECTOMY  1980   class IV, ovaries intact    Family History  Problem Relation Age of Onset   Hypertension Mother    Dementia Mother    Stroke Mother    Cancer - Other Father        Bile Duct   Heart disease Maternal Grandmother    Heart disease Maternal Grandfather    Hypertension Paternal Grandmother     Cancer Maternal Aunt        breast and lung   Stroke Paternal Grandfather     Social History   Tobacco Use   Smoking status: Former    Current packs/day: 0.00    Average packs/day: 0.3 packs/day for 30.9 years (7.7 ttl pk-yrs)    Types: Cigarettes    Start date: 07/09/1983    Quit date: 05/27/2014    Years since quitting: 9.0   Smokeless tobacco: Never  Substance Use Topics   Alcohol use: Yes    Alcohol/week: 4.0 standard drinks of alcohol    Types: 4 Glasses of wine per week    Comment: occasional     Current Outpatient Medications:    aspirin 81 MG chewable tablet, Chew 81 mg by mouth daily., Disp: , Rfl:    blood glucose meter kit and supplies KIT, Lifescan meter Check blood sugar 2 times daily as needed, Disp: 1 each, Rfl: 0   calcium carbonate (OS-CAL) 600 MG TABS tablet, Take 600 mg by mouth 2 (two) times daily with a meal., Disp: 30 tablet, Rfl: 0   Cholecalciferol (VITAMIN D) 50 MCG (2000 UT) CAPS, Take 1 capsule by mouth daily., Disp: 30 capsule, Rfl:    ezetimibe (ZETIA) 10 MG tablet, Take 1 tablet by mouth once daily, Disp: 90 tablet, Rfl: 0   gabapentin (NEURONTIN) 300 MG capsule, Take 1 tablet in the AM and 2 tablets in the evening, Disp: 90 capsule, Rfl: 11   Lancets (ONETOUCH DELICA PLUS LANCET33G) MISC, USE AS DIRECTED TWICE A DAY AS NEEDED, Disp: 200 each, Rfl: 0   lisinopril (ZESTRIL) 10 MG tablet, Take 1 tablet (10 mg total) by mouth daily., Disp: 30 tablet, Rfl: 5   Magnesium Oxide 500 MG CAPS, Take 1 capsule by mouth daily., Disp: , Rfl:    metoprolol tartrate (LOPRESSOR) 25 MG tablet, Take 1 tablet (25 mg total) by mouth 2 (two) times daily., Disp: 180 tablet, Rfl: 3   ONETOUCH ULTRA test strip, USE AS DIRECTED TWICE A DAY AS NEEDED, Disp: 200 strip, Rfl: 1   Potassium Gluconate 595 MG CAPS, Take 595 mg by mouth daily., Disp: , Rfl:    rosuvastatin (CRESTOR) 10 MG tablet, TAKE 1 TABLET BY MOUTH EVERY DAY, Disp: 90 tablet, Rfl: 1   traZODone (DESYREL) 50 MG  tablet, TAKE ONE TABLET BY MOUTH EVERYDAY AT BEDTIME, Disp: 30 tablet, Rfl: 11   VITAMIN E PO, Take 800 Units by mouth daily., Disp: , Rfl:    Continuous Blood Gluc Sensor (FREESTYLE LIBRE 3 SENSOR) MISC, 1 Device by Does not apply route every 14 (fourteen) days. (Patient not taking: Reported on 06/03/2023), Disp: 1 each, Rfl: 0   zinc gluconate 50 MG tablet, Take 50 mg by mouth daily. (Patient not taking: Reported on 06/03/2023), Disp: , Rfl:   No Known Allergies  I personally reviewed active problem list, medication list, allergies, health maintenance, notes from last encounter, lab results with the patient/caregiver today.   ROS  See HPI for relevant ROS  Objective  Vitals:   06/03/23 0841  BP: 130/76  Pulse: 74  Resp: 16  Temp: 98.3 F (36.8 C)  TempSrc: Oral  SpO2: 95%  Weight: 231 lb 12.8 oz (105.1 kg)  Height: 5\' 4"  (1.626 m)    Body mass index is 39.79 kg/m.  Physical Exam Vitals reviewed.  Constitutional:      General: She is awake.     Appearance: Normal appearance. She is well-developed and well-groomed.  HENT:     Head: Normocephalic and atraumatic.  Cardiovascular:     Rate and Rhythm: Normal rate and regular rhythm.     Pulses: Normal pulses.          Radial pulses are 2+ on the right side and 2+ on the left side.       Dorsalis pedis pulses are 2+ on the right side and 2+ on the left side.     Heart sounds: Normal heart sounds. No murmur heard.    No friction rub. No gallop.  Pulmonary:     Effort: Pulmonary effort is normal.     Breath sounds: Normal breath sounds. No decreased air movement. No decreased breath sounds, wheezing, rhonchi or rales.  Musculoskeletal:     Cervical back: Normal range of motion.     Right lower leg: 2+ Edema present.     Left lower leg: 2+ Edema present.  Neurological:     General: No focal deficit present.     Mental Status: She is alert and oriented to person, place, and time. Mental status is at baseline.     GCS:  GCS eye subscore is 4. GCS verbal subscore is 5. GCS motor subscore is 6.  Psychiatric:        Attention and Perception: Attention and perception normal.        Mood and Affect: Mood and affect normal.        Speech: Speech normal.        Behavior: Behavior normal. Behavior is cooperative.        Thought Content: Thought content normal.        Cognition and Memory: Cognition normal.      Recent Results (from the past 2160 hour(s))  GI Profile, Stool, PCR     Status: None   Collection Time: 03/17/23  7:30 AM  Result Value Ref Range   Campylobacter Not Detected Not Detected   C difficile toxin A/B Not Detected Not Detected   Plesiomonas shigelloides Not Detected Not Detected   Salmonella Not Detected Not Detected   Vibrio Not Detected Not Detected   Vibrio cholerae Not Detected Not Detected   Yersinia enterocolitica Not Detected Not Detected   Enteroaggregative E coli Not Detected Not Detected   Enteropathogenic E coli Not Detected Not Detected   Enterotoxigenic E coli Not Detected Not Detected   Shiga-toxin-producing E coli Not Detected Not Detected   E coli O157 Not applicable Not Detected   Shigella/Enteroinvasive E coli Not Detected Not Detected   Cryptosporidium Not Detected Not Detected   Cyclospora cayetanensis Not Detected Not Detected   Entamoeba histolytica Not Detected Not Detected   Giardia lamblia Not Detected Not Detected   Adenovirus F 40/41 Not Detected Not Detected   Astrovirus Not Detected Not Detected   Norovirus GI/GII Not Detected Not Detected   Rotavirus A Not Detected Not Detected   Sapovirus Not Detected Not Detected  Calprotectin, Fecal     Status: None   Collection Time: 03/17/23  7:30 AM  Specimen: Stool  Result Value Ref Range   Calprotectin, Fecal 14 0 - 120 ug/g    Comment: Concentration     Interpretation   Follow-Up < 5 - 50 ug/g     Normal           None >50 -120 ug/g     Borderline       Re-evaluate in 4-6 weeks     >120 ug/g     Abnormal          Repeat as clinically                                    indicated   Surgical pathology     Status: None   Collection Time: 04/16/23 12:00 AM  Result Value Ref Range   SURGICAL PATHOLOGY      SURGICAL PATHOLOGY [facility] [address], [address] [place], [place] [address] Telephone [phone] or [phone] Fax [phone]  REPORT OF SURGICAL PATHOLOGY   Accession #: [id number] Patient Name: STIRLING, RUSTAD Visit # : [id number]  MRN: [id number] Physician: Midge Minium DOB/Age [birth year] (Age: 54) Gender: F Collected Date: 04/16/2023 Received Date: 04/16/2023  FINAL DIAGNOSIS       1. Cecum Polyp, x1 cbx, x1 cold snare :       - TUBULAR ADENOMA (1).      - HYPERPLASTIC POLYP (1).      - NEGATIVE FOR HIGH-GRADE DYSPLASIA AND MALIGNANCY.       2. Ileum, biopsy, cbx :       - SUPERFICIAL FRAGMENTS OF UNREMARKABLE SMALL INTESTINAL MUCOSA.      - NEGATIVE FOR GRANULOMAS, DYSPLASIA, AND MALIGNANCY.       3. Colon, polyp(s), random, cbx :       - UNREMARKABLE COLONIC MUCOSA.      - NEGATIVE FOR MICROSCOPIC COLITIS, DYSPLASIA, AND MALIGNANCY.       ELECTRONIC SIGNATURE : Rubinas Md, Delice Bison , Sports administrator, Electronic Signat ure  MICROSCOPIC DESCRIPTION  CASE COMMENTS STAINS USED IN DIAGNOSIS: H&E H&E H&E    CLINICAL HISTORY  SPECIMEN(S) OBTAINED 1. Cecum Polyp, X1 Cbx, X1 Cold Snare 2. Ileum, biopsy, Cbx 3. Colon, polyp(s), Random, Cbx  SPECIMEN COMMENTS: 2. R/O colitis 3. R/O colitis SPECIMEN CLINICAL INFORMATION: 1. Diverticulosis, colon polyps, hemorrhoids    Gross Description 1. Received in formalin, labeled "cold snare cecal polyp CBX", is a 1.0 x 0.8 x 0.2 cm aggregate of multiple soft, tan tissue fragments submitted entirely in block 1A. 2. Received in formalin, labeled "CBX terminal ileum R/O colitis", is a 0.3 x 0.3 x 0.2 cm soft, tan tissue fragment submitted entirely in block 2A. 3. Received in formalin,  labeled "CBX random colon polyps R/O colitis", is a 0.7 x 0.5 x 0.2 cm aggregate of multiple soft, tan tissue fragments submitted entirely in block 3A.      SMB      04/16/2023        Report signed out from the following location(s) [place]. [place] [facility] [address], [place] [address] CLIA #: [id number]  [facility] [address] [place], [place] [address] CLIA #: [id number]   Microalbumin / creatinine urine ratio     Status: None   Collection Time: 06/03/23  9:34 AM  Result Value Ref Range   Creatinine, Urine 59 20 - 275 mg/dL   Microalb, Ur <  0.2 mg/dL    Comment: Reference Range Not established    Microalb Creat Ratio NOTE <30 mg/g creat    Comment: NOTE: The urine albumin value is less than  0.2 mg/dL therefore we are unable to calculate  excretion and/or creatinine ratio. . The ADA defines abnormalities in albumin excretion as follows: Marland Kitchen Albuminuria Category        Result (mg/g creatinine) . Normal to Mildly increased   <30 Moderately increased         30-299  Severely increased           > OR = 300 . The ADA recommends that at least two of three specimens collected within a 3-6 month period be abnormal before considering a patient to be within a diagnostic category.   HgB A1c     Status: Abnormal   Collection Time: 06/03/23  9:34 AM  Result Value Ref Range   Hgb A1c MFr Bld 6.9 (H) <5.7 % of total Hgb    Comment: For someone without known diabetes, a hemoglobin A1c value of 6.5% or greater indicates that they may have  diabetes and this should be confirmed with a follow-up  test. . For someone with known diabetes, a value <7% indicates  that their diabetes is well controlled and a value  greater than or equal to 7% indicates suboptimal  control. A1c targets should be individualized based on  duration of diabetes, age, comorbid conditions, and  other considerations. . Currently, no consensus exists regarding use  of hemoglobin A1c for diagnosis of diabetes for children. .    Mean Plasma Glucose 151 mg/dL   eAG (mmol/L) 8.4 mmol/L  Lipid Profile     Status: None   Collection Time: 06/03/23  9:34 AM  Result Value Ref Range   Cholesterol 121 <200 mg/dL   HDL 58 > OR = 50 mg/dL   Triglycerides 89 <193 mg/dL   LDL Cholesterol (Calc) 46 mg/dL (calc)    Comment: Reference range: <100 . Desirable range <100 mg/dL for primary prevention;   <70 mg/dL for patients with CHD or diabetic patients  with > or = 2 CHD risk factors. Marland Kitchen LDL-C is now calculated using the Martin-Hopkins  calculation, which is a validated novel method providing  better accuracy than the Friedewald equation in the  estimation of LDL-C.  Horald Pollen et al. Lenox Ahr. 7902;409(73): 2061-2068  (http://education.[facility].com/faq/FAQ164)    Total CHOL/HDL Ratio 2.1 <5.0 (calc)   Non-HDL Cholesterol (Calc) 63 <532 mg/dL (calc)    Comment: For patients with diabetes plus 1 major ASCVD risk  factor, treating to a non-HDL-C goal of <100 mg/dL  (LDL-C of <99 mg/dL) is considered a therapeutic  option.   COMPLETE METABOLIC PANEL WITH GFR     Status: Abnormal   Collection Time: 06/03/23  9:34 AM  Result Value Ref Range   Glucose, Bld 108 (H) 65 - 99 mg/dL    Comment: .            Fasting reference interval . For someone without known diabetes, a glucose value between 100 and 125 mg/dL is consistent with prediabetes and should be confirmed with a follow-up test. .    BUN 12 7 - 25 mg/dL   Creat 2.42 6.83 - 4.19 mg/dL   eGFR 67 > OR = 60 QQ/IWL/7.98X2   BUN/Creatinine Ratio SEE NOTE: 6 - 22 (calc)    Comment:    Not Reported: BUN and Creatinine are within    reference range. Marland Kitchen  Sodium 142 135 - 146 mmol/L   Potassium 4.9 3.5 - 5.3 mmol/L   Chloride 102 98 - 110 mmol/L   CO2 30 20 - 32 mmol/L   Calcium 9.6 8.6 - 10.4 mg/dL   Total Protein 7.1 6.1 - 8.1 g/dL   Albumin 4.2 3.6 - 5.1 g/dL   Globulin 2.9 1.9 - 3.7 g/dL  (calc)   AG Ratio 1.4 1.0 - 2.5 (calc)   Total Bilirubin 0.6 0.2 - 1.2 mg/dL   Alkaline phosphatase (APISO) 54 37 - 153 U/L   AST 16 10 - 35 U/L   ALT 16 6 - 29 U/L  CBC w/Diff/Platelet     Status: Abnormal   Collection Time: 06/03/23  9:34 AM  Result Value Ref Range   WBC 7.6 3.8 - 10.8 Thousand/uL   RBC 5.00 3.80 - 5.10 Million/uL   Hemoglobin 15.3 11.7 - 15.5 g/dL   HCT 40.9 (H) 81.1 - 91.4 %   MCV 91.6 80.0 - 100.0 fL   MCH 30.6 27.0 - 33.0 pg   MCHC 33.4 32.0 - 36.0 g/dL    Comment: For adults, a slight decrease in the calculated MCHC value (in the range of 30 to 32 g/dL) is most likely not clinically significant; however, it should be interpreted with caution in correlation with other red cell parameters and the patient's clinical condition.    RDW 12.7 11.0 - 15.0 %   Platelets 226 140 - 400 Thousand/uL   MPV 10.3 7.5 - 12.5 fL   Neutro Abs 2,987 1,500 - 7,800 cells/uL   Absolute Lymphocytes 3,222 850 - 3,900 cells/uL   Absolute Monocytes 958 (H) 200 - 950 cells/uL   Eosinophils Absolute 350 15 - 500 cells/uL   Basophils Absolute 84 0 - 200 cells/uL   Neutrophils Relative % 39.3 %   Total Lymphocyte 42.4 %   Monocytes Relative 12.6 %   Eosinophils Relative 4.6 %   Basophils Relative 1.1 %     PHQ2/9:    06/03/2023    8:40 AM 12/05/2022   10:39 AM 08/15/2022   11:32 AM 04/15/2022    8:30 AM 03/14/2022   12:48 PM  Depression screen PHQ 2/9  Decreased Interest 0 0 0 0 0  Down, Depressed, Hopeless 0 0 0 0 0  PHQ - 2 Score 0 0 0 0 0  Altered sleeping  0  0 0  Tired, decreased energy  0  0 0  Change in appetite  0  0 0  Feeling bad or failure about yourself   0  0 0  Trouble concentrating  0  0 0  Moving slowly or fidgety/restless  0  0 0  Suicidal thoughts  0  0 0  PHQ-9 Score  0  0 0  Difficult doing work/chores  Not difficult at all  Not difficult at all Not difficult at all      Fall Risk:    06/03/2023    8:34 AM 12/05/2022   10:39 AM 08/15/2022    11:28 AM 04/15/2022    8:30 AM 03/14/2022   12:48 PM  Fall Risk   Falls in the past year? 0 0 0 0 0  Number falls in past yr: 0 0 0 0 0  Injury with Fall? 0 0 0 0 0  Risk for fall due to : No Fall Risks No Fall Risks No Fall Risks No Fall Risks No Fall Risks  Follow up Falls prevention discussed;Education provided;Falls evaluation completed Falls  prevention discussed;Education provided;Falls evaluation completed Education provided;Falls prevention discussed Falls prevention discussed;Education provided Falls prevention discussed;Education provided      Functional Status Survey: Is the patient deaf or have difficulty hearing?: No Does the patient have difficulty seeing, even when wearing glasses/contacts?: No Does the patient have difficulty concentrating, remembering, or making decisions?: No Does the patient have difficulty walking or climbing stairs?: No Does the patient have difficulty dressing or bathing?: No Does the patient have difficulty doing errands alone such as visiting a doctor's office or shopping?: No    Assessment & Plan  Problem List Items Addressed This Visit       Cardiovascular and Mediastinum   Essential hypertension    Chronic, historic condition  Appears well managed  with current regimen comprised of Lisinopril 10 mg PO every day, metoprolol 25 mg PO BID  Continue current regimen Recommend monitoring BP at home several times per week for outside measures Follow up in 6 months or sooner if concerns arise        Relevant Orders   CBC w/Diff/Platelet (Completed)   Chronic diastolic heart failure (HCC) - Primary    Chronic, ongoing Pt is followed regularly by Cardiology - most recent apt was 03/25/23  She reported mild, intermittent ankle swelling at that time- plan was to use compression stockings,and elevate feet, may need to add Lasix for refractory swelling per note Recommend exercising as tolerated and using compression stockings regularly  Continue  regular follow with Cardiology  Follow up in 6 months or sooner if concerns arise           Endocrine   DM (diabetes mellitus), type 2 (HCC)    Chronic, ongoing Most recent A1c was 6.8%  Recheck today Results to dictate further management  She is not currently on medication or checking glucose at home  She reports trying to eat healthy breakfast but she is often on the road for lunch so frequently eats fast food and eats out for dinner  She is trying to walk everyday  Recommend she focuses on healthier eating with emphasis on reducing sugar and carb intake and tries to exercise more  She is interested in GLP1 agonists for DM and weight management-recommend she reaches out to insurance for coverage info prior to starting  Follow up in 3 months or sooner if concerns arise         Relevant Orders   HM DIABETES FOOT EXAM (Completed)   Microalbumin / creatinine urine ratio (Completed)   HgB A1c (Completed)     Genitourinary   Stage 3a chronic kidney disease (HCC)    Chronic, ongoing Recheck CMP today for monitoring Results to dictate further management  On lisinopril for HTN  Will aim for DM control with A1c <7.0 % Follow up in 6 months or sooner if concerns arise        Relevant Orders   COMPLETE METABOLIC PANEL WITH GFR (Completed)     Other   HLD (hyperlipidemia)    Chronic, historic condition Appears well managed on current regimen comprised of Rosuvastatin and Zetia Recheck lipid panel today Continue current regimen for now  Results to dictate further management  Follow up in 6 months or sooner if concerns arise        Relevant Orders   Lipid Profile (Completed)   Class 3 severe obesity with serious comorbidity and body mass index (BMI) of 40.0 to 44.9 in adult [facility])    Chronic, ongoing Patient reports limited improvement with  weight despite dietary efforts and exercise is restricted due to knee pain  She is trying to walk daily - getting 5-6k steps per day  on avg She has several comorbidities- HTN, HLD, DM  Recommend calling insurance to check coverage of Ozempic/ Mounjaro as these may be a good option for weight loss and DM control  Patient to report back to office with insurance coverage determination We did reviewed MOA, side effects, contraindications of GLP1 medications and patient is still interested in trying these.  Follow up as needed for medication initiation        Other Visit Diagnoses     Need for influenza vaccination       Relevant Orders   Flu Vaccine Trivalent High Dose (Fluad) (Completed)        Return in about 3 months (around 09/03/2023) for HLD, DM, HTN.   I, Ashaunte Standley E Karishma Unrein, PA-C, have reviewed all documentation for this visit. The documentation on 06/04/23 for the exam, diagnosis, procedures, and orders are all accurate and complete.   Jacquelin Hawking, MHS, PA-C [facility]

## 2023-06-03 NOTE — Patient Instructions (Addendum)
Please call your insurance company and ask about coverage for Ozempic and Mounjaro for diabetes control . Please ask if there are any prerequisites or requirements to getting these covered.   Please try to wear compression stockings. Use your shoe size and then measure around the widest part of your calf to get the appropriate size stocking I typically recommend 15-25 mmHg compression force for daily wear. Please put these on first thing in the morning and wear until the evening time.  These should not be painful or cut into your legs but they may be pretty snug. If they cause pain, please take them off.  I recommend going to a medical supply store or a website called [employer] to get the compression stockings   It was nice to meet you and I appreciate the opportunity to be involved in your care If you were satisfied with the care you received from me, I would greatly appreciate you saying so in the after-visit survey that is sent out following our visit.

## 2023-06-04 ENCOUNTER — Encounter: Payer: Self-pay | Admitting: Physician Assistant

## 2023-06-04 LAB — LIPID PANEL
Cholesterol: 121 mg/dL (ref <200)
HDL: 58 mg/dL (ref >=50)
LDL Cholesterol (Calc): 46 mg/dL
Non-HDL Cholesterol (Calc): 63 mg/dL (ref <130)
Total CHOL/HDL Ratio: 2.1 (calc) (ref <5.0)
Triglycerides: 89 mg/dL (ref <150)

## 2023-06-04 LAB — CBC WITH DIFFERENTIAL/PLATELET
Absolute Lymphocytes: 3222 {cells}/uL (ref 850–3900)
Absolute Monocytes: 958 {cells}/uL — ABNORMAL HIGH (ref 200–950)
Basophils Absolute: 84 {cells}/uL (ref 0–200)
Basophils Relative: 1.1 %
Eosinophils Absolute: 350 {cells}/uL (ref 15–500)
Eosinophils Relative: 4.6 %
HCT: 45.8 % — ABNORMAL HIGH (ref 35.0–45.0)
Hemoglobin: 15.3 g/dL (ref 11.7–15.5)
MCH: 30.6 pg (ref 27.0–33.0)
MCHC: 33.4 g/dL (ref 32.0–36.0)
MCV: 91.6 fL (ref 80.0–100.0)
MPV: 10.3 fL (ref 7.5–12.5)
Monocytes Relative: 12.6 %
Neutro Abs: 2987 {cells}/uL (ref 1500–7800)
Neutrophils Relative %: 39.3 %
Platelets: 226 10*3/uL (ref 140–400)
RBC: 5 10*6/uL (ref 3.80–5.10)
RDW: 12.7 % (ref 11.0–15.0)
Total Lymphocyte: 42.4 %
WBC: 7.6 10*3/uL (ref 3.8–10.8)

## 2023-06-04 LAB — COMPLETE METABOLIC PANEL WITH GFR
AG Ratio: 1.4 (calc) (ref 1.0–2.5)
ALT: 16 U/L (ref 6–29)
AST: 16 U/L (ref 10–35)
Albumin: 4.2 g/dL (ref 3.6–5.1)
Alkaline phosphatase (APISO): 54 U/L (ref 37–153)
BUN: 12 mg/dL (ref 7–25)
CO2: 30 mmol/L (ref 20–32)
Calcium: 9.6 mg/dL (ref 8.6–10.4)
Chloride: 102 mmol/L (ref 98–110)
Creat: 0.91 mg/dL (ref 0.60–1.00)
Globulin: 2.9 g/dL (ref 1.9–3.7)
Glucose, Bld: 108 mg/dL — ABNORMAL HIGH (ref 65–99)
Potassium: 4.9 mmol/L (ref 3.5–5.3)
Sodium: 142 mmol/L (ref 135–146)
Total Bilirubin: 0.6 mg/dL (ref 0.2–1.2)
Total Protein: 7.1 g/dL (ref 6.1–8.1)
eGFR: 67 mL/min/{1.73_m2} (ref >=60)

## 2023-06-04 LAB — MICROALBUMIN / CREATININE URINE RATIO
Creatinine, Urine: 59 mg/dL (ref 20–275)
Microalb, Ur: <0.2 mg/dL

## 2023-06-04 LAB — HEMOGLOBIN A1C
Hgb A1c MFr Bld: 6.9 %{Hb} — ABNORMAL HIGH (ref <5.7)
Mean Plasma Glucose: 151 mg/dL
eAG (mmol/L): 8.4 mmol/L

## 2023-06-04 NOTE — Assessment & Plan Note (Signed)
Chronic, ongoing Pt is followed regularly by Cardiology - most recent apt was 03/25/23  She reported mild, intermittent ankle swelling at that time- plan was to use compression stockings,and elevate feet, may need to add Lasix for refractory swelling per note Recommend exercising as tolerated and using compression stockings regularly  Continue regular follow with Cardiology  Follow up in 6 months or sooner if concerns arise

## 2023-06-04 NOTE — Assessment & Plan Note (Signed)
Chronic, historic condition Appears well managed on current regimen comprised of Rosuvastatin and Zetia Recheck lipid panel today Continue current regimen for now  Results to dictate further management  Follow up in 6 months or sooner if concerns arise

## 2023-06-04 NOTE — Assessment & Plan Note (Signed)
Chronic, ongoing Recheck CMP today for monitoring Results to dictate further management  On lisinopril for HTN  Will aim for DM control with A1c <7.0 % Follow up in 6 months or sooner if concerns arise

## 2023-06-04 NOTE — Progress Notes (Signed)
Your labs are back Your A1c is at 6.9%.  This is still within goal and further management is not indicated at this time. Your electrolytes, liver and kidney function are overall in normal/stable ranges Your CBC is overall normal, no signs of anemia Your urine microalbumin is overall normal Your cholesterol looks good Please continue your current medication regimen as directed.  Please let us know if you have further questions or concerns

## 2023-06-04 NOTE — Assessment & Plan Note (Addendum)
Chronic, ongoing Most recent A1c was 6.8%  Recheck today Results to dictate further management  She is not currently on medication or checking glucose at home  She reports trying to eat healthy breakfast but she is often on the road for lunch so frequently eats fast food and eats out for dinner  She is trying to walk everyday  Recommend she focuses on healthier eating with emphasis on reducing sugar and carb intake and tries to exercise more  She is interested in GLP1 agonists for DM and weight management-recommend she reaches out to insurance for coverage info prior to starting  Follow up in 3 months or sooner if concerns arise

## 2023-06-04 NOTE — Assessment & Plan Note (Signed)
Chronic, historic condition  Appears well managed  with current regimen comprised of Lisinopril 10 mg PO every day, metoprolol 25 mg PO BID  Continue current regimen Recommend monitoring BP at home several times per week for outside measures Follow up in 6 months or sooner if concerns arise

## 2023-06-04 NOTE — Assessment & Plan Note (Addendum)
Chronic, ongoing Patient reports limited improvement with weight despite dietary efforts and exercise is restricted due to knee pain  She is trying to walk daily - getting 5-6k steps per day on avg She has several comorbidities- HTN, HLD, DM  Recommend calling insurance to check coverage of Ozempic/ Mounjaro as these may be a good option for weight loss and DM control  Patient to report back to office with insurance coverage determination We did reviewed MOA, side effects, contraindications of GLP1 medications and patient is still interested in trying these.  Follow up as needed for medication initiation

## 2023-06-06 ENCOUNTER — Other Ambulatory Visit: Payer: Self-pay | Admitting: Family Medicine

## 2023-06-06 DIAGNOSIS — N1831 Chronic kidney disease, stage 3a: Secondary | ICD-10-CM

## 2023-06-06 DIAGNOSIS — I10 Essential (primary) hypertension: Secondary | ICD-10-CM

## 2023-06-06 DIAGNOSIS — E1142 Type 2 diabetes mellitus with diabetic polyneuropathy: Secondary | ICD-10-CM

## 2023-06-10 ENCOUNTER — Encounter: Payer: Self-pay | Admitting: Physician Assistant

## 2023-06-10 DIAGNOSIS — E1142 Type 2 diabetes mellitus with diabetic polyneuropathy: Secondary | ICD-10-CM

## 2023-06-10 DIAGNOSIS — E66813 Obesity, class 3: Secondary | ICD-10-CM

## 2023-06-10 DIAGNOSIS — I5032 Chronic diastolic (congestive) heart failure: Secondary | ICD-10-CM

## 2023-06-10 MED ORDER — OZEMPIC (0.25 OR 0.5 MG/DOSE) 2 MG/3ML ~~LOC~~ SOPN
0.2500 mg | PEN_INJECTOR | SUBCUTANEOUS | 1 refills | Status: DC
Start: 2023-06-10 — End: 2023-07-31

## 2023-06-25 ENCOUNTER — Encounter: Payer: Self-pay | Admitting: Gastroenterology

## 2023-06-26 ENCOUNTER — Ambulatory Visit (INDEPENDENT_AMBULATORY_CARE_PROVIDER_SITE_OTHER): Payer: Medicare HMO

## 2023-06-26 DIAGNOSIS — E538 Deficiency of other specified B group vitamins: Secondary | ICD-10-CM | POA: Diagnosis not present

## 2023-06-26 MED ORDER — CYANOCOBALAMIN 1000 MCG/ML IJ SOLN
1000.0000 ug | Freq: Once | INTRAMUSCULAR | Status: AC
Start: 2023-06-26 — End: 2023-06-26
  Administered 2023-06-26: 1000 ug via INTRAMUSCULAR

## 2023-06-26 NOTE — Progress Notes (Signed)
Patient presented to clinic in expressed good health for B-12 injection. Patient tolerated injection well, was pleasant and had no questions, concerns or adverse reactions noted at time of clinic departure.

## 2023-07-28 ENCOUNTER — Ambulatory Visit (INDEPENDENT_AMBULATORY_CARE_PROVIDER_SITE_OTHER): Payer: Medicare HMO

## 2023-07-28 DIAGNOSIS — E538 Deficiency of other specified B group vitamins: Secondary | ICD-10-CM | POA: Diagnosis not present

## 2023-07-28 MED ORDER — CYANOCOBALAMIN 1000 MCG/ML IJ SOLN
1000.0000 ug | Freq: Once | INTRAMUSCULAR | Status: AC
Start: 2023-07-28 — End: 2023-07-28
  Administered 2023-07-28: 1000 ug via INTRAMUSCULAR

## 2023-07-28 NOTE — Progress Notes (Signed)
Patient is in office today for a nurse visit for B12 Injection. Patient Injection was given in the  Left deltoid. Patient tolerated injection well.

## 2023-07-31 ENCOUNTER — Other Ambulatory Visit: Payer: Self-pay | Admitting: Physician Assistant

## 2023-07-31 DIAGNOSIS — E1142 Type 2 diabetes mellitus with diabetic polyneuropathy: Secondary | ICD-10-CM

## 2023-07-31 DIAGNOSIS — I5032 Chronic diastolic (congestive) heart failure: Secondary | ICD-10-CM

## 2023-07-31 NOTE — Telephone Encounter (Signed)
Requested Prescriptions  Pending Prescriptions Disp Refills   Semaglutide,0.25 or 0.5MG /DOS, (OZEMPIC, 0.25 OR 0.5 MG/DOSE,) 2 MG/3ML SOPN [Pharmacy Med Name: OZEMPIC 0.25-0.5 MG/DOSE PEN] 3 mL 0    Sig: INJECT 0.25MG  INTO THE SKIN ONE TIME PER WEEK     Endocrinology:  Diabetes - GLP-1 Receptor Agonists - semaglutide Failed - 07/31/2023 10:03 AM      Failed - HBA1C in normal range and within 180 days    Hgb A1c MFr Bld  Date Value Ref Range Status  06/03/2023 6.9 (H) <5.7 % of total Hgb Final    Comment:    For someone without known diabetes, a hemoglobin A1c value of 6.5% or greater indicates that they may have  diabetes and this should be confirmed with a follow-up  test. . For someone with known diabetes, a value <7% indicates  that their diabetes is well controlled and a value  greater than or equal to 7% indicates suboptimal  control. A1c targets should be individualized based on  duration of diabetes, age, comorbid conditions, and  other considerations. . Currently, no consensus exists regarding use of hemoglobin A1c for diagnosis of diabetes for children. .          Passed - Cr in normal range and within 360 days    Creat  Date Value Ref Range Status  06/03/2023 0.91 0.60 - 1.00 mg/dL Final   Creatinine, Urine  Date Value Ref Range Status  06/03/2023 59 20 - 275 mg/dL Final         Passed - Valid encounter within last 6 months    Recent Outpatient Visits           1 month ago Chronic diastolic heart failure (HCC)   [facility]um, Oswaldo Conroy, PA-C   7 months ago Essential hypertension   [facility] Danelle Berry, PA-C   1 year ago Essential hypertension   [facility] Danelle Berry, PA-C   1 year ago Type 2 diabetes mellitus with diabetic polyneuropathy, without long-term current use of insulin [facility])    [facility] Danelle Berry, PA-C   1 year ago Myalgia    [facility] Danelle Berry, PA-C       Future Appointments             In 1 month Zane Herald, Rudolpho Sevin, FNP [facility]   In 3 months Willeen Niece, MD [facility]

## 2023-08-08 ENCOUNTER — Other Ambulatory Visit: Payer: Self-pay | Admitting: Cardiovascular Disease

## 2023-08-08 DIAGNOSIS — E785 Hyperlipidemia, unspecified: Secondary | ICD-10-CM

## 2023-08-08 DIAGNOSIS — I700 Atherosclerosis of aorta: Secondary | ICD-10-CM

## 2023-08-11 ENCOUNTER — Other Ambulatory Visit: Payer: Self-pay | Admitting: Family Medicine

## 2023-08-11 DIAGNOSIS — E785 Hyperlipidemia, unspecified: Secondary | ICD-10-CM

## 2023-08-11 DIAGNOSIS — I700 Atherosclerosis of aorta: Secondary | ICD-10-CM

## 2023-08-13 NOTE — Telephone Encounter (Signed)
 Refused Crestor  10 mg because it's being requested too soo.

## 2023-08-21 ENCOUNTER — Ambulatory Visit: Payer: Medicare HMO

## 2023-08-21 DIAGNOSIS — Z0000 Encounter for general adult medical examination without abnormal findings: Secondary | ICD-10-CM | POA: Diagnosis not present

## 2023-08-21 NOTE — Patient Instructions (Signed)
Jacqueline [name] , Thank you for taking time to come for your Medicare Wellness Visit. I appreciate your ongoing commitment to your health goals. Please review the following plan we discussed and let me know if I can assist you in the future.   Referrals/Orders/Follow-Ups/Clinician Recommendations: NONE  This is a list of the screening recommended for you and due dates:  Health Maintenance  Topic Date Due   DTaP/Tdap/Td vaccine (4 - Tdap) 11/28/2019   COVID-19 Vaccine (3 - Pfizer risk series) 05/11/2020   Zoster (Shingles) Vaccine (1 of 2) 09/03/2023*   Hemoglobin A1C  12/01/2023   Eye exam for diabetics  12/17/2023   Mammogram  03/30/2024   Yearly kidney function blood test for diabetes  06/02/2024   Yearly kidney health urinalysis for diabetes  06/02/2024   Complete foot exam   06/02/2024   Medicare Annual Wellness Visit  08/20/2024   DEXA scan (bone density measurement)  03/30/2025   Colon Cancer Screening  04/15/2030   Pneumonia Vaccine  Completed   Flu Shot  Completed   Hepatitis C Screening  Completed   HPV Vaccine  Aged Out  *Topic was postponed. The date shown is not the original due date.    Advanced directives: (ACP Link)Information on Advanced Care Planning can be found at [facility] [place] Advance Health Care Directives Advance Health Care Directives ([email or web address]   Next Medicare Annual Wellness Visit scheduled for next year: Yes    08/26/24 @ 8:50 AM BY VIDEO

## 2023-08-21 NOTE — Progress Notes (Signed)
Subjective:   Jacqueline [name] is a 73 y.o. female who presents for Medicare Annual (Subsequent) preventive examination.  Visit Complete: Virtual I connected with  Jacqueline [facility] on 08/21/23 by a video and audio enabled telemedicine application and verified that I am speaking with the correct person using two identifiers.  Patient Location: Home  Provider Location: Office/Clinic  I discussed the limitations of evaluation and management by telemedicine. The patient expressed understanding and agreed to proceed.  Vital Signs: Because this visit was a virtual/telehealth visit, some criteria may be missing or patient reported. Any vitals not documented were not able to be obtained and vitals that have been documented are patient reported.  Cardiac Risk Factors include: advanced age (>48men, >41 women);diabetes mellitus;dyslipidemia;hypertension;obesity (BMI >30kg/m2)     Objective:    There were no vitals filed for this visit. There is no height or weight on file to calculate BMI.     08/21/2023    8:40 AM 04/16/2023    6:53 AM 08/15/2022   11:45 AM 09/03/2021    1:46 PM 08/14/2021   11:14 AM 08/03/2020   11:04 AM 07/30/2019   10:20 AM  Advanced Directives  Does Patient Have a Medical Advance Directive? No Yes Yes Yes Yes No No  Type of Special educational needs teacher of [place];Living will Healthcare Power of [place];Living will Healthcare Power of [place];Living will Healthcare Power of [place];Living will    Does patient want to make changes to medical advance directive?    No - Patient declined     Copy of Healthcare Power of Attorney in Chart?  No - copy requested No - copy requested  No - copy requested    Would patient like information on creating a medical advance directive? No - Patient declined     Yes (MAU/Ambulatory/Procedural Areas - Information given) Yes (MAU/Ambulatory/Procedural Areas - Information given)    Current Medications (verified) Outpatient  Encounter Medications as of 08/21/2023  Medication Sig   aspirin 81 MG chewable tablet Chew 81 mg by mouth daily.   blood glucose meter kit and supplies KIT Lifescan meter Check blood sugar 2 times daily as needed   calcium carbonate (OS-CAL) 600 MG TABS tablet Take 600 mg by mouth 2 (two) times daily with a meal.   Cholecalciferol (VITAMIN D) 50 MCG (2000 UT) CAPS Take 1 capsule by mouth daily.   Continuous Blood Gluc Sensor (FREESTYLE LIBRE 3 SENSOR) MISC 1 Device by Does not apply route every 14 (fourteen) days.   ezetimibe (ZETIA) 10 MG tablet Take 1 tablet by mouth once daily   gabapentin (NEURONTIN) 300 MG capsule Take 1 tablet in the AM and 2 tablets in the evening   Lancets (ONETOUCH DELICA PLUS LANCET33G) MISC USE AS DIRECTED TWICE A DAY AS NEEDED   lisinopril (ZESTRIL) 10 MG tablet TAKE 1 TABLET BY MOUTH ONCE DAILY   Magnesium Oxide 500 MG CAPS Take 1 capsule by mouth daily.   metoprolol tartrate (LOPRESSOR) 25 MG tablet Take 1 tablet (25 mg total) by mouth 2 (two) times daily.   ONETOUCH ULTRA test strip USE AS DIRECTED TWICE A DAY AS NEEDED   Potassium Gluconate 595 MG CAPS Take 595 mg by mouth daily.   rosuvastatin (CRESTOR) 10 MG tablet TAKE 1 TABLET BY MOUTH EVERY DAY   traZODone (DESYREL) 50 MG tablet TAKE ONE TABLET BY MOUTH EVERYDAY AT BEDTIME   VITAMIN E PO Take 800 Units by mouth daily.   Semaglutide,0.25 or 0.5MG /DOS, (OZEMPIC, 0.25  OR 0.5 MG/DOSE,) 2 MG/3ML SOPN INJECT 0.25MG  INTO THE SKIN ONE TIME PER WEEK (Patient not taking: Reported on 08/21/2023)   zinc gluconate 50 MG tablet Take 50 mg by mouth daily. (Patient not taking: Reported on 06/03/2023)   No facility-administered encounter medications on file as of 08/21/2023.    Allergies (verified) Patient has no known allergies.   History: Past Medical History:  Diagnosis Date   Calcium oxalate renal stones    Hyperlipidemia    Hypertension    MVP (mitral valve prolapse)    Neuropathy 11/23/2019   Pre-diabetes     Past Surgical History:  Procedure Laterality Date   BIOPSY  04/16/2023   Procedure: BIOPSY;  Surgeon: Midge Minium, MD;  Location: [facility] ENDOSCOPY;  Service: Endoscopy;;   BREAST BIOPSY Right 08/19/2012   neg, Jacqueline [name]   BREAST BIOPSY Left 04/30/2022   [place] bx, ribbon marker,RUPTURED DUCT ECTASIA, FLORID DUCTAL HYPERPLASIA, COLUMNAR CELL CHANGE, AND APOCRINE METAPLASIA   COLONOSCOPY WITH PROPOFOL N/A 04/16/2023   Procedure: COLONOSCOPY WITH PROPOFOL;  Surgeon: Midge Minium, MD;  Location: [facility] ENDOSCOPY;  Service: Endoscopy;  Laterality: N/A;   extraction of renal stones     POLYPECTOMY  04/16/2023   Procedure: POLYPECTOMY;  Surgeon: Midge Minium, MD;  Location: [facility] ENDOSCOPY;  Service: Endoscopy;;   TONSILLECTOMY AND ADENOIDECTOMY     VAGINAL HYSTERECTOMY  1980   class IV, ovaries intact   Family History  Problem Relation Age of Onset   Hypertension Mother    Dementia Mother    Stroke Mother    Cancer - Other Father        Bile Duct   Heart disease Maternal Grandmother    Heart disease Maternal Grandfather    Hypertension Paternal Grandmother    Cancer Maternal Aunt        breast and lung   Stroke Paternal Grandfather    Social History   Socioeconomic History   Marital status: Married    Spouse name: Jacqueline [name]   Number of children: 2   Years of education: Not on file   Highest education level: Some college, no degree  Occupational History   Occupation: Museum/gallery exhibitions officer  Tobacco Use   Smoking status: Former    Current packs/day: 0.00    Average packs/day: 0.3 packs/day for 30.9 years (7.7 ttl pk-yrs)    Types: Cigarettes    Start date: 07/09/1983    Quit date: 05/27/2014    Years since quitting: 9.2   Smokeless tobacco: Never  Vaping Use   Vaping status: Never Used  Substance and Sexual Activity   Alcohol use: Yes    Alcohol/week: 4.0 standard drinks of alcohol    Types: 4 Glasses of wine per week    Comment: occasional   Drug use: No   Sexual activity: Yes     Partners: Male  Other Topics Concern   Not on file  Social History Narrative   Not on file   Social Drivers of Health   Financial Resource Strain: Low Risk  (08/21/2023)   Overall Financial Resource Strain (CARDIA)    Difficulty of Paying Living Expenses: Not hard at all  Food Insecurity: No Food Insecurity (08/21/2023)   Hunger Vital Sign    Worried About Running Out of Food in the Last Year: Never true    Ran Out of Food in the Last Year: Never true  Transportation Needs: No Transportation Needs (08/21/2023)   PRAPARE - Administrator, Civil Service (Medical): No  Lack of Transportation (Non-Medical): No  Physical Activity: Insufficiently Active (08/21/2023)   Exercise Vital Sign    Days of Exercise per Week: 4 days    Minutes of Exercise per Session: 30 min  Stress: No Stress Concern Present (08/21/2023)   [employer] of Occupational Health - Occupational Stress Questionnaire    Feeling of Stress : Not at all  Social Connections: Moderately Integrated (08/21/2023)   Social Connection and Isolation Panel [NHANES]    Frequency of Communication with Friends and Family: More than three times a week    Frequency of Social Gatherings with Friends and Family: Three times a week    Attends Religious Services: More than 4 times per year    Active Member of Clubs or Organizations: No    Attends Banker Meetings: Never    Marital Status: Married    Tobacco Counseling Counseling given: Not Answered   Clinical Intake:  Pre-visit preparation completed: Yes  Pain : No/denies pain     BMI - recorded: 39.7 Nutritional Status: BMI > 30  Obese Nutritional Risks: None Diabetes: Yes CBG done?: No Did pt. bring in CBG monitor from home?: No  How often do you need to have someone help you when you read instructions, pamphlets, or other written materials from your doctor or pharmacy?: 1 - Never  Interpreter Needed?: No  Information entered by ::  Kennedy Bucker, LPN   Activities of Daily Living    08/21/2023    8:41 AM 08/20/2023   12:55 PM  In your present state of health, do you have any difficulty performing the following activities:  Hearing? 0 0  Vision? 0 0  Difficulty concentrating or making decisions? 0 0  Walking or climbing stairs? 0 0  Dressing or bathing? 0 0  Doing errands, shopping? 0 0  Preparing Food and eating ? N N  Using the Toilet? N N  In the past six months, have you accidently leaked urine? Y Y  Do you have problems with loss of bowel control? Y Y  Managing your Medications? N N  Managing your Finances? N N  Housekeeping or managing your Housekeeping? N N    Patient Care Team: Danelle Berry, PA-C as PCP - General (Family Medicine) Antonieta Iba, MD as Consulting Physician (Cardiology) Pa, [facility] (Optometry)  Indicate any recent Medical Services you may have received from other than [facility] providers in the past year (date may be approximate).     Assessment:   This is a routine wellness examination for Jacqueline [name].  Hearing/Vision screen Hearing Screening - Comments:: NO AIDS Vision Screening - Comments:: WEARS CONTACTS- [facility]   Goals Addressed             This Visit's Progress    DIET - EAT MORE FRUITS AND VEGETABLES         Depression Screen    08/21/2023    8:39 AM 06/03/2023    8:40 AM 12/05/2022   10:39 AM 08/15/2022   11:32 AM 04/15/2022    8:30 AM 03/14/2022   12:48 PM 01/09/2022    1:25 PM  PHQ 2/9 Scores  PHQ - 2 Score 0 0 0 0 0 0 0  PHQ- 9 Score 0  0  0 0 0    Fall Risk    08/21/2023    8:41 AM 08/20/2023   12:55 PM 06/03/2023    8:34 AM 12/05/2022   10:39 AM 08/15/2022   11:28 AM  Fall  Risk   Falls in the past year? 0 0 0 0 0  Number falls in past yr: 0  0 0 0  Injury with Fall? 0  0 0 0  Risk for fall due to : No Fall Risks  No Fall Risks No Fall Risks No Fall Risks  Follow up Falls prevention discussed;Falls evaluation completed  Falls prevention  discussed;Education provided;Falls evaluation completed Falls prevention discussed;Education provided;Falls evaluation completed Education provided;Falls prevention discussed    MEDICARE RISK AT HOME: Medicare Risk at Home Any stairs in or around the home?: Yes If so, are there any without handrails?: Yes Home free of loose throw rugs in walkways, pet beds, electrical cords, etc?: Yes Adequate lighting in your home to reduce risk of falls?: Yes Life alert?: No Use of a cane, walker or w/c?: No Grab bars in the bathroom?: Yes Shower chair or bench in shower?: Yes Elevated toilet seat or a handicapped toilet?: Yes  TIMED UP AND GO:  Was the test performed?  No    Cognitive Function:        08/21/2023    8:42 AM 08/15/2022   11:49 AM 07/30/2019   10:25 AM  6CIT Screen  What Year? 0 points 0 points 0 points  What month? 0 points 0 points 0 points  What time? 0 points 0 points 0 points  Count back from 20 0 points 0 points 0 points  Months in reverse 0 points 0 points 0 points  Repeat phrase 0 points 0 points 0 points  Total Score 0 points 0 points 0 points    Immunizations Immunization History  Administered Date(s) Administered   Fluad Quad(high Dose 65+) 07/03/2020, 04/15/2022   Fluad Trivalent(High Dose 65+) 06/03/2023   Influenza,inj,Quad PF,6+ Mos 07/30/2018   Influenza-Unspecified 07/30/2018   PFIZER(Purple Top)SARS-COV-2 Vaccination 03/23/2020, 04/13/2020   PNEUMOCOCCAL CONJUGATE-20 10/08/2021   Pneumococcal Polysaccharide-23 07/03/2020   Td 12/27/1997, 11/27/2009   Td (Adult), 2 Lf Tetanus Toxid, Preservative Free 12/27/1997, 11/27/2009   Zoster, Live 10/27/2013    TDAP status: Due, Education has been provided regarding the importance of this vaccine. Advised may receive this vaccine at local pharmacy or Health Dept. Aware to provide a copy of the vaccination record if obtained from local pharmacy or Health Dept. Verbalized acceptance and understanding.  Flu  Vaccine status: Up to date  Pneumococcal vaccine status: Up to date  Covid-19 vaccine status: Completed vaccines  Qualifies for Shingles Vaccine? Yes   Zostavax completed Yes   Shingrix Completed?: No.    Education has been provided regarding the importance of this vaccine. Patient has been advised to call insurance company to determine out of pocket expense if they have not yet received this vaccine. Advised may also receive vaccine at local pharmacy or Health Dept. Verbalized acceptance and understanding.  Screening Tests Health Maintenance  Topic Date Due   DTaP/Tdap/Td (4 - Tdap) 11/28/2019   COVID-19 Vaccine (3 - Pfizer risk series) 05/11/2020   Zoster Vaccines- Shingrix (1 of 2) 09/03/2023 (Originally 07/28/1969)   HEMOGLOBIN A1C  12/01/2023   OPHTHALMOLOGY EXAM  12/17/2023   MAMMOGRAM  03/30/2024   Diabetic kidney evaluation - eGFR measurement  06/02/2024   Diabetic kidney evaluation - Urine ACR  06/02/2024   FOOT EXAM  06/02/2024   Medicare Annual Wellness (AWV)  08/20/2024   DEXA SCAN  03/30/2025   Colonoscopy  04/15/2030   Pneumonia Vaccine 63+ Years old  Completed   INFLUENZA VACCINE  Completed   Hepatitis C Screening  Completed   HPV VACCINES  Aged Out    Health Maintenance  Health Maintenance Due  Topic Date Due   DTaP/Tdap/Td (4 - Tdap) 11/28/2019   COVID-19 Vaccine (3 - Pfizer risk series) 05/11/2020    Colorectal cancer screening: Type of screening: Colonoscopy. Completed 04/16/23. Repeat every 5 years  Mammogram status: Completed 03/31/23. Repeat every year  Bone Density status: Completed 03/31/23. Results reflect: Bone density results: OSTEOPENIA. Repeat every 5 years.  Lung Cancer Screening: (Low Dose CT Chest recommended if Age 17-80 years, 20 pack-year currently smoking OR have quit w/in 15years.) does not qualify.    Additional Screening:  Hepatitis C Screening: does qualify; Completed 04/05/19  Vision Screening: Recommended annual ophthalmology  exams for early detection of glaucoma and other disorders of the eye. Is the patient up to date with their annual eye exam?  Yes  Who is the provider or what is the name of the office in which the patient attends annual eye exams? [facility] If pt is not established with a provider, would they like to be referred to a provider to establish care? No .   Dental Screening: Recommended annual dental exams for proper oral hygiene  Diabetic Foot Exam: Diabetic Foot Exam: Completed 06/03/23  Community Resource Referral / Chronic Care Management: CRR required this visit?  No   CCM required this visit?  No     Plan:     I have personally reviewed and noted the following in the patient's chart:   Medical and social history Use of alcohol, tobacco or illicit drugs  Current medications and supplements including opioid prescriptions. Patient is not currently taking opioid prescriptions. Functional ability and status Nutritional status Physical activity Advanced directives List of other physicians Hospitalizations, surgeries, and ER visits in previous 12 months Vitals Screenings to include cognitive, depression, and falls Referrals and appointments  In addition, I have reviewed and discussed with patient certain preventive protocols, quality metrics, and best practice recommendations. A written personalized care plan for preventive services as well as general preventive health recommendations were provided to patient.     Hal Hope, LPN   1/61/0960   After Visit Summary: (MyChart) Due to this being a telephonic visit, the after visit summary with patients personalized plan was offered to patient via MyChart   Nurse Notes: NONE

## 2023-08-25 ENCOUNTER — Ambulatory Visit (INDEPENDENT_AMBULATORY_CARE_PROVIDER_SITE_OTHER): Payer: Medicare HMO

## 2023-08-25 DIAGNOSIS — E538 Deficiency of other specified B group vitamins: Secondary | ICD-10-CM | POA: Diagnosis not present

## 2023-08-25 MED ORDER — CYANOCOBALAMIN 1000 MCG/ML IJ SOLN
1000.0000 ug | Freq: Once | INTRAMUSCULAR | Status: AC
Start: 2023-08-25 — End: 2023-08-25
  Administered 2023-08-25: 1000 ug via INTRAMUSCULAR

## 2023-08-25 NOTE — Progress Notes (Signed)
 Patient is in office today for a nurse visit for B12 Injection. Patient Injection was given in the  Left deltoid. Patient tolerated injection well.

## 2023-09-03 ENCOUNTER — Ambulatory Visit (INDEPENDENT_AMBULATORY_CARE_PROVIDER_SITE_OTHER): Payer: Medicare HMO | Admitting: Family Medicine

## 2023-09-03 ENCOUNTER — Encounter: Payer: Self-pay | Admitting: Family Medicine

## 2023-09-03 ENCOUNTER — Ambulatory Visit: Payer: Medicare HMO | Admitting: Physician Assistant

## 2023-09-03 VITALS — BP 130/76 | HR 86 | Resp 16 | Ht 64.0 in | Wt 239.0 lb

## 2023-09-03 DIAGNOSIS — I10 Essential (primary) hypertension: Secondary | ICD-10-CM

## 2023-09-03 DIAGNOSIS — Z7985 Long-term (current) use of injectable non-insulin antidiabetic drugs: Secondary | ICD-10-CM

## 2023-09-03 DIAGNOSIS — E785 Hyperlipidemia, unspecified: Secondary | ICD-10-CM

## 2023-09-03 DIAGNOSIS — Z6841 Body Mass Index (BMI) 40.0 and over, adult: Secondary | ICD-10-CM

## 2023-09-03 DIAGNOSIS — I479 Paroxysmal tachycardia, unspecified: Secondary | ICD-10-CM

## 2023-09-03 DIAGNOSIS — E1142 Type 2 diabetes mellitus with diabetic polyneuropathy: Secondary | ICD-10-CM | POA: Diagnosis not present

## 2023-09-03 DIAGNOSIS — I5032 Chronic diastolic (congestive) heart failure: Secondary | ICD-10-CM

## 2023-09-03 DIAGNOSIS — N1831 Chronic kidney disease, stage 3a: Secondary | ICD-10-CM

## 2023-09-03 DIAGNOSIS — I700 Atherosclerosis of aorta: Secondary | ICD-10-CM

## 2023-09-03 MED ORDER — OZEMPIC (0.25 OR 0.5 MG/DOSE) 2 MG/3ML ~~LOC~~ SOPN: PEN_INJECTOR | SUBCUTANEOUS | 0 refills | Status: AC

## 2023-09-03 MED ORDER — ROSUVASTATIN CALCIUM 10 MG PO TABS: 10.0000 mg | ORAL_TABLET | Freq: Every day | ORAL | 1 refills | Status: DC

## 2023-09-03 MED ORDER — LISINOPRIL 10 MG PO TABS: 10.0000 mg | ORAL_TABLET | Freq: Every day | ORAL | 1 refills | Status: DC

## 2023-09-03 NOTE — Progress Notes (Signed)
 Name: Jacqueline [name] [facility]   MRN: [id number]    DOB: 10-10-1950   Date:09/03/2023       Progress Note  Chief Complaint  Patient presents with   Medical Management of Chronic Issues   Diabetes   Hypertension     Subjective:   Jacqueline [name] is a 73 y.o. female, presents to clinic for routine follow up on chronic conditions  T2DM -onset in 2020 Recent labs had increased and float provider prescribed Ozempic Patient tried the first pen with 0.25 mg dose weekly for 8 doses and has been out of the medication for about the past month She tolerated it without any concerning side effects, she is not checking her sugars Lab Results  Component Value Date   HGBA1C 6.9 (H) 06/03/2023   HGBA1C 6.8 (H) 12/05/2022   HGBA1C 6.7 (H) 04/15/2022   HGBA1C 6.5 (H) 10/08/2021   HGBA1C 6.1 (H) 03/02/2021   HGBA1C 6.2 (H) 07/03/2020    Obesity weight increased over the past 6 months Wt Readings from Last 5 Encounters:  09/03/23 239 lb (108.4 kg)  06/03/23 231 lb 12.8 oz (105.1 kg)  04/16/23 231 lb 9.6 oz (105.1 kg)  03/25/23 233 lb 2 oz (105.7 kg)  02/11/23 231 lb 9.6 oz (105.1 kg)   BMI Readings from Last 5 Encounters:  09/03/23 41.02 kg/m  06/03/23 39.79 kg/m  04/16/23 39.75 kg/m  03/25/23 40.02 kg/m  02/11/23 39.75 kg/m       Current Outpatient Medications:    aspirin 81 MG chewable tablet, Chew 81 mg by mouth daily., Disp: , Rfl:    blood glucose meter kit and supplies KIT, Lifescan meter Check blood sugar 2 times daily as needed, Disp: 1 each, Rfl: 0   calcium carbonate (OS-CAL) 600 MG TABS tablet, Take 600 mg by mouth 2 (two) times daily with a meal., Disp: 30 tablet, Rfl: 0   Cholecalciferol (VITAMIN D) 50 MCG (2000 UT) CAPS, Take 1 capsule by mouth daily., Disp: 30 capsule, Rfl:    Continuous Blood Gluc Sensor (FREESTYLE LIBRE 3 SENSOR) MISC, 1 Device by Does not apply route every 14 (fourteen) days., Disp: 1 each, Rfl: 0   ezetimibe (ZETIA) 10 MG tablet, Take 1  tablet by mouth once daily, Disp: 90 tablet, Rfl: 3   gabapentin (NEURONTIN) 300 MG capsule, Take 1 tablet in the AM and 2 tablets in the evening, Disp: 90 capsule, Rfl: 11   Lancets (ONETOUCH DELICA PLUS LANCET33G) MISC, USE AS DIRECTED TWICE A DAY AS NEEDED, Disp: 200 each, Rfl: 0   lisinopril (ZESTRIL) 10 MG tablet, TAKE 1 TABLET BY MOUTH ONCE DAILY, Disp: 90 tablet, Rfl: 0   Magnesium Oxide 500 MG CAPS, Take 1 capsule by mouth daily., Disp: , Rfl:    metoprolol tartrate (LOPRESSOR) 25 MG tablet, Take 1 tablet (25 mg total) by mouth 2 (two) times daily., Disp: 180 tablet, Rfl: 3   ONETOUCH ULTRA test strip, USE AS DIRECTED TWICE A DAY AS NEEDED, Disp: 200 strip, Rfl: 1   Potassium Gluconate 595 MG CAPS, Take 595 mg by mouth daily., Disp: , Rfl:    rosuvastatin (CRESTOR) 10 MG tablet, TAKE 1 TABLET BY MOUTH EVERY DAY, Disp: 90 tablet, Rfl: 1   traZODone (DESYREL) 50 MG tablet, TAKE ONE TABLET BY MOUTH EVERYDAY AT BEDTIME, Disp: 30 tablet, Rfl: 11   VITAMIN E PO, Take 800 Units by mouth daily., Disp: , Rfl:    Semaglutide,0.25 or 0.5MG /DOS, (OZEMPIC, 0.25 OR 0.5 MG/DOSE,) 2  MG/3ML SOPN, INJECT 0.25MG  INTO THE SKIN ONE TIME PER WEEK (Patient not taking: Reported on 09/03/2023), Disp: 3 mL, Rfl: 0  Patient Active Problem List   Diagnosis Date Noted   Diarrhea 04/16/2023   Polyp of colon 04/16/2023   Aortic atherosclerosis (HCC) 12/05/2022   Chronic diastolic heart failure (HCC) 10/12/2021   Paroxysmal tachycardia (HCC) 10/12/2021   Vitamin D deficiency 10/12/2021   DM (diabetes mellitus), type 2 (HCC) 10/12/2021   Peripheral edema 12/28/2019   Neuropathy 11/23/2019   B12 deficiency 09/23/2019   Stage 3a chronic kidney disease (HCC) 09/23/2019   History of kidney stones 10/14/2018   Spinal stenosis of lumbar region 08/11/2009   Essential hypertension 01/14/2008   Class 3 severe obesity with serious comorbidity and body mass index (BMI) of 40.0 to 44.9 in adult (HCC) 12/25/2006   HLD  (hyperlipidemia) 11/11/2006   Osteopenia 11/11/2006    Past Surgical History:  Procedure Laterality Date   BIOPSY  04/16/2023   Procedure: BIOPSY;  Surgeon: Midge Minium, MD;  Location: [facility] ENDOSCOPY;  Service: Endoscopy;;   BREAST BIOPSY Right 08/19/2012   neg, Dr. Lemar Livings   BREAST BIOPSY Left 04/30/2022   [place] bx, ribbon marker,RUPTURED DUCT ECTASIA, FLORID DUCTAL HYPERPLASIA, COLUMNAR CELL CHANGE, AND APOCRINE METAPLASIA   COLONOSCOPY WITH PROPOFOL N/A 04/16/2023   Procedure: COLONOSCOPY WITH PROPOFOL;  Surgeon: Midge Minium, MD;  Location: [facility] ENDOSCOPY;  Service: Endoscopy;  Laterality: N/A;   extraction of renal stones     POLYPECTOMY  04/16/2023   Procedure: POLYPECTOMY;  Surgeon: Midge Minium, MD;  Location: [facility] ENDOSCOPY;  Service: Endoscopy;;   TONSILLECTOMY AND ADENOIDECTOMY     VAGINAL HYSTERECTOMY  1980   class IV, ovaries intact    Family History  Problem Relation Age of Onset   Hypertension Mother    Dementia Mother    Stroke Mother    Cancer - Other Father        Bile Duct   Heart disease Maternal Grandmother    Heart disease Maternal Grandfather    Hypertension Paternal Grandmother    Cancer Maternal Aunt        breast and lung   Stroke Paternal Grandfather     Social History   Tobacco Use   Smoking status: Former    Current packs/day: 0.00    Average packs/day: 0.3 packs/day for 30.9 years (7.7 ttl pk-yrs)    Types: Cigarettes    Start date: 07/09/1983    Quit date: 05/27/2014    Years since quitting: 9.2   Smokeless tobacco: Never  Vaping Use   Vaping status: Never Used  Substance Use Topics   Alcohol use: Yes    Alcohol/week: 4.0 standard drinks of alcohol    Types: 4 Glasses of wine per week    Comment: occasional   Drug use: No     No Known Allergies  Health Maintenance  Topic Date Due   Zoster Vaccines- Shingrix (1 of 2) 09/03/2023 (Originally 07/28/1970)   COVID-19 Vaccine (3 - Pfizer risk series) 09/18/2023 (Originally 05/11/2020)    DTaP/Tdap/Td (4 - Tdap) 09/01/2024 (Originally 11/28/2019)   HEMOGLOBIN A1C  12/01/2023   OPHTHALMOLOGY EXAM  12/17/2023   MAMMOGRAM  03/30/2024   Diabetic kidney evaluation - eGFR measurement  06/02/2024   Diabetic kidney evaluation - Urine ACR  06/02/2024   FOOT EXAM  06/02/2024   Medicare Annual Wellness (AWV)  08/20/2024   DEXA SCAN  03/30/2025   Colonoscopy  04/15/2030   Pneumonia Vaccine 23+ Years old  Completed   INFLUENZA VACCINE  Completed   Hepatitis C Screening  Completed   HPV VACCINES  Aged Out    Chart Review Today: I personally reviewed active problem list, medication list, allergies, family history, social history, health maintenance, notes from last encounter, lab results, imaging with the patient/caregiver today.   Review of Systems  Constitutional: Negative.   HENT: Negative.    Eyes: Negative.   Respiratory: Negative.    Cardiovascular: Negative.   Gastrointestinal: Negative.   Endocrine: Negative.   Genitourinary: Negative.   Musculoskeletal: Negative.   Skin: Negative.   Allergic/Immunologic: Negative.   Neurological: Negative.   Hematological: Negative.   Psychiatric/Behavioral: Negative.    All other systems reviewed and are negative.    Objective:   Vitals:   09/03/23 0926  BP: 130/76  Pulse: 86  Resp: 16  SpO2: 98%  Weight: 239 lb (108.4 kg)  Height: 5\' 4"  (1.626 m)    Body mass index is 41.02 kg/m.  Physical Exam Vitals and nursing note reviewed.  Constitutional:      General: She is not in acute distress.    Appearance: Normal appearance. She is well-developed. She is obese. She is not ill-appearing, toxic-appearing or diaphoretic.  HENT:     Head: Normocephalic and atraumatic.     Nose: Nose normal.  Eyes:     General:        Right eye: No discharge.        Left eye: No discharge.     Conjunctiva/sclera: Conjunctivae normal.  Neck:     Trachea: No tracheal deviation.  Cardiovascular:     Rate and Rhythm: Normal rate and  regular rhythm.     Pulses: Normal pulses.     Heart sounds: Normal heart sounds.  Pulmonary:     Effort: Pulmonary effort is normal. No respiratory distress.     Breath sounds: Normal breath sounds. No stridor. No wheezing or rales.  Musculoskeletal:        General: Normal range of motion.  Skin:    General: Skin is warm and dry.     Findings: No rash.  Neurological:     Mental Status: She is alert.     Motor: No abnormal muscle tone.     Coordination: Coordination normal.  Psychiatric:        Mood and Affect: Mood normal.        Behavior: Behavior normal.      Functional Status Survey:   Results for orders placed or performed in visit on 06/03/23  Microalbumin / creatinine urine ratio   Collection Time: 06/03/23  9:34 AM  Result Value Ref Range   Creatinine, Urine 59 20 - 275 mg/dL   Microalb, Ur <1.6 mg/dL   Microalb Creat Ratio NOTE <30 mg/g creat  HgB A1c   Collection Time: 06/03/23  9:34 AM  Result Value Ref Range   Hgb A1c MFr Bld 6.9 (H) <5.7 % of total Hgb   Mean Plasma Glucose 151 mg/dL   eAG (mmol/L) 8.4 mmol/L  Lipid Profile   Collection Time: 06/03/23  9:34 AM  Result Value Ref Range   Cholesterol 121 <200 mg/dL   HDL 58 > OR = 50 mg/dL   Triglycerides 89 <109 mg/dL   LDL Cholesterol (Calc) 46 mg/dL (calc)   Total CHOL/HDL Ratio 2.1 <5.0 (calc)   Non-HDL Cholesterol (Calc) 63 <604 mg/dL (calc)  COMPLETE METABOLIC PANEL WITH GFR   Collection Time: 06/03/23  9:34 AM  Result Value  Ref Range   Glucose, Bld 108 (H) 65 - 99 mg/dL   BUN 12 7 - 25 mg/dL   Creat 4.09 8.11 - 9.14 mg/dL   eGFR 67 > OR = 60 NW/GNF/6.21H0   BUN/Creatinine Ratio SEE NOTE: 6 - 22 (calc)   Sodium 142 135 - 146 mmol/L   Potassium 4.9 3.5 - 5.3 mmol/L   Chloride 102 98 - 110 mmol/L   CO2 30 20 - 32 mmol/L   Calcium 9.6 8.6 - 10.4 mg/dL   Total Protein 7.1 6.1 - 8.1 g/dL   Albumin 4.2 3.6 - 5.1 g/dL   Globulin 2.9 1.9 - 3.7 g/dL (calc)   AG Ratio 1.4 1.0 - 2.5 (calc)   Total  Bilirubin 0.6 0.2 - 1.2 mg/dL   Alkaline phosphatase (APISO) 54 37 - 153 U/L   AST 16 10 - 35 U/L   ALT 16 6 - 29 U/L  CBC w/Diff/Platelet   Collection Time: 06/03/23  9:34 AM  Result Value Ref Range   WBC 7.6 3.8 - 10.8 Thousand/uL   RBC 5.00 3.80 - 5.10 Million/uL   Hemoglobin 15.3 11.7 - 15.5 g/dL   HCT 86.5 (H) 78.4 - 69.6 %   MCV 91.6 80.0 - 100.0 fL   MCH 30.6 27.0 - 33.0 pg   MCHC 33.4 32.0 - 36.0 g/dL   RDW 29.5 28.4 - 13.2 %   Platelets 226 140 - 400 Thousand/uL   MPV 10.3 7.5 - 12.5 fL   Neutro Abs 2,987 1,500 - 7,800 cells/uL   Absolute Lymphocytes 3,222 850 - 3,900 cells/uL   Absolute Monocytes 958 (H) 200 - 950 cells/uL   Eosinophils Absolute 350 15 - 500 cells/uL   Basophils Absolute 84 0 - 200 cells/uL   Neutrophils Relative % 39.3 %   Total Lymphocyte 42.4 %   Monocytes Relative 12.6 %   Eosinophils Relative 4.6 %   Basophils Relative 1.1 %      Assessment & Plan:   Type 2 diabetes mellitus with diabetic polyneuropathy, without long-term current use of insulin (HCC)   A1C increasing but still at goal Lab Results  Component Value Date   HGBA1C 6.9 (H) 06/03/2023   HGBA1C 6.8 (H) 12/05/2022   HGBA1C 6.7 (H) 04/15/2022   HGBA1C 6.5 (H) 10/08/2021   HGBA1C 6.1 (H) 03/02/2021   HGBA1C 6.2 (H) 07/03/2020   Was unable to get ozempic refilled due to price about a month ago She started it x 8 weeks at 0.25 mg dose, lost some weight, tolerated it with only small amount of bloating on day of shot She felt full longer, eating less, not checking sugars Will defer labs until next OV 93 months) Refilled ozempic - recommend if she is able to get the prescription and afford it to do 2 weeks of 0.25 mg dose and then increase to 0.5 and follow-up in 3 months      ICD-10-CM   1. Type 2 diabetes mellitus with diabetic polyneuropathy, without long-term current use of insulin (HCC)  E11.42   See above      2. Chronic diastolic heart failure (HCC)  G40.10  Semaglutide,0.25 or 0.5MG /DOS, (OZEMPIC, 0.25 OR 0.5 MG/DOSE,) 2 MG/3ML SOPN   managed per cardiology, pt appears euvolemic today    3. Essential hypertension  I10 lisinopril (ZESTRIL) 10 MG tablet   BP at goal today, meds refilled    4. Stage 3a chronic kidney disease (HCC)  N18.31 lisinopril (ZESTRIL) 10 MG tablet   last  labs reviewed, monitoring, BP is at goal and well controlled, on ACEI    5. Hyperlipidemia, unspecified hyperlipidemia type  E78.5 rosuvastatin (CRESTOR) 10 MG tablet   Patient has been compliant with Crestor and Zetia, no side effects or concerns Refilled meds, she is not due for labs    6. Aortic atherosclerosis (HCC)  I70.0 rosuvastatin (CRESTOR) 10 MG tablet   On statin, monitoring    7. Paroxysmal tachycardia (HCC) Chronic I47.9    per cardiology on BB, sx well controlled    8. Class 3 severe obesity with serious comorbidity and body mass index (BMI) of 40.0 to 44.9 in adult, unspecified obesity type (HCC)  Z61.096    E66.01    Z68.41    worsening weight with associated atherosclerotic disease, HTN, HLD, CKD stage 3a, T2DM Obesity - unfortunately no improvement with the one pen of ozempic Wt Readings from Last 5 Encounters:  09/03/23 239 lb (108.4 kg)  06/03/23 231 lb 12.8 oz (105.1 kg)  04/16/23 231 lb 9.6 oz (105.1 kg)  03/25/23 233 lb 2 oz (105.7 kg)  02/11/23 231 lb 9.6 oz (105.1 kg)   BMI Readings from Last 5 Encounters:  09/03/23 41.02 kg/m  06/03/23 39.79 kg/m  04/16/23 39.75 kg/m  03/25/23 40.02 kg/m  02/11/23 39.75 kg/m         Return in about 3 months (around 12/01/2023) for Routine follow-up HTN A1C in office.   Danelle Berry, PA-C 09/03/23 10:02 AM

## 2023-09-23 ENCOUNTER — Ambulatory Visit: Payer: Medicare HMO

## 2023-09-23 DIAGNOSIS — E538 Deficiency of other specified B group vitamins: Secondary | ICD-10-CM

## 2023-09-23 MED ORDER — CYANOCOBALAMIN 1000 MCG/ML IJ SOLN
1000.0000 ug | Freq: Once | INTRAMUSCULAR | Status: AC
Start: 2023-09-23 — End: 2023-09-23
  Administered 2023-09-23: 1000 ug via INTRAMUSCULAR

## 2023-09-23 NOTE — Progress Notes (Signed)
 Patient is in office today for a nurse visit for B12 Injection. Patient Injection was given in the  Left deltoid. Patient tolerated injection well.

## 2023-10-10 ENCOUNTER — Telehealth: Payer: Self-pay

## 2023-10-10 NOTE — Telephone Encounter (Signed)
 Copied from CRM [id number]. Topic: General - Other >> Oct 09, 2023  4:46 PM Tiffany S wrote: Reason for CRM: Ozempic patient assistance program needs paperwork filled out please follow up with patient regarding status

## 2023-10-27 ENCOUNTER — Ambulatory Visit

## 2023-10-27 DIAGNOSIS — E538 Deficiency of other specified B group vitamins: Secondary | ICD-10-CM

## 2023-10-27 MED ORDER — CYANOCOBALAMIN 1000 MCG/ML IJ SOLN
1000.0000 ug | Freq: Once | INTRAMUSCULAR | Status: AC
  Administered 2023-10-27: 1000 ug via INTRAMUSCULAR

## 2023-10-27 NOTE — Progress Notes (Signed)
 Patient is in office today for a nurse visit for B12 Injection. Patient Injection was given in the  Left deltoid. Patient tolerated injection well.

## 2023-10-30 ENCOUNTER — Encounter: Payer: Self-pay | Admitting: Family Medicine

## 2023-11-11 DIAGNOSIS — E119 Type 2 diabetes mellitus without complications: Secondary | ICD-10-CM | POA: Diagnosis not present

## 2023-11-11 DIAGNOSIS — H269 Unspecified cataract: Secondary | ICD-10-CM | POA: Diagnosis not present

## 2023-11-11 LAB — HM DIABETES EYE EXAM

## 2023-11-12 ENCOUNTER — Telehealth: Payer: Self-pay

## 2023-11-12 NOTE — Progress Notes (Signed)
 Pharmacy Medication Assistance Program Note    11/12/2023  Patient ID: Jacqueline [name] [facility], female   DOB: 02-02-1951, 73 y.o.   MRN: [id number]     11/12/2023  Outreach Medication One  Manufacturer Medication One Novo Nordisk  [employer] Ozempic   Type of Radiographer, therapeutic Assistance  Name of Prescriber Adeline Hone  Patient Assistance Determination Approved  Approval Start Date 11/11/2023  Approval End Date 07/07/2024  Patient Notification Method MyChart     Signature

## 2023-11-12 NOTE — Telephone Encounter (Signed)
 PAP: Patient assistance application for Ozempic  has been approved by PAP Companies: [employer] from 11/11/2023 to 07/07/2024. Medication should be delivered to PAP Delivery: Provider's office. For further shipping updates, please contact [employer] at [phone]. Patient ID is: [id number]

## 2023-11-25 ENCOUNTER — Ambulatory Visit: Payer: Managed Care, Other (non HMO) | Admitting: Dermatology

## 2023-11-25 ENCOUNTER — Ambulatory Visit

## 2023-11-25 DIAGNOSIS — D235 Other benign neoplasm of skin of trunk: Secondary | ICD-10-CM | POA: Diagnosis not present

## 2023-11-25 DIAGNOSIS — W908XXA Exposure to other nonionizing radiation, initial encounter: Secondary | ICD-10-CM | POA: Diagnosis not present

## 2023-11-25 DIAGNOSIS — L820 Inflamed seborrheic keratosis: Secondary | ICD-10-CM | POA: Diagnosis not present

## 2023-11-25 DIAGNOSIS — Z1283 Encounter for screening for malignant neoplasm of skin: Secondary | ICD-10-CM

## 2023-11-25 DIAGNOSIS — L814 Other melanin hyperpigmentation: Secondary | ICD-10-CM

## 2023-11-25 DIAGNOSIS — D1801 Hemangioma of skin and subcutaneous tissue: Secondary | ICD-10-CM | POA: Diagnosis not present

## 2023-11-25 DIAGNOSIS — L578 Other skin changes due to chronic exposure to nonionizing radiation: Secondary | ICD-10-CM | POA: Diagnosis not present

## 2023-11-25 DIAGNOSIS — E538 Deficiency of other specified B group vitamins: Secondary | ICD-10-CM

## 2023-11-25 DIAGNOSIS — L821 Other seborrheic keratosis: Secondary | ICD-10-CM | POA: Diagnosis not present

## 2023-11-25 DIAGNOSIS — D229 Melanocytic nevi, unspecified: Secondary | ICD-10-CM

## 2023-11-25 MED ORDER — CYANOCOBALAMIN 1000 MCG/ML IJ SOLN
1000.0000 ug | Freq: Once | INTRAMUSCULAR | Status: AC
  Administered 2023-11-25: 1000 ug via INTRAMUSCULAR

## 2023-11-25 NOTE — Progress Notes (Signed)
 Follow-Up Visit   Subjective  Jacqueline [name] is a 73 y.o. female who presents for the following: Skin Cancer Screening and Full Body Skin Exam.  Place at L chest- raised and scaly, irritated by bra, check R ear.    The patient presents for Total-Body Skin Exam (TBSE) for skin cancer screening and mole check. The patient has spots, moles and lesions to be evaluated, some may be new or changing and the patient may have concern these could be cancer.   The following portions of the chart were reviewed this encounter and updated as appropriate: medications, allergies, medical history  Review of Systems:  No other skin or systemic complaints except as noted in HPI or Assessment and Plan.  Objective  Well appearing patient in no apparent distress; mood and affect are within normal limits.  A full examination was performed including scalp, head, eyes, ears, nose, lips, neck, chest, axillae, abdomen, back, buttocks, bilateral upper extremities, bilateral lower extremities, hands, feet, fingers, toes, fingernails, and toenails. All findings within normal limits unless otherwise noted below.   Relevant physical exam findings are noted in the Assessment and Plan.  L chest x1, R chest x1, R shoulder x1 (3) Stuck on waxy paps with erythema L chest with bleeding  Assessment & Plan   SKIN CANCER SCREENING PERFORMED TODAY.  ACTINIC DAMAGE - Chronic condition, secondary to cumulative UV/sun exposure - diffuse scaly erythematous macules with underlying dyspigmentation - Recommend daily broad spectrum sunscreen SPF 30+ to sun-exposed areas, reapply every 2 hours as needed.  - Staying in the shade or wearing long sleeves, sun glasses (UVA+UVB protection) and wide brim hats (4-inch brim around the entire circumference of the hat) are also recommended for sun protection.  - Call for new or changing lesions.  LENTIGINES, SEBORRHEIC KERATOSES, HEMANGIOMAS - Benign normal skin lesions -  Benign-appearing - Call for any changes  SEBORRHEIC KERATOSIS - Stuck-on, waxy, tan-brown papules and/or plaques at R chest, R mid ear helix - Benign-appearing - Discussed benign etiology and prognosis. - Observe - Call for any changes  MELANOCYTIC NEVI - Tan-brown and/or pink-flesh-colored symmetric macules and papules - Benign appearing on exam today - Observation - Call clinic for new or changing moles - Recommend daily use of broad spectrum spf 30+ sunscreen to sun-exposed areas.   Seborrheic Keratosis vs Epidermal Nevus  Exam: Left Flank- Irregular 4 x 2.5cm brown waxy plaque, present since childhood per patient. Stable compared to photo.   Benign-appearing. Observation. Call clinic for new or changing lesions. Recommend daily use of broad spectrum spf 30+ sunscreen to sun-exposed areas.    INFLAMED SEBORRHEIC KERATOSIS (3) L chest x1, R chest x1, R shoulder x1 (3) Symptomatic, irritating, patient would like treated. Destruction of lesion - L chest x1, R chest x1, R shoulder x1 (3)  Destruction method: cryotherapy   Informed consent: discussed and consent obtained   Lesion destroyed using liquid nitrogen: Yes   Region frozen until ice ball extended beyond lesion: Yes   Outcome: patient tolerated procedure well with no complications   Post-procedure details: wound care instructions given   Additional details:  Prior to procedure, discussed risks of blister formation, small wound, skin dyspigmentation, or rare scar following cryotherapy. Recommend Vaseline ointment to treated areas while healing.   Return in about 1 year (around 11/24/2024) for w/ Dr. Annette Barters, TBSE.  I, Hector Littles, CMA, am acting as scribe for Artemio Larry, MD .   Documentation: I have reviewed the above documentation for  accuracy and completeness, and I agree with the above.  Artemio Larry, MD

## 2023-11-25 NOTE — Patient Instructions (Addendum)

## 2023-12-03 ENCOUNTER — Ambulatory Visit: Payer: Medicare HMO | Admitting: Family Medicine

## 2023-12-23 ENCOUNTER — Ambulatory Visit (INDEPENDENT_AMBULATORY_CARE_PROVIDER_SITE_OTHER)

## 2023-12-23 DIAGNOSIS — E538 Deficiency of other specified B group vitamins: Secondary | ICD-10-CM | POA: Diagnosis not present

## 2023-12-23 MED ORDER — CYANOCOBALAMIN 1000 MCG/ML IJ SOLN
1000.0000 ug | Freq: Once | INTRAMUSCULAR | Status: AC
  Administered 2023-12-23: 1000 ug via INTRAMUSCULAR

## 2023-12-23 NOTE — Progress Notes (Signed)
 Patient is in office today for a nurse visit for B12 Injection. Patient Injection was given in the  Left deltoid. Patient tolerated injection well.

## 2023-12-24 ENCOUNTER — Other Ambulatory Visit: Payer: Self-pay | Admitting: Family Medicine

## 2023-12-24 DIAGNOSIS — G4700 Insomnia, unspecified: Secondary | ICD-10-CM

## 2023-12-26 NOTE — Telephone Encounter (Signed)
 Requested Prescriptions  Pending Prescriptions Disp Refills   traZODone  (DESYREL ) 50 MG tablet [Pharmacy Med Name: TRAZODONE  50 MG TABLET] 90 tablet 0    Sig: TAKE 1 TABLET BY MOUTH EVERY DAY AT BEDTIME     Psychiatry: Antidepressants - Serotonin Modulator Passed - 12/26/2023  2:59 PM      Passed - Valid encounter within last 6 months    Recent Outpatient Visits           3 months ago Type 2 diabetes mellitus with diabetic polyneuropathy, without long-term current use of insulin [facility])   [facility] Adeline Hone, PA-C

## 2024-01-14 DIAGNOSIS — R202 Paresthesia of skin: Secondary | ICD-10-CM | POA: Diagnosis not present

## 2024-01-14 DIAGNOSIS — R200 Anesthesia of skin: Secondary | ICD-10-CM | POA: Diagnosis not present

## 2024-01-14 DIAGNOSIS — G629 Polyneuropathy, unspecified: Secondary | ICD-10-CM | POA: Diagnosis not present

## 2024-01-14 DIAGNOSIS — R252 Cramp and spasm: Secondary | ICD-10-CM | POA: Diagnosis not present

## 2024-01-20 ENCOUNTER — Ambulatory Visit

## 2024-01-21 ENCOUNTER — Ambulatory Visit

## 2024-01-21 DIAGNOSIS — E538 Deficiency of other specified B group vitamins: Secondary | ICD-10-CM

## 2024-01-21 MED ORDER — CYANOCOBALAMIN 1000 MCG/ML IJ SOLN
1000.0000 ug | Freq: Once | INTRAMUSCULAR | Status: AC
  Administered 2024-01-21: 1000 ug via INTRAMUSCULAR

## 2024-01-21 NOTE — Progress Notes (Signed)
 Patient is in office today for a nurse visit for B12 Injection. Patient Injection was given in the  Left deltoid. Patient tolerated injection well.

## 2024-02-02 ENCOUNTER — Other Ambulatory Visit: Payer: Self-pay | Admitting: Pharmacist

## 2024-02-02 ENCOUNTER — Encounter: Payer: Self-pay | Admitting: Pharmacist

## 2024-02-02 DIAGNOSIS — E1142 Type 2 diabetes mellitus with diabetic polyneuropathy: Secondary | ICD-10-CM

## 2024-02-02 NOTE — Progress Notes (Signed)
   02/02/2024  Patient ID: Jacqueline [name] [facility], female   DOB: 28-Jun-1951, 73 y.o.   MRN: [id number]  Patient enrolled in Ozempic  patient assistance program from [employer].  As of 7/25, Ozempic  will no longer be eligible for automatic refill from [employer]. To request refills after this date, program must receive a refill request form from office up to 30 days prior to refill being due.  Outreach to patient today and provide this update. Advise patient to monitor supply of Ozempic  at home and when has only a 1 month supply remaining, to contact Shasta Spear: Phone: [phone] to request CPhT start refill form with PCP to be sent to program.  Sharyle Sia, PharmD, [facility] Clinical Pharmacist [facility] [phone]

## 2024-02-02 NOTE — Patient Instructions (Signed)
 As of 7/25, Ozempic  will no longer be automatically refilled from [employer] patient assistance program. To request refills after this date, program must receive a refill request form from office up to 30 days prior to refill being due.  Please monitor your supply of Ozempic  at home and when you have only a 1 month supply remaining, contact Shasta Spear at [phone] to request she start refill process for you.  Thank you!  Sharyle Sia, PharmD, [facility] Clinical Pharmacist [facility] [phone]

## 2024-02-24 ENCOUNTER — Ambulatory Visit (INDEPENDENT_AMBULATORY_CARE_PROVIDER_SITE_OTHER)

## 2024-02-24 DIAGNOSIS — E538 Deficiency of other specified B group vitamins: Secondary | ICD-10-CM | POA: Diagnosis not present

## 2024-02-24 MED ORDER — CYANOCOBALAMIN 1000 MCG/ML IJ SOLN
1000.0000 ug | Freq: Once | INTRAMUSCULAR | Status: AC
  Administered 2024-02-24: 1000 ug via INTRAMUSCULAR

## 2024-02-24 NOTE — Progress Notes (Signed)
 Patient is in office today for a nurse visit for B12 Injection. Patient Injection was given in the  Left deltoid. Patient tolerated injection well.

## 2024-03-25 ENCOUNTER — Ambulatory Visit (INDEPENDENT_AMBULATORY_CARE_PROVIDER_SITE_OTHER)

## 2024-03-25 ENCOUNTER — Other Ambulatory Visit: Payer: Self-pay | Admitting: Family Medicine

## 2024-03-25 DIAGNOSIS — E538 Deficiency of other specified B group vitamins: Secondary | ICD-10-CM

## 2024-03-25 DIAGNOSIS — G4700 Insomnia, unspecified: Secondary | ICD-10-CM

## 2024-03-25 MED ORDER — CYANOCOBALAMIN 1000 MCG/ML IJ SOLN
1000.0000 ug | Freq: Once | INTRAMUSCULAR | Status: AC
  Administered 2024-03-25: 1000 ug via INTRAMUSCULAR

## 2024-03-25 NOTE — Telephone Encounter (Signed)
 Requested Prescriptions  Pending Prescriptions Disp Refills   traZODone  (DESYREL ) 50 MG tablet [Pharmacy Med Name: TRAZODONE  50 MG TABLET] 90 tablet 0    Sig: TAKE 1 TABLET BY MOUTH EVERYDAY AT BEDTIME     Psychiatry: Antidepressants - Serotonin Modulator Failed - 03/25/2024  4:14 PM      Failed - Valid encounter within last 6 months    Recent Outpatient Visits           6 months ago Type 2 diabetes mellitus with diabetic polyneuropathy, without long-term current use of insulin [facility])   [facility] Leavy Mole, PA-C

## 2024-03-25 NOTE — Progress Notes (Signed)
 Patient is in office today for a nurse visit for B12 Injection. Patient Injection was given in the  Right deltoid. Patient tolerated injection well.

## 2024-03-26 ENCOUNTER — Telehealth: Payer: Self-pay

## 2024-03-26 NOTE — Progress Notes (Signed)
 Attempted to contact patient to reschedule with clinical pharmacist. Patient is due for yearly A1c and UACR at this time.  Will try again 3-5 business days.  Anetta Olvera E. Marsh, PharmD Clinical Pharmacist [facility] [phone]

## 2024-04-26 ENCOUNTER — Ambulatory Visit

## 2024-04-26 DIAGNOSIS — E538 Deficiency of other specified B group vitamins: Secondary | ICD-10-CM

## 2024-04-26 MED ORDER — CYANOCOBALAMIN 1000 MCG/ML IJ SOLN
1000.0000 ug | Freq: Once | INTRAMUSCULAR | Status: AC
  Administered 2024-04-26: 1000 ug via INTRAMUSCULAR

## 2024-05-01 ENCOUNTER — Other Ambulatory Visit: Payer: Self-pay | Admitting: Family Medicine

## 2024-05-01 DIAGNOSIS — I700 Atherosclerosis of aorta: Secondary | ICD-10-CM

## 2024-05-01 DIAGNOSIS — E785 Hyperlipidemia, unspecified: Secondary | ICD-10-CM

## 2024-05-03 ENCOUNTER — Other Ambulatory Visit: Payer: Self-pay | Admitting: Family Medicine

## 2024-05-03 ENCOUNTER — Ambulatory Visit

## 2024-05-03 ENCOUNTER — Telehealth: Payer: Self-pay

## 2024-05-03 VITALS — BP 136/80 | HR 82

## 2024-05-03 DIAGNOSIS — E1142 Type 2 diabetes mellitus with diabetic polyneuropathy: Secondary | ICD-10-CM | POA: Diagnosis not present

## 2024-05-03 DIAGNOSIS — N1831 Chronic kidney disease, stage 3a: Secondary | ICD-10-CM

## 2024-05-03 DIAGNOSIS — Z7985 Long-term (current) use of injectable non-insulin antidiabetic drugs: Secondary | ICD-10-CM | POA: Diagnosis not present

## 2024-05-03 DIAGNOSIS — Z1231 Encounter for screening mammogram for malignant neoplasm of breast: Secondary | ICD-10-CM

## 2024-05-03 DIAGNOSIS — I10 Essential (primary) hypertension: Secondary | ICD-10-CM

## 2024-05-03 LAB — COMPREHENSIVE METABOLIC PANEL WITH GFR
AG Ratio: 1.6 (calc) (ref 1.0–2.5)
ALT: 18 U/L (ref 6–29)
AST: 17 U/L (ref 10–35)
Albumin: 4.1 g/dL (ref 3.6–5.1)
Alkaline phosphatase (APISO): 51 U/L (ref 37–153)
BUN/Creatinine Ratio: 10 (calc) (ref 6–22)
BUN: 12 mg/dL (ref 7–25)
CO2: 29 mmol/L (ref 20–32)
Calcium: 9.2 mg/dL (ref 8.6–10.4)
Chloride: 103 mmol/L (ref 98–110)
Creat: 1.26 mg/dL — ABNORMAL HIGH (ref 0.60–1.00)
Globulin: 2.6 g/dL (ref 1.9–3.7)
Glucose, Bld: 124 mg/dL — ABNORMAL HIGH (ref 65–99)
Potassium: 4.7 mmol/L (ref 3.5–5.3)
Sodium: 141 mmol/L (ref 135–146)
Total Bilirubin: 0.7 mg/dL (ref 0.2–1.2)
Total Protein: 6.7 g/dL (ref 6.1–8.1)
eGFR: 45 mL/min/1.73m2 — ABNORMAL LOW (ref >=60)

## 2024-05-03 LAB — HEMOGLOBIN A1C
Hgb A1c MFr Bld: 6 % — ABNORMAL HIGH (ref <5.7)
Mean Plasma Glucose: 126 mg/dL
eAG (mmol/L): 7 mmol/L

## 2024-05-03 NOTE — Telephone Encounter (Signed)
 Requested Prescriptions  Pending Prescriptions Disp Refills   rosuvastatin  (CRESTOR ) 10 MG tablet [Pharmacy Med Name: ROSUVASTATIN  CALCIUM  10 MG TAB] 90 tablet 0    Sig: TAKE 1 TABLET BY MOUTH EVERY DAY     Cardiovascular:  Antilipid - Statins 2 Failed - 05/03/2024  4:03 PM      Failed - Lipid Panel in normal range within the last 12 months    Cholesterol, Total  Date Value Ref Range Status  04/12/2021 126 100 - 199 mg/dL Final   Cholesterol  Date Value Ref Range Status  06/03/2023 121 <200 mg/dL Final   LDL Cholesterol (Calc)  Date Value Ref Range Status  06/03/2023 46 mg/dL (calc) Final    Comment:    Reference range: <100 . Desirable range <100 mg/dL for primary prevention;   <70 mg/dL for patients with CHD or diabetic patients  with > or = 2 CHD risk factors. SABRA LDL-C is now calculated using the Martin-Hopkins  calculation, which is a validated novel method providing  better accuracy than the Friedewald equation in the  estimation of LDL-C.  Gladis APPLETHWAITE et al. SANDREA. 7986;689(80): 2061-2068  (http://education.[employer].com/faq/FAQ164)    Direct LDL  Date Value Ref Range Status  12/25/2006 149.5 mg/dL Final    Comment:    See lab report for associated comment(s)   HDL  Date Value Ref Range Status  06/03/2023 58 > OR = 50 mg/dL Final  89/93/7977 47 >60 mg/dL Final   Triglycerides  Date Value Ref Range Status  06/03/2023 89 <150 mg/dL Final         Passed - Cr in normal range and within 360 days    Creat  Date Value Ref Range Status  06/03/2023 0.91 0.60 - 1.00 mg/dL Final   Creatinine, Urine  Date Value Ref Range Status  06/03/2023 59 20 - 275 mg/dL Final         Passed - Patient is not pregnant      Passed - Valid encounter within last 12 months    Recent Outpatient Visits           8 months ago Type 2 diabetes mellitus with diabetic polyneuropathy, without long-term current use of insulin [facility])   [facility] Leavy Mole, PA-C

## 2024-05-03 NOTE — Telephone Encounter (Signed)
 Received provider portion Refill reorder form on ozempic  faxed to [employer] today

## 2024-05-03 NOTE — Progress Notes (Signed)
 S:    Reason for visit: ?  Jacqueline [name] is a 73 y.o. female with a history of diabetes (type 2), who presents today for an initial diabetes Face to Face pharmacotherapy visit.? Pertinent PMH also includes HTN,, stage 3a CKD, HLD, VitD deficiency, aortic atherosclerosis.   Care Team: Primary Care Provider: Leavy Mole, PA-C  Current diabetes medications include: Ozempic  0.5 mg weekly  Previous diabetes medications include: none Current hypertension medications include: lisinopril  10 mg daily, metoprolol  tartrate 25 mg BID Current hyperlipidemia medications include: rosuvastatin  10 mg daily, ezetimibe  10 mg daily  Patient reports adherence to taking all medications as prescribed.   Have you been experiencing any side effects to the medications prescribed? no Do you have any problems obtaining medications due to transportation or finances? Yes -cost of Ozempic  on insurance Insurance coverage: [employer] Medicare  Current medication access support: Ozempic  via [employer] through 06/2024  Reported home fasting blood sugars: not checking  Patient reported dietary habits: Eats 2-3 meals/day Breakfast: yogurt, toast, berries Lunch: chick-fil-a, sandwich, or salad Dinner: normally eats out with husband but will share a meal Snacks: not a big snacker - maybe peanut butter crackers Drinks: water, sugar-free Gatorade   Patient-reported exercise habits: walks dog on her property DM Prevention:  Statin: Taking; moderate intensity.?  History of chronic kidney disease? yes History of albuminuria? no Last eye exam: 11/2023 Lab Results  Component Value Date   HMDIABEYEEXA No Retinopathy 11/11/2023   Tobacco Use:  Tobacco Use: Medium Risk (01/14/2024)   Received from [facility]   Patient History    Smoking Tobacco Use: Former    Smokeless Tobacco Use: Never    Passive Exposure: Not on file   O:   Vitals:  Wt Readings from Last 3 Encounters:  09/03/23 239 lb  (108.4 kg)  06/03/23 231 lb 12.8 oz (105.1 kg)  04/16/23 231 lb 9.6 oz (105.1 kg)   BP Readings from Last 3 Encounters:  09/03/23 130/76  06/03/23 130/76  04/16/23 (!) 145/88   Pulse Readings from Last 3 Encounters:  09/03/23 86  06/03/23 74  04/16/23 72     Labs:?  Lab Results  Component Value Date   HGBA1C 6.9 (H) 06/03/2023   HGBA1C 6.8 (H) 12/05/2022   HGBA1C 6.7 (H) 04/15/2022   GLUCOSE 108 (H) 06/03/2023   MICRALBCREAT NOTE 06/03/2023   MICRALBCREAT 2 04/15/2022   MICRALBCREAT NOTE 12/28/2019   CREATININE 0.91 06/03/2023   CREATININE 0.88 12/05/2022   CREATININE 0.99 04/15/2022   GFR 45.58 (L) 04/30/2019   GFR 63.97 03/24/2019   GFR 62.41 10/15/2018    Lab Results  Component Value Date   CHOL 121 06/03/2023   LDLCALC 46 06/03/2023   LDLCALC 51 04/15/2022   LDLCALC 62 04/12/2021   LDLDIRECT 149.5 12/25/2006   HDL 58 06/03/2023   TRIG 89 06/03/2023   TRIG 100 04/15/2022   TRIG 90 04/12/2021   ALT 16 06/03/2023   ALT 22 12/05/2022   AST 16 06/03/2023   AST 17 12/05/2022      Chemistry      Component Value Date/Time   NA 142 06/03/2023 0934   NA 139 06/01/2019 0837   K 4.9 06/03/2023 0934   CL 102 06/03/2023 0934   CO2 30 06/03/2023 0934   BUN 12 06/03/2023 0934   BUN 13 06/01/2019 0837   CREATININE 0.91 06/03/2023 0934      Component Value Date/Time   CALCIUM  9.6 06/03/2023 0934  ALKPHOS 53 01/09/2022 1431   AST 16 06/03/2023 0934   ALT 16 06/03/2023 0934   BILITOT 0.6 06/03/2023 0934   BILITOT 0.6 06/01/2019 0837       The ASCVD Risk score (Arnett DK, et al., 2019) failed to calculate for the following reasons:   The valid total cholesterol range is 130 to 320 mg/dL  Lab Results  Component Value Date   MICRALBCREAT NOTE 06/03/2023   MICRALBCREAT 2 04/15/2022   MICRALBCREAT NOTE 12/28/2019    A/P: Diabetes currently controlled with a most recent A1c of 6.9% on 06/03/23. Unable to fully assess control without any BG readings. Novo  Nordisk will no longer be offering Ozempic  via PAP for Medicare beneficiaries. Dicussed possibly meeting with a [employer] insurance rep as it is the open enrollment period, but patient is not interested at this time. Pending updated A1c, will determine best plan moving forward. If A1c remains unchanged, may be able to remain controlled with an SGLT2i and/or biguanide via PAP in 2025. Patient should have enough Ozempic  to last until January/February if refill is requested from the company at this time.  -Continued GLP-1 Ozempic  (semaglutide ) 0.5 mg weekly. Refill requested from PAP today -Obtained A1c and CMP -Extensively discussed pathophysiology of diabetes, recommended lifestyle interventions, dietary effects on blood sugar control.   ASCVD risk - secondary prevention in patient with diabetes. Last LDL is 46 mg/dL, at goal of <44 mg/dL.  -Continued rosuvastatin  10 mg daily.  -Continued ezetimibe  10 mg daily   Hypertension longstanding currently elevated in clinic, however, she has not had her BP medications today. Blood pressure goal of <130/80 mmHg. Medication adherence appropriate.  -Continued lisinopril  10 mg daily. -Continued metoprolol  tartrate 25 mg BID  Patient verbalized understanding of treatment plan. Total time patient counseling 30 minutes.  Follow-up:  Pharmacist on 07/20/23 PCP clinic visit on 05/27/24  Peyton CHARLENA Ferries, PharmD Clinical Pharmacist [facility] [phone]

## 2024-05-04 ENCOUNTER — Ambulatory Visit: Payer: Self-pay | Admitting: Nurse Practitioner

## 2024-05-04 NOTE — Telephone Encounter (Signed)
 Requested Prescriptions  Pending Prescriptions Disp Refills   lisinopril  (ZESTRIL ) 10 MG tablet [Pharmacy Med Name: LISINOPRIL  10 MG TABLET] 90 tablet 1    Sig: TAKE 1 TABLET BY MOUTH EVERY DAY     Cardiovascular:  ACE Inhibitors Failed - 05/04/2024  1:10 PM      Failed - Cr in normal range and within 180 days    Creat  Date Value Ref Range Status  05/03/2024 1.26 (H) 0.60 - 1.00 mg/dL Final   Creatinine, Urine  Date Value Ref Range Status  06/03/2023 59 20 - 275 mg/dL Final         Failed - Valid encounter within last 6 months    Recent Outpatient Visits           8 months ago Type 2 diabetes mellitus with diabetic polyneuropathy, without long-term current use of insulin [facility])   [facility] [place], Michelene, PA-C              Passed - K in normal range and within 180 days    Potassium  Date Value Ref Range Status  05/03/2024 4.7 3.5 - 5.3 mmol/L Final         Passed - Patient is not pregnant      Passed - Last BP in normal range    BP Readings from Last 1 Encounters:  05/03/24 136/80

## 2024-05-06 ENCOUNTER — Other Ambulatory Visit (HOSPITAL_COMMUNITY): Payer: Self-pay

## 2024-05-07 ENCOUNTER — Other Ambulatory Visit: Payer: Self-pay | Admitting: Cardiovascular Disease

## 2024-05-12 ENCOUNTER — Other Ambulatory Visit: Admitting: Pharmacist

## 2024-05-12 ENCOUNTER — Encounter: Payer: Self-pay | Admitting: Family Medicine

## 2024-05-12 NOTE — Telephone Encounter (Signed)
 [employer] [facility] Received a letter saying we are missing Imf on application,spoke with a representative explain they were missing date on provider portion but had fixed it and faxed back to then, they are mailing out pt's med and it will take 10-14 days from 05/06/24.

## 2024-05-27 ENCOUNTER — Ambulatory Visit

## 2024-05-27 ENCOUNTER — Encounter: Payer: Self-pay | Admitting: Nurse Practitioner

## 2024-05-27 ENCOUNTER — Ambulatory Visit (INDEPENDENT_AMBULATORY_CARE_PROVIDER_SITE_OTHER): Admitting: Nurse Practitioner

## 2024-05-27 VITALS — BP 138/84 | HR 60 | Temp 98.0°F | Ht 64.0 in | Wt 225.0 lb

## 2024-05-27 DIAGNOSIS — E538 Deficiency of other specified B group vitamins: Secondary | ICD-10-CM

## 2024-05-27 DIAGNOSIS — Z7985 Long-term (current) use of injectable non-insulin antidiabetic drugs: Secondary | ICD-10-CM | POA: Diagnosis not present

## 2024-05-27 DIAGNOSIS — Z23 Encounter for immunization: Secondary | ICD-10-CM | POA: Diagnosis not present

## 2024-05-27 DIAGNOSIS — M8589 Other specified disorders of bone density and structure, multiple sites: Secondary | ICD-10-CM | POA: Diagnosis not present

## 2024-05-27 DIAGNOSIS — I5032 Chronic diastolic (congestive) heart failure: Secondary | ICD-10-CM

## 2024-05-27 DIAGNOSIS — I479 Paroxysmal tachycardia, unspecified: Secondary | ICD-10-CM | POA: Diagnosis not present

## 2024-05-27 DIAGNOSIS — G629 Polyneuropathy, unspecified: Secondary | ICD-10-CM

## 2024-05-27 DIAGNOSIS — I10 Essential (primary) hypertension: Secondary | ICD-10-CM | POA: Diagnosis not present

## 2024-05-27 DIAGNOSIS — M5442 Lumbago with sciatica, left side: Secondary | ICD-10-CM | POA: Diagnosis not present

## 2024-05-27 DIAGNOSIS — N1831 Chronic kidney disease, stage 3a: Secondary | ICD-10-CM

## 2024-05-27 DIAGNOSIS — E1142 Type 2 diabetes mellitus with diabetic polyneuropathy: Secondary | ICD-10-CM | POA: Diagnosis not present

## 2024-05-27 DIAGNOSIS — E782 Mixed hyperlipidemia: Secondary | ICD-10-CM | POA: Diagnosis not present

## 2024-05-27 DIAGNOSIS — G8929 Other chronic pain: Secondary | ICD-10-CM

## 2024-05-27 MED ORDER — PREDNISONE 10 MG (21) PO TBPK: ORAL_TABLET | ORAL | 0 refills | Status: AC

## 2024-05-27 MED ORDER — CYANOCOBALAMIN 1000 MCG/ML IJ SOLN
1000.0000 ug | Freq: Once | INTRAMUSCULAR | Status: AC
  Administered 2024-05-27: 1000 ug via INTRAMUSCULAR

## 2024-05-27 NOTE — Progress Notes (Signed)
 BP 138/84   Pulse 60   Temp 98 F (36.7 C)   Ht 5' 4 (1.626 m)   Wt 225 lb (102.1 kg)   SpO2 94%   BMI 38.62 kg/m    Subjective:    Patient ID: Jacqueline [name], female    DOB: 16-Dec-1950, 73 y.o.   MRN: [id number]  HPI: Jacqueline [name] is a 73 y.o. female  Chief Complaint  Patient presents with   Medication Management   Discussed the use of AI scribe software for clinical note transcription with the patient, who gave verbal consent to proceed.  History of Present Illness [name] is a 73 year old female who presents for a routine follow-up.  Low back pain - New onset pain localized across the lower back - Occurs with prolonged standing or walking - Absent when sitting or lying down, with occasional morning discomfort - No history of recent injury or fall - Pain has persisted for a few months - Ibuprofen used sparingly due to concerns about kidney function - gabapentin  taken for neuropathy does not alleviate back pain  Neuropathy - Gabapentin  300 mg in the morning and 600 mg at bedtime  Osteopenia - OsCal and vitamin D  supplementation for osteopenia  Cardiovascular risk management/heart failure/proxysmal tachycardia/HTN/hyperlipidemia - Aspirin 81 mg daily - Zetia  10 mg daily - Statin 10 mg daily - Lisinopril  10 mg daily - Metoprolol  25 mg twice a day  Diabetes management - Ozempic  0.5 mg weekly - Due for microalbumin urine test this month  Sleep disturbance - Trazodone  50 mg at bedtime  Vitamin deficiency - Monthly B12 injection    Wt Readings from Last 3 Encounters:  05/27/24 225 lb (102.1 kg)  09/03/23 239 lb (108.4 kg)  06/03/23 231 lb 12.8 oz (105.1 kg)    Flowsheet Row Office Visit from 05/27/2024 in [facility]  1 45 inches   Body mass index is 38.62 kg/m.      05/27/2024    2:33 PM 08/21/2023    8:39 AM 06/03/2023    8:40 AM  Depression screen PHQ 2/9  Decreased Interest 0 0 0  Down,  Depressed, Hopeless 0 0 0  PHQ - 2 Score 0 0 0  Altered sleeping  0   Tired, decreased energy  0   Change in appetite  0   Feeling bad or failure about yourself   0   Trouble concentrating  0   Moving slowly or fidgety/restless  0   Suicidal thoughts  0   PHQ-9 Score  0    Difficult doing work/chores  Not difficult at all      Data saved with a previous flowsheet row definition    Relevant past medical, surgical, family and social history reviewed and updated as indicated. Interim medical history since our last visit reviewed. Allergies and medications reviewed and updated.  Review of Systems  Constitutional: Negative for fever or weight change.  Respiratory: Negative for cough and shortness of breath.   Cardiovascular: Negative for chest pain or palpitations.  Gastrointestinal: Negative for abdominal pain, no bowel changes.  Musculoskeletal: Negative for gait problem or joint swelling.  Skin: Negative for rash.  Neurological: Negative for dizziness or headache.  No other specific complaints in a complete review of systems (except as listed in HPI above).      Objective:      BP 138/84   Pulse 60   Temp 98 F (36.7 C)   Ht  5' 4 (1.626 m)   Wt 225 lb (102.1 kg)   SpO2 94%   BMI 38.62 kg/m    Wt Readings from Last 3 Encounters:  05/27/24 225 lb (102.1 kg)  09/03/23 239 lb (108.4 kg)  06/03/23 231 lb 12.8 oz (105.1 kg)    Physical Exam VITALS: BP- 138/84 MEASUREMENTS: Weight- 225, BMI- 38.62. GENERAL: Alert, cooperative, well developed, no acute distress HEENT: Normocephalic, normal oropharynx, moist mucous membranes CHEST: Clear to auscultation bilaterally, no wheezes, rhonchi, or crackles CARDIOVASCULAR: Normal heart rate and rhythm, S1 and S2 normal without murmurs ABDOMEN: Soft, non-tender, non-distended, without organomegaly, normal bowel sounds EXTREMITIES: No cyanosis or edema NEUROLOGICAL: Cranial nerves grossly intact, moves all extremities without gross  motor or sensory deficit  Results for orders placed or performed in visit on 05/03/24  Comprehensive Metabolic Panel (CMET)   Collection Time: 05/03/24 10:05 AM  Result Value Ref Range   Glucose, Bld 124 (H) 65 - 99 mg/dL   BUN 12 7 - 25 mg/dL   Creat 8.73 (H) 9.39 - 1.00 mg/dL   eGFR 45 (L) > OR = 60 mL/min/1.67m2   BUN/Creatinine Ratio 10 6 - 22 (calc)   Sodium 141 135 - 146 mmol/L   Potassium 4.7 3.5 - 5.3 mmol/L   Chloride 103 98 - 110 mmol/L   CO2 29 20 - 32 mmol/L   Calcium  9.2 8.6 - 10.4 mg/dL   Total Protein 6.7 6.1 - 8.1 g/dL   Albumin 4.1 3.6 - 5.1 g/dL   Globulin 2.6 1.9 - 3.7 g/dL (calc)   AG Ratio 1.6 1.0 - 2.5 (calc)   Total Bilirubin 0.7 0.2 - 1.2 mg/dL   Alkaline phosphatase (APISO) 51 37 - 153 U/L   AST 17 10 - 35 U/L   ALT 18 6 - 29 U/L  HgB A1c   Collection Time: 05/03/24 10:05 AM  Result Value Ref Range   Hgb A1c MFr Bld 6.0 (H) <5.7 %   Mean Plasma Glucose 126 mg/dL   eAG (mmol/L) 7.0 mmol/L          Assessment & Plan:   Problem List Items Addressed This Visit       Cardiovascular and Mediastinum   Essential hypertension   Chronic diastolic heart failure (HCC) - Primary   Paroxysmal tachycardia (HCC)     Endocrine   DM (diabetes mellitus), type 2 (HCC)   Relevant Orders   Microalbumin / creatinine urine ratio     Nervous and Auditory   Neuropathy     Musculoskeletal and Integument   Osteopenia     Genitourinary   Stage 3a chronic kidney disease (HCC)     Other   HLD (hyperlipidemia)   B12 deficiency   Other Visit Diagnoses       Chronic bilateral low back pain with left-sided sciatica       Relevant Medications   predniSONE  (STERAPRED UNI-PAK 21 TAB) 10 MG (21) TBPK tablet     Immunization due       Relevant Orders   Flu vaccine HIGH DOSE PF(Fluzone Trivalent) (Completed)        Assessment and Plan Assessment & Plan Chronic diastolic heart failure Managed by cardiology. - Aspirin 81 mg daily - Zetia  10 mg daily -  Statin 10 mg daily - Lisinopril  10 mg daily - Metoprolol  25 mg twice a day  Essential hypertension Blood pressure is 138/84 mmHg, indicating controlled hypertension. - Aspirin 81 mg daily - Zetia  10 mg daily - Statin  10 mg daily - Lisinopril  10 mg daily - Metoprolol  25 mg twice a day  Paroxysmal tachycardia Managed by cardiology. - Aspirin 81 mg daily - Zetia  10 mg daily - Statin 10 mg daily - Lisinopril  10 mg daily - Metoprolol  25 mg twice a day  Type 2 diabetes mellitus with diabetic polyneuropathy A1c is 6.0, indicating good glycemic control. Neuropathy symptoms persist despite gabapentin  use. - Continue Ozempic  0.5 mg weekly. - Continue gabapentin  300 mg in the AM and 600 mg at bedtime.  Chronic kidney disease, stage 3a GFR is 45, indicating stage 3a chronic kidney disease. - Ordered microalbumin urine test.  Mixed hyperlipidemia Managed with Zetia  and a statin. - Continue Zetia  10 mg daily. - Continue statin 10 mg daily.  Osteopenia Managed with vitamin D  and OsCal. - Continue vitamin D  and OsCal.  Chronic lower back pain with sciatica Chronic lower back pain exacerbated by standing and walking, with sciatica symptoms. Pain is not relieved by current medications. Ibuprofen use is limited due to kidney function concerns. Physical therapy is considered but concerns about neuropathy-related cramps exist. - Initiated steroid taper to reduce inflammation. - Advised use of heat therapy for pain relief. - Recommended Tylenol for daily pain management and ibuprofen for severe pain. - Discussed potential referral to orthopedics or physical therapy if pain persists.  General Health Maintenance Routine health maintenance is up to date with mammogram, diabetic foot exam, and diabetic eye exam. B12 injection and flu shot are due. - Administered B12 injection. - Administered flu shot. - Ordered microalbumin urine test.        Follow up plan: Return in about 4 months  (around 09/24/2024) for follow up.

## 2024-05-28 ENCOUNTER — Ambulatory Visit: Payer: Self-pay | Admitting: Nurse Practitioner

## 2024-05-28 LAB — MICROALBUMIN / CREATININE URINE RATIO
Creatinine, Urine: 92 mg/dL (ref 20–275)
Microalb Creat Ratio: 5 mg/g{creat} (ref <30)
Microalb, Ur: 0.5 mg/dL

## 2024-06-01 ENCOUNTER — Other Ambulatory Visit: Payer: Self-pay | Admitting: Cardiovascular Disease

## 2024-06-01 DIAGNOSIS — I10 Essential (primary) hypertension: Secondary | ICD-10-CM

## 2024-06-01 DIAGNOSIS — I700 Atherosclerosis of aorta: Secondary | ICD-10-CM

## 2024-06-01 DIAGNOSIS — I5032 Chronic diastolic (congestive) heart failure: Secondary | ICD-10-CM

## 2024-06-07 ENCOUNTER — Ambulatory Visit
Admission: RE | Admit: 2024-06-07 | Discharge: 2024-06-07 | Disposition: A | Source: Ambulatory Visit | Attending: Family Medicine

## 2024-06-07 DIAGNOSIS — Z1231 Encounter for screening mammogram for malignant neoplasm of breast: Secondary | ICD-10-CM | POA: Insufficient documentation

## 2024-06-29 ENCOUNTER — Ambulatory Visit

## 2024-06-29 DIAGNOSIS — E538 Deficiency of other specified B group vitamins: Secondary | ICD-10-CM | POA: Diagnosis not present

## 2024-06-29 MED ORDER — CYANOCOBALAMIN 1000 MCG/ML IJ SOLN
1000.0000 ug | Freq: Once | INTRAMUSCULAR | Status: AC
  Administered 2024-06-29: 1000 ug via INTRAMUSCULAR

## 2024-07-05 ENCOUNTER — Other Ambulatory Visit: Payer: Self-pay | Admitting: Nurse Practitioner

## 2024-07-05 DIAGNOSIS — G4700 Insomnia, unspecified: Secondary | ICD-10-CM

## 2024-07-05 NOTE — Telephone Encounter (Unsigned)
 Copied from CRM #[id number]. Topic: Clinical - Medication Refill >> Jul 05, 2024  1:21 PM Olam RAMAN wrote: Medication: traZODone  (DESYREL ) 50 MG tablet   Has the patient contacted their pharmacy? Yes (Agent: If no, request that the patient contact the pharmacy for the refill. If patient does not wish to contact the pharmacy document the reason why and proceed with request.) (Agent: If yes, when and what did the pharmacy advise?)  This is the patient's preferred pharmacy:  [facility] [phone] [facility] [place] - [address] [place] [address] Phone: [phone] Fax: [phone]  [facility] [address] [place] - [address] [place] [address] Phone: [phone] Fax: [phone]  Is this the correct pharmacy for this prescription? Yes If no, delete pharmacy and type the correct one.   Has the prescription been filled recently? Yes  Is the patient out of the medication? Yes  Has the patient been seen for an appointment in the last year OR does the patient have an upcoming appointment? Yes  Can we respond through MyChart? Yes  Agent: Please be advised that Rx refills may take up to 3 business days. We ask that you follow-up with your pharmacy.

## 2024-07-06 MED ORDER — TRAZODONE HCL 50 MG PO TABS: 50.0000 mg | ORAL_TABLET | Freq: Every day | ORAL | 0 refills | Status: AC

## 2024-07-06 NOTE — Telephone Encounter (Signed)
 Requested Prescriptions  Pending Prescriptions Disp Refills   traZODone  (DESYREL ) 50 MG tablet 90 tablet 0    Sig: Take 1 tablet (50 mg total) by mouth at bedtime.     Psychiatry: Antidepressants - Serotonin Modulator Passed - 07/06/2024  4:28 PM      Passed - Valid encounter within last 6 months    Recent Outpatient Visits           1 month ago Chronic diastolic heart failure [facility])   [facility] Gareth Clarity F, FNP   10 months ago Type 2 diabetes mellitus with diabetic polyneuropathy, without long-term current use of insulin [facility])   [facility] Leavy Mole, PA-C

## 2024-07-19 ENCOUNTER — Ambulatory Visit (INDEPENDENT_AMBULATORY_CARE_PROVIDER_SITE_OTHER)

## 2024-07-19 DIAGNOSIS — Z7985 Long-term (current) use of injectable non-insulin antidiabetic drugs: Secondary | ICD-10-CM | POA: Diagnosis not present

## 2024-07-19 DIAGNOSIS — E1142 Type 2 diabetes mellitus with diabetic polyneuropathy: Secondary | ICD-10-CM | POA: Diagnosis not present

## 2024-07-19 MED ORDER — OZEMPIC (0.25 OR 0.5 MG/DOSE) 2 MG/3ML ~~LOC~~ SOPN: 0.5000 mg | PEN_INJECTOR | SUBCUTANEOUS | 2 refills | Status: AC

## 2024-07-19 NOTE — Progress Notes (Signed)
 "  S:    Reason for visit: ?  Jacqueline [name] is a 74 y.o. female with a history of diabetes (type 2), who presents today for a follow up diabetes Face to Face pharmacotherapy visit.? Pertinent PMH also includes HTN,, stage 3a CKD, HLD, VitD deficiency, aortic atherosclerosis.  They were referred to the pharmacist by their PCP for assistance in managing diabetes.   Care Team: Primary Care Provider: Gareth Mliss FALCON, FNP  Current diabetes medications include: Ozempic  0.5 mg weekly  Previous diabetes medications include: none Current hypertension medications include: lisinopril  10 mg daily, metoprolol  tartrate 25 mg BID Current hyperlipidemia medications include: rosuvastatin  10 mg daily, ezetimibe  10 mg daily  Patient reports adherence to taking all medications as prescribed.   Have you been experiencing any side effects to the medications prescribed? no Do you have any problems obtaining medications due to transportation or finances? Yes -cost of Ozempic  on insurance Insurance coverage: [employer]  Reported home fasting blood sugars: ~110 mg/dL  Patient reported dietary habits: Eats 2-3 meals/day Breakfast: yogurt, toast, berries Lunch: chick-fil-a, sandwich, or salad Dinner: normally eats out with husband but will share a meal Snacks: not a big snacker - maybe peanut butter crackers Drinks: water, sugar-free Gatorade   Patient-reported exercise habits: walks dog on her property DM Prevention:  Statin: Taking; moderate intensity.?  ACE/ARB: yes; lisinopril  Last urinary albumin/creatinine ratio:  Lab Results  Component Value Date   MICRALBCREAT 5 05/27/2024   MICRALBCREAT NOTE 06/03/2023   MICRALBCREAT 2 04/15/2022   MICRALBCREAT NOTE 12/28/2019   Last eye exam:  Lab Results  Component Value Date   HMDIABEYEEXA No Retinopathy 11/11/2023   Lab Results  Component Value Date   HMDIABEYEEXA No Retinopathy 11/11/2023   Last foot exam: 06/03/2023 Tobacco Use:   Tobacco Use: Medium Risk (05/27/2024)   Patient History    Smoking Tobacco Use: Former    Smokeless Tobacco Use: Never    Passive Exposure: Not on file     O:   Vitals:  Wt Readings from Last 3 Encounters:  05/27/24 225 lb (102.1 kg)  09/03/23 239 lb (108.4 kg)  06/03/23 231 lb 12.8 oz (105.1 kg)   BP Readings from Last 3 Encounters:  05/27/24 138/84  05/03/24 136/80  09/03/23 130/76   Pulse Readings from Last 3 Encounters:  05/27/24 60  05/03/24 82  09/03/23 86     Labs:?  Lab Results  Component Value Date   HGBA1C 6.0 (H) 05/03/2024   HGBA1C 6.9 (H) 06/03/2023   HGBA1C 6.8 (H) 12/05/2022   GLUCOSE 124 (H) 05/03/2024   MICRALBCREAT 5 05/27/2024   MICRALBCREAT NOTE 06/03/2023   MICRALBCREAT 2 04/15/2022   CREATININE 1.26 (H) 05/03/2024   CREATININE 0.91 06/03/2023   CREATININE 0.88 12/05/2022   GFR 45.58 (L) 04/30/2019   GFR 63.97 03/24/2019   GFR 62.41 10/15/2018    Lab Results  Component Value Date   CHOL 121 06/03/2023   LDLCALC 46 06/03/2023   LDLCALC 51 04/15/2022   LDLCALC 62 04/12/2021   LDLDIRECT 149.5 12/25/2006   HDL 58 06/03/2023   TRIG 89 06/03/2023   TRIG 100 04/15/2022   TRIG 90 04/12/2021   ALT 18 05/03/2024   ALT 16 06/03/2023   AST 17 05/03/2024   AST 16 06/03/2023      Chemistry      Component Value Date/Time   NA 141 05/03/2024 1005   NA 139 06/01/2019 0837   K 4.7 05/03/2024 1005   CL  103 05/03/2024 1005   CO2 29 05/03/2024 1005   BUN 12 05/03/2024 1005   BUN 13 06/01/2019 0837   CREATININE 1.26 (H) 05/03/2024 1005      Component Value Date/Time   CALCIUM  9.2 05/03/2024 1005   ALKPHOS 53 01/09/2022 1431   AST 17 05/03/2024 1005   ALT 18 05/03/2024 1005   BILITOT 0.7 05/03/2024 1005   BILITOT 0.6 06/01/2019 0837       The ASCVD Risk score (Arnett DK, et al., 2019) failed to calculate for the following reasons:   The valid total cholesterol range is 130 to 320 mg/dL  Lab Results  Component Value Date    MICRALBCREAT 5 05/27/2024   MICRALBCREAT NOTE 06/03/2023   MICRALBCREAT 2 04/15/2022   MICRALBCREAT NOTE 12/28/2019    A/P: Diabetes currently controlled with a most recent A1c of 6.9% on 06/03/23. Reported fasting BG readings are at goal around 110 mg/dL. [employer] will no longer be offering Ozempic  via PAP for Medicare beneficiaries.It appears that reported income exceeds threshold for [employer] PAP. She reports her husband's insurance is through the [place] - discussed that she may be eligible for [employer] or [employer]. Patient has about 1 month left of Ozempic  left at home.  -Continued GLP-1 Ozempic  (semaglutide ) 0.5 mg weekly. Refill sent to pharmacy to check on cost -Provided PAP documents for Trulicity to submit if Ozempic  is not feasible, however, unlikely to be accepted based on reported income -Counseled to have husband check on spouse benefits during his meeting with [facility] tomorrow on his insurance benefits. -Extensively discussed pathophysiology of diabetes, recommended lifestyle interventions, dietary effects on blood sugar control.   ASCVD risk - secondary prevention in patient with diabetes. Last LDL is 46 mg/dL, at goal of <44 mg/dL.  -Continued rosuvastatin  10 mg daily.  -Continued ezetimibe  10 mg daily   Patient verbalized understanding of treatment plan. Total time patient counseling 30 minutes.  Follow-up:  Pharmacist on 08/26/24 PCP clinic visit on 09/16/24  Peyton CHARLENA Ferries, PharmD Clinical Pharmacist [facility] [phone]   "

## 2024-07-25 ENCOUNTER — Encounter: Payer: Self-pay | Admitting: Nurse Practitioner

## 2024-07-28 ENCOUNTER — Other Ambulatory Visit: Payer: Self-pay | Admitting: Cardiovascular Disease

## 2024-07-28 DIAGNOSIS — E785 Hyperlipidemia, unspecified: Secondary | ICD-10-CM

## 2024-07-28 DIAGNOSIS — I700 Atherosclerosis of aorta: Secondary | ICD-10-CM

## 2024-07-30 NOTE — Telephone Encounter (Signed)
 Patient need make an overdue appointment for further refills. Thank

## 2024-08-01 ENCOUNTER — Other Ambulatory Visit: Payer: Self-pay | Admitting: Cardiovascular Disease

## 2024-08-01 DIAGNOSIS — I10 Essential (primary) hypertension: Secondary | ICD-10-CM

## 2024-08-01 DIAGNOSIS — I700 Atherosclerosis of aorta: Secondary | ICD-10-CM

## 2024-08-01 DIAGNOSIS — I5032 Chronic diastolic (congestive) heart failure: Secondary | ICD-10-CM

## 2024-08-03 ENCOUNTER — Ambulatory Visit (INDEPENDENT_AMBULATORY_CARE_PROVIDER_SITE_OTHER)

## 2024-08-03 DIAGNOSIS — E538 Deficiency of other specified B group vitamins: Secondary | ICD-10-CM | POA: Diagnosis not present

## 2024-08-03 MED ORDER — CYANOCOBALAMIN 1000 MCG/ML IJ SOLN
1000.0000 ug | Freq: Once | INTRAMUSCULAR | Status: AC
  Administered 2024-08-03: 1000 ug via INTRAMUSCULAR

## 2024-08-04 NOTE — Telephone Encounter (Signed)
Please contact pt for future appointment. Pt overdue for follow up.

## 2024-08-05 NOTE — Telephone Encounter (Signed)
 Left message on voicemail

## 2024-08-06 ENCOUNTER — Encounter: Payer: Self-pay | Admitting: Nurse Practitioner

## 2024-08-06 ENCOUNTER — Telehealth: Admitting: Nurse Practitioner

## 2024-08-06 DIAGNOSIS — J0140 Acute pansinusitis, unspecified: Secondary | ICD-10-CM | POA: Diagnosis not present

## 2024-08-06 MED ORDER — AMOXICILLIN-POT CLAVULANATE 875-125 MG PO TABS: 1.0000 | ORAL_TABLET | Freq: Two times a day (BID) | ORAL | 0 refills | Status: AC

## 2024-08-06 NOTE — Progress Notes (Signed)
 "  Name: Jacqueline [name] [facility]   MRN: [id number]    DOB: 13-Nov-1950   Date:08/06/2024       Progress Note  Subjective  Chief Complaint  Chief Complaint  Patient presents with   Sinusitis    Check pain chest congestion, onset 1 week    I connected with  Gaylon Melchor [facility]  on 08/06/24 at 11:20 AM EST by a video enabled telemedicine application and verified that I am speaking with the correct person using two identifiers.  I discussed the limitations of evaluation and management by telemedicine and the availability of in person appointments. The patient expressed understanding and agreed to proceed with a virtual visit  Staff also discussed with the patient that there may be a patient responsible charge related to this service. Patient Location: home Provider Location: [facility] Additional Individuals present: np student  HPI   Discussed the use of AI scribe software for clinical note transcription with the patient, who gave verbal consent to proceed.  History of Present Illness Jacqueline [name] is a 74 year old female who presents with symptoms of a sinus infection.  Facial pain and swelling - Pain localized to the cheek and behind the right eye - Facial pain has worsened over the past two days - Noticeable puffiness on one side of the face  Nasal and chest congestion - Persistent nasal congestion for over one week - Chest congestion present - Symptoms initially began as a presumed cold  Fever - Fever occurred on either Monday or Tuesday night  Covid-19 testing - Home COVID test performed with negative result  Symptomatic treatment - Mucinex taken with some relief of symptoms  Antibiotic tolerance - No history of yeast infections when taking antibiotics    Patient Active Problem List   Diagnosis Date Noted   Diarrhea 04/16/2023   Polyp of colon 04/16/2023   Aortic atherosclerosis 12/05/2022   Chronic diastolic heart failure (HCC) 10/12/2021   Paroxysmal tachycardia  (HCC) 10/12/2021   Vitamin D  deficiency 10/12/2021   DM (diabetes mellitus), type 2 (HCC) 10/12/2021   Peripheral edema 12/28/2019   Neuropathy 11/23/2019   B12 deficiency 09/23/2019   Stage 3a chronic kidney disease (HCC) 09/23/2019   History of kidney stones 10/14/2018   Spinal stenosis of lumbar region 08/11/2009   Essential hypertension 01/14/2008   HLD (hyperlipidemia) 11/11/2006   Osteopenia 11/11/2006    Social History   Tobacco Use   Smoking status: Former    Current packs/day: 0.00    Average packs/day: 0.3 packs/day for 30.9 years (7.7 ttl pk-yrs)    Types: Cigarettes    Start date: 07/09/1983    Quit date: 05/27/2014    Years since quitting: 10.2   Smokeless tobacco: Never  Substance Use Topics   Alcohol use: Yes    Alcohol/week: 4.0 standard drinks of alcohol    Types: 4 Glasses of wine per week    Comment: occasional    Current Medications[1]  Allergies[2]  I personally reviewed active problem list, medication list, allergies with the patient/caregiver today.  ROS  Ten systems reviewed and is negative except as mentioned in HPI   Objective  Virtual encounter, vitals not obtained.  There is no height or weight on file to calculate BMI.  Nursing Note and Vital Signs reviewed.  Physical Exam  Awake, alert and oriented, speaking in complete sentences   No results found for this or any previous visit (from the past 72 hours).  Assessment & Plan  Assessment and  Plan Assessment & Plan Acute pansinusitis Pain localized to the right cheek and behind the right eye, nasal and chest congestion, and fever. Symptoms have persisted for over a week. Negative COVID test. No history of yeast infections with antibiotic use. - Prescribed antibiotic augmentin  to be taken twice daily for ten days. - Advised to drink plenty of water to thin mucus. - Recommended avoiding dairy to prevent mucus thickening. - Continue Mucinex twice daily to aid mucus clearance. -  Take an allergy pill such as Zyrtec or Claritin to dry up sinuses. - Use Flonase  nasal spray to open sinuses and prevent mucus pockets. - Instructed to send a message on Monday to report progress.      -Red flags and when to present for emergency care or RTC including fever >101.24F, chest pain, shortness of breath, new/worsening/un-resolving symptoms,  reviewed with patient at time of visit. Follow up and care instructions discussed and provided in AVS. - I discussed the assessment and treatment plan with the patient. The patient was provided an opportunity to ask questions and all were answered. The patient agreed with the plan and demonstrated an understanding of the instructions.  I provided 15 minutes of non-face-to-face time during this encounter.  Mliss JULIANNA Spray, FNP      [1]  Current Outpatient Medications:    amoxicillin -clavulanate (AUGMENTIN ) 875-125 MG tablet, Take 1 tablet by mouth 2 (two) times daily., Disp: 20 tablet, Rfl: 0   aspirin 81 MG chewable tablet, Chew 81 mg by mouth daily., Disp: , Rfl:    blood glucose meter kit and supplies KIT, Lifescan meter Check blood sugar 2 times daily as needed, Disp: 1 each, Rfl: 0   calcium  carbonate (OS-CAL) 600 MG TABS tablet, Take 600 mg by mouth 2 (two) times daily with a meal., Disp: 30 tablet, Rfl: 0   Cholecalciferol (VITAMIN D ) 50 MCG (2000 UT) CAPS, Take 1 capsule by mouth daily., Disp: 30 capsule, Rfl:    ezetimibe  (ZETIA ) 10 MG tablet, Take 1 tablet by mouth once daily, Disp: 30 tablet, Rfl: 0   gabapentin  (NEURONTIN ) 300 MG capsule, Take 1 tablet in the AM and 2 tablets in the evening, Disp: 90 capsule, Rfl: 11   Lancets (ONETOUCH DELICA PLUS LANCET33G) MISC, USE AS DIRECTED TWICE A DAY AS NEEDED, Disp: 200 each, Rfl: 0   lisinopril  (ZESTRIL ) 10 MG tablet, TAKE 1 TABLET BY MOUTH EVERY DAY, Disp: 90 tablet, Rfl: 1   Magnesium Oxide 500 MG CAPS, Take 1 capsule by mouth daily., Disp: , Rfl:    metoprolol  tartrate (LOPRESSOR )  25 MG tablet, TAKE 1 TABLET BY MOUTH TWICE A DAY, Disp: 60 tablet, Rfl: 0   ONETOUCH ULTRA test strip, USE AS DIRECTED TWICE A DAY AS NEEDED, Disp: 200 strip, Rfl: 1   Potassium Gluconate 595 MG CAPS, Take 595 mg by mouth daily., Disp: , Rfl:    predniSONE  (STERAPRED UNI-PAK 21 TAB) 10 MG (21) TBPK tablet, Use as directed., Disp: 21 each, Rfl: 0   rosuvastatin  (CRESTOR ) 10 MG tablet, TAKE 1 TABLET BY MOUTH EVERY DAY, Disp: 90 tablet, Rfl: 0   Semaglutide ,0.25 or 0.5MG /DOS, (OZEMPIC , 0.25 OR 0.5 MG/DOSE,) 2 MG/3ML SOPN, Inject 0.5 mg into the skin once a week., Disp: 3 mL, Rfl: 2   traZODone  (DESYREL ) 50 MG tablet, Take 1 tablet (50 mg total) by mouth at bedtime., Disp: 90 tablet, Rfl: 0   VITAMIN E PO, Take 800 Units by mouth daily., Disp: , Rfl:  [2] No Known Allergies  "

## 2024-08-16 ENCOUNTER — Ambulatory Visit: Admitting: Cardiovascular Disease

## 2024-08-26 ENCOUNTER — Ambulatory Visit: Payer: Medicare HMO

## 2024-08-26 ENCOUNTER — Ambulatory Visit

## 2024-09-02 ENCOUNTER — Ambulatory Visit

## 2024-09-16 ENCOUNTER — Ambulatory Visit: Admitting: Nurse Practitioner

## 2024-12-21 ENCOUNTER — Encounter: Admitting: Dermatology
